# Patient Record
Sex: Male | Born: 1940 | Race: White | Hispanic: No | Smoking: Never smoker
Health system: Southern US, Community
[De-identification: ages and names within clinical notes are randomized; demographics above are authoritative.]

## PROBLEM LIST (undated history)

## (undated) ENCOUNTER — Ambulatory Visit: Admission: EM | Payer: Medicare HMO | Source: Home / Self Care

## (undated) DIAGNOSIS — I714 Abdominal aortic aneurysm, without rupture, unspecified: Secondary | ICD-10-CM

## (undated) DIAGNOSIS — L409 Psoriasis, unspecified: Secondary | ICD-10-CM

## (undated) DIAGNOSIS — IMO0001 Reserved for inherently not codable concepts without codable children: Secondary | ICD-10-CM

## (undated) DIAGNOSIS — F32A Depression, unspecified: Secondary | ICD-10-CM

## (undated) DIAGNOSIS — I499 Cardiac arrhythmia, unspecified: Secondary | ICD-10-CM

## (undated) DIAGNOSIS — I10 Essential (primary) hypertension: Secondary | ICD-10-CM

## (undated) DIAGNOSIS — Z87442 Personal history of urinary calculi: Secondary | ICD-10-CM

## (undated) DIAGNOSIS — E785 Hyperlipidemia, unspecified: Secondary | ICD-10-CM

## (undated) DIAGNOSIS — E538 Deficiency of other specified B group vitamins: Secondary | ICD-10-CM

## (undated) DIAGNOSIS — I77810 Thoracic aortic ectasia: Secondary | ICD-10-CM

## (undated) DIAGNOSIS — R0789 Other chest pain: Secondary | ICD-10-CM

## (undated) DIAGNOSIS — I4819 Other persistent atrial fibrillation: Secondary | ICD-10-CM

## (undated) DIAGNOSIS — I639 Cerebral infarction, unspecified: Secondary | ICD-10-CM

## (undated) DIAGNOSIS — I5023 Acute on chronic systolic (congestive) heart failure: Secondary | ICD-10-CM

## (undated) DIAGNOSIS — N210 Calculus in bladder: Secondary | ICD-10-CM

## (undated) DIAGNOSIS — K219 Gastro-esophageal reflux disease without esophagitis: Secondary | ICD-10-CM

## (undated) DIAGNOSIS — I428 Other cardiomyopathies: Secondary | ICD-10-CM

## (undated) DIAGNOSIS — R972 Elevated prostate specific antigen [PSA]: Secondary | ICD-10-CM

## (undated) DIAGNOSIS — G4730 Sleep apnea, unspecified: Secondary | ICD-10-CM

## (undated) DIAGNOSIS — N411 Chronic prostatitis: Secondary | ICD-10-CM

## (undated) DIAGNOSIS — N138 Other obstructive and reflux uropathy: Secondary | ICD-10-CM

## (undated) DIAGNOSIS — Z7901 Long term (current) use of anticoagulants: Secondary | ICD-10-CM

## (undated) DIAGNOSIS — N401 Enlarged prostate with lower urinary tract symptoms: Secondary | ICD-10-CM

## (undated) DIAGNOSIS — I700 Atherosclerosis of aorta: Secondary | ICD-10-CM

## (undated) DIAGNOSIS — I341 Nonrheumatic mitral (valve) prolapse: Secondary | ICD-10-CM

## (undated) DIAGNOSIS — F329 Major depressive disorder, single episode, unspecified: Secondary | ICD-10-CM

## (undated) DIAGNOSIS — I7143 Infrarenal abdominal aortic aneurysm, without rupture: Secondary | ICD-10-CM

## (undated) DIAGNOSIS — I2510 Atherosclerotic heart disease of native coronary artery without angina pectoris: Secondary | ICD-10-CM

## (undated) HISTORY — PX: HERNIA REPAIR: SHX51

## (undated) HISTORY — DX: Other persistent atrial fibrillation: I48.19

## (undated) HISTORY — DX: Gastro-esophageal reflux disease without esophagitis: K21.9

## (undated) HISTORY — PX: UMBILICAL HERNIA REPAIR: SHX196

## (undated) HISTORY — DX: Essential (primary) hypertension: I10

## (undated) HISTORY — DX: Psoriasis, unspecified: L40.9

## (undated) HISTORY — DX: Hyperlipidemia, unspecified: E78.5

## (undated) HISTORY — DX: Cerebral infarction, unspecified: I63.9

## (undated) HISTORY — DX: Deficiency of other specified B group vitamins: E53.8

## (undated) HISTORY — DX: Reserved for inherently not codable concepts without codable children: IMO0001

## (undated) HISTORY — DX: Elevated prostate specific antigen (PSA): R97.20

## (undated) HISTORY — PX: COLONOSCOPY WITH PROPOFOL: SHX5780

## (undated) HISTORY — PX: CATARACT EXTRACTION, BILATERAL: SHX1313

## (undated) HISTORY — DX: Other chest pain: R07.89

## (undated) HISTORY — DX: Nonrheumatic mitral (valve) prolapse: I34.1

## (undated) HISTORY — PX: TONSILLECTOMY: SUR1361

## (undated) HISTORY — PX: VARICOSE VEIN SURGERY: SHX832

---
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
## 1973-08-26 DIAGNOSIS — N400 Enlarged prostate without lower urinary tract symptoms: Secondary | ICD-10-CM | POA: Insufficient documentation

## 1973-08-26 DIAGNOSIS — I499 Cardiac arrhythmia, unspecified: Secondary | ICD-10-CM | POA: Insufficient documentation

## 2003-08-27 DIAGNOSIS — G4730 Sleep apnea, unspecified: Secondary | ICD-10-CM | POA: Insufficient documentation

## 2004-08-24 ENCOUNTER — Ambulatory Visit: Payer: Self-pay | Admitting: Gastroenterology

## 2006-07-13 ENCOUNTER — Emergency Department: Payer: Self-pay | Admitting: Emergency Medicine

## 2006-07-13 ENCOUNTER — Other Ambulatory Visit: Payer: Self-pay

## 2007-03-29 ENCOUNTER — Ambulatory Visit: Payer: Self-pay | Admitting: Ophthalmology

## 2007-03-29 ENCOUNTER — Other Ambulatory Visit: Payer: Self-pay

## 2008-07-03 ENCOUNTER — Inpatient Hospital Stay: Payer: Self-pay | Admitting: Rheumatology

## 2008-07-03 DIAGNOSIS — I6389 Other cerebral infarction: Secondary | ICD-10-CM

## 2008-07-03 HISTORY — DX: Other cerebral infarction: I63.89

## 2009-12-06 ENCOUNTER — Ambulatory Visit: Payer: Self-pay | Admitting: Urology

## 2010-01-18 ENCOUNTER — Ambulatory Visit: Payer: Self-pay | Admitting: Urology

## 2012-03-06 DIAGNOSIS — R3129 Other microscopic hematuria: Secondary | ICD-10-CM | POA: Insufficient documentation

## 2012-03-06 DIAGNOSIS — N200 Calculus of kidney: Secondary | ICD-10-CM | POA: Insufficient documentation

## 2012-03-06 DIAGNOSIS — R972 Elevated prostate specific antigen [PSA]: Secondary | ICD-10-CM | POA: Insufficient documentation

## 2012-03-06 DIAGNOSIS — N411 Chronic prostatitis: Secondary | ICD-10-CM | POA: Insufficient documentation

## 2012-03-06 DIAGNOSIS — N210 Calculus in bladder: Secondary | ICD-10-CM | POA: Insufficient documentation

## 2012-03-06 DIAGNOSIS — N401 Enlarged prostate with lower urinary tract symptoms: Secondary | ICD-10-CM | POA: Insufficient documentation

## 2012-03-06 DIAGNOSIS — R339 Retention of urine, unspecified: Secondary | ICD-10-CM | POA: Insufficient documentation

## 2012-06-12 ENCOUNTER — Ambulatory Visit: Payer: Self-pay | Admitting: Gastroenterology

## 2013-05-05 ENCOUNTER — Ambulatory Visit: Payer: Self-pay | Admitting: Cardiovascular Disease

## 2013-05-06 DIAGNOSIS — I4821 Permanent atrial fibrillation: Secondary | ICD-10-CM | POA: Insufficient documentation

## 2013-05-06 DIAGNOSIS — I639 Cerebral infarction, unspecified: Secondary | ICD-10-CM | POA: Insufficient documentation

## 2013-05-06 DIAGNOSIS — Z8673 Personal history of transient ischemic attack (TIA), and cerebral infarction without residual deficits: Secondary | ICD-10-CM | POA: Insufficient documentation

## 2013-05-08 ENCOUNTER — Encounter: Payer: Self-pay | Admitting: Cardiovascular Disease

## 2013-05-08 ENCOUNTER — Encounter (INDEPENDENT_AMBULATORY_CARE_PROVIDER_SITE_OTHER): Payer: Self-pay

## 2013-05-08 ENCOUNTER — Ambulatory Visit (INDEPENDENT_AMBULATORY_CARE_PROVIDER_SITE_OTHER): Payer: Medicare PPO | Admitting: Cardiovascular Disease

## 2013-05-08 VITALS — BP 118/70 | HR 94 | Ht 74.0 in | Wt 195.5 lb

## 2013-05-08 DIAGNOSIS — I6350 Cerebral infarction due to unspecified occlusion or stenosis of unspecified cerebral artery: Secondary | ICD-10-CM

## 2013-05-08 DIAGNOSIS — I10 Essential (primary) hypertension: Secondary | ICD-10-CM

## 2013-05-08 DIAGNOSIS — R0602 Shortness of breath: Secondary | ICD-10-CM

## 2013-05-08 DIAGNOSIS — I639 Cerebral infarction, unspecified: Secondary | ICD-10-CM

## 2013-05-08 DIAGNOSIS — E785 Hyperlipidemia, unspecified: Secondary | ICD-10-CM | POA: Insufficient documentation

## 2013-05-08 DIAGNOSIS — I4891 Unspecified atrial fibrillation: Secondary | ICD-10-CM

## 2013-05-08 MED ORDER — DILTIAZEM HCL ER COATED BEADS 120 MG PO CP24: 120.0000 mg | ORAL_CAPSULE | Freq: Every day | ORAL | Status: DC

## 2013-05-08 NOTE — Assessment & Plan Note (Addendum)
I'm concerned about poor heart rate control, particularly with exertion. Prior treadmill documenting peak heart rate in the low 200 range. He has shortness of breath with heavy exertion. He does not like atenolol. We have recommended he stop atenolol, start Cardizem 120 mg daily. I hope you will able to tolerate this without hypotension. His heart rate continues to be elevated, could add low-dose digoxin. Suspect he is permanent atrial fibrillation. We have recommended he closely monitor his heart rate and if he is in normal sinus rhythm, would recommend an EKG in our office. He's also interested in changing from xarelto to eliquis. He we'll check the price of the latter.

## 2013-05-08 NOTE — Assessment & Plan Note (Signed)
Most recent lipid panel not available. Encouraged him to stay on his simvastatin

## 2013-05-08 NOTE — Progress Notes (Signed)
Patient ID: Gary Dawson, male    DOB: 12/19/1940, 73 y.o.   MRN: [id number]  HPI Comments: Mr. Kustra is a pleasant 73 year old gentleman with long history of atrial fibrillation dating back to at least 2008, prior small stroke in June 2010, hyperlipidemia, who presents to establish care in the [place] office.   He reports that overall he feels well but is wondering if further medication changes could be made to make him feel better. His biggest complaint is shortness of breath when he exerts himself. He is able to walk quickly or run for 50 feet before he gets short of breath, tired. He feels that his legs are stuck in Jell-O when he exerts himself. Other than that, he reports that he is very active, does more than most people, has a high activity level and is not need much rest to recover. He denies any leg edema, no PND or orthopnea symptoms. He is relatively asymptomatic from his atrial fibrillation and is unable to tell when he is in normal rhythm or atrial fibrillation.  Notes indicate he was in atrial fibrillation in 2008 on EKG, normal sinus rhythm in June 2010, EKG February 2014 showing atrial fibrillation He has been on Xarelto  For least one year.   Treadmill stress echo March 2014 showed no wall motion abnormality concerning for ischemia, good exercise tolerance, peak heart rate of 214 beats per minute ? With peak systolic pressure 191/96. Resting heart rate 98 beats per minute.   Echocardiogram June 2010 showing ejection fraction greater than 55%, mildly dilated left atrium, mild LVH, mild to moderate TR, mild to moderate MR Carotid ultrasound June 2010 showing no significant plaque MRI of the brain June 2010 showing tiny nonhemorrhagic left posterior parietal lobe infarct  EKG today shows atrial fibrillation with rate 90-100 beats per minute, nonspecific ST abnormality, voltage concerning for LVH   Outpatient Encounter Prescriptions as of 05/08/2013  Medication Sig  .  AVODART 0.5 MG capsule Take 0.5 mg by mouth daily.   . Multiple Vitamin (MULTIVITAMIN) tablet Take 1 tablet by mouth daily.  Marland Kitchen omeprazole (PRILOSEC) 40 MG capsule Take 40 mg by mouth daily.  . simvastatin (ZOCOR) 40 MG tablet Take 40 mg by mouth daily at 6 PM.   . tamsulosin (FLOMAX) 0.4 MG CAPS capsule Take 0.4 mg by mouth.  [name] 20 MG TABS tablet Take 20 mg by mouth daily with supper.   Marland Kitchen  atenolol (TENORMIN) 25 MG tablet Take 25 mg by mouth daily.     Review of Systems  Constitutional: Negative.   HENT: Negative.   Eyes: Negative.   Respiratory: Negative.        Shortness of breath with exertion  Cardiovascular: Negative.   Gastrointestinal: Negative.   Endocrine: Negative.   Musculoskeletal: Negative.   Skin: Negative.   Allergic/Immunologic: Negative.   Neurological: Negative.   Hematological: Negative.   Psychiatric/Behavioral: Negative.   All other systems reviewed and are negative.   BP 118/70  Pulse 94  Ht 6\' 2"  (1.88 m)  Wt 195 lb 8 oz (88.678 kg)  BMI 25.09 kg/m2  Physical Exam  Nursing note and vitals reviewed. Constitutional: He is oriented to person, place, and time. He appears well-developed and well-nourished.  HENT:  Head: Normocephalic.  Nose: Nose normal.  Mouth/Throat: Oropharynx is clear and moist.  Eyes: Conjunctivae are normal. Pupils are equal, round, and reactive to light.  Neck: Normal range of motion. Neck supple. No JVD present.  Cardiovascular: S1 normal,  S2 normal, normal heart sounds and intact distal pulses.  An irregularly irregular rhythm present. Tachycardia present.  Exam reveals no gallop and no friction rub.   No murmur heard. Pulmonary/Chest: Effort normal and breath sounds normal. No respiratory distress. He has no wheezes. He has no rales. He exhibits no tenderness.  Abdominal: Soft. Bowel sounds are normal. He exhibits no distension. There is no tenderness.  Musculoskeletal: Normal range of motion. He exhibits no edema and  no tenderness.  Lymphadenopathy:    He has no cervical adenopathy.  Neurological: He is alert and oriented to person, place, and time. Coordination normal.  Skin: Skin is warm and dry. No rash noted. No erythema.  Psychiatric: He has a normal mood and affect. His behavior is normal. Judgment and thought content normal.      Assessment and Plan

## 2013-05-08 NOTE — Assessment & Plan Note (Addendum)
Recommended he closely monitor his blood pressure on Cardizem 120 mg daily. He will hold the atenolol

## 2013-05-08 NOTE — Patient Instructions (Addendum)
You are doing well.  Please check the price of the eliquis 5 mg twice a day Call the office if the price is the same or better  Please hold the atenolol Please start diltiazem 120 mg once a day Monitor your heart rate, call the office it runs high  Please call us if you have new issues that need to be addressed before your next appt.  Your physician wants you to follow-up in: 2 months.

## 2013-05-08 NOTE — Assessment & Plan Note (Signed)
We spent a long time discussing his symptoms of shortness of breath with exertion. Concerned his symptoms could be secondary to poorly controlled rate. We will work on controlling his heart rate first. Unable to exclude atrial fibrillation, the arrhythmia itself,  as a cause of his symptoms. Less likely ischemia. He is a nonsmoker, on cholesterol medication. Of symptoms persist or get worse, ischemia workup could be started.

## 2013-05-08 NOTE — Assessment & Plan Note (Signed)
Prior stroke. I understand he was not on anticoagulation at this time. He is currently on anticoagulation

## 2013-05-09 ENCOUNTER — Encounter: Payer: Self-pay | Admitting: Cardiovascular Disease

## 2013-05-15 ENCOUNTER — Telehealth: Payer: Self-pay

## 2013-05-15 NOTE — Telephone Encounter (Signed)
Left message for pt to call back  °

## 2013-05-15 NOTE — Telephone Encounter (Signed)
Pt has a question regarding his medications please call.

## 2013-05-16 NOTE — Telephone Encounter (Signed)
Message addressed via MyChart.

## 2013-05-22 DIAGNOSIS — E7800 Pure hypercholesterolemia, unspecified: Secondary | ICD-10-CM | POA: Insufficient documentation

## 2013-05-27 ENCOUNTER — Encounter: Payer: Self-pay | Admitting: Cardiovascular Disease

## 2013-07-09 ENCOUNTER — Ambulatory Visit (INDEPENDENT_AMBULATORY_CARE_PROVIDER_SITE_OTHER): Payer: Medicare PPO | Admitting: Cardiovascular Disease

## 2013-07-09 ENCOUNTER — Encounter: Payer: Self-pay | Admitting: Cardiovascular Disease

## 2013-07-09 VITALS — BP 122/80 | HR 111 | Ht 74.0 in | Wt 194.2 lb

## 2013-07-09 DIAGNOSIS — I10 Essential (primary) hypertension: Secondary | ICD-10-CM

## 2013-07-09 DIAGNOSIS — E785 Hyperlipidemia, unspecified: Secondary | ICD-10-CM

## 2013-07-09 DIAGNOSIS — I4891 Unspecified atrial fibrillation: Secondary | ICD-10-CM

## 2013-07-09 NOTE — Assessment & Plan Note (Signed)
Blood pressure is well controlled on today's visit. No changes made to the medications. 

## 2013-07-09 NOTE — Progress Notes (Signed)
Patient ID: Gary Dawson, male    DOB: Jul 06, 1940, 73 y.o.   MRN: [id number]  HPI Comments: [name] is a pleasant 73 year old gentleman with long history of atrial fibrillation dating back to at least 2008, prior small stroke in June 2010, hyperlipidemia, who presents  for routine followup.  In general he reports that he is doing very well. On his last clinic visit, we held the atenolol secondary to side effects and started diltiazem 120 mg daily. On this regimen he has felt well. Heart rate at home typically 70-80 beats per minute at rest. Blood pressure typically running 120-130 systolic. He has good energy, prior symptoms of fatigue with atenolol have resolved. He is starting to workout again. 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 Tolerating anticoagulation  Notes indicate he was in atrial fibrillation in 2008 on EKG, normal sinus rhythm in June 2010, EKG February 2014 showing atrial fibrillation He has been on Xarelto  For least one year.   Treadmill stress echo March 2014 showed no wall motion abnormality concerning for ischemia, good exercise tolerance, peak heart rate of 214 beats per minute ? With peak systolic pressure 191/96. Resting heart rate 98 beats per minute.   Echocardiogram June 2010 showing ejection fraction greater than 55%, mildly dilated left atrium, mild LVH, mild to moderate TR, mild to moderate MR Carotid ultrasound June 2010 showing no significant plaque MRI of the brain June 2010 showing tiny nonhemorrhagic left posterior parietal lobe infarct  EKG today shows atrial fibrillation with rate 111 eats per minute, no significant ST or T wave changes Heart rate did improve on clinical exam likely 80-90   Outpatient Encounter Prescriptions as of 07/09/2013  Medication Sig  . AVODART 0.5 MG capsule Take 0.5 mg by mouth daily.   Marland Kitchen. diltiazem (CARDIZEM CD) 120 MG 24 hr capsule Take 1 capsule (120 mg total) by mouth daily.  . Multiple Vitamin (MULTIVITAMIN) tablet Take  1 tablet by mouth daily.  . OLOPATADINE HCL OP Apply 665 mcg to eye as needed.  Marland Kitchen. omeprazole (PRILOSEC) 40 MG capsule Take 40 mg by mouth daily.  . simvastatin (ZOCOR) 40 MG tablet Take 40 mg by mouth daily at 6 PM.   . tamsulosin (FLOMAX) 0.4 MG CAPS capsule Take 0.4 mg by mouth.  [name]. XARELTO 20 MG TABS tablet Take 20 mg by mouth daily with supper.     Review of Systems  Constitutional: Negative.   HENT: Negative.   Eyes: Negative.   Respiratory: Negative.        Shortness of breath with exertion  Cardiovascular: Negative.   Gastrointestinal: Negative.   Endocrine: Negative.   Musculoskeletal: Negative.   Skin: Negative.   Allergic/Immunologic: Negative.   Neurological: Negative.   Hematological: Negative.   Psychiatric/Behavioral: Negative.   All other systems reviewed and are negative.   BP 122/80  Pulse 111  Ht 6\' 2"  (1.88 m)  Wt 194 lb 4 oz (88.111 kg)  BMI 24.93 kg/m2  Physical Exam  Nursing note and vitals reviewed. Constitutional: He is oriented to person, place, and time. He appears well-developed and well-nourished.  HENT:  Head: Normocephalic.  Nose: Nose normal.  Mouth/Throat: Oropharynx is clear and moist.  Eyes: Conjunctivae are normal. Pupils are equal, round, and reactive to light.  Neck: Normal range of motion. Neck supple. No JVD present.  Cardiovascular: S1 normal, S2 normal, normal heart sounds and intact distal pulses.  An irregularly irregular rhythm present. Tachycardia present.  Exam reveals no gallop  and no friction rub.   No murmur heard. Pulmonary/Chest: Effort normal and breath sounds normal. No respiratory distress. He has no wheezes. He has no rales. He exhibits no tenderness.  Abdominal: Soft. Bowel sounds are normal. He exhibits no distension. There is no tenderness.  Musculoskeletal: Normal range of motion. He exhibits no edema and no tenderness.  Lymphadenopathy:    He has no cervical adenopathy.  Neurological: He is alert and oriented  to person, place, and time. Coordination normal.  Skin: Skin is warm and dry. No rash noted. No erythema.  Psychiatric: He has a normal mood and affect. His behavior is normal. Judgment and thought content normal.      Assessment and Plan

## 2013-07-09 NOTE — Assessment & Plan Note (Signed)
Continues to be in chronic atrial fibrillation, rate reasonably well controlled. No medication changes made as he is happy with his activity level, heart rate at rest and with exertion. Diltiazem 30 mg pills were offered to him with exertion, he has declined

## 2013-07-09 NOTE — Assessment & Plan Note (Signed)
Encouraged him to stay on his simvastatin. 

## 2013-07-09 NOTE — Patient Instructions (Signed)
You are doing well. No medication changes were made.  Please call us if you have new issues that need to be addressed before your next appt.  Your physician wants you to follow-up in: 12 months.  You will receive a reminder letter in the mail two months in advance. If you don't receive a letter, please call our office to schedule the follow-up appointment. 

## 2013-11-04 DIAGNOSIS — D075 Carcinoma in situ of prostate: Secondary | ICD-10-CM | POA: Insufficient documentation

## 2013-11-04 DIAGNOSIS — G479 Sleep disorder, unspecified: Secondary | ICD-10-CM | POA: Insufficient documentation

## 2013-11-04 DIAGNOSIS — R350 Frequency of micturition: Secondary | ICD-10-CM | POA: Insufficient documentation

## 2013-11-21 ENCOUNTER — Ambulatory Visit: Payer: Self-pay | Admitting: Otolaryngology

## 2013-12-03 ENCOUNTER — Ambulatory Visit: Payer: Self-pay | Admitting: Otolaryngology

## 2014-03-12 ENCOUNTER — Other Ambulatory Visit: Payer: Self-pay | Admitting: *Deleted

## 2014-03-12 MED ORDER — RIVAROXABAN 20 MG PO TABS: 20.0000 mg | ORAL_TABLET | Freq: Every day | ORAL | Status: DC

## 2014-03-12 NOTE — Telephone Encounter (Signed)
[facility] w/ 90day supply

## 2014-03-20 ENCOUNTER — Other Ambulatory Visit: Payer: Self-pay

## 2014-03-20 MED ORDER — DILTIAZEM HCL ER COATED BEADS 120 MG PO CP24: 120.0000 mg | ORAL_CAPSULE | Freq: Every day | ORAL | Status: DC

## 2014-04-18 ENCOUNTER — Ambulatory Visit
Admit: 2014-04-18 | Disposition: A | Discharge status: Home / Self Care | Payer: Self-pay | Attending: Internal Medicine | Admitting: Internal Medicine

## 2014-06-14 ENCOUNTER — Other Ambulatory Visit: Payer: Self-pay | Admitting: Cardiovascular Disease

## 2014-06-18 MED ORDER — RIVAROXABAN 20 MG PO TABS: 20.0000 mg | ORAL_TABLET | Freq: Every day | ORAL | Status: DC

## 2014-06-18 NOTE — Addendum Note (Signed)
Addended by: Festus Aloe on: 06/18/2014 01:24 PM   Modules accepted: Orders

## 2014-06-18 NOTE — Telephone Encounter (Signed)
Refill sent for xarelto 20 mg for 90 day supply; patient has made a follow up appointment for June 2016.

## 2014-07-08 ENCOUNTER — Ambulatory Visit (INDEPENDENT_AMBULATORY_CARE_PROVIDER_SITE_OTHER): Payer: Medicare PPO | Admitting: Cardiovascular Disease

## 2014-07-08 ENCOUNTER — Encounter: Payer: Self-pay | Admitting: Cardiovascular Disease

## 2014-07-08 VITALS — BP 110/64 | HR 117 | Ht 74.0 in | Wt 198.5 lb

## 2014-07-08 DIAGNOSIS — I10 Essential (primary) hypertension: Secondary | ICD-10-CM

## 2014-07-08 DIAGNOSIS — M542 Cervicalgia: Secondary | ICD-10-CM | POA: Diagnosis not present

## 2014-07-08 DIAGNOSIS — I482 Chronic atrial fibrillation, unspecified: Secondary | ICD-10-CM

## 2014-07-08 NOTE — Assessment & Plan Note (Signed)
Blood pressure is well controlled on today's visit. No changes made to the medications. 

## 2014-07-08 NOTE — Assessment & Plan Note (Signed)
Elevated heart rate. Suggested he monitor his heart rate at home and call our office if this continues to run high He reports heart rate much improved at home compared to numbers today

## 2014-07-08 NOTE — Patient Instructions (Signed)
You are doing well. No medication changes were made.  Please call us if you have new issues that need to be addressed before your next appt.  Your physician wants you to follow-up in: 12 months.  You will receive a reminder letter in the mail two months in advance. If you don't receive a letter, please call our office to schedule the follow-up appointment. 

## 2014-07-08 NOTE — Progress Notes (Signed)
Patient ID: Gary Dawson, male    DOB: 1940/11/18, 74 y.o.   MRN: [id number]  HPI Comments: Gary Dawson is a pleasant 74 year old gentleman with long history of atrial fibrillation dating back to at least 2008, prior small stroke in June 2010, hyperlipidemia, who presents  for routine followup of his atrial fibrillation  In general he reports that he is doing very well.  Tolerating anticoagulation, taking diltiazem. No longer taking a statin. Prior cholesterol in 2015 was 100 Reports heart rate is well controlled at home.  Otherwise no other complaints  EKG on today's visit shows atrial fibrillation with ventricular rate 117 bpm, nonspecific ST abnormality  Other past medical history  in atrial fibrillation in 2008 on EKG, normal sinus rhythm in June 2010, EKG February 2014 showing atrial fibrillation  Treadmill stress echo March 2014 showed no wall motion abnormality concerning for ischemia, good exercise tolerance, peak heart rate of 214 beats per minute ? With peak systolic pressure 191/96. Resting heart rate 98 beats per minute.   Echocardiogram June 2010 showing ejection fraction greater than 55%, mildly dilated left atrium, mild LVH, mild to moderate TR, mild to moderate MR Carotid ultrasound June 2010 showing no significant plaque MRI of the brain June 2010 showing tiny nonhemorrhagic left posterior parietal lobe infarct  No Known Allergies  Current Outpatient Prescriptions on File Prior to Visit  Medication Sig Dispense Refill  . AVODART 0.5 MG capsule Take 0.5 mg by mouth daily.     Marland Kitchen diltiazem (CARDIZEM CD) 120 MG 24 hr capsule Take 1 capsule (120 mg total) by mouth daily. 90 capsule 3  . Multiple Vitamin (MULTIVITAMIN) tablet Take 1 tablet by mouth daily.    . OLOPATADINE HCL OP Apply 665 mcg to eye as needed.    . rivaroxaban (XARELTO) 20 MG TABS tablet Take 1 tablet (20 mg total) by mouth daily with supper. 90 tablet 0  . tamsulosin (FLOMAX) 0.4 MG CAPS capsule  Take 0.4 mg by mouth.     No current facility-administered medications on file prior to visit.    Past Medical History  Diagnosis Date  . Intermittent atrial fibrillation   . Psoriasis   . MVP (mitral valve prolapse)   . Reflux   . CVA (cerebral vascular accident)   . Hyperlipidemia   . Hypertension   . Vitamin B 12 deficiency   . Elevated PSA     Past Surgical History  Procedure Laterality Date  . Varicose vein surgery      Social History  reports that he has never smoked. He does not have any smokeless tobacco history on file. He reports that he drinks alcohol. He reports that he does not use illicit drugs.  Family History family history includes Heart attack in his father; Hyperlipidemia in his father; Hypertension in his father.     Review of Systems  Constitutional: Negative.   Respiratory: Negative.        Shortness of breath with exertion  Cardiovascular: Negative.   Gastrointestinal: Negative.   Musculoskeletal: Negative.   Skin: Negative.   Neurological: Negative.   Hematological: Negative.   Psychiatric/Behavioral: Negative.   All other systems reviewed and are negative.   BP 110/64 mmHg  Pulse 117  Ht 6\' 2"  (1.88 m)  Wt 198 lb 8 oz (90.039 kg)  BMI 25.48 kg/m2  Physical Exam  Constitutional: He is oriented to person, place, and time. He appears well-developed and well-nourished.  HENT:  Head: Normocephalic.  Nose: Nose normal.  Mouth/Throat: Oropharynx is clear and moist.  Eyes: Conjunctivae are normal. Pupils are equal, round, and reactive to light.  Neck: Normal range of motion. Neck supple. No JVD present.  Cardiovascular: S1 normal, S2 normal, normal heart sounds and intact distal pulses.  An irregularly irregular rhythm present. Tachycardia present.  Exam reveals no gallop and no friction rub.   No murmur heard. Pulmonary/Chest: Effort normal and breath sounds normal. No respiratory distress. He has no wheezes. He has no rales. He exhibits  no tenderness.  Abdominal: Soft. Bowel sounds are normal. He exhibits no distension. There is no tenderness.  Musculoskeletal: Normal range of motion. He exhibits no edema or tenderness.  Lymphadenopathy:    He has no cervical adenopathy.  Neurological: He is alert and oriented to person, place, and time. Coordination normal.  Skin: Skin is warm and dry. No rash noted. No erythema.  Psychiatric: He has a normal mood and affect. His behavior is normal. Judgment and thought content normal.      Assessment and Plan   Nursing note and vitals reviewed.

## 2014-09-01 ENCOUNTER — Other Ambulatory Visit: Payer: Self-pay

## 2014-09-01 MED ORDER — RIVAROXABAN 20 MG PO TABS: 20.0000 mg | ORAL_TABLET | Freq: Every day | ORAL | Status: DC

## 2014-09-01 NOTE — Telephone Encounter (Signed)
90 day supply

## 2014-10-30 ENCOUNTER — Encounter: Payer: Self-pay | Admitting: Nurse Practitioner

## 2014-10-30 ENCOUNTER — Telehealth: Payer: Self-pay | Admitting: Cardiovascular Disease

## 2014-10-30 ENCOUNTER — Ambulatory Visit (INDEPENDENT_AMBULATORY_CARE_PROVIDER_SITE_OTHER): Payer: Medicare PPO | Admitting: Nurse Practitioner

## 2014-10-30 VITALS — BP 100/58 | HR 119 | Ht 74.0 in | Wt 195.5 lb

## 2014-10-30 DIAGNOSIS — I481 Persistent atrial fibrillation: Secondary | ICD-10-CM | POA: Diagnosis not present

## 2014-10-30 DIAGNOSIS — I482 Chronic atrial fibrillation: Secondary | ICD-10-CM | POA: Diagnosis not present

## 2014-10-30 DIAGNOSIS — I4819 Other persistent atrial fibrillation: Secondary | ICD-10-CM

## 2014-10-30 DIAGNOSIS — R079 Chest pain, unspecified: Secondary | ICD-10-CM | POA: Diagnosis not present

## 2014-10-30 MED ORDER — DILTIAZEM HCL ER COATED BEADS 180 MG PO CP24
180.0000 mg | ORAL_CAPSULE | Freq: Every day | ORAL | Status: DC
Start: 2014-10-30 — End: 2014-12-17

## 2014-10-30 MED ORDER — DILTIAZEM HCL ER COATED BEADS 180 MG PO CP24: 180.0000 mg | ORAL_CAPSULE | Freq: Every day | ORAL | Status: DC

## 2014-10-30 NOTE — Telephone Encounter (Signed)
° °  1. Are you having CP right now? Yes, no sharpe pain  Feels Tightness just above left breast   2. Are you experiencing any other symptomsNo   3. How long have you been experiencing CP?  Start over the last few weeks - ? Cause got off medication routine cant get back on track   4. Is your CP continuous or coming and going?  Comes and goes   5. Have you taken Nitroglycerin?   No but relieved or subsides some with asa  ?

## 2014-10-30 NOTE — Telephone Encounter (Signed)
Gary Dawson has an opening.  Patient is coming in today at 2 pm.

## 2014-10-30 NOTE — Patient Instructions (Addendum)
Medication Instructions:  Your physician has recommended you make the following change in your medication:  INCREASE diltiazem  once per day   Labwork: none  Testing/Procedures: Your physician has requested that you have a lexiscan myoview. For further information please visit [email or web address] Please follow instruction sheet, as given.  [facility] MYOVIEW  Your caregiver has ordered a Stress Test with nuclear imaging. The purpose of this test is to evaluate the blood supply to your heart muscle. This procedure is referred to as a "Non-Invasive Stress Test." This is because other than having an IV started in your vein, nothing is inserted or "invades" your body. Cardiac stress tests are done to find areas of poor blood flow to the heart by determining the extent of coronary artery disease (CAD). Some patients exercise on a treadmill, which naturally increases the blood flow to your heart, while others who are  unable to walk on a treadmill due to physical limitations have a pharmacologic/chemical stress agent called Lexiscan . This medicine will mimic walking on a treadmill by temporarily increasing your coronary blood flow.   Please note: these test may take anywhere between 2-4 hours to complete  PLEASE REPORT TO [facility] ENTRANCE  THE VOLUNTEERS AT THE FIRST DESK WILL DIRECT YOU WHERE TO GO  Date of Procedure: Friday, October 21, 8:00am Arrival Time for Procedure: 7:45am  Instructions regarding medication: You may take your morning medications with a sip of water.   PLEASE NOTIFY THE OFFICE AT LEAST 24 HOURS IN ADVANCE IF YOU ARE UNABLE TO KEEP YOUR APPOINTMENT.  [phone] AND  PLEASE NOTIFY NUCLEAR MEDICINE AT [facility] AT LEAST 24 HOURS IN ADVANCE IF YOU ARE UNABLE TO KEEP YOUR APPOINTMENT. [phone]  How to prepare for your Myoview test:   Do not eat or drink after midnight  No caffeine for 24 hours prior to test  No smoking 24 hours prior to test.  Your  medication may be taken with water.  If your doctor stopped a medication because of this test, do not take that medication.  Ladies, please do not wear dresses.  Skirts or pants are appropriate. Please wear a short sleeve shirt.  No perfume, cologne or lotion.  Wear comfortable walking shoes. No heels!            Follow-Up: Your physician recommends that you schedule a follow-up appointment in: one month with Eula Listen or Dr. Mariah Milling   Any Other Special Instructions Will Be Listed Below (If Applicable).  Cardiac Nuclear Scanning A cardiac nuclear scan is used to check your heart for problems, such as the following:  A portion of the heart is not getting enough blood.  Part of the heart muscle has died, which happens with a heart attack.  The heart wall is not working normally.  In this test, a radioactive dye (tracer) is injected into your bloodstream. After the tracer has traveled to your heart, a scanning device is used to measure how much of the tracer is absorbed by or distributed to various areas of your heart. LET [facility] CARE PROVIDER KNOW ABOUT:  Any allergies you have.  All medicines you are taking, including vitamins, herbs, eye drops, creams, and over-the-counter medicines.  Previous problems you or members of your family have had with the use of anesthetics.  Any blood disorders you have.  Previous surgeries you have had.  Medical conditions you have.  RISKS AND COMPLICATIONS Generally, this is a safe procedure. However, as with any procedure, problems can  occur. Possible problems include:   Serious chest pain.  Rapid heartbeat.  Sensation of warmth in your chest. This usually passes quickly. BEFORE THE PROCEDURE Ask your health care provider about changing or stopping your regular medicines. PROCEDURE This procedure is usually done at a hospital and takes 2-4 hours.  An IV tube is inserted into one of your veins.  Your health care provider  will inject a small amount of radioactive tracer through the tube.  You will then wait for 20-40 minutes while the tracer travels through your bloodstream.  You will lie down on an exam table so images of your heart can be taken. Images will be taken for about 15-20 minutes.  You will exercise on a treadmill or stationary bike. While you exercise, your heart activity will be monitored with an electrocardiogram (ECG), and your blood pressure will be checked.  If you are unable to exercise, you may be given a medicine to make your heart beat faster.  When blood flow to your heart has peaked, tracer will again be injected through the IV tube.  After 20-40 minutes, you will get back on the exam table and have more images taken of your heart.  When the procedure is over, your IV tube will be removed. AFTER THE PROCEDURE  You will likely be able to leave shortly after the test. Unless your health care provider tells you otherwise, you may return to your normal schedule, including diet, activities, and medicines.  Make sure you find out how and when you will get your test results.   This information is not intended to replace advice given to you by your health care provider. Make sure you discuss any questions you have with your health care provider.   Document Released: 01/28/2004 Document Revised: 01/07/2013 Document Reviewed: 12/11/2012 Elsevier Interactive Patient Education [employer]

## 2014-10-30 NOTE — Progress Notes (Signed)
Patient Name: Gary Dawson Date of Encounter: 10/30/2014  Primary Care Provider:  Clydie Braun, MD Primary Cardiologist:  Concha Se, MD   Chief Complaint  74 y/o male with a h/o persistent afib who presents for f/u related to c/p.  Past Medical History   Past Medical History  Diagnosis Date  . Persistent atrial fibrillation (HCC)     a. CHA2DS2VASc = 4-->chronic Xarelto.  . Psoriasis   . MVP (mitral valve prolapse)   . Reflux   . CVA (cerebral vascular accident) (HCC)   . Hyperlipidemia   . Essential hypertension   . Vitamin B 12 deficiency   . Elevated PSA   . Atypical chest pain     a. 03/2012 St echo: nl EF, no wma's.   Past Surgical History  Procedure Laterality Date  . Varicose vein surgery      Allergies  No Known Allergies  HPI  74 y/o male with a h/o persistent AF on dilt/xarelto.  He does not know what his HR trends @ home but denies palpitations or significant limitations related to his AF.  Over the past month and a half, he has had 3 episodes of mild (3-4/10) left sided chest discomfort associated with mild dyspnea, all occurring @ rest, and lasting a few hours before lessening in severity to 1-2/10.  Discomfort can last @ 1-2/10 for several days.  Despite prolonged symptoms, he is able to remain very active, working outside for hours on end without any worsening of c/p or dyspnea.  He has never had onset of Ss during activity.  His most recent episode occurred about 4 days ago and resolved by the next day.  He has been pain free since, despite remaining active.  He denies pnd, orthopnea, n, v, dizziness, syncope, edema, weight gain, or early satiety.   Home Medications  Prior to Admission medications   Medication Sig Start Date End Date Taking? Authorizing Provider  AVODART 0.5 MG capsule Take 0.5 mg by mouth Gary.  03/21/13  Yes Historical Provider, MD  hydroxypropyl methylcellulose / hypromellose (ISOPTO TEARS / GONIOVISC) 2.5 % ophthalmic  solution 1 drop as needed for dry eyes.   Yes Historical Provider, MD  OLOPATADINE HCL OP Apply 665 mcg to eye as needed.   Yes Historical Provider, MD  rivaroxaban (XARELTO) 20 MG TABS tablet Take 1 tablet (20 mg total) by mouth Gary with supper. 09/01/14  Yes Antonieta Iba, MD  tamsulosin (FLOMAX) 0.4 MG CAPS capsule Take 0.4 mg by mouth.   Yes Historical Provider, MD  diltiazem (CARDIZEM CD) 180 MG 24 hr capsule Take 1 capsule (180 mg total) by mouth Gary. 10/30/14   Ok Anis, NP    Review of Systems  Left sided chest discomfort and mild dyspnea as outlined above.  All other systems reviewed and are otherwise negative except as noted above.  Physical Exam  VS:  BP 112/58 mmHg  Pulse 119  Ht  (1.88 m)  Wt 195 lb 8 oz (88.678 kg)  BMI 25.09 kg/m2 , BMI Body mass index is 25.09 kg/(m^2). GEN: Well nourished, well developed, in no acute distress. HEENT: normal. Neck: Supple, no JVD, carotid bruits, or masses. Cardiac: IR, IR, tachy, no murmurs, rubs, or gallops. No clubbing, cyanosis, edema.  Radials/DP/PT 2+ and equal bilaterally.  Respiratory:  Respirations regular and unlabored, clear to auscultation bilaterally. GI: Soft, nontender, nondistended, BS + x 4. MS: no deformity or atrophy. Skin: warm and dry, no rash. Neuro:  Strength and sensation are intact. Psych: Normal affect.  Accessory Clinical Findings  ECG - AF, 119, nonspecific t changes - no acute changes.  Assessment & Plan  1.  Left sided chest discomfort:  Pt presents after experiencing 3 isolated episodes of left sided c/p, all occurring @ rest and lasting several hrs prior to lessening in severity, with mild persistent Ss for several days.  Discomfort has not limited his activity any and in fact, he has never had onset of Ss with exertion despite being very active.  Last episode occurred ~ 4 days ago.  ECG is unchanged from prior recordings.  I will arrange for an exercise cardiolite to r/o  ischemia.  2.  Persistent Atrial Fibrillation:  Rate is poorly controlled.  He was in the 120's when seen in June and was advised at that time to follow his HR's @ home and report back to [place].  He has not been doing this.  HR is 119 today.  His BP is on the low side but I think it's worth trying to push his dilt CD to 180 mg Gary.  He does have a cuff @ home and will check his BP.  If rates continue to run > 100, we should plan to add digoxin next.  Cont xarelto.  He sometimes misses doses.  We discussed the importance of not missing doses.  3.  Dispo:  F/u cardiolite as above.  F/u here in 1 month to reassess rate.    Nicolasa Ducking, NP 10/30/2014, 2:40 PM

## 2014-11-03 ENCOUNTER — Telehealth: Payer: Self-pay | Admitting: Cardiovascular Disease

## 2014-11-03 ENCOUNTER — Other Ambulatory Visit: Payer: Self-pay | Admitting: *Deleted

## 2014-11-03 MED ORDER — RIVAROXABAN 20 MG PO TABS: 20.0000 mg | ORAL_TABLET | Freq: Every day | ORAL | Status: DC

## 2014-11-03 NOTE — Telephone Encounter (Signed)
Xarelto 20 mg #30 R#3 sent to local pharmacy.

## 2014-11-03 NOTE — Telephone Encounter (Signed)
° °  STAT if patient is at the pharmacy , call can be transferred to refill team.   1. Which medications need to be refilled? Xarelto 20 mg PO Daily with Supper  2. Which pharmacy/location is medication to be sent to?   [facility]   3. Do they need a 30 day or 90 day supply? 30   THIS IS RX TO GET PATIENT THROUGH UNTIL PATIENT ORDERS THROUGH [facility] .

## 2014-11-06 ENCOUNTER — Ambulatory Visit
Admission: RE | Admit: 2014-11-06 | Discharge: 2014-11-06 | Disposition: A | Discharge status: Home / Self Care | Payer: Medicare PPO | Source: Ambulatory Visit | Attending: Nurse Practitioner | Admitting: Nurse Practitioner

## 2014-11-06 DIAGNOSIS — R079 Chest pain, unspecified: Secondary | ICD-10-CM | POA: Diagnosis not present

## 2014-11-06 LAB — NM MYOCAR MULTI W/SPECT W/WALL MOTION / EF
CHL CUP NUCLEAR SDS: 0
CHL CUP RESTING HR STRESS: 112 {beats}/min
LV sys vol: 78 mL
LVDIAVOL: 144 mL
Peak HR: 122 {beats}/min
Percent HR: 82 %
SRS: 9
SSS: 9
TID: 0.98

## 2014-11-06 MED ORDER — REGADENOSON 0.4 MG/5ML IV SOLN
0.4000 mg | Freq: Once | INTRAVENOUS | Status: AC
  Administered 2014-11-06: 0.4 mg via INTRAVENOUS

## 2014-11-06 MED ORDER — TECHNETIUM TC 99M SESTAMIBI GENERIC - CARDIOLITE
31.6600 | Freq: Once | INTRAVENOUS | Status: AC | PRN
  Administered 2014-11-06: 31.66 via INTRAVENOUS

## 2014-11-06 MED ORDER — TECHNETIUM TC 99M SESTAMIBI - CARDIOLITE
14.2900 | Freq: Once | INTRAVENOUS | Status: AC | PRN
  Administered 2014-11-06: 14.29 via INTRAVENOUS

## 2014-11-13 ENCOUNTER — Ambulatory Visit (INDEPENDENT_AMBULATORY_CARE_PROVIDER_SITE_OTHER): Payer: Medicare PPO | Admitting: Cardiovascular Disease

## 2014-11-13 ENCOUNTER — Encounter: Payer: Self-pay | Admitting: Cardiovascular Disease

## 2014-11-13 VITALS — BP 122/80 | HR 85 | Ht 74.0 in | Wt 197.0 lb

## 2014-11-13 DIAGNOSIS — E785 Hyperlipidemia, unspecified: Secondary | ICD-10-CM | POA: Diagnosis not present

## 2014-11-13 DIAGNOSIS — I10 Essential (primary) hypertension: Secondary | ICD-10-CM

## 2014-11-13 DIAGNOSIS — I481 Persistent atrial fibrillation: Secondary | ICD-10-CM | POA: Diagnosis not present

## 2014-11-13 DIAGNOSIS — R0602 Shortness of breath: Secondary | ICD-10-CM

## 2014-11-13 DIAGNOSIS — Z7189 Other specified counseling: Secondary | ICD-10-CM | POA: Insufficient documentation

## 2014-11-13 DIAGNOSIS — I4819 Other persistent atrial fibrillation: Secondary | ICD-10-CM

## 2014-11-13 MED ORDER — FUROSEMIDE 20 MG PO TABS: 20.0000 mg | ORAL_TABLET | Freq: Every day | ORAL | Status: DC | PRN

## 2014-11-13 MED ORDER — POTASSIUM CHLORIDE ER 10 MEQ PO TBCR: 10.0000 meq | EXTENDED_RELEASE_TABLET | Freq: Every day | ORAL | Status: DC

## 2014-11-13 MED ORDER — PROPRANOLOL HCL 20 MG PO TABS: 20.0000 mg | ORAL_TABLET | Freq: Three times a day (TID) | ORAL | Status: DC | PRN

## 2014-11-13 NOTE — Assessment & Plan Note (Signed)
Other forms of anticoagulation discussed with him. He is happy to stay on Xarelto for now. Other options would be to change to eliquis given the strong data

## 2014-11-13 NOTE — Assessment & Plan Note (Signed)
Blood pressure well controlled on diltiazem No further changes at this time

## 2014-11-13 NOTE — Assessment & Plan Note (Signed)
Shortness of breath likely secondary to atrial fibrillation with elevated rate, possible fluid retention. Less likely ischemia. Recommended he take Lasix as needed with potassium for notable symptoms

## 2014-11-13 NOTE — Assessment & Plan Note (Signed)
Heart rate continues to be mildly elevated on clinical exam He does not want to increase his diltiazem  at this time as he was recently changed to the 180 mg dose up from 120 mg.  Recommended he continue to monitor his heart rate at home . If this continues to run high, could either increase the diltiazem, or add low-dose metoprolol succinate, alternatively could add digoxin if blood pressure runs low .

## 2014-11-13 NOTE — Assessment & Plan Note (Signed)
Cholesterol well controlled, no statin needed

## 2014-11-13 NOTE — Patient Instructions (Addendum)
You are doing well.  Take lasix and potassium (or banana) as needed for shortness of breath  Propranolol as needed for episodes of tachycardia  Please call if you have chest pain symptoms concerning for blockage   Please call us if you have new issues that need to be addressed before your next appt.  Your physician wants you to follow-up in: 6 months.  You will receive a reminder letter in the mail two months in advance. If you don't receive a letter, please call our office to schedule the follow-up appointment.  Chest Pain Observation It is often hard to give a specific diagnosis for the cause of chest pain. Among other possibilities your symptoms might be caused by inadequate oxygen delivery to your heart (angina). Angina that is not treated or evaluated can lead to a heart attack (myocardial infarction) or death. Blood tests, electrocardiograms, and X-rays may have been done to help determine a possible cause of your chest pain. After evaluation and observation, your health care provider has determined that it is unlikely your pain was caused by an unstable condition that requires hospitalization. However, a full evaluation of your pain may need to be completed, with additional diagnostic testing as directed. It is very important to keep your follow-up appointments. Not keeping your follow-up appointments could result in permanent heart damage, disability, or death. If there is any problem keeping your follow-up appointments, you must call your health care provider. HOME CARE INSTRUCTIONS  Due to the slight chance that your pain could be angina, it is important to follow your health care provider's treatment plan and also maintain a healthy lifestyle:  Maintain or work toward achieving a healthy weight.  Stay physically active and exercise regularly.  Decrease your salt intake.  Eat a balanced, healthy diet. Talk to a dietitian to learn about heart-healthy foods.  Increase your fiber  intake by including whole grains, vegetables, fruits, and nuts in your diet.  Avoid situations that cause stress, anger, or depression.  Take medicines as advised by your health care provider. Report any side effects to your health care provider. Do not stop medicines or adjust the dosages on your own.  Quit smoking. Do not use nicotine patches or gum until you check with your health care provider.  Keep your blood pressure, blood sugar, and cholesterol levels within normal limits.  Limit alcohol intake to no more than 1 drink per day for women who are not pregnant and 2 drinks per day for men.  Do not abuse drugs. SEEK IMMEDIATE MEDICAL CARE IF: You have severe chest pain or pressure which may include symptoms such as:  You feel pain or pressure in your arms, neck, jaw, or back.  You have severe back or abdominal pain, feel sick to your stomach (nauseous), or throw up (vomit).  You are sweating profusely.  You are having a fast or irregular heartbeat.  You feel short of breath while at rest.  You notice increasing shortness of breath during rest, sleep, or with activity.  You have chest pain that does not get better after rest or after taking your usual medicine.  You wake from sleep with chest pain.  You are unable to sleep because you cannot breathe.  You develop a frequent cough or you are coughing up blood.  You feel dizzy, faint, or experience extreme fatigue.  You develop severe weakness, dizziness, fainting, or chills. Any of these symptoms may represent a serious problem that is an emergency. Do not wait  to see if the symptoms will go away. Call your local emergency services (911 in the [place] Do not drive yourself to the hospital. MAKE SURE YOU:  Understand these instructions.  Will watch your condition.  Will get help right away if you are not doing well or get worse.   This information is not intended to replace advice given to you by your health care  provider. Make sure you discuss any questions you have with your health care provider.   Document Released: 02/04/2010 Document Revised: 01/07/2013 Document Reviewed: 07/04/2012 Elsevier Interactive Patient Education 2016 [employer] Nonspecific Tachycardia Tachycardia is a faster than normal heartbeat (more than 100 beats per minute). In adults, the heart normally beats between 60 and 100 times a minute. A fast heartbeat may be a normal response to exercise or stress. It does not necessarily mean that something is wrong. However, sometimes when your heart beats too fast it may not be able to pump enough blood to the rest of your body. This can result in chest pain, shortness of breath, dizziness, and even fainting. Nonspecific tachycardia means that the specific cause or pattern of your tachycardia is unknown. CAUSES  Tachycardia may be harmless or it may be due to a more serious underlying cause. Possible causes of tachycardia include:  Exercise or exertion.  Fever.  Pain or injury.  Infection.  Loss of body fluids (dehydration).  Overactive thyroid.  Lack of red blood cells (anemia).  Anxiety and stress.  Alcohol.  Caffeine.  Tobacco products.  Diet pills.  Illegal drugs.  Heart disease. SYMPTOMS  Rapid or irregular heartbeat (palpitations).  Suddenly feeling your heart beating (cardiac awareness).  Dizziness.  Tiredness (fatigue).  Shortness of breath.  Chest pain.  Nausea.  Fainting. DIAGNOSIS  Your caregiver will perform a physical exam and take your medical history. In some cases, a heart specialist (cardiologist) may be consulted. Your caregiver may also order:  Blood tests.  Electrocardiography. This test records the electrical activity of your heart.  A heart monitoring test. TREATMENT  Treatment will depend on the likely cause of your tachycardia. The goal is to treat the underlying cause of your tachycardia. Treatment methods may  include:  Replacement of fluids or blood through an intravenous (IV) tube for moderate to severe dehydration or anemia.  New medicines or changes in your current medicines.  Diet and lifestyle changes.  Treatment for certain infections.  Stress relief or relaxation methods. HOME CARE INSTRUCTIONS   Rest.  Drink enough fluids to keep your urine clear or pale yellow.  Do not smoke.  Avoid:  Caffeine.  Tobacco.  Alcohol.  Chocolate.  Stimulants such as over-the-counter diet pills or pills that help you stay awake.  Situations that cause anxiety or stress.  Illegal drugs such as marijuana, phencyclidine (PCP), and cocaine.  Only take medicine as directed by your caregiver.  Keep all follow-up appointments as directed by your caregiver. SEEK IMMEDIATE MEDICAL CARE IF:   You have pain in your chest, upper arms, jaw, or neck.  You become weak, dizzy, or feel faint.  You have palpitations that will not go away.  You vomit, have diarrhea, or pass blood in your stool.  Your skin is cool, pale, and wet.  You have a fever that will not go away with rest, fluids, and medicine. MAKE SURE YOU:   Understand these instructions.  Will watch your condition.  Will get help right away if you are not doing well or get worse.  This information is not intended to replace advice given to you by your health care provider. Make sure you discuss any questions you have with your health care provider.   Document Released: 02/10/2004 Document Revised: 03/27/2011 Document Reviewed: 07/17/2014 Elsevier Interactive Patient Education [employer]

## 2014-11-13 NOTE — Progress Notes (Signed)
Patient ID: Gary Dawson, male    DOB: [birth year], 74 y.o.   MRN: [id number]  HPI Comments: Gary Dawson is a pleasant 74 year old gentleman with long history of atrial fibrillation dating back to at least 2008, prior small stroke in June 2010, hyperlipidemia, who presents  for routine followup of his atrial fibrillation  Recently seen by our office for chest pain symptoms that were presenting at rest Diltiazem increased up to 180 mg daily for heart rate control He had a stress test, perfusion Myoview. This showed small region of perfusion defect, predominantly fixed in the inferoapical region. Low ejection fraction 35-40%, suspect secondary to conduction abnormality  On today's visit, he reports that he feels much better. Denies any chest pain symptoms He is active, takes care of several yards and their gardening. Still some shortness of breath with deep inspiration Total cholesterol 120 on no statin  Other past medical history  in atrial fibrillation in 2008 on EKG, normal sinus rhythm in June 2010, EKG February 2014 showing atrial fibrillation  Treadmill stress echo March 2014 showed no wall motion abnormality concerning for ischemia, good exercise tolerance, peak heart rate of 214 beats per minute ? With peak systolic pressure 191/96. Resting heart rate 98 beats per minute.   Echocardiogram June 2010 showing ejection fraction greater than 55%, mildly dilated left atrium, mild LVH, mild to moderate TR, mild to moderate MR Carotid ultrasound June 2010 showing no significant plaque MRI of the brain June 2010 showing tiny nonhemorrhagic left posterior parietal lobe infarct  No Known Allergies  Current Outpatient Prescriptions on File Prior to Visit  Medication Sig Dispense Refill  . AVODART 0.5 MG capsule Take 0.5 mg by mouth daily.     Marland Kitchen diltiazem (CARDIZEM CD) 180 MG 24 hr capsule Take 1 capsule (180 mg total) by mouth daily. 30 capsule 0  . hydroxypropyl methylcellulose /  hypromellose (ISOPTO TEARS / GONIOVISC) 2.5 % ophthalmic solution 1 drop as needed for dry eyes.    . OLOPATADINE HCL OP Apply 665 mcg to eye as needed.    . rivaroxaban (XARELTO) 20 MG TABS tablet Take 1 tablet (20 mg total) by mouth daily with supper. 30 tablet 3  . tamsulosin (FLOMAX) 0.4 MG CAPS capsule Take 0.4 mg by mouth.     No current facility-administered medications on file prior to visit.    Past Medical History  Diagnosis Date  . Persistent atrial fibrillation (HCC)     a. CHA2DS2VASc = 4-->chronic Xarelto.  . Psoriasis   . MVP (mitral valve prolapse)   . Reflux   . CVA (cerebral vascular accident) (HCC)   . Hyperlipidemia   . Essential hypertension   . Vitamin B 12 deficiency   . Elevated PSA   . Atypical chest pain     a. 03/2012 St echo: nl EF, no wma's.    Past Surgical History  Procedure Laterality Date  . Varicose vein surgery      Social History  reports that he has never smoked. He does not have any smokeless tobacco history on file. He reports that he drinks alcohol. He reports that he does not use illicit drugs.  Family History family history includes Heart attack in his father; Hyperlipidemia in his father; Hypertension in his father.   Review of Systems  Constitutional: Negative.   Respiratory: Positive for shortness of breath.        Shortness of breath with exertion  Cardiovascular: Negative.   Gastrointestinal: Negative.   Musculoskeletal:  Negative.   Skin: Negative.   Neurological: Negative.   Hematological: Negative.   Psychiatric/Behavioral: Negative.   All other systems reviewed and are negative.   BP 122/80 mmHg  Pulse 85  Ht 6\' 2"  (1.88 m)  Wt 197 lb (89.359 kg)  BMI 25.28 kg/m2  SpO2 99%  Physical Exam  Constitutional: He is oriented to person, place, and time. He appears well-developed and well-nourished.  HENT:  Head: Normocephalic.  Nose: Nose normal.  Mouth/Throat: Oropharynx is clear and moist.  Eyes: Conjunctivae  are normal. Pupils are equal, round, and reactive to light.  Neck: Normal range of motion. Neck supple. No JVD present.  Cardiovascular: S1 normal, S2 normal, normal heart sounds and intact distal pulses.  An irregularly irregular rhythm present. Tachycardia present.  Exam reveals no gallop and no friction rub.   No murmur heard. Pulmonary/Chest: Effort normal and breath sounds normal. No respiratory distress. He has no wheezes. He has no rales. He exhibits no tenderness.  Abdominal: Soft. Bowel sounds are normal. He exhibits no distension. There is no tenderness.  Musculoskeletal: Normal range of motion. He exhibits no edema or tenderness.  Lymphadenopathy:    He has no cervical adenopathy.  Neurological: He is alert and oriented to person, place, and time. Coordination normal.  Skin: Skin is warm and dry. No rash noted. No erythema.  Psychiatric: He has a normal mood and affect. His behavior is normal. Judgment and thought content normal.      Assessment and Plan   Nursing note and vitals reviewed.

## 2014-12-04 ENCOUNTER — Ambulatory Visit: Payer: Medicare PPO | Admitting: Physician Assistant

## 2014-12-17 ENCOUNTER — Other Ambulatory Visit: Payer: Self-pay

## 2014-12-17 MED ORDER — DILTIAZEM HCL ER COATED BEADS 180 MG PO CP24: 180.0000 mg | ORAL_CAPSULE | Freq: Every day | ORAL | Status: DC

## 2014-12-17 NOTE — Telephone Encounter (Signed)
1 year supply in 90 day increments

## 2014-12-31 ENCOUNTER — Ambulatory Visit (INDEPENDENT_AMBULATORY_CARE_PROVIDER_SITE_OTHER): Payer: Medicare PPO

## 2014-12-31 ENCOUNTER — Ambulatory Visit
Admission: EM | Admit: 2014-12-31 | Discharge: 2014-12-31 | Disposition: A | Discharge status: Home / Self Care | Payer: Medicare PPO | Attending: Family Medicine | Admitting: Family Medicine

## 2014-12-31 DIAGNOSIS — M436 Torticollis: Secondary | ICD-10-CM

## 2014-12-31 DIAGNOSIS — M5030 Other cervical disc degeneration, unspecified cervical region: Secondary | ICD-10-CM

## 2014-12-31 MED ORDER — DIAZEPAM 2 MG PO TABS: 2.0000 mg | ORAL_TABLET | Freq: Three times a day (TID) | ORAL | Status: DC

## 2014-12-31 NOTE — Discharge Instructions (Signed)
Acute Torticollis °Torticollis is a condition in which the muscles of the neck tighten (contract) abnormally, causing the neck to twist and the head to move into an unnatural position. Torticollis that develops suddenly is called acute torticollis. If torticollis becomes chronic and is left untreated, the face and neck can become deformed. °CAUSES °This condition may be caused by: °· Sleeping in an awkward position (common). °· Extending or twisting the neck muscles beyond their normal position. °· Infection. °In some cases, the cause may not be known. °SYMPTOMS °Symptoms of this condition include: °· An unnatural position of the head. °· Neck pain. °· A limited ability to move the neck. °· Twisting of the neck to one side. °DIAGNOSIS °This condition is diagnosed with a physical exam. You may also have imaging tests, such as an X-ray, CT scan, or MRI. °TREATMENT °Treatment for this condition involves trying to relax the neck muscles. It may include: °· Medicines or shots. °· Physical therapy. °· Surgery. This may be done in severe cases. °HOME CARE INSTRUCTIONS °· Take medicines only as directed by your health care provider. °· Do stretching exercises and massage your neck as directed by your health care provider. °· Keep all follow-up visits as directed by your health care provider. This is important. °SEEK MEDICAL CARE IF: °· You develop a fever. °SEEK IMMEDIATE MEDICAL CARE IF: °· You develop difficulty breathing. °· You develop noisy breathing (stridor). °· You start drooling. °· You have trouble swallowing or have pain with swallowing. °· You develop numbness or weakness in your hands or feet. °· You have changes in your speech, understanding, or vision. °· Your pain gets worse. °  °This information is not intended to replace advice given to you by your health care provider. Make sure you discuss any questions you have with your health care provider. °  °Document Released: 12/31/1999 Document Revised:  05/19/2014 Document Reviewed: 12/29/2013 °Elsevier Interactive Patient Education ©2016 Elsevier Inc. ° °Degenerative Disk Disease °Degenerative disk disease is a condition caused by the changes that occur in spinal disks as you grow older. Spinal disks are soft and compressible disks located between the bones of your spine (vertebrae). These disks act like shock absorbers. Degenerative disk disease can affect the whole spine. However, the neck and lower back are most commonly affected. Many changes can occur in the spinal disks with aging, such as: °· The spinal disks may dry and shrink. °· Small tears may occur in the tough, outer covering of the disk (annulus). °· The disk space may become smaller due to loss of water. °· Abnormal growths in the bone (spurs) may occur. This can put pressure on the nerve roots exiting the spinal canal, causing pain. °· The spinal canal may become narrowed. °RISK FACTORS  °· Being overweight. °· Having a family history of degenerative disk disease. °· Smoking. °· There is increased risk if you are doing heavy lifting or have a sudden injury. °SIGNS AND SYMPTOMS  °Symptoms vary from person to person and may include: °· Pain that varies in intensity. Some people have no pain, while others have severe pain. The location of the pain depends on the part of your backbone that is affected. °¨ You will have neck or arm pain if a disk in the neck area is affected. °¨ You will have pain in your back, buttocks, or legs if a disk in the lower back is affected. °· Pain that becomes worse while bending, reaching up, or with twisting movements. °·   Pain that may start gradually and then get worse as time passes. It may also start after a major or minor injury.  Numbness or tingling in the arms or legs. DIAGNOSIS  Your health care provider will ask you about your symptoms and about activities or habits that may cause the pain. He or she may also ask about any injuries, diseases, or treatments  you have had. Your health care provider will examine you to check for the range of movement that is possible in the affected area, to check for strength in your extremities, and to check for sensation in the areas of the arms and legs supplied by different nerve roots. You may also have:   An X-ray of the spine.  Other imaging tests, such as MRI. TREATMENT  Your health care provider will advise you on the best plan for treatment. Treatment may include:  Medicines.  Rehabilitation exercises. HOME CARE INSTRUCTIONS   Follow proper lifting and walking techniques as advised by your health care provider.  Maintain good posture.  Exercise regularly as advised by your health care provider.  Perform relaxation exercises.  Change your sitting, standing, and sleeping habits as advised by your health care provider.  Change positions frequently.  Lose weight or maintain a healthy weight as advised by your health care provider.  Do not use any tobacco products, including cigarettes, chewing tobacco, or electronic cigarettes. If you need help quitting, ask your health care provider.  Wear supportive footwear.  Take medicines only as directed by your health care provider. SEEK MEDICAL CARE IF:   Your pain does not go away within 1-4 weeks.  You have significant appetite or weight loss. SEEK IMMEDIATE MEDICAL CARE IF:   Your pain is severe.  You notice weakness in your arms, hands, or legs.  You begin to lose control of your bladder or bowel movements.  You have fevers or night sweats. MAKE SURE YOU:   Understand these instructions.  Will watch your condition.  Will get help right away if you are not doing well or get worse.   This information is not intended to replace advice given to you by your health care provider. Make sure you discuss any questions you have with your health care provider.   Document Released: 10/30/2006 Document Revised: 01/23/2014 Document Reviewed:  05/06/2013 Elsevier Interactive Patient Education [employer]

## 2014-12-31 NOTE — ED Provider Notes (Signed)
CSN: [id number]     Arrival date & time 12/31/14  8110 History   First MD Initiated Contact with Patient 12/31/14 [phone]     Chief Complaint  Patient presents with  . Neck Pain   (Consider location/radiation/quality/duration/timing/severity/associated sxs/prior Treatment) HPI   This 74 year old male who presents with take in the occipital area three-week history of severe neck pain. His had does previous history of neck injuries in the past as the from of positional changes as he is not able to sleep at night from insomnia and obstructive sleep apnea and spends many nights awake any bugs or watching TV in certain positions. Today he presents with a cervical collar is been using for her. Short periods of time and has not worked chronically. He denies any upper extremity radicular symptoms. Has no incontinence. 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 He has chronic atrial fibrillation and is on xeralto chronically which would limit any use of nonsteroidal anti-inflammatory drugs.   Past Medical History  Diagnosis Date  . Persistent atrial fibrillation (HCC)     a. CHA2DS2VASc = 4-->chronic Xarelto.  . Psoriasis   . MVP (mitral valve prolapse)   . Reflux   . CVA (cerebral vascular accident) (HCC)   . Hyperlipidemia   . Essential hypertension   . Vitamin B 12 deficiency   . Elevated PSA   . Atypical chest pain     a. 03/2012 St echo: nl EF, no wma's.   Past Surgical History  Procedure Laterality Date  . Varicose vein surgery     Family History  Problem Relation Age of Onset  . Heart attack Father   . Hypertension Father   . Hyperlipidemia Father    Social History  Substance Use Topics  . Smoking status: Never Smoker   . Smokeless tobacco: None  . Alcohol Use: Yes     Comment: social     Review of Systems  Constitutional: Positive for activity change. Negative for fever, chills, appetite change and fatigue.   Musculoskeletal: Positive for myalgias and neck pain.  All other systems reviewed and are negative.   Allergies  Review of patient's allergies indicates no known allergies.  Home Medications   Prior to Admission medications   Medication Sig Start Date End Date Taking? Authorizing Provider  AVODART 0.5 MG capsule Take 0.5 mg by mouth daily.  03/21/13   Historical Provider, MD  diazepam (VALIUM) 2 MG tablet Take 1 tablet (2 mg total) by mouth 3 (three) times daily. 12/31/14   Lutricia Feil, PA-C  diltiazem (CARDIZEM CD) 180 MG 24 hr capsule Take 1 capsule (180 mg total) by mouth daily. 12/17/14   Antonieta Iba, MD  furosemide (LASIX) 20 MG tablet Take 1 tablet (20 mg total) by mouth daily as needed. 11/13/14   Antonieta Iba, MD  hydroxypropyl methylcellulose / hypromellose (ISOPTO TEARS / GONIOVISC) 2.5 % ophthalmic solution 1 drop as needed for dry eyes.    Historical Provider, MD  OLOPATADINE HCL OP Apply 665 mcg to eye as needed.    Historical Provider, MD  potassium chloride (K-DUR) 10 MEQ tablet Take 1 tablet (10 mEq total) by mouth daily. 11/13/14   Antonieta Iba, MD  propranolol (INDERAL) 20 MG tablet Take 1 tablet (20 mg total) by mouth 3 (three) times daily as needed. 11/13/14   Antonieta Iba, MD  rivaroxaban (XARELTO) 20 MG TABS tablet Take 1 tablet (20 mg  total) by mouth daily with supper. 11/03/14   Antonieta Iba, MD  tamsulosin (FLOMAX) 0.4 MG CAPS capsule Take 0.4 mg by mouth.    Historical Provider, MD   Meds Ordered and Administered this Visit  Medications - No data to display  BP 153/95 mmHg  Pulse 69  Temp(Src) 97.7 F (36.5 C) (Oral)  Resp 17  Ht  (1.88 m)  Wt 196 lb (88.905 kg)  BMI 25.15 kg/m2  SpO2 99% No data found.   Physical Exam  Constitutional: He appears well-developed and well-nourished. No distress.  HENT:  Head: Normocephalic and atraumatic.  Eyes: Pupils are equal, round, and reactive to light.  Neck:  Examination of  cervical spine shows a definite lists to the right. Marked limitation of motion to all planes. Her extremity sensation is intact to light touch throughout. Strength is intact to clinical testing. DTRs are 2 plus over 4 and symmetrical bilaterally.  Skin: He is not diaphoretic.  Nursing note and vitals reviewed.   ED Course  Procedures (including critical care time)  Labs Review Labs Reviewed - No data to display  Imaging Review Dg Cervical Spine Complete  12/31/2014  CLINICAL DATA:  Neck pain, spasms/stiffness x1 month EXAM: CERVICAL SPINE - COMPLETE 4+ VIEW COMPARISON:  None. FINDINGS: Cervical spine is visualized to the bottom of T1 on the lateral view. Normal cervical lordosis. No evidence of acute fracture or dislocation. Mild superior endplate changes at C7, likely chronic. Vertebral body heights are otherwise maintained. No prevertebral soft tissue swelling. Mild multilevel degenerative changes. Moderate neural foraminal narrowing on the right at C3-4 through C5-6 and on the left at C3-4 and C4-5. Visualized lung apices are clear. IMPRESSION: No evidence of acute fracture or dislocation. Mild multilevel degenerative changes. Moderate bilateral neural foraminal narrowing, as above. Electronically Signed   By: Charline Bills M.D.   On: 12/31/2014 10:59     Visual Acuity Review  Right Eye Distance:   Left Eye Distance:   Bilateral Distance:    Right Eye Near:   Left Eye Near:    Bilateral Near:         MDM   1. Torticollis, acute   2. DDD (degenerative disc disease), cervical    Discharge Medication List as of 12/31/2014 11:19 AM    START taking these medications   Details  diazepam (VALIUM) 2 MG tablet Take 1 tablet (2 mg total) by mouth 3 (three) times daily., Starting 12/31/2014, Until Discontinued, Print      Plan: 1. Test/x-ray results and diagnosis reviewed with patient 2. rx as per orders; risks, benefits, potential side effects reviewed with patient 3.  Recommend supportive treatment with heat or ice as necessary. She is on the xeralto am unable at this time to provide him with any anti-inflammatory medications. Therefore I will treat him only with Valium for muscle spasm and symptom avoidance. Recommend to consider using Tylenol arthritis strength for any pain. Urged him to refrain from using the cervical collar  Frequently.this could him from having muscle atrophy. I highly recommend follow-up with his primary care physician for possible enrollment in a physical therapy program or referral to a's cervical spine specialist evaluation and care.  4. F/u prn if symptoms worsen or don't improve     Lutricia Feil, PA-C 12/31/14 1124

## 2014-12-31 NOTE — ED Notes (Signed)
Pt c/o neck pain with HA, states he has had issues with his neck for the past year, worse in the past 3 weeks.. Pt arrives with soft collar in place.Marland Kitchen

## 2015-04-26 ENCOUNTER — Telehealth: Payer: Self-pay | Admitting: Cardiovascular Disease

## 2015-04-26 NOTE — Telephone Encounter (Signed)
Spoke w/ pt.  He reports that his BP has been elevated for a couple of months, first noticed at another MD office. The highest systolic reading was in the 170s. Pt sched to see Dr. Mariah Milling tomorrow @ 2:00 to discuss.

## 2015-04-26 NOTE — Telephone Encounter (Signed)
Pt calling stating he would like to talk to someone about his quick rise in BP and HR  Pt c/o BP issue: STAT if pt c/o blurred vision, one-sided weakness or slurred speech  1. What are your last 5 BP readings?  Today : 126 is normal for him but just a few minutes it just went up.  150's/100 and HR is 120  He took it a few times.  2. Are you having any other symptoms (ex. Dizziness, headache, blurred vision, passed out)? Nothing going on. States he is feeling fine.   3. What is your BP issue? Just shot up is a bit worried.

## 2015-04-27 ENCOUNTER — Ambulatory Visit (INDEPENDENT_AMBULATORY_CARE_PROVIDER_SITE_OTHER): Payer: Medicare PPO | Admitting: Cardiovascular Disease

## 2015-04-27 ENCOUNTER — Encounter: Payer: Self-pay | Admitting: Cardiovascular Disease

## 2015-04-27 VITALS — BP 112/72 | HR 106 | Ht 73.0 in | Wt 203.2 lb

## 2015-04-27 DIAGNOSIS — R0602 Shortness of breath: Secondary | ICD-10-CM | POA: Diagnosis not present

## 2015-04-27 DIAGNOSIS — Z7189 Other specified counseling: Secondary | ICD-10-CM | POA: Diagnosis not present

## 2015-04-27 DIAGNOSIS — I481 Persistent atrial fibrillation: Secondary | ICD-10-CM

## 2015-04-27 DIAGNOSIS — I4819 Other persistent atrial fibrillation: Secondary | ICD-10-CM

## 2015-04-27 DIAGNOSIS — I10 Essential (primary) hypertension: Secondary | ICD-10-CM

## 2015-04-27 MED ORDER — ATENOLOL 25 MG PO TABS: 25.0000 mg | ORAL_TABLET | Freq: Two times a day (BID) | ORAL | Status: DC

## 2015-04-27 NOTE — Assessment & Plan Note (Signed)
Heart rate mildly elevated on today's visit Possibly exacerbated by anxiety. Recommended that he monitor his heart rate at home at rest, consider adding atenolol daily or propranolol as needed for better heart rate control

## 2015-04-27 NOTE — Patient Instructions (Addendum)
You are doing well. No medication changes were made.  Blood pressure is great today! Take atenolol as needed for tachycardia/fast heart rate  Please call us if you have new issues that need to be addressed before your next appt.  Your physician wants you to follow-up in: 12 months.  You will receive a reminder letter in the mail two months in advance. If you don't receive a letter, please call our office to schedule the follow-up appointment.

## 2015-04-27 NOTE — Assessment & Plan Note (Addendum)
Long discussion concerning his shortness of breath symptoms. He is deconditioned, needs to start exercise program. This was discussed with him. Unable to exclude elevated heart rate on exertion as a contributor of his symptoms. Suggested he start atenolol daily, even use propranolol as needed before working out   Total encounter time more than 25 minutes  Greater than 50% was spent in counseling and coordination of care with the patient

## 2015-04-27 NOTE — Progress Notes (Signed)
Patient ID: Gary Dawson, male    DOB: 1940-08-22, 75 y.o.   MRN: [id number]  HPI Comments: [name] is a pleasant 75 year old gentleman with long history of atrial fibrillation dating back to at least 2008, prior small stroke in June 2010, hyperlipidemia, who presents  for routine followup of his atrial fibrillation  In follow-up, he reports having new issues pertaining to acute pain in his neck He stopped working as a Agricultural engineer, needed PT, chiropractic In the setting of this trauma, he had labile pulse and BP Pulse range in 90s to 100s at baseline He is concerned about systolic pressures up in the 160 range that would present while he had neck pain  Active but not like before, limiting himself to minimize any trauma to his neck, Also reports having flatfeet, difficulty finding exercise that does not hurt his feet No significant chest pain symptoms  Reports that he is not taking any rate blockers, only Cardizem  EKG on today's visit shows atrial fibrillation with ventricular rate 106 bpm, no significant ST or T-wave changes  Other past medical history He had a stress test, perfusion Myoview. This showed small region of perfusion defect, predominantly fixed in the inferoapical region. Low ejection fraction 35-40%, suspect secondary to conduction abnormality  Total cholesterol 120 on no statin   in atrial fibrillation in 2008 on EKG, normal sinus rhythm in June 2010, EKG February 2014 showing atrial fibrillation  Treadmill stress echo March 2014 showed no wall motion abnormality concerning for ischemia, good exercise tolerance, peak heart rate of 214 beats per minute ? With peak systolic pressure 191/96. Resting heart rate 98 beats per minute.   Echocardiogram June 2010 showing ejection fraction greater than 55%, mildly dilated left atrium, mild LVH, mild to moderate TR, mild to moderate MR Carotid ultrasound June 2010 showing no significant plaque MRI of the brain June  2010 showing tiny nonhemorrhagic left posterior parietal lobe infarct  No Known Allergies  Current Outpatient Prescriptions on File Prior to Visit  Medication Sig Dispense Refill  . AVODART 0.5 MG capsule Take 0.5 mg by mouth daily.     . diazepam (VALIUM) 2 MG tablet Take 1 tablet (2 mg total) by mouth 3 (three) times daily. 6 tablet 0  . diltiazem (CARDIZEM CD) 180 MG 24 hr capsule Take 1 capsule (180 mg total) by mouth daily. 90 capsule 3  . furosemide (LASIX) 20 MG tablet Take 1 tablet (20 mg total) by mouth daily as needed. 30 tablet 6  . hydroxypropyl methylcellulose / hypromellose (ISOPTO TEARS / GONIOVISC) 2.5 % ophthalmic solution 1 drop as needed for dry eyes.    . OLOPATADINE HCL OP Apply 665 mcg to eye as needed.    . potassium chloride (K-DUR) 10 MEQ tablet Take 1 tablet (10 mEq total) by mouth daily. 30 tablet 6  . propranolol (INDERAL) 20 MG tablet Take 1 tablet (20 mg total) by mouth 3 (three) times daily as needed. 90 tablet 6  . rivaroxaban (XARELTO) 20 MG TABS tablet Take 1 tablet (20 mg total) by mouth daily with supper. 30 tablet 3  . tamsulosin (FLOMAX) 0.4 MG CAPS capsule Take 0.4 mg by mouth.     No current facility-administered medications on file prior to visit.    Past Medical History  Diagnosis Date  . Persistent atrial fibrillation (HCC)     a. CHA2DS2VASc = 4-->chronic Xarelto.  . Psoriasis   . MVP (mitral valve prolapse)   . Reflux   .  CVA (cerebral vascular accident) (HCC)   . Hyperlipidemia   . Essential hypertension   . Vitamin B 12 deficiency   . Elevated PSA   . Atypical chest pain     a. 03/2012 St echo: nl EF, no wma's.    Past Surgical History  Procedure Laterality Date  . Varicose vein surgery      Social History  reports that he has never smoked. He does not have any smokeless tobacco history on file. He reports that he drinks alcohol. He reports that he does not use illicit drugs.  Family History family history includes Heart  attack in his father; Hyperlipidemia in his father; Hypertension in his father.   Review of Systems  Constitutional: Negative.   Respiratory: Positive for shortness of breath.        Shortness of breath with exertion  Cardiovascular: Positive for palpitations.  Gastrointestinal: Negative.   Musculoskeletal: Positive for neck pain and neck stiffness.       Foot pain  Skin: Negative.   Neurological: Negative.   Hematological: Negative.   Psychiatric/Behavioral: Negative.   All other systems reviewed and are negative.   BP 112/72 mmHg  Pulse 106  Ht  (1.854 m)  Wt 203 lb 4 oz (92.194 kg)  BMI 26.82 kg/m2  Physical Exam  Constitutional: He is oriented to person, place, and time. He appears well-developed and well-nourished.  HENT:  Head: Normocephalic.  Nose: Nose normal.  Mouth/Throat: Oropharynx is clear and moist.  Eyes: Conjunctivae are normal. Pupils are equal, round, and reactive to light.  Neck: Normal range of motion. Neck supple. No JVD present.  Cardiovascular: S1 normal, S2 normal, normal heart sounds and intact distal pulses.  An irregularly irregular rhythm present. Tachycardia present.  Exam reveals no gallop and no friction rub.   No murmur heard. Pulmonary/Chest: Effort normal and breath sounds normal. No respiratory distress. He has no wheezes. He has no rales. He exhibits no tenderness.  Abdominal: Soft. Bowel sounds are normal. He exhibits no distension. There is no tenderness.  Musculoskeletal: Normal range of motion. He exhibits no edema or tenderness.  Lymphadenopathy:    He has no cervical adenopathy.  Neurological: He is alert and oriented to person, place, and time. Coordination normal.  Skin: Skin is warm and dry. No rash noted. No erythema.  Psychiatric: He has a normal mood and affect. His behavior is normal. Judgment and thought content normal.      Assessment and Plan   Nursing note and vitals reviewed.

## 2015-04-27 NOTE — Assessment & Plan Note (Signed)
Blood pressure is well controlled on today's visit. No changes made to the medications. Labile pressures likely in the setting of recent neck pain

## 2015-04-27 NOTE — Assessment & Plan Note (Signed)
Tolerating anticoagulation well without significant symptoms

## 2015-05-14 ENCOUNTER — Other Ambulatory Visit: Payer: Self-pay | Admitting: *Deleted

## 2015-05-14 ENCOUNTER — Telehealth: Payer: Self-pay | Admitting: Cardiovascular Disease

## 2015-05-14 MED ORDER — ATENOLOL 25 MG PO TABS: 25.0000 mg | ORAL_TABLET | Freq: Two times a day (BID) | ORAL | Status: DC

## 2015-05-14 NOTE — Telephone Encounter (Signed)
Atenolol 25 mg #180 R#3 sent to [facility] for Refill.

## 2015-05-14 NOTE — Telephone Encounter (Signed)
*  STAT* If patient is at the pharmacy, call can be transferred to refill team.   1. Which medications need to be refilled? (please list name of each medication and dose if known) atenolol (TENORMIN) 25 MG tablet   2. Which pharmacy/location (including street and city if local pharmacy) is medication to be sent to :   [facility] on line  3. Do they need a 30 day or 90 day supply? 90 days

## 2015-05-14 NOTE — Telephone Encounter (Signed)
Requested Prescriptions   Signed Prescriptions Disp Refills  . atenolol (TENORMIN) 25 MG tablet 180 tablet 3    Sig: Take 1 tablet (25 mg total) by mouth 2 (two) times daily.    Authorizing Provider: Antonieta Iba    Ordering User: Kendrick Fries

## 2015-05-21 ENCOUNTER — Telehealth: Payer: Self-pay | Admitting: Cardiovascular Disease

## 2015-05-21 NOTE — Telephone Encounter (Signed)
Left detailed message on pt's vm that Dr. Mariah Milling and Gary Dawson prefer steroid injections, if possible, over Voltaren, as it increases his risk of bleeding.  Advised him that if he does use voltaren gel, to be sure to monitor for signs of bleeding (unusual bleeding or bruising, dark or red stool, bleeding gums, etc.). Asked him to call back w/ any questions or concerns.

## 2015-05-21 NOTE — Telephone Encounter (Signed)
Pt states he went to a podiatrist, and the Dr. would like to know if he can use Voltaren gel. Please call.

## 2015-05-21 NOTE — Telephone Encounter (Signed)
Gary Dawson rep, is doing a med interaction search and will call back w/ his findings.

## 2015-10-01 ENCOUNTER — Telehealth: Payer: Self-pay | Admitting: Cardiovascular Disease

## 2015-10-01 ENCOUNTER — Other Ambulatory Visit: Payer: Self-pay | Admitting: *Deleted

## 2015-10-01 MED ORDER — DILTIAZEM HCL ER COATED BEADS 180 MG PO CP24: 180.0000 mg | ORAL_CAPSULE | Freq: Every day | ORAL | 3 refills | Status: DC

## 2015-10-01 MED ORDER — RIVAROXABAN 20 MG PO TABS: 20.0000 mg | ORAL_TABLET | Freq: Every day | ORAL | 3 refills | Status: DC

## 2015-10-01 NOTE — Telephone Encounter (Signed)
°*  STAT* If patient is at the pharmacy, call can be transferred to refill team.   1. Which medications need to be refilled? (please list name of each medication and dose if known)  1. Xaerlto 20 mg 2. Diltiazem 180 mg   2. Which pharmacy/location (including street and city if local pharmacy) is medication to be sent to? [facility] mail order   3. Do they need a 30 day or 90 day supply?   90 day

## 2015-10-01 NOTE — Telephone Encounter (Signed)
Xarelto 20 mg #90 R#3 Diltiazem 180 mg #90 R#3  Sent to [facility].

## 2015-10-01 NOTE — Telephone Encounter (Signed)
Requested Prescriptions   Signed Prescriptions Disp Refills  . diltiazem (CARDIZEM CD) 180 MG 24 hr capsule 90 capsule 3    Sig: Take 1 capsule (180 mg total) by mouth daily.    Authorizing Provider: Antonieta Iba    Ordering User: Kendrick Fries rivaroxaban (XARELTO) 20 MG TABS tablet 90 tablet 3    Sig: Take 1 tablet (20 mg total) by mouth daily with supper.    Authorizing Provider: Antonieta Iba    Ordering User: Kendrick Fries

## 2015-12-31 ENCOUNTER — Ambulatory Visit
Admission: EM | Admit: 2015-12-31 | Discharge: 2015-12-31 | Disposition: A | Discharge status: Home / Self Care | Payer: Medicare PPO | Attending: Family Medicine | Admitting: Family Medicine

## 2015-12-31 ENCOUNTER — Encounter: Payer: Self-pay | Admitting: Emergency Medicine

## 2015-12-31 ENCOUNTER — Ambulatory Visit (INDEPENDENT_AMBULATORY_CARE_PROVIDER_SITE_OTHER): Payer: Medicare PPO

## 2015-12-31 DIAGNOSIS — S60011A Contusion of right thumb without damage to nail, initial encounter: Secondary | ICD-10-CM | POA: Diagnosis not present

## 2015-12-31 NOTE — Discharge Instructions (Signed)
Recommend follow up with hand orthopedist

## 2015-12-31 NOTE — ED Triage Notes (Signed)
Patient states that he jammed his right thumb about a week ago.  Patient c/o ongoing pain in his right thumb.

## 2015-12-31 NOTE — ED Provider Notes (Signed)
[facility]    CSN: [id number] Arrival date & time: 12/31/15  1138     History   Chief Complaint Chief Complaint  Patient presents with  . Finger Injury    right thumb    HPI Gary Dawson is a 75 y.o. male.   75 yo male with a c/o right thumb bony enlargement and tender to touch over area he jammed in a wall about one and a half weeks ago. Denies any crushing injury, numbness/tingling, redness, fevers, chills.     The history is provided by the patient.    Past Medical History:  Diagnosis Date  . Atypical chest pain    a. 03/2012 St echo: nl EF, no wma's.  . CVA (cerebral vascular accident) (HCC)   . Elevated PSA   . Essential hypertension   . Hyperlipidemia   . MVP (mitral valve prolapse)   . Persistent atrial fibrillation (HCC)    a. CHA2DS2VASc = 4-->chronic Xarelto.  . Psoriasis   . Reflux   . Vitamin B 12 deficiency     Patient Active Problem List   Diagnosis Date Noted  . Encounter for anticoagulation discussion and counseling 11/13/2014  . Essential hypertension 05/08/2013  . Hyperlipidemia 05/08/2013  . Shortness of breath 05/08/2013  . Atrial fibrillation (HCC) 05/06/2013  . CVA (cerebral vascular accident) (HCC) 05/06/2013    Past Surgical History:  Procedure Laterality Date  . VARICOSE VEIN SURGERY         Home Medications    Prior to Admission medications   Medication Sig Start Date End Date Taking? Authorizing Provider  atenolol (TENORMIN) 25 MG tablet Take 1 tablet (25 mg total) by mouth 2 (two) times daily. 05/14/15   Antonieta Ibaimothy J Gollan, MD  AVODART 0.5 MG capsule Take 0.5 mg by mouth daily.  03/21/13   Historical Provider, MD  diazepam (VALIUM) 2 MG tablet Take 1 tablet (2 mg total) by mouth 3 (three) times daily. 12/31/14   Lutricia FeilWilliam P Roemer, PA-C  diltiazem (CARDIZEM CD) 180 MG 24 hr capsule Take 1 capsule (180 mg total) by mouth daily. 10/01/15   Antonieta Ibaimothy J Gollan, MD  furosemide (LASIX) 20 MG tablet Take 1 tablet  (20 mg total) by mouth daily as needed. 11/13/14   Antonieta Ibaimothy J Gollan, MD  hydroxypropyl methylcellulose / hypromellose (ISOPTO TEARS / GONIOVISC) 2.5 % ophthalmic solution 1 drop as needed for dry eyes.    Historical Provider, MD  OLOPATADINE HCL OP Apply 665 mcg to eye as needed.    Historical Provider, MD  potassium chloride (K-DUR) 10 MEQ tablet Take 1 tablet (10 mEq total) by mouth daily. 11/13/14   Antonieta Ibaimothy J Gollan, MD  propranolol (INDERAL) 20 MG tablet Take 1 tablet (20 mg total) by mouth 3 (three) times daily as needed. 11/13/14   Antonieta Ibaimothy J Gollan, MD  rivaroxaban (XARELTO) 20 MG TABS tablet Take 1 tablet (20 mg total) by mouth daily with supper. 10/01/15   Antonieta Ibaimothy J Gollan, MD  tamsulosin (FLOMAX) 0.4 MG CAPS capsule Take 0.4 mg by mouth.    Historical Provider, MD    Family History Family History  Problem Relation Age of Onset  . Heart attack Father   . Hypertension Father   . Hyperlipidemia Father     Social History Social History  Substance Use Topics  . Smoking status: Never Smoker  . Smokeless tobacco: Never Used  . Alcohol use Yes     Comment: social      Allergies  Patient has no known allergies.   Review of Systems Review of Systems   Physical Exam Triage Vital Signs ED Triage Vitals  Enc Vitals Group     BP 12/31/15 1245 (!) 149/84     Pulse Rate 12/31/15 1245 84     Resp 12/31/15 1245 16     Temp 12/31/15 1245 98.3 F (36.8 C)     Temp Source 12/31/15 1245 Oral     SpO2 12/31/15 1245 98 %     Weight 12/31/15 1246 200 lb (90.7 kg)     Height 12/31/15 1246 6\' 1"  (1.854 m)     Head Circumference --      Peak Flow --      Pain Score 12/31/15 1247 5     Pain Loc --      Pain Edu? --      Excl. in GC? --    No data found.   Updated Vital Signs BP (!) 144/90 (BP Location: Left Arm)   Pulse 84   Temp 98.3 F (36.8 C) (Oral)   Resp 16   Ht 6\' 1"  (1.854 m)   Wt 200 lb (90.7 kg)   SpO2 98%   BMI 26.39 kg/m   Visual Acuity Right Eye  Distance:   Left Eye Distance:   Bilateral Distance:    Right Eye Near:   Left Eye Near:    Bilateral Near:     Physical Exam  Constitutional: He appears well-developed and well-nourished. No distress.  Musculoskeletal:       Right hand: He exhibits bony tenderness (over the 1st MTP joint area). He exhibits normal range of motion, normal two-point discrimination, normal capillary refill, no deformity, no laceration and no swelling. Normal sensation noted. Normal strength noted.       Hands: Skin: He is not diaphoretic.  Nursing note and vitals reviewed.    UC Treatments / Results  Labs (all labs ordered are listed, but only abnormal results are displayed) Labs Reviewed - No data to display  EKG  EKG Interpretation None       Radiology Dg Finger Thumb Right  Result Date: 12/31/2015 CLINICAL DATA:  Injury 2 weeks ago with hyperextension EXAM: RIGHT THUMB 2+V COMPARISON:  None. FINDINGS: Negative for fracture or dislocation. Degenerative change of the base of thumb mild-to-moderate in degree. No erosion. IMPRESSION: Degenerative change base of thumb.  Negative for fracture. Electronically Signed   By: Marlan Palau M.D.   On: 12/31/2015 13:27    Procedures Procedures (including critical care time)  Medications Ordered in UC Medications - No data to display   Initial Impression / Assessment and Plan / UC Course  I have reviewed the triage vital signs and the nursing notes.  Pertinent labs & imaging results that were available during my care of the patient were reviewed by me and considered in my medical decision making (see chart for details).  Clinical Course     Final Clinical Impressions(s) / UC Diagnoses   Final diagnoses:  Contusion of right thumb without damage to nail, initial encounter    New Prescriptions Discharge Medication List as of 12/31/2015  2:06 PM     1. x-ray results (negative for fracture) and diagnosis reviewed with patient 2. Recommend  supportive treatment with otc analgesics prn 3. Follow-up with orthopedist for further evaluation and management if no improvement over the next week or prn if symptoms worsen   Payton Mccallum, MD 12/31/15 1431

## 2016-04-25 NOTE — Progress Notes (Signed)
Cardiology Office Note  Date:  04/26/2016   ID:  Gary Dawson, DOB February 28, 1940, MRN [id number]  PCP:  Gary Sell, MD   Chief Complaint  Patient presents with  . other    1 yr f/u no complaints today. Meds reviewed verbally with pt.    HPI:  Gary Dawson is a pleasant 76 year old gentleman with  long history of atrial fibrillation dating back to at least 2008,  prior small stroke in June 2010,  hyperlipidemia,  OSA, on CPAP who presents  for routine followup of his atrial fibrillation  On previous office visit, had acute pain in his neck He stopped working as a Agricultural engineer, needed PT, chiropractic  Now neck is better Has low arch on feet Got fitted for a "boot" Seen by chiropractor  No significant chest pain symptoms Tolerating atenolol  Total chol 128, LDL 73,   EKG on today's visit shows atrial fibrillation with ventricular rate 99 bpm, no significant ST or T-wave changes  Other past medical history He had a stress test, perfusion Myoview. This showed small region of perfusion defect, predominantly fixed in the inferoapical region. Low ejection fraction 35-40%, suspect secondary to conduction abnormality  Total cholesterol 120 on no statin   in atrial fibrillation in 2008 on EKG, normal sinus rhythm in June 2010, EKG February 2014 showing atrial fibrillation  Treadmill stress echo March 2014 showed no wall motion abnormality concerning for ischemia, good exercise tolerance, peak heart rate of 214 beats per minute ? With peak systolic pressure 191/96. Resting heart rate 98 beats per minute.   Echocardiogram June 2010 showing ejection fraction greater than 55%, mildly dilated left atrium, mild LVH, mild to moderate TR, mild to moderate MR Carotid ultrasound June 2010 showing no significant plaque MRI of the brain June 2010 showing tiny nonhemorrhagic left posterior parietal lobe infarct  PMH:   has a past medical history of Atypical chest pain; CVA  (cerebral vascular accident) (HCC); Elevated PSA; Essential hypertension; Hyperlipidemia; MVP (mitral valve prolapse); Persistent atrial fibrillation (HCC); Psoriasis; Reflux; and Vitamin B 12 deficiency.  PSH:    Past Surgical History:  Procedure Laterality Date  . VARICOSE VEIN SURGERY      Current Outpatient Prescriptions  Medication Sig Dispense Refill  . atenolol (TENORMIN) 25 MG tablet Take 1 tablet (25 mg total) by mouth 2 (two) times daily. 180 tablet 3  . AVODART 0.5 MG capsule Take 0.5 mg by mouth daily.     . hydroxypropyl methylcellulose / hypromellose (ISOPTO TEARS / GONIOVISC) 2.5 % ophthalmic solution 1 drop as needed for dry eyes.    . rivaroxaban (XARELTO) 20 MG TABS tablet Take 1 tablet (20 mg total) by mouth daily with supper. 90 tablet 3  . tamsulosin (FLOMAX) 0.4 MG CAPS capsule Take 0.4 mg by mouth.     No current facility-administered medications for this visit.      Allergies:   Patient has no known allergies.   Social History:  The patient  reports that he has never smoked. He has never used smokeless tobacco. He reports that he drinks alcohol. He reports that he does not use drugs.   Family History:   family history includes Heart attack in his father; Hyperlipidemia in his father; Hypertension in his father.    Review of Systems: Review of Systems  Constitutional: Negative.   Respiratory: Negative.   Cardiovascular: Negative.   Gastrointestinal: Negative.   Musculoskeletal: Positive for neck pain.       Plantar Fasciitis  pain  Neurological: Negative.   Psychiatric/Behavioral: Negative.   All other systems reviewed and are negative.    PHYSICAL EXAM: VS:  BP 110/70 (BP Location: Left Arm, Patient Position: Sitting, Cuff Size: Normal)   Pulse 99   Ht 6' 1.5" (1.867 m)   Wt 204 lb 4 oz (92.6 kg)   BMI 26.58 kg/m  , BMI Body mass index is 26.58 kg/m. GEN: Well nourished, well developed, in no acute distress  HEENT: normal  Neck: no JVD,  carotid bruits, or masses Cardiac: RRR; no murmurs, rubs, or gallops,no edema  Respiratory:  clear to auscultation bilaterally, normal work of breathing GI: soft, nontender, nondistended, + BS MS: no deformity or atrophy  Skin: warm and dry, no rash Neuro:  Strength and sensation are intact Psych: euthymic mood, full affect    Recent Labs: No results found for requested labs within last 8760 hours.    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 No changes made to the medications.  Mixed hyperlipidemia  Shortness of breath  Encounter for anticoagulation discussion and counseling On xarelto, no bleeding  Plantar facsitis Sees chiropractor  OSA, on CPAP Sleep hit or miss, watches TV   Total encounter time more than 25 minutes  Greater than 50% was spent in counseling and coordination of care with the patient   Disposition:   F/U  6 months  No orders of the defined types were placed in this encounter.    Signed, Gary Dawson, M.D., Ph.D. 04/26/2016  [facility] [place], [place] [phone]

## 2016-04-26 ENCOUNTER — Encounter: Payer: Self-pay | Admitting: Cardiovascular Disease

## 2016-04-26 ENCOUNTER — Ambulatory Visit (INDEPENDENT_AMBULATORY_CARE_PROVIDER_SITE_OTHER): Payer: Medicare PPO | Admitting: Cardiovascular Disease

## 2016-04-26 VITALS — BP 110/70 | HR 99 | Ht 73.5 in | Wt 204.2 lb

## 2016-04-26 DIAGNOSIS — I10 Essential (primary) hypertension: Secondary | ICD-10-CM | POA: Diagnosis not present

## 2016-04-26 DIAGNOSIS — E782 Mixed hyperlipidemia: Secondary | ICD-10-CM

## 2016-04-26 DIAGNOSIS — I481 Persistent atrial fibrillation: Secondary | ICD-10-CM | POA: Diagnosis not present

## 2016-04-26 DIAGNOSIS — Z7189 Other specified counseling: Secondary | ICD-10-CM | POA: Diagnosis not present

## 2016-04-26 DIAGNOSIS — I6310 Cerebral infarction due to embolism of unspecified precerebral artery: Secondary | ICD-10-CM

## 2016-04-26 DIAGNOSIS — R0602 Shortness of breath: Secondary | ICD-10-CM

## 2016-04-26 DIAGNOSIS — I4819 Other persistent atrial fibrillation: Secondary | ICD-10-CM

## 2016-04-26 NOTE — Patient Instructions (Signed)

## 2016-10-17 ENCOUNTER — Telehealth: Payer: Self-pay | Admitting: Cardiovascular Disease

## 2016-10-17 NOTE — Telephone Encounter (Signed)
Left voicemail message for patient to call back.

## 2016-10-17 NOTE — Telephone Encounter (Signed)
PT returning call

## 2016-10-17 NOTE — Telephone Encounter (Signed)
Symptoms started 4 days ago More and more lightheadedness, which increases overtime then goes away Saw Dr Sampson Goon today and he recommended coming to see Dr. Mariah Milling Declined appt. tomorrow 10/3 due to upcoming travel arrangements Would like to discuss whether it is necessary to be seen Please call to advise  Cell : [phone]

## 2016-10-17 NOTE — Telephone Encounter (Signed)
Left voicemail message to call back  

## 2016-10-18 NOTE — Telephone Encounter (Signed)
Patient states that he has not felt well and with bending over and standing back up he becomes lightheaded and dizzy. He reports that recently he went to [facility] and they called paramedics. Patient went to front desk and asked someone to check his blood pressure and he reported just feeling funny and was dizzy. EMS arrived and they advised him to see someone. Reviewed with him that he should use extreme caution when changing positions. Scheduled him to come in and see Dr. Mariah Milling next Friday 10/27/16 at 3:20 PM. He was agreeable with this appointment and had no further questions at this time.

## 2016-10-20 ENCOUNTER — Other Ambulatory Visit: Payer: Self-pay

## 2016-10-26 NOTE — Progress Notes (Signed)
Cardiology Office Note  Date:  10/27/2016   ID:  Gary, Dawson [birth year], MRN [id number]  PCP:  Mick Sell, MD   Chief Complaint  Patient presents with  . other    C/o headaches, elevated BP and lightheadedness. Meds reviewed verbally with pt.    HPI:  [name] is a pleasant 76 year old gentleman with  long history of atrial fibrillation dating back to at least 2008,  prior small stroke in June 2010,  hyperlipidemia,  OSA, on CPAP who presents  for routine followup of his permanent atrial fibrillation  In follow-up today he reports feeling well Denies any significant shortness of breath on exertion He is having some balance issues, typically when he is standing Feels it is from his balance  Pulse at home in the 60s (uses his watch, may not be accurate) Reports heart rate will increase with exertion No regular exercise but active, 6000 to 7500 steps daily  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 This showed small region of perfusion defect, predominantly fixed in the inferoapical region. Low ejection fraction 35-40%, suspect secondary to conduction abnormality  Total cholesterol 120 on no statin   in atrial fibrillation in 2008 on EKG, normal sinus rhythm in June 2010, EKG February 2014 showing atrial fibrillation  Treadmill stress echo March 2014 showed no wall motion abnormality concerning for ischemia, good exercise tolerance, peak heart rate of 214 beats per minute ? With peak systolic pressure 191/96. Resting heart  rate 98 beats per minute.   Echocardiogram June 2010 showing ejection fraction greater than 55%, mildly dilated left atrium, mild LVH, mild to moderate TR, mild to moderate MR Carotid ultrasound June 2010 showing no significant plaque MRI of the brain June 2010 showing tiny nonhemorrhagic left posterior parietal lobe infarct  PMH:   has a past medical history of Atypical chest pain; CVA (cerebral vascular accident) (HCC); Elevated PSA; Essential hypertension; Hyperlipidemia; MVP (mitral valve prolapse); Persistent atrial fibrillation (HCC); Psoriasis; Reflux; and Vitamin B 12 deficiency.  PSH:    Past Surgical History:  Procedure Laterality Date  . VARICOSE VEIN SURGERY      Current Outpatient Prescriptions  Medication Sig Dispense Refill  . atenolol (TENORMIN) 25 MG tablet Take 1 tablet (25 mg total) by mouth 2 (two) times daily. 180 tablet 3  . AVODART 0.5 MG capsule Take 0.5 mg by mouth daily.     . hydroxypropyl methylcellulose / hypromellose (ISOPTO TEARS / GONIOVISC) 2.5 % ophthalmic solution 1 drop as needed for dry eyes.    . rivaroxaban (XARELTO) 20 MG TABS tablet Take 1 tablet (20 mg total) by mouth daily with supper. 90 tablet 3  . tamsulosin (FLOMAX) 0.4 MG CAPS capsule Take 0.4 mg by mouth.     No current facility-administered medications for this visit.      Allergies:   Patient has no known allergies.   Social History:  The patient  reports that he has never smoked. He has never used smokeless tobacco. He reports that he drinks alcohol. He reports that he does not  use drugs.   Family History:   family history includes Heart attack in his father; Hyperlipidemia in his father; Hypertension in his father.    Review of Systems: Review of Systems  Constitutional: Negative.   Respiratory: Negative.   Cardiovascular: Negative.   Gastrointestinal: Negative.   Musculoskeletal: Negative.   Neurological: Positive for dizziness.       Balance problems   Psychiatric/Behavioral: Negative.   All other systems reviewed and are negative.    PHYSICAL EXAM: VS:  BP 112/64 (BP Location: Left Arm, Patient Position: Sitting, Cuff Size: Normal)   Pulse (!) 115   Ht 6' 1.5" (1.867 m)   Wt 201 lb 4 oz (91.3 kg)   BMI 26.19 kg/m  , BMI Body mass index is 26.19 kg/m. GEN: Well nourished, well developed, in no acute distress  HEENT: normal  Neck: no JVD, carotid bruits, or masses Cardiac: irregularly irregular, rapid, no murmurs, rubs, or gallops,no edema  Respiratory:  clear to auscultation bilaterally, normal work of breathing GI: soft, nontender, nondistended, + BS MS: no deformity or atrophy  Skin: warm and dry, no rash Neuro:  Strength and sensation are intact Psych: euthymic mood, full affect    Recent Labs: No results found for requested labs within last 8760 hours.    Lipid Panel No results found for: CHOL, HDL, LDLCALC, TRIG    Wt Readings from Last 3 Encounters:  10/27/16 201 lb 4 oz (91.3 kg)  04/26/16 204 lb 4 oz (92.6 kg)  12/31/15 200 lb (90.7 kg)       ASSESSMENT AND PLAN:  Persistent atrial fibrillation (HCC) levated heart rate on today's visit, he does not appreciate tachycardia Discussed numerous ways to monitor heart rate at home He has been using fit bit which seems inaccurate based on evaluation today typically running 60 beats per minute when heart rate is 100 or higher discussedpulse oximeters, smart phone programs Suggested if rate continues to run fast that he call our office, we would likely increase the atenolol, or add digoxin if blood pressure does not tolerate  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) On anticoagulation, tolerating without any significantbleeding  Essential hypertension blood pressure borderline low If needed, may need to add digoxin for rate control rather thanincrease atenolol  Mixed hyperlipidemia Managed by primary care Currently not on  a statin  Shortness of breath Denies any significant symptoms of shortness of breath Unclear if recent symptoms of dizziness are secondary to tachycardia or gait deficiency  Encounter for anticoagulation discussion and counseling On xarelto, no bleeding  OSA, on CPAP Stressed the importance of staying on his CPAP   Total encounter time more than 25 minutes  Greater than 50% was spent in counseling and coordination of care with the patient   Disposition:   F/U  6 months   Orders Placed This Encounter  Procedures  . EKG 12-Lead     Signed, Dossie Arbour, M.D., Ph.D. 10/27/2016  [facility] [place], [place] [phone]

## 2016-10-27 ENCOUNTER — Encounter: Payer: Self-pay | Admitting: Cardiovascular Disease

## 2016-10-27 ENCOUNTER — Ambulatory Visit (INDEPENDENT_AMBULATORY_CARE_PROVIDER_SITE_OTHER): Payer: Medicare PPO | Admitting: Cardiovascular Disease

## 2016-10-27 VITALS — BP 112/64 | HR 115 | Ht 73.5 in | Wt 201.2 lb

## 2016-10-27 DIAGNOSIS — E782 Mixed hyperlipidemia: Secondary | ICD-10-CM

## 2016-10-27 DIAGNOSIS — I6310 Cerebral infarction due to embolism of unspecified precerebral artery: Secondary | ICD-10-CM

## 2016-10-27 DIAGNOSIS — I482 Chronic atrial fibrillation: Secondary | ICD-10-CM

## 2016-10-27 DIAGNOSIS — R0602 Shortness of breath: Secondary | ICD-10-CM | POA: Diagnosis not present

## 2016-10-27 DIAGNOSIS — I10 Essential (primary) hypertension: Secondary | ICD-10-CM

## 2016-10-27 DIAGNOSIS — Z7189 Other specified counseling: Secondary | ICD-10-CM | POA: Diagnosis not present

## 2016-10-27 DIAGNOSIS — I4821 Permanent atrial fibrillation: Secondary | ICD-10-CM

## 2016-10-27 NOTE — Patient Instructions (Signed)
Medication Instructions:   No medication changes made  Monitor heart rate and blood pressure  Labwork:  No new labs needed  Testing/Procedures:  No further testing at this time   Follow-Up: It was a pleasure seeing you in the office today. Please call us if you have new issues that need to be addressed before your next appt.  [phone]  Your physician wants you to follow-up in: 6 months.  You will receive a reminder letter in the mail two months in advance. If you don't receive a letter, please call our office to schedule the follow-up appointment.  If you need a refill on your cardiac medications before your next appointment, please call your pharmacy.

## 2016-11-07 ENCOUNTER — Other Ambulatory Visit: Payer: Self-pay | Admitting: Cardiovascular Disease

## 2016-12-15 DIAGNOSIS — M436 Torticollis: Secondary | ICD-10-CM | POA: Insufficient documentation

## 2016-12-26 ENCOUNTER — Other Ambulatory Visit: Payer: Self-pay

## 2016-12-26 ENCOUNTER — Encounter: Payer: Self-pay | Admitting: Gynecology

## 2016-12-26 ENCOUNTER — Ambulatory Visit
Admission: EM | Admit: 2016-12-26 | Discharge: 2016-12-26 | Disposition: A | Discharge status: Home / Self Care | Payer: Medicare PPO | Attending: Family Medicine | Admitting: Family Medicine

## 2016-12-26 DIAGNOSIS — M5489 Other dorsalgia: Secondary | ICD-10-CM

## 2016-12-26 DIAGNOSIS — M898X1 Other specified disorders of bone, shoulder: Secondary | ICD-10-CM | POA: Diagnosis not present

## 2016-12-26 DIAGNOSIS — M25511 Pain in right shoulder: Secondary | ICD-10-CM

## 2016-12-26 MED ORDER — BACLOFEN 10 MG PO TABS: 5.0000 mg | ORAL_TABLET | Freq: Three times a day (TID) | ORAL | 0 refills | Status: DC | PRN

## 2016-12-26 NOTE — Discharge Instructions (Signed)
Heat, Tylenol (1000 mg three times daily as needed). Baclofen 5-10 mg at night (you may use this during the day if you like).  Take care  Dr. Adriana Simas

## 2016-12-26 NOTE — ED Provider Notes (Signed)
[facility]    CSN: [id number] Arrival date & time: 12/26/16  1209  History   Chief Complaint Chief Complaint  Patient presents with  . Back Pain   HPI  76 year old male presents with back pain.  Started last night.  Pain is located around the right scapula.  He states that earlier in the day he had been shoveling snow.  He states that the pain was severe last night.  It slightly improved today.  Some improvement with activity.  However, the pain continues to persist.  He has taken no medications as he was concerned about interference with his Xarelto.  No known exacerbating factors.  Likely inciting event was shoveling snow.  No other associated symptoms.  No other complaints or concerns at this time.  Past Medical History:  Diagnosis Date  . Atypical chest pain    a. 03/2012 St echo: nl EF, no wma's.  . CVA (cerebral vascular accident) (HCC)   . Elevated PSA   . Essential hypertension   . Hyperlipidemia   . MVP (mitral valve prolapse)   . Persistent atrial fibrillation (HCC)    a. CHA2DS2VASc = 4-->chronic Xarelto.  . Psoriasis   . Reflux   . Vitamin B 12 deficiency     Patient Active Problem List   Diagnosis Date Noted  . Encounter for anticoagulation discussion and counseling 11/13/2014  . Essential hypertension 05/08/2013  . Hyperlipidemia 05/08/2013  . Shortness of breath 05/08/2013  . Permanent atrial fibrillation (HCC) 05/06/2013  . CVA (cerebral vascular accident) (HCC) 05/06/2013    Past Surgical History:  Procedure Laterality Date  . VARICOSE VEIN SURGERY         Home Medications    Prior to Admission medications   Medication Sig Start Date End Date Taking? Authorizing Provider  atenolol (TENORMIN) 25 MG tablet Take 1 tablet (25 mg total) by mouth 2 (two) times daily. 05/14/15  Yes Gollan, Tollie Pizzaimothy J, MD  AVODART 0.5 MG capsule Take 0.5 mg by mouth daily.  03/21/13  Yes [provider]  hydroxypropyl methylcellulose / hypromellose  (ISOPTO TEARS / GONIOVISC) 2.5 % ophthalmic solution 1 drop as needed for dry eyes.   Yes [provider]  tamsulosin (FLOMAX) 0.4 MG CAPS capsule Take 0.4 mg by mouth.   Yes [provider]  XARELTO 20 MG TABS tablet TAKE 1 TABLET EVERY DAY WITH SUPPER 11/07/16  Yes Gollan, Tollie Pizzaimothy J, MD  baclofen (LIORESAL) 10 MG tablet Take 0.5-1 tablets (5-10 mg total) by mouth 3 (three) times daily as needed for muscle spasms. 12/26/16   Tommie Samsook, Zauria Dombek G, DO    Family History Family History  Problem Relation Age of Onset  . Heart attack Father   . Hypertension Father   . Hyperlipidemia Father     Social History Social History   Tobacco Use  . Smoking status: Never Smoker  . Smokeless tobacco: Never Used  Substance Use Topics  . Alcohol use: Yes    Comment: social   . Drug use: No     Allergies   Patient has no known allergies.   Review of Systems Review of Systems  Constitutional: Negative.   Respiratory: Negative.   Musculoskeletal: Positive for back pain.       Pain around the scapula.   Physical Exam Triage Vital Signs ED Triage Vitals  Enc Vitals Group     BP 12/26/16 1315 (!) 147/100     Pulse Rate 12/26/16 1315 100     Resp  12/26/16 1315 16     Temp 12/26/16 1315 98.3 F (36.8 C)     Temp Source 12/26/16 1315 Oral     SpO2 12/26/16 1315 100 %     Weight 12/26/16 1316 195 lb (88.5 kg)     Height 12/26/16 1316 6\' 1"  (1.854 m)     Head Circumference --      Peak Flow --      Pain Score 12/26/16 1317 9     Pain Loc --      Pain Edu? --      Excl. in GC? --    Updated Vital Signs BP (!) 147/100 (BP Location: Left Arm)   Pulse 100   Temp 98.3 F (36.8 C) (Oral)   Resp 16   Ht 6\' 1"  (1.854 m)   Wt 195 lb (88.5 kg)   SpO2 100%   BMI 25.73 kg/m     Physical Exam  Constitutional: He is oriented to person, place, and time. He appears well-developed and well-nourished. No distress.  HENT:  Head: Normocephalic and atraumatic.  Cardiovascular:    Irregularly irregular.  No murmur appreciated.  Pulmonary/Chest: Effort normal and breath sounds normal. No respiratory distress. He has no wheezes. He has no rales.  Musculoskeletal:  Patient with tenderness to palpation around the medial right scapula.  No thoracic spine tenderness.  Neurological: He is alert and oriented to person, place, and time.  No apparent deficits.  Psychiatric: He has a normal mood and affect. His behavior is normal.  Vitals reviewed.  UC Treatments / Results  Labs (all labs ordered are listed, but only abnormal results are displayed) Labs Reviewed - No data to display  EKG  EKG Interpretation None       Radiology No results found.  Procedures Procedures (including critical care time)  Medications Ordered in UC Medications - No data to display   Initial Impression / Assessment and Plan / UC Course  I have reviewed the triage vital signs and the nursing notes.  Pertinent labs & imaging results that were available during my care of the patient were reviewed by me and considered in my medical decision making (see chart for details).     76 year old male presents with periscapular pain.  Secondary to spasm.  Advised heat, Tylenol.  Treating with baclofen.  Final Clinical Impressions(s) / UC Diagnoses   Final diagnoses:  Periscapular pain    ED Discharge Orders        Ordered    baclofen (LIORESAL) 10 MG tablet  3 times daily PRN     12/26/16 1442     Controlled Substance Prescriptions [place] Controlled Substance Registry consulted? Not Applicable   Tommie Sams, DO 12/26/16 1507

## 2016-12-26 NOTE — ED Triage Notes (Signed)
Per patient was shoveling snow when he woke up the next day with right upper back pain.

## 2016-12-30 ENCOUNTER — Other Ambulatory Visit: Payer: Self-pay | Admitting: Cardiovascular Disease

## 2017-01-01 NOTE — Telephone Encounter (Signed)
Please advise if ok to refill medication not on med list.

## 2017-01-02 NOTE — Telephone Encounter (Signed)
Cardizem was taken off the patient's medication list on intake in 04/2016 with Dr. Mariah Milling.  He recently saw Dr. Sampson Goon, PCP on 10/17/16. Per Dr. Sampson Goon: A fib - has been doing ok since changing bb to diltiazem 180 with Dr Mariah Milling - HR good, more energy  I am uncertain if he has been on diltiazem this whole time and it was inadvertently taken off his med list. Please call the patient to confirm if he is taking this and if so, for how long?  Thanks!

## 2017-01-03 ENCOUNTER — Telehealth: Payer: Self-pay | Admitting: Cardiovascular Disease

## 2017-01-03 ENCOUNTER — Other Ambulatory Visit: Payer: Self-pay | Admitting: *Deleted

## 2017-01-03 ENCOUNTER — Encounter: Payer: Self-pay | Admitting: *Deleted

## 2017-01-03 MED ORDER — DILTIAZEM HCL ER COATED BEADS 180 MG PO CP24: 180.0000 mg | ORAL_CAPSULE | Freq: Every day | ORAL | 3 refills | Status: DC

## 2017-01-03 NOTE — Telephone Encounter (Signed)
Pt taking Diltiazem 180 mg tablet qd. Pt aware that I will send in Rx for Pt to [facility] pharmacy.

## 2017-01-03 NOTE — Telephone Encounter (Signed)
Patient returning call he received about medications

## 2017-01-03 NOTE — Telephone Encounter (Signed)
I have spoke to pt and confirmed that he is taking Diltiazem.

## 2017-01-03 NOTE — Telephone Encounter (Signed)
LMOVM

## 2017-01-08 ENCOUNTER — Emergency Department: Payer: No Typology Code available for payment source

## 2017-01-08 ENCOUNTER — Emergency Department
Admission: EM | Admit: 2017-01-08 | Discharge: 2017-01-08 | Disposition: A | Discharge status: Home / Self Care | Payer: No Typology Code available for payment source | Attending: Emergency Medicine | Admitting: Emergency Medicine

## 2017-01-08 ENCOUNTER — Other Ambulatory Visit: Payer: Self-pay

## 2017-01-08 DIAGNOSIS — Z8673 Personal history of transient ischemic attack (TIA), and cerebral infarction without residual deficits: Secondary | ICD-10-CM | POA: Diagnosis not present

## 2017-01-08 DIAGNOSIS — S0990XA Unspecified injury of head, initial encounter: Secondary | ICD-10-CM | POA: Diagnosis not present

## 2017-01-08 DIAGNOSIS — Z79899 Other long term (current) drug therapy: Secondary | ICD-10-CM | POA: Insufficient documentation

## 2017-01-08 DIAGNOSIS — Z7901 Long term (current) use of anticoagulants: Secondary | ICD-10-CM | POA: Diagnosis not present

## 2017-01-08 DIAGNOSIS — Y999 Unspecified external cause status: Secondary | ICD-10-CM | POA: Diagnosis not present

## 2017-01-08 DIAGNOSIS — I10 Essential (primary) hypertension: Secondary | ICD-10-CM | POA: Diagnosis not present

## 2017-01-08 DIAGNOSIS — Y92410 Unspecified street and highway as the place of occurrence of the external cause: Secondary | ICD-10-CM | POA: Insufficient documentation

## 2017-01-08 DIAGNOSIS — R42 Dizziness and giddiness: Secondary | ICD-10-CM | POA: Diagnosis not present

## 2017-01-08 DIAGNOSIS — Y939 Activity, unspecified: Secondary | ICD-10-CM | POA: Insufficient documentation

## 2017-01-08 NOTE — Discharge Instructions (Signed)

## 2017-01-08 NOTE — ED Notes (Signed)
E sig not working, inst given to patient.
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
## 2017-04-26 ENCOUNTER — Encounter: Payer: Self-pay | Admitting: Cardiovascular Disease

## 2017-04-26 ENCOUNTER — Ambulatory Visit (INDEPENDENT_AMBULATORY_CARE_PROVIDER_SITE_OTHER): Payer: Medicare PPO | Admitting: Cardiovascular Disease

## 2017-04-26 VITALS — BP 112/60 | HR 109 | Ht 73.0 in | Wt 201.0 lb

## 2017-04-26 DIAGNOSIS — I482 Chronic atrial fibrillation: Secondary | ICD-10-CM | POA: Diagnosis not present

## 2017-04-26 DIAGNOSIS — I6310 Cerebral infarction due to embolism of unspecified precerebral artery: Secondary | ICD-10-CM | POA: Diagnosis not present

## 2017-04-26 DIAGNOSIS — I10 Essential (primary) hypertension: Secondary | ICD-10-CM | POA: Diagnosis not present

## 2017-04-26 DIAGNOSIS — R0602 Shortness of breath: Secondary | ICD-10-CM | POA: Diagnosis not present

## 2017-04-26 DIAGNOSIS — E782 Mixed hyperlipidemia: Secondary | ICD-10-CM

## 2017-04-26 DIAGNOSIS — I4821 Permanent atrial fibrillation: Secondary | ICD-10-CM

## 2017-04-26 NOTE — Patient Instructions (Signed)
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
## 2017-09-24 ENCOUNTER — Ambulatory Visit (INDEPENDENT_AMBULATORY_CARE_PROVIDER_SITE_OTHER): Payer: Medicare PPO | Admitting: Cardiovascular Disease

## 2017-09-24 ENCOUNTER — Encounter: Payer: Self-pay | Admitting: Cardiovascular Disease

## 2017-09-24 VITALS — BP 112/70 | HR 97 | Ht 73.5 in | Wt 183.5 lb

## 2017-09-24 DIAGNOSIS — I4821 Permanent atrial fibrillation: Secondary | ICD-10-CM

## 2017-09-24 DIAGNOSIS — R0602 Shortness of breath: Secondary | ICD-10-CM

## 2017-09-24 DIAGNOSIS — E782 Mixed hyperlipidemia: Secondary | ICD-10-CM | POA: Diagnosis not present

## 2017-09-24 DIAGNOSIS — I10 Essential (primary) hypertension: Secondary | ICD-10-CM | POA: Diagnosis not present

## 2017-09-24 DIAGNOSIS — I482 Chronic atrial fibrillation: Secondary | ICD-10-CM

## 2017-09-24 DIAGNOSIS — I6310 Cerebral infarction due to embolism of unspecified precerebral artery: Secondary | ICD-10-CM

## 2017-09-24 NOTE — Patient Instructions (Addendum)
Medication Instructions:   Try the diltiazem in the evening More water  If still dizzy, we could cut the dose of the dilatizem  Labwork:  No new labs needed  Testing/Procedures:  No further testing at this time   Follow-Up: It was a pleasure seeing you in the office today. Please call us if you have new issues that need to be addressed before your next appt.  [phone]  Your physician wants you to follow-up in: 12 months.  You will receive a reminder letter in the mail two months in advance. If you don't receive a letter, please call our office to schedule the follow-up appointment.  If you need a refill on your cardiac medications before your next appointment, please call your pharmacy.  For educational health videos Log in to : [email or web address] Or : [email or web address], password : [email or web address]

## 2017-11-09 ENCOUNTER — Other Ambulatory Visit: Payer: Self-pay | Admitting: Cardiovascular Disease

## 2017-11-09 NOTE — Telephone Encounter (Signed)
Please review for refill. Thanks!  

## 2017-12-20 ENCOUNTER — Ambulatory Visit
Admission: EM | Admit: 2017-12-20 | Discharge: 2017-12-20 | Disposition: A | Discharge status: Home / Self Care | Payer: Medicare PPO | Attending: Family Medicine | Admitting: Family Medicine

## 2017-12-20 ENCOUNTER — Other Ambulatory Visit: Payer: Self-pay

## 2017-12-20 DIAGNOSIS — H6983 Other specified disorders of Eustachian tube, bilateral: Secondary | ICD-10-CM

## 2017-12-20 DIAGNOSIS — J069 Acute upper respiratory infection, unspecified: Secondary | ICD-10-CM | POA: Diagnosis not present

## 2017-12-20 MED ORDER — DOXYCYCLINE HYCLATE 100 MG PO CAPS: 100.0000 mg | ORAL_CAPSULE | Freq: Two times a day (BID) | ORAL | 0 refills | Status: DC

## 2017-12-20 MED ORDER — FLUTICASONE PROPIONATE 50 MCG/ACT NA SUSP: 2.0000 | Freq: Every day | NASAL | 0 refills | Status: DC

## 2017-12-20 NOTE — ED Provider Notes (Addendum)
[facility]    CSN: [id number] Arrival date & time: 12/20/17  0955     History   Chief Complaint Chief Complaint  Patient presents with  . URI    HPI Gary Dawson is a 77 y.o. male.   HPI  77 year old male presents with cold symptoms with headache, stuffiness, head pressure and sore throat with body aches that has lasted over 3 weeks.  He states that he has not been able to shake it.  He states that it will wax and wane.  Usually takes Coricidin and goes to bed and awakens much more refreshed but this has not been helping.  He sings in a choir and another participant  had some very similar symptoms which she thinks is the source of his illness.  He has had no fever.  He does not have a cough.  He does not appear ill nor toxic.  Is afebrile pulse of 90 O2 sats 100% on room air.        Past Medical History:  Diagnosis Date  . Atypical chest pain    a. 03/2012 St echo: nl EF, no wma's.  . CVA (cerebral vascular accident) (HCC)   . Elevated PSA   . Essential hypertension   . Hyperlipidemia   . MVP (mitral valve prolapse)   . Persistent atrial fibrillation    a. CHA2DS2VASc = 4-->chronic Xarelto.  . Psoriasis   . Reflux   . Vitamin B 12 deficiency     Patient Active Problem List   Diagnosis Date Noted  . Encounter for anticoagulation discussion and counseling 11/13/2014  . Essential hypertension 05/08/2013  . Hyperlipidemia 05/08/2013  . Shortness of breath 05/08/2013  . Permanent atrial fibrillation 05/06/2013  . CVA (cerebral vascular accident) (HCC) 05/06/2013    Past Surgical History:  Procedure Laterality Date  . VARICOSE VEIN SURGERY         Home Medications    Prior to Admission medications   Medication Sig Start Date End Date Taking? Authorizing Provider  AVODART 0.5 MG capsule Take 0.5 mg by mouth daily.  03/21/13  Yes [provider]  Chlorphen-PE-Acetaminophen (CORICIDIN D COLD/FLU/SINUS PO) Take by mouth.   Yes  [provider]  diltiazem (CARDIZEM CD) 180 MG 24 hr capsule Take 1 capsule (180 mg total) by mouth daily. 01/03/17  Yes Gollan, Tollie Pizza, MD  hydroxypropyl methylcellulose / hypromellose (ISOPTO TEARS / GONIOVISC) 2.5 % ophthalmic solution 1 drop as needed for dry eyes.   Yes [provider]  tamsulosin (FLOMAX) 0.4 MG CAPS capsule Take 0.4 mg by mouth.   Yes [provider]  vitamin B-12 (CYANOCOBALAMIN) 1000 MCG tablet Take 1,000 mcg by mouth daily.   Yes [provider]  XARELTO 20 MG TABS tablet TAKE 1 TABLET EVERY DAY WITH SUPPER 11/09/17  Yes Gollan, Tollie Pizza, MD  doxycycline (VIBRAMYCIN) 100 MG capsule Take 1 capsule (100 mg total) by mouth 2 (two) times daily. 12/20/17   Lutricia Feil, PA-C  fluticasone (FLONASE) 50 MCG/ACT nasal spray Place 2 sprays into both nostrils daily. 12/20/17   Lutricia Feil, PA-C    Family History Family History  Problem Relation Age of Onset  . Heart attack Father   . Hypertension Father   . Hyperlipidemia Father     Social History Social History   Tobacco Use  . Smoking status: Never Smoker  . Smokeless tobacco: Never Used  Substance Use Topics  . Alcohol use: Yes  Comment: social   . Drug use: No     Allergies   Patient has no known allergies.   Review of Systems Review of Systems  Constitutional: Positive for activity change and fatigue. Negative for appetite change, chills and diaphoresis.  HENT: Positive for congestion, postnasal drip, sinus pressure, sinus pain and sore throat.   Respiratory: Negative for cough.   All other systems reviewed and are negative.    Physical Exam Triage Vital Signs ED Triage Vitals  Enc Vitals Group     BP 12/20/17 1018 130/89     Pulse Rate 12/20/17 1018 90     Resp 12/20/17 1018 18     Temp 12/20/17 1018 98.1 F (36.7 C)     Temp Source 12/20/17 1018 Oral     SpO2 12/20/17 1018 100 %     Weight 12/20/17 1016 184 lb (83.5 kg)     Height  12/20/17 1016 6' 1.5" (1.867 m)     Head Circumference --      Peak Flow --      Pain Score 12/20/17 1016 6     Pain Loc --      Pain Edu? --      Excl. in GC? --    No data found.  Updated Vital Signs BP 130/89 (BP Location: Left Arm)   Pulse 90   Temp 98.1 F (36.7 C) (Oral)   Resp 18   Ht 6' 1.5" (1.867 m)   Wt 184 lb (83.5 kg)   SpO2 100%   BMI 23.95 kg/m   Visual Acuity Right Eye Distance:   Left Eye Distance:   Bilateral Distance:    Right Eye Near:   Left Eye Near:    Bilateral Near:     Physical Exam  Constitutional: He is oriented to person, place, and time. He appears well-developed and well-nourished. No distress.  HENT:  Head: Normocephalic.  Right Ear: External ear normal.  Left Ear: External ear normal.  Nose: Nose normal.  Mouth/Throat: Oropharynx is clear and moist. No oropharyngeal exudate.  He has a moderate burden of the wax in the canals.  TMs that can be seen appear very dull and dark  Eyes: Pupils are equal, round, and reactive to light. Right eye exhibits no discharge. Left eye exhibits no discharge.  Neck: Normal range of motion.  Pulmonary/Chest: Effort normal and breath sounds normal.  Musculoskeletal: Normal range of motion.  Lymphadenopathy:    He has no cervical adenopathy.  Neurological: He is alert and oriented to person, place, and time.  Skin: Skin is warm and dry. He is not diaphoretic.  Psychiatric: He has a normal mood and affect. His behavior is normal. Judgment and thought content normal.  Nursing note and vitals reviewed.    UC Treatments / Results  Labs (all labs ordered are listed, but only abnormal results are displayed) Labs Reviewed - No data to display  EKG None  Radiology No results found.  Procedures Procedures (including critical care time)  Medications Ordered in UC Medications - No data to display  Initial Impression / Assessment and Plan / UC Course  I have reviewed the triage vital signs and the  nursing notes.  Pertinent labs & imaging results that were available during my care of the patient were reviewed by me and considered in my medical decision making (see chart for details).     Since he has had the illness for at least 3 weeks I feel that antibiotic is  indicated.  We will provide him with a broad-spectrum antibiotic.  I have also recommended the use of Flonase on a daily basis for the next 2 to 3 weeks because of the eustachian tube dysfunction.  He understands that this have to run a course.  He can use Tylenol or Motrin as necessary for body aches.  If he is not improving he should follow-up with his primary care physician Final Clinical Impressions(s) / UC Diagnoses   Final diagnoses:  Upper respiratory tract infection, unspecified type  Eustachian tube dysfunction, bilateral     Discharge Instructions     Use the Flonase 2 sprays daily for 2 to 3 weeks    ED Prescriptions    Medication Sig Dispense Auth. Provider   doxycycline (VIBRAMYCIN) 100 MG capsule Take 1 capsule (100 mg total) by mouth 2 (two) times daily. 14 capsule Ovid Curd P, PA-C   fluticasone (FLONASE) 50 MCG/ACT nasal spray Place 2 sprays into both nostrils daily. 16 g Lutricia Feil, PA-C     Controlled Substance Prescriptions [place] Controlled Substance Registry consulted? Not Applicable   Lutricia Feil, PA-C 12/20/17 1120    Lutricia Feil, PA-C 12/20/17 1121

## 2017-12-20 NOTE — ED Triage Notes (Signed)
Patient complains of cold symptoms, headache, stuffiness, head pressure, sore throat, slight body aches x 3 weeks. Patient states that he just doesn't feel like he is able to shake this.

## 2017-12-20 NOTE — Discharge Instructions (Signed)
Use the Flonase 2 sprays daily for 2 to 3 weeks

## 2018-01-08 ENCOUNTER — Other Ambulatory Visit: Payer: Self-pay | Admitting: Cardiovascular Disease

## 2018-01-16 ENCOUNTER — Other Ambulatory Visit: Payer: Self-pay

## 2018-01-16 ENCOUNTER — Encounter: Payer: Self-pay | Admitting: Emergency Medicine

## 2018-01-16 ENCOUNTER — Ambulatory Visit
Admission: EM | Admit: 2018-01-16 | Discharge: 2018-01-16 | Disposition: A | Discharge status: Home / Self Care | Payer: Medicare PPO | Attending: Physician Assistant | Admitting: Physician Assistant

## 2018-01-16 DIAGNOSIS — J029 Acute pharyngitis, unspecified: Secondary | ICD-10-CM | POA: Diagnosis not present

## 2018-01-16 DIAGNOSIS — R0981 Nasal congestion: Secondary | ICD-10-CM | POA: Insufficient documentation

## 2018-01-16 DIAGNOSIS — J309 Allergic rhinitis, unspecified: Secondary | ICD-10-CM | POA: Insufficient documentation

## 2018-01-16 MED ORDER — FLUTICASONE PROPIONATE 50 MCG/ACT NA SUSP: 1.0000 | Freq: Two times a day (BID) | NASAL | 0 refills | Status: DC

## 2018-01-16 MED ORDER — CETIRIZINE HCL 10 MG PO TABS: 10.0000 mg | ORAL_TABLET | Freq: Every day | ORAL | 0 refills | Status: DC

## 2018-01-16 NOTE — ED Provider Notes (Signed)
[facility]    CSN: [id number] Arrival date & time: 01/16/18  1414     History   Chief Complaint Chief Complaint  Patient presents with  . Nasal Congestion    HPI Gary Dawson is a 78 y.o. male. Patient presents for 1 month history of nasal congestion that comes and goes. He admits to feeling somewhat fatigued and achy as well. Admits to mild sore throat. He denies fever, headache, ear pain, facial pain, sinus pressure/pain, cough, shortness of breath, chest pain. He does have a history of allergic rhinitis. He says that he took half of a doxycycline prescription a couple of weeks ago, but has not taken any OTC meds. He denies worsening of symptoms and states that they go away and then come back.   HPI  Past Medical History:  Diagnosis Date  . Atypical chest pain    a. 03/2012 St echo: nl EF, no wma's.  . CVA (cerebral vascular accident) (HCC)   . Elevated PSA   . Essential hypertension   . Hyperlipidemia   . MVP (mitral valve prolapse)   . Persistent atrial fibrillation    a. CHA2DS2VASc = 4-->chronic Xarelto.  . Psoriasis   . Reflux   . Vitamin B 12 deficiency     Patient Active Problem List   Diagnosis Date Noted  . Encounter for anticoagulation discussion and counseling 11/13/2014  . Essential hypertension 05/08/2013  . Hyperlipidemia 05/08/2013  . Shortness of breath 05/08/2013  . Permanent atrial fibrillation 05/06/2013  . CVA (cerebral vascular accident) (HCC) 05/06/2013    Past Surgical History:  Procedure Laterality Date  . VARICOSE VEIN SURGERY         Home Medications    Prior to Admission medications   Medication Sig Start Date End Date Taking? Authorizing Provider  AVODART 0.5 MG capsule Take 0.5 mg by mouth daily.  03/21/13  Yes [provider]  Chlorphen-PE-Acetaminophen (CORICIDIN D COLD/FLU/SINUS PO) Take by mouth.   Yes [provider]  diltiazem (CARDIZEM CD) 180 MG 24 hr capsule TAKE 1 CAPSULE EVERY  DAY 01/08/18  Yes Gollan, Tollie Pizzaimothy J, MD  tamsulosin (FLOMAX) 0.4 MG CAPS capsule Take 0.4 mg by mouth.   Yes [provider]  vitamin B-12 (CYANOCOBALAMIN) 1000 MCG tablet Take 1,000 mcg by mouth daily.   Yes [provider]  XARELTO 20 MG TABS tablet TAKE 1 TABLET EVERY DAY WITH SUPPER 11/09/17  Yes Gollan, Tollie Pizzaimothy J, MD  cetirizine (ZYRTEC) 10 MG tablet Take 1 tablet (10 mg total) by mouth daily for 15 days. 01/16/18 01/31/18  Eusebio FriendlyEaves, Taje Tondreau B, PA-C  doxycycline (VIBRAMYCIN) 100 MG capsule Take 1 capsule (100 mg total) by mouth 2 (two) times daily. 12/20/17   Lutricia Feiloemer, William P, PA-C  fluticasone (FLONASE) 50 MCG/ACT nasal spray Place 1 spray into both nostrils 2 (two) times daily for 10 days. 01/16/18 01/26/18  Eusebio FriendlyEaves, Geraldina Parrott B, PA-C  hydroxypropyl methylcellulose / hypromellose (ISOPTO TEARS / GONIOVISC) 2.5 % ophthalmic solution 1 drop as needed for dry eyes.    [provider]    Family History Family History  Problem Relation Age of Onset  . Heart attack Father   . Hypertension Father   . Hyperlipidemia Father     Social History Social History   Tobacco Use  . Smoking status: Never Smoker  . Smokeless tobacco: Never Used  Substance Use Topics  . Alcohol use: Yes    Comment: social   . Drug use: No  Allergies   Patient has no known allergies.   Review of Systems Review of Systems  Constitutional: Positive for fatigue. Negative for chills and fever.  HENT: Positive for congestion, postnasal drip, rhinorrhea and sore throat. Negative for ear pain, sinus pressure, sinus pain and trouble swallowing.   Eyes: Negative for discharge, redness and itching.  Respiratory: Negative for cough and shortness of breath.   Cardiovascular: Negative for chest pain.  Gastrointestinal: Negative for abdominal pain, nausea and vomiting.  Musculoskeletal: Positive for myalgias. Negative for arthralgias and neck pain.  Skin: Negative for color change and rash.    Allergic/Immunologic: Positive for environmental allergies.  Neurological: Negative for dizziness, weakness, light-headedness and headaches.  Hematological: Negative for adenopathy.     Physical Exam Triage Vital Signs ED Triage Vitals  Enc Vitals Group     BP 01/16/18 1512 129/83     Pulse Rate 01/16/18 1512 96     Resp 01/16/18 1512 18     Temp 01/16/18 1512 98.3 F (36.8 C)     Temp Source 01/16/18 1512 Oral     SpO2 01/16/18 1512 100 %     Weight 01/16/18 1510 185 lb (83.9 kg)     Height 01/16/18 1510 6\' 1"  (1.854 m)     Head Circumference --      Peak Flow --      Pain Score 01/16/18 1510 0     Pain Loc --      Pain Edu? --      Excl. in GC? --    No data found.  Updated Vital Signs BP 129/83 (BP Location: Left Arm)   Pulse 96   Temp 98.3 F (36.8 C) (Oral)   Resp 18   Ht 6\' 1"  (1.854 m)   Wt 185 lb (83.9 kg)   SpO2 100%   BMI 24.41 kg/m       Physical Exam Vitals signs and nursing note reviewed.  Constitutional:      General: He is not in acute distress.    Appearance: Normal appearance. He is normal weight. He is not ill-appearing or toxic-appearing.  HENT:     Head: Normocephalic and atraumatic.     Right Ear: Tympanic membrane, ear canal and external ear normal.     Left Ear: Tympanic membrane, ear canal and external ear normal.     Nose: Congestion and rhinorrhea (moderate clear drainage) present.     Mouth/Throat:     Mouth: Mucous membranes are moist.     Pharynx: Oropharynx is clear. No posterior oropharyngeal erythema ( positive clear PND).  Eyes:     General: No scleral icterus.       Right eye: No discharge.        Left eye: No discharge.  Neck:     Musculoskeletal: Normal range of motion and neck supple.  Cardiovascular:     Rate and Rhythm: Normal rate.     Heart sounds: No murmur.  Pulmonary:     Effort: Pulmonary effort is normal. No respiratory distress.     Breath sounds: No wheezing, rhonchi or rales.  Lymphadenopathy:      Cervical: No cervical adenopathy.  Skin:    General: Skin is warm and dry.     Findings: No rash.  Neurological:     Mental Status: He is alert and oriented to person, place, and time.     Motor: No weakness.     Gait: Gait normal.  Psychiatric:  Mood and Affect: Mood normal.        Behavior: Behavior normal.        Thought Content: Thought content normal.      UC Treatments / Results  Labs (all labs ordered are listed, but only abnormal results are displayed) Labs Reviewed - No data to display  EKG None  Radiology No results found.  Procedures Procedures (including critical care time)  Medications Ordered in UC Medications - No data to display  Initial Impression / Assessment and Plan / UC Course  I have reviewed the triage vital signs and the nursing notes.  Pertinent labs & imaging results that were available during my care of the patient were reviewed by me and considered in my medical decision making (see chart for details).    Final Clinical Impressions(s) / UC Diagnoses   Final diagnoses:  Allergic rhinitis, unspecified seasonality, unspecified trigger  Nasal congestion     Discharge Instructions     NASAL CONGESTION: Your nasal congestion is likely related to allergic rhinitis. Begin daily cetirizine and Flonase twice daily. Begin nasal saline rinses. Take Coricidin at bed time if needed. Increase rest and fluid intake. Please follow up with your PCP if this does not improve symptoms in the next week. If you begin to have fever, worsening fatigue, headaches, facial pain, dizziness--please return as these can be signs of bacterial sinus infection which may need an antibiotic.     ED Prescriptions    Medication Sig Dispense Auth. Provider   cetirizine (ZYRTEC) 10 MG tablet Take 1 tablet (10 mg total) by mouth daily for 15 days. 15 tablet Eusebio Friendly B, PA-C   fluticasone (FLONASE) 50 MCG/ACT nasal spray Place 1 spray into both nostrils 2 (two)  times daily for 10 days. 1 g Shirlee Latch, PA-C     Controlled Substance Prescriptions [place] Controlled Substance Registry consulted? Not Applicable   Gareth Morgan 01/17/18 2059

## 2018-01-16 NOTE — ED Triage Notes (Signed)
Patient c/o nasal congestion and drainage that started 2 months ago. Patient states he was treated with Doxycycline starting 12/5 but he didn't take the medication as prescribed. He took the medication once daily. He stated he has been able to get rid of his symptoms completely and is requesting another prescription for the Doxycyline.

## 2018-01-16 NOTE — Discharge Instructions (Addendum)
NASAL CONGESTION: Your nasal congestion is likely related to allergic rhinitis. Begin daily cetirizine and Flonase twice daily. Begin nasal saline rinses. Take Coricidin at bed time if needed. Increase rest and fluid intake. Please follow up with your PCP if this does not improve symptoms in the next week. If you begin to have fever, worsening fatigue, headaches, facial pain, dizziness--please return as these can be signs of bacterial sinus infection which may need an antibiotic.

## 2018-01-31 ENCOUNTER — Other Ambulatory Visit: Payer: Self-pay

## 2018-01-31 ENCOUNTER — Ambulatory Visit
Admission: EM | Admit: 2018-01-31 | Discharge: 2018-01-31 | Disposition: A | Discharge status: Home / Self Care | Payer: Medicare PPO | Attending: Family Medicine | Admitting: Family Medicine

## 2018-01-31 ENCOUNTER — Encounter: Payer: Self-pay | Admitting: Emergency Medicine

## 2018-01-31 DIAGNOSIS — S161XXA Strain of muscle, fascia and tendon at neck level, initial encounter: Secondary | ICD-10-CM | POA: Diagnosis not present

## 2018-01-31 DIAGNOSIS — Y93B9 Activity, other involving muscle strengthening exercises: Secondary | ICD-10-CM

## 2018-01-31 MED ORDER — METHYLPREDNISOLONE 4 MG PO TBPK: ORAL_TABLET | ORAL | 0 refills | Status: DC

## 2018-01-31 MED ORDER — TIZANIDINE HCL 4 MG PO CAPS: 4.0000 mg | ORAL_CAPSULE | Freq: Three times a day (TID) | ORAL | 0 refills | Status: DC

## 2018-01-31 NOTE — ED Provider Notes (Signed)
[facility]    CSN: [id number] Arrival date & time: 01/31/18  1459     History   Chief Complaint Chief Complaint  Patient presents with  . Neck Pain    HPI Rhyan Ammann [place] is a 78 y.o. male.   HPI  78 year old gentleman presents with a 2 night history of right-sided neck pain without significant radicular symptoms.  He states that he has had this before.  He thinks he may have slept wrong.  With further questioning the patient states he has recently started exercising and has been using the gym machines more than what he should have.  States that since that time is been having the symptoms mostly right-sided.  He has had decreased range of motion of his neck.         Past Medical History:  Diagnosis Date  . Atypical chest pain    a. 03/2012 St echo: nl EF, no wma's.  . CVA (cerebral vascular accident) (HCC)   . Elevated PSA   . Essential hypertension   . Hyperlipidemia   . MVP (mitral valve prolapse)   . Persistent atrial fibrillation    a. CHA2DS2VASc = 4-->chronic Xarelto.  . Psoriasis   . Reflux   . Vitamin B 12 deficiency     Patient Active Problem List   Diagnosis Date Noted  . Encounter for anticoagulation discussion and counseling 11/13/2014  . Essential hypertension 05/08/2013  . Hyperlipidemia 05/08/2013  . Shortness of breath 05/08/2013  . Permanent atrial fibrillation 05/06/2013  . CVA (cerebral vascular accident) (HCC) 05/06/2013    Past Surgical History:  Procedure Laterality Date  . VARICOSE VEIN SURGERY         Home Medications    Prior to Admission medications   Medication Sig Start Date End Date Taking? Authorizing Provider  AVODART 0.5 MG capsule Take 0.5 mg by mouth daily.  03/21/13  Yes [provider]  cetirizine (ZYRTEC) 10 MG tablet Take 1 tablet (10 mg total) by mouth daily for 15 days. 01/16/18 01/31/18 Yes Shirlee Latch, PA-C  diltiazem (CARDIZEM CD) 180 MG 24 hr capsule TAKE 1 CAPSULE EVERY DAY  01/08/18  Yes Gollan, Tollie Pizza, MD  fluticasone (FLONASE) 50 MCG/ACT nasal spray Place 1 spray into both nostrils 2 (two) times daily for 10 days. 01/16/18 01/31/18 Yes Shirlee Latch, PA-C  tamsulosin (FLOMAX) 0.4 MG CAPS capsule Take 0.4 mg by mouth.   Yes [provider]  vitamin B-12 (CYANOCOBALAMIN) 1000 MCG tablet Take 1,000 mcg by mouth daily.   Yes [provider]  XARELTO 20 MG TABS tablet TAKE 1 TABLET EVERY DAY WITH SUPPER 11/09/17  Yes Gollan, Tollie Pizza, MD  methylPREDNISolone (MEDROL DOSEPAK) 4 MG TBPK tablet Take per package instructions 01/31/18   Lutricia Feil, PA-C  tiZANidine (ZANAFLEX) 4 MG capsule Take 1 capsule (4 mg total) by mouth 3 (three) times daily. 01/31/18   Lutricia Feil, PA-C    Family History Family History  Problem Relation Age of Onset  . Heart attack Father   . Hypertension Father   . Hyperlipidemia Father     Social History Social History   Tobacco Use  . Smoking status: Never Smoker  . Smokeless tobacco: Never Used  Substance Use Topics  . Alcohol use: Yes    Comment: social   . Drug use: No     Allergies   Patient has no known allergies.   Review of Systems Review of Systems  Constitutional: Positive  for activity change. Negative for appetite change, chills and fatigue.  Musculoskeletal: Positive for neck pain and neck stiffness.  All other systems reviewed and are negative.    Physical Exam Triage Vital Signs ED Triage Vitals  Enc Vitals Group     BP 01/31/18 1509 127/78     Pulse Rate 01/31/18 1509 70     Resp 01/31/18 1509 18     Temp 01/31/18 1509 98.1 F (36.7 C)     Temp Source 01/31/18 1509 Oral     SpO2 01/31/18 1509 100 %     Weight 01/31/18 1507 185 lb (83.9 kg)     Height 01/31/18 1507 6\' 1"  (1.854 m)     Head Circumference --      Peak Flow --      Pain Score 01/31/18 1507 7     Pain Loc --      Pain Edu? --      Excl. in GC? --    No data found.  Updated Vital Signs BP 127/78 (BP  Location: Right Arm)   Pulse 70   Temp 98.1 F (36.7 C) (Oral)   Resp 18   Ht 6\' 1"  (1.854 m)   Wt 185 lb (83.9 kg)   SpO2 100%   BMI 24.41 kg/m   Visual Acuity Right Eye Distance:   Left Eye Distance:   Bilateral Distance:    Right Eye Near:   Left Eye Near:    Bilateral Near:     Physical Exam Vitals signs and nursing note reviewed.  Constitutional:      General: He is not in acute distress.    Appearance: Normal appearance. He is normal weight. He is not ill-appearing, toxic-appearing or diaphoretic.  HENT:     Head: Normocephalic.     Nose: Nose normal. No congestion or rhinorrhea.     Mouth/Throat:     Mouth: Mucous membranes are moist.     Pharynx: No oropharyngeal exudate or posterior oropharyngeal erythema.  Eyes:     General:        Right eye: No discharge.        Left eye: No discharge.     Conjunctiva/sclera: Conjunctivae normal.  Neck:     Musculoskeletal: Muscular tenderness present.     Comments: Of the cervical spine shows marked decreased range of motion to extension and rightward rotation.  Is to palpation along the paraspinous muscles on the right leading into the trapezius muscles and intrascapular.  Extremity strength and sensation are intact to light touch.  DTRs are 2+/4 bilaterally symmetrical. Musculoskeletal: Normal range of motion.  Skin:    General: Skin is warm and dry.  Neurological:     General: No focal deficit present.     Mental Status: He is alert and oriented to person, place, and time.  Psychiatric:        Mood and Affect: Mood normal.        Behavior: Behavior normal.        Thought Content: Thought content normal.        Judgment: Judgment normal.      UC Treatments / Results  Labs (all labs ordered are listed, but only abnormal results are displayed) Labs Reviewed - No data to display  EKG None  Radiology No results found.  Procedures Procedures (including critical care time)  Medications Ordered in  UC Medications - No data to display  Initial Impression / Assessment and Plan / UC Course  I  have reviewed the triage vital signs and the nursing notes.  Pertinent labs & imaging results that were available during my care of the patient were reviewed by me and considered in my medical decision making (see chart for details).   Patient has an acute cervical strain possibly from overexertion while performing exercises on a weight machine.  Start him on Medrol Dosepak due to him being on Xarelto.  Will prescribe a Zanaflex for muscle spasm but have cautioned him with its use.  Ice for comfort.  Follow-up with his primary if he is not improving   Final Clinical Impressions(s) / UC Diagnoses   Final diagnoses:  Acute strain of neck muscle, initial encounter     Discharge Instructions     Apply ice 20 minutes out of every 2 hours 4-5 times daily for comfort.    ED Prescriptions    Medication Sig Dispense Auth. Provider   tiZANidine (ZANAFLEX) 4 MG capsule Take 1 capsule (4 mg total) by mouth 3 (three) times daily. 21 capsule Ovid Curd P, PA-C   methylPREDNISolone (MEDROL DOSEPAK) 4 MG TBPK tablet Take per package instructions 21 tablet Lutricia Feil, PA-C     Controlled Substance Prescriptions [place] Controlled Substance Registry consulted? Not Applicable   Lutricia Feil, PA-C 01/31/18 1611

## 2018-01-31 NOTE — Discharge Instructions (Addendum)
Apply ice 20 minutes out of every 2 hours 4-5 times daily for comfort.  °

## 2018-01-31 NOTE — ED Triage Notes (Signed)
Patient stated he slept wrong 2 nights ago and now has right side neck pain. Patient states he has had this before.

## 2018-04-06 ENCOUNTER — Other Ambulatory Visit: Payer: Self-pay | Admitting: Cardiovascular Disease

## 2018-04-08 NOTE — Telephone Encounter (Signed)
Refill Request.  

## 2018-06-26 ENCOUNTER — Other Ambulatory Visit: Payer: Self-pay | Admitting: Cardiovascular Disease

## 2018-06-26 NOTE — Telephone Encounter (Signed)
Refill Request.  

## 2018-07-26 ENCOUNTER — Other Ambulatory Visit: Payer: Self-pay

## 2018-07-26 DIAGNOSIS — R972 Elevated prostate specific antigen [PSA]: Secondary | ICD-10-CM

## 2018-07-30 ENCOUNTER — Ambulatory Visit (INDEPENDENT_AMBULATORY_CARE_PROVIDER_SITE_OTHER): Payer: Medicare PPO | Admitting: Urology

## 2018-07-30 ENCOUNTER — Encounter: Payer: Self-pay | Admitting: Urology

## 2018-07-30 ENCOUNTER — Other Ambulatory Visit: Payer: Self-pay

## 2018-07-30 ENCOUNTER — Other Ambulatory Visit
Admission: RE | Admit: 2018-07-30 | Discharge: 2018-07-30 | Disposition: A | Discharge status: Home / Self Care | Payer: Medicare PPO | Attending: Urology | Admitting: Urology

## 2018-07-30 VITALS — BP 140/85 | HR 86 | Ht 73.0 in | Wt 186.0 lb

## 2018-07-30 DIAGNOSIS — N401 Enlarged prostate with lower urinary tract symptoms: Secondary | ICD-10-CM | POA: Diagnosis not present

## 2018-07-30 DIAGNOSIS — R350 Frequency of micturition: Secondary | ICD-10-CM

## 2018-07-30 DIAGNOSIS — R972 Elevated prostate specific antigen [PSA]: Secondary | ICD-10-CM | POA: Diagnosis not present

## 2018-07-30 LAB — URINALYSIS, COMPLETE (UACMP) WITH MICROSCOPIC
Bacteria, UA: NONE SEEN
Glucose, UA: NEGATIVE mg/dL
Leukocytes,Ua: NEGATIVE
Nitrite: NEGATIVE
Protein, ur: NEGATIVE mg/dL
Specific Gravity, Urine: >1.03 — ABNORMAL HIGH (ref 1.005–1.030)
pH: 5 (ref 5.0–8.0)

## 2018-07-30 LAB — BLADDER SCAN AMB NON-IMAGING

## 2018-07-30 NOTE — Patient Instructions (Signed)
Prostate Cancer Screening  The prostate is a walnut-sized gland that is located below the bladder and in front of the rectum in males. The function of the prostate (prostate gland) is to add fluid to semen during ejaculation. Prostate cancer is the second most common type of cancer in men. A screening test for cancer is a test that is done before cancer symptoms start. Screening can help to identify cancer at an early stage, when the cancer can be treated more easily. The recommended prostate cancer screening test is a blood test called the prostate-specific antigen (PSA) test. PSA is a protein that is made in the prostate. As you age, your prostate naturally produces more PSA. Abnormally high PSA levels may be caused by:  Prostate cancer.  An enlarged prostate that is not caused by cancer (benign prostatic hyperplasia, BPH). This condition is very common in older men.  A prostate gland infection (prostatitis).  Medicines to assist with hair growth, such as finasteride. Depending on the PSA results, you may need more tests, such as:  A physical exam to check the size of your prostate gland.  Blood and imaging tests.  A procedure to remove tissue samples from your prostate gland for testing (biopsy). Who should have screening? Screening recommendations vary based on age.  If you are younger than age 40, screening is not recommended.  If you are age 40-54 and you have no risk factors, screening is not recommended.  If you are younger than age 55, ask your health care provider if you need screening if you have one of these risk factors: ? Being of African-American descent. ? Having a family history of prostate cancer.  If you are age 55-69, talk with your health care provider about your need for screening and how often screening should be done.  If you are older than age 70, screening is not recommended. This is because the risks that screening can cause are greater than the benefits  that it may provide (risks outweigh the benefits). If you are at high risk for prostate cancer, your health care provider may recommend that you have screenings more often or start screening at a younger age. You may be at high risk if you:  Are older than age 55.  Are African-American.  Have a father, brother, or uncle who has been diagnosed with prostate cancer. The risk may be higher if your family member's cancer occurred at an early age. What are the benefits of screening? There is a small chance that screening may lower your risk of dying from prostate cancer. The chance is small because prostate cancer is typically a slow-growing cancer, and most men with prostate cancer die from a different cause. What are the risks of screening? The main risk of prostate cancer screening is diagnosing and treating prostate cancer that would never have caused any symptoms or problems (overdiagnosis and overtreatment). PSA screening cannot tell you if your PSA is high due to cancer or a different cause. A prostate biopsy is the only procedure to diagnose prostate cancer. Even the results of a biopsy may not tell you if your cancer needs to be treated. Slow-growing prostate cancer may not need any treatment other than monitoring, so diagnosing and treating it may cause unnecessary stress or other side effects. A prostate biopsy may also cause:  Infection or fever.  A false negative. This is a result that shows that you do not have prostate cancer when you actually do have prostate cancer. Questions   to ask your health care provider  When should I start prostate cancer screening?  What is my risk for prostate cancer?  How often do I need screening?  What type of screening tests do I need?  How do I get my test results?  What do my results mean?  Do I need treatment? Contact a health care provider if:  You have difficulty urinating.  You have pain when you urinate or ejaculate.  You have  blood in your urine or semen.  You have pain in your back or in the area of your prostate.  You have trouble getting or maintaining an erection (erectile dysfunction, ED). Summary  Prostate cancer is a common type of cancer in men. The prostate (prostate gland) is located below the bladder and in front of the rectum. This gland adds fluid to semen during ejaculation.  Prostate cancer screening may identify cancer at an early stage, when the cancer can be treated more easily.  The prostate-specific antigen (PSA) test is the recommended screening test for prostate cancer.  Discuss the risks and benefits of prostate cancer screening with your health care provider. If you are age 66 or older, screening is likely to lead to more risks than benefits (risks outweigh the benefits). This information is not intended to replace advice given to you by your health care provider. Make sure you discuss any questions you have with your health care provider. Document Released: 10/13/2016 Document Revised: 12/15/2016 Document Reviewed: 10/13/2016 Elsevier Patient Education  2020 [employer]   Benign Prostatic Hyperplasia  Benign prostatic hyperplasia (BPH) is an enlarged prostate gland that is caused by the normal aging process and not by cancer. The prostate is a walnut-sized gland that is involved in the production of semen. It is located in front of the rectum and below the bladder. The bladder stores urine and the urethra is the tube that carries the urine out of the body. The prostate may get bigger as a man gets older. An enlarged prostate can press on the urethra. This can make it harder to pass urine. The build-up of urine in the bladder can cause infection. Back pressure and infection may progress to bladder damage and kidney (renal) failure. What are the causes? This condition is part of a normal aging process. However, not all men develop problems from this condition. If the prostate enlarges  away from the urethra, urine flow will not be blocked. If it enlarges toward the urethra and compresses it, there will be problems passing urine. What increases the risk? This condition is more likely to develop in men over the age of 50 years. What are the signs or symptoms? Symptoms of this condition include:  Getting up often during the night to urinate.  Needing to urinate frequently during the day.  Difficulty starting urine flow.  Decrease in size and strength of your urine stream.  Leaking (dribbling) after urinating.  Inability to pass urine. This needs immediate treatment.  Inability to completely empty your bladder.  Pain when you pass urine. This is more common if there is also an infection.  Urinary tract infection (UTI). How is this diagnosed? This condition is diagnosed based on your medical history, a physical exam, and your symptoms. Tests will also be done, such as:  A post-void bladder scan. This measures any amount of urine that may remain in your bladder after you finish urinating.  A digital rectal exam. In a rectal exam, your health care provider checks your  prostate by putting a lubricated, gloved finger into your rectum to feel the back of your prostate gland. This exam detects the size of your gland and any abnormal lumps or growths.  An exam of your urine (urinalysis).  A prostate specific antigen (PSA) screening. This is a blood test used to screen for prostate cancer.  An ultrasound. This test uses sound waves to electronically produce a picture of your prostate gland. Your health care provider may refer you to a specialist in kidney and prostate diseases (urologist). How is this treated? Once symptoms begin, your health care provider will monitor your condition (active surveillance or watchful waiting). Treatment for this condition will depend on the severity of your condition. Treatment may include:  Observation and yearly exams. This may be the  only treatment needed if your condition and symptoms are mild.  Medicines to relieve your symptoms, including: ? Medicines to shrink the prostate. ? Medicines to relax the muscle of the prostate.  Surgery in severe cases. Surgery may include: ? Prostatectomy. In this procedure, the prostate tissue is removed completely through an open incision or with a laparoscope or robotics. ? Transurethral resection of the prostate (TURP). In this procedure, a tool is inserted through the opening at the tip of the penis (urethra). It is used to cut away tissue of the inner core of the prostate. The pieces are removed through the same opening of the penis. This removes the blockage. ? Transurethral incision (TUIP). In this procedure, small cuts are made in the prostate. This lessens the prostate's pressure on the urethra. ? Transurethral microwave thermotherapy (TUMT). This procedure uses microwaves to create heat. The heat destroys and removes a small amount of prostate tissue. ? Transurethral needle ablation (TUNA). This procedure uses radio frequencies to destroy and remove a small amount of prostate tissue. ? Interstitial laser coagulation ([place]). This procedure uses a laser to destroy and remove a small amount of prostate tissue. ? Transurethral electrovaporization (TUVP). This procedure uses electrodes to destroy and remove a small amount of prostate tissue. ? Prostatic urethral lift. This procedure inserts an implant to push the lobes of the prostate away from the urethra. Follow these instructions at home:  Take over-the-counter and prescription medicines only as told by your health care provider.  Monitor your symptoms for any changes. Contact your health care provider with any changes.  Avoid drinking large amounts of liquid before going to bed or out in public.  Avoid or reduce how much caffeine or alcohol you drink.  Give yourself time when you urinate.  Keep all follow-up visits as told by  your health care provider. This is important. Contact a health care provider if:  You have unexplained back pain.  Your symptoms do not get better with treatment.  You develop side effects from the medicine you are taking.  Your urine becomes very dark or has a bad smell.  Your lower abdomen becomes distended and you have trouble passing your urine. Get help right away if:  You have a fever or chills.  You suddenly cannot urinate.  You feel lightheaded, or very dizzy, or you faint.  There are large amounts of blood or clots in the urine.  Your urinary problems become hard to manage.  You develop moderate to severe low back or flank pain. The flank is the side of your body between the ribs and the hip. These symptoms may represent a serious problem that is an emergency. Do not wait to see if  the symptoms will go away. Get medical help right away. Call your local emergency services (911 in the [place] Do not drive yourself to the hospital. Summary  Benign prostatic hyperplasia (BPH) is an enlarged prostate that is caused by the normal aging process and not by cancer.  An enlarged prostate can press on the urethra. This can make it hard to pass urine.  This condition is part of a normal aging process and is more likely to develop in men over the age of 50 years.  Get help right away if you suddenly cannot urinate. This information is not intended to replace advice given to you by your health care provider. Make sure you discuss any questions you have with your health care provider. Document Released: 01/02/2005 Document Revised: 11/27/2017 Document Reviewed: 02/07/2016 Elsevier Patient Education  2020 [employer]   Overactive Bladder, Adult  Overactive bladder refers to a condition in which a person has a sudden need to pass urine. The person may leak urine if he or she cannot get to the bathroom fast enough (urinary incontinence). A person with this condition may also wake  up several times in the night to go to the bathroom. Overactive bladder is associated with poor nerve signals between your bladder and your brain. Your bladder may get the signal to empty before it is full. You may also have very sensitive muscles that make your bladder squeeze too soon. These symptoms might interfere with daily work or social activities. What are the causes? This condition may be associated with or caused by:  Urinary tract infection.  Infection of nearby tissues, such as the prostate.  Prostate enlargement.  Surgery on the uterus or urethra.  Bladder stones, inflammation, or tumors.  Drinking too much caffeine or alcohol.  Certain medicines, especially medicines that get rid of extra fluid in the body (diuretics).  Muscle or nerve weakness, especially from: ? A spinal cord injury. ? Stroke. ? Multiple sclerosis. ? Parkinson's disease.  Diabetes.  Constipation. What increases the risk? You may be at greater risk for overactive bladder if you:  Are an older adult.  Smoke.  Are going through menopause.  Have prostate problems.  Have a neurological disease, such as stroke, dementia, Parkinson's disease, or multiple sclerosis (MS).  Eat or drink things that irritate the bladder. These include alcohol, spicy food, and caffeine.  Are overweight or obese. What are the signs or symptoms? Symptoms of this condition include:  Sudden, strong urge to urinate.  Leaking urine.  Urinating 8 or more times a day.  Waking up to urinate 2 or more times a night. How is this diagnosed? Your health care provider may suspect overactive bladder based on your symptoms. He or she will diagnose this condition by:  A physical exam and medical history.  Blood or urine tests. You might need bladder or urine tests to help determine what is causing your overactive bladder. You might also need to see a health care provider who specializes in urinary tract problems  (urologist). How is this treated? Treatment for overactive bladder depends on the cause of your condition and whether it is mild or severe. You can also make lifestyle changes at home. Options include:  Bladder training. This may include: ? Learning to control the urge to urinate by following a schedule that directs you to urinate at regular intervals (timed voiding). ? Doing Kegel exercises to strengthen your pelvic floor muscles, which support your bladder. Toning these muscles can help you control  urination, even if your bladder muscles are overactive.  Special devices. This may include: ? Biofeedback, which uses sensors to help you become aware of your body's signals. ? Electrical stimulation, which uses electrodes placed inside the body (implanted) or outside the body. These electrodes send gentle pulses of electricity to strengthen the nerves or muscles that control the bladder. ? Women may use a plastic device that fits into the vagina and supports the bladder (pessary).  Medicines. ? Antibiotics to treat bladder infection. ? Antispasmodics to stop the bladder from releasing urine at the wrong time. ? Tricyclic antidepressants to relax bladder muscles. ? Injections of botulinum toxin type A directly into the bladder tissue to relax bladder muscles.  Lifestyle changes. This may include: ? Weight loss. Talk to your health care provider about weight loss methods that would work best for you. ? Diet changes. This may include reducing how much alcohol and caffeine you consume, or drinking fluids at different times of the day. ? Not smoking. Do not use any products that contain nicotine or tobacco, such as cigarettes and e-cigarettes. If you need help quitting, ask your health care provider.  Surgery. ? A device may be implanted to help manage the nerve signals that control urination. ? An electrode may be implanted to stimulate electrical signals in the bladder. ? A procedure may be done  to change the shape of the bladder. This is done only in very severe cases. Follow these instructions at home: Lifestyle  Make any diet or lifestyle changes that are recommended by your health care provider. These may include: ? Drinking less fluid or drinking fluids at different times of the day. ? Cutting down on caffeine or alcohol. ? Doing Kegel exercises. ? Losing weight if needed. ? Eating a healthy and balanced diet to prevent constipation. This may include:  Eating foods that are high in fiber, such as fresh fruits and vegetables, whole grains, and beans.  Limiting foods that are high in fat and processed sugars, such as fried and sweet foods. General instructions  Take over-the-counter and prescription medicines only as told by your health care provider.  If you were prescribed an antibiotic medicine, take it as told by your health care provider. Do not stop taking the antibiotic even if you start to feel better.  Use any implants or pessary as told by your health care provider.  If needed, wear pads to absorb urine leakage.  Keep a journal or log to track how much and when you drink and when you feel the need to urinate. This will help your health care provider monitor your condition.  Keep all follow-up visits as told by your health care provider. This is important. Contact a health care provider if:  You have a fever.  Your symptoms do not get better with treatment.  Your pain and discomfort get worse.  You have more frequent urges to urinate. Get help right away if:  You are not able to control your bladder. Summary  Overactive bladder refers to a condition in which a person has a sudden need to pass urine.  Several conditions may lead to an overactive bladder.  Treatment for overactive bladder depends on the cause and severity of your condition.  Follow your health care provider's instructions about lifestyle changes, doing Kegel exercises, keeping a  journal, and taking medicines. This information is not intended to replace advice given to you by your health care provider. Make sure you discuss any questions you have  with your health care provider. Document Released: 10/29/2008 Document Revised: 04/25/2018 Document Reviewed: 01/18/2017 Elsevier Patient Education  2020 [employer]  Holmium Laser Enucleation of the Prostate (HoLEP)  HoLEP is a treatment for men with benign prostatic hyperplasia (BPH). The laser surgery removed blockages of urine flow, and is done without any incisions on the body.     What is HoLEP?  HoLEP is a type of laser surgery used to treat obstruction (blockage) of urine flow as a result of benign prostatic hyperplasia (BPH). In men with BPH, the prostate gland is not cancerous, but has become enlarged. An enlarged prostate can result in a number of urinary tract symptoms such as weak urinary stream, difficulty in starting urination, inability to urinate, frequent urination, or getting up at night to urinate.  HoLEP was developed in the 1990's as a more effective and less expensive surgical option for BPH, compared to other surgical options such as laser vaporization(PVP/greenlight laser), transurethral resection of the prostate(TURP), and open simple prostatectomy.   What happens during a HoLEP?  HoLEP requires general anesthesia ("asleep" throughout the procedure).   An antibiotic is given to reduce the risk of infection  A surgical instrument called a resectoscope is inserted through the urethra (the tube that carries urine from the bladder). The resectoscope has a camera that allows the surgeon to view the internal structure of the prostate gland, and to see where the incisions are being made during surgery.  The laser is inserted into the resectoscope and is used to enucleate (free up) the enlarged prostate tissue from the capsule (outer shell) and then to seal up any blood vessels. The tissue that has been  removed is pushed back into the bladder.  A morcellator is placed through the resectoscope, and is used to suction out the prostate tissue that has been pushed into the bladder.  When the prostate tissue has been removed, the resectoscope is removed, and a foley catheter is placed to allow healing and drain the urine from the bladder.     What happens after a HoLEP?  More than 90% of patients go home the same day a few hours after surgery. Less than 10% will be admitted to the hospital overnight for observation to monitor the urine, or if they have other medical problems.  Fluid is flushed through the catheter for about 1 hour after surgery to clear any blood from the urine. It is normal to have some blood in the urine after surgery. The need for blood transfusion is extremely rare.  Eating and drinking are permitted after the procedure once the patient has fully awakened from anesthesia.  The catheter is usually removed 2-3 days after surgery- the patient will come to clinic to have the catheter removed and make sure they can urinate on their own.  It is very important to drink lots of fluids after surgery for one week to keep the bladder flushed.  At first, there may be some burning with urination, but this typically improved within a few hours to days. Most patients do not have a significant amount of pain, and narcotic pain medications are rarely needed.  Symptoms of urinary frequency, urgency, and even leakage are NORMAL for the first few weeks after surgery as the bladder adjusts after having to work hard against blockage from the prostate for many years. This will improve, but can sometimes take several months.  The use of pelvic floor exercises (Kegel exercises) can help improve problems with urinary incontinence.  After catheter removal, patients will be seen at 6 weeks and 6 months for symptom check  No heavy lifting for at least 2-3 weeks after surgery, however patients can  walk and do light activities the first day after surgery. Return to work time depends on occupation.    What are the advantages of HoLEP?  HoLEP has been studied in many different parts of the world and has been shown to be a safe and effective procedure. Although there are many types of BPH surgeries available, HoLEP offers a unique advantage in being able to remove a large amount of tissue without any incisions on the body, even in very large prostates, while decreasing the risk of bleeding and providing tissue for pathology (to look for cancer). This decreases the need for blood transfusions during surgery, minimizes hospital stay, and reduces the risk of needing repeat treatment.  What are the side effects of HoLEP?  Temporary burning and bleeding during urination. Some blood may be seen in the urine for weeks after surgery and is part of the healing process.  Urinary incontinence (inability to control urine flow) is expected in all patients immediately after surgery and they should wear pads for the first few days/weeks. This typically improves over the course of several weeks. Performing Kegel exercises can help decrease leakage from stress maneuvers such as coughing, sneezing, or lifting. The rate of long term leakage is very low. Patients may also have leakage with urgency and this may be treated with medication. The risk of urge incontinence can be dependent on several factors including age, prostate size, symptoms, and other medical problems.  Retrograde ejaculation or "backwards ejaculation." In 75% of cases, the patient will not see any fluid during ejaculation after surgery.  Erectile function is generally not significantly affected.   What are the risks of HoLEP?  Injury to the urethra or development of scar tissue at a later date  Injury to the capsule of the prostate (typically treated with longer catheterization).  Injury to the bladder or ureteral orifices (where the urine  from the kidney drains out)  Infection of the bladder, testes, or kidneys  Return of urinary obstruction at a later date requiring another operation (<2%)  Need for blood transfusion or re-operation due to bleeding  Failure to relieve all symptoms and/or need for prolonged catheterization after surgery  5-15% of patients are found to have previously undiagnosed prostate cancer in their specimen. Prostate cancer can be treated after HoLEP.  Standard risks of anesthesia including blood clots, heart attacks, etc  When should I call my doctor?  Fever over 101.3 degrees  Inability to urinate, or large blood clots in the urine

## 2018-07-30 NOTE — Progress Notes (Signed)
07/30/18 1:31 PM   [name] [birth year] [id number]  Referring provider: Gracelyn Nurse, MD [address] [place],  [place] [address]  CC: Elevated PSA, urinary symptoms  HPI: I saw Mr. Salkowski in urology clinic today in consultation from Dr. Laural Benes for elevated PSA and urinary symptoms of frequency, urgency, and nocturia.  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  He was previously followed by Dr. Achilles Dunk and another urologist at [facility].  He has been on long-term BPH treatment with Avodart and Flomax.  He has a long history of elevated PSAs, and reportedly has a negative biopsy around 10 years ago with Dr. Achilles Dunk.  These records are unavailable to me.  Most recent PSA was 7.3 in July 2019, corrected for finasteride to be 14.6.  This has slightly increased over the last few years.  He underwent prostate MRI at [facility] in December 2019 that showed a enlarged prostate with a volume of 94 g, multiple bladder stones, but no suspicious lesions for prostate cancer.  He has a family history of prostate cancer in both grandfathers.  His primary bothersome urinary symptoms are urinary frequency, urgency, occasional mild urge incontinence, and nocturia.  He does use a CPAP machine regularly for sleep apnea.  He says his stream is "okay", and he does feel empty after voiding.  He drinks a small amount of coffee in the morning, as well as green tea and tonic water in the afternoon.  He denies any history of gross hematuria or urinary tract infections.  There are no aggravating or alleviating factors.  Severity is moderate.  PVR in clinic today was 41 mL.   PMH: Past Medical History:  Diagnosis Date  . Atypical chest pain    a. 03/2012 St echo: nl EF, no wma's.  . CVA (cerebral vascular accident) (HCC)   . Elevated PSA   . Essential hypertension    . Hyperlipidemia   . MVP (mitral valve prolapse)   . Persistent atrial fibrillation    a. CHA2DS2VASc = 4-->chronic Xarelto.  . Psoriasis   . Reflux   . Vitamin B 12 deficiency     Surgical History: Past Surgical History:  Procedure Laterality Date  . VARICOSE VEIN SURGERY      Allergies:  Allergies  Allergen Reactions  . Other Anaphylaxis    Trees and grasses     Family History: Family History  Problem Relation Age of Onset  . Heart attack Father   . Hypertension Father   . Hyperlipidemia Father     Social History:  reports that he has never smoked. He has never used smokeless tobacco. He reports current alcohol use. He reports that he does not use drugs.  ROS: Please see flowsheet from today's date for complete review of systems.  Physical Exam: BP 140/85 (BP Location: Left Arm, Patient Position: Sitting)   Pulse 86   Ht 6\' 1"  (1.854 m)   Wt 186 lb (84.4 kg)   BMI 24.54 kg/m    Constitutional:  Alert and oriented, No acute distress. Cardiovascular: No clubbing, cyanosis, or edema. Respiratory: Normal respiratory effort, no increased work of breathing. GI: Abdomen is soft, nontender, nondistended, no abdominal masses GU: No CVA tenderness Lymph: No cervical or inguinal lymphadenopathy. Skin: No rashes, bruises or suspicious lesions. Neurologic: Grossly intact, no focal deficits, moving all 4 extremities. Psychiatric: Normal mood and affect.  Laboratory Data: PSA history reviewed in care everywhere   Pertinent Imaging: Unable to personally review prostate MRI from 12/2017 in [facility] system, but per report prostate volume 94g, multiple bladder stones, and no suspicious PIRAD lesions.  Assessment & Plan:   In summary, the patient is a relatively healthy 78 year old male who is on Xarelto for history of atrial fibrillation and mild distant stroke who presents with bothersome mixed urinary symptoms, and elevated PSA.  We had a very long conversation about the  AUA guidelines regarding PSA screening, and the reassuring finding of his negative prostate MRI in December 2019.  We discussed that there is an approximately 10 to 15% false negative rate for prostate MRIs, and this cannot completely rule out prostate cancer.  We discussed the risks and benefits of pursuing prostate biopsy at length, including a 1% risk of serious bleeding or infection.  He is amenable to discontinuing PSA screening at this time based on his age and recent negative prostate MRI.  He is interested in pursuing more aggressive treatment strategies for his bothersome urinary symptoms.  I recommended urodynamics prior to under going any surgical outlet procedures in the setting of his primarily overactive symptomatology with frequency and urgency.  Based on the bladder stones on his MRI, I suspect urodynamics will show a component of bladder outlet obstruction.  -Obtain urodynamics, follow-up to discuss results -Pursue HoLEP if bladder outlet obstruction on urodynamics.  If OAB, consider trial of Myrbetriq or oxybutynin -Would need to hold Xarelto if pursues HoLEP  A total of 60 minutes were spent face-to-face with the patient, greater than 50% was spent in patient education, counseling, and coordination of care regarding PSA screening, BPH and urinary symptoms, and overactive bladder.   Billey Co, [facility] [address], [place], [place] [address] [phone]

## 2018-08-08 ENCOUNTER — Other Ambulatory Visit: Payer: Self-pay | Admitting: Urology

## 2018-08-08 ENCOUNTER — Telehealth: Payer: Self-pay | Admitting: Radiology

## 2018-08-08 NOTE — Telephone Encounter (Signed)
Appointment made

## 2018-08-08 NOTE — Telephone Encounter (Signed)
LMOM on home and cell numbers to return call to schedule appointment.

## 2018-08-08 NOTE — Telephone Encounter (Signed)
-----   Message from Billey Co, MD sent at 08/08/2018 10:30 AM EDT ----- Regarding: follow up Please schedule follow up with me in the next few weeks to discuss urodynamic results and HOLEP.  Thanks Nickolas Madrid, MD 08/08/2018

## 2018-08-19 ENCOUNTER — Telehealth: Payer: Self-pay | Admitting: Cardiovascular Disease

## 2018-08-19 NOTE — Telephone Encounter (Signed)
Please review message from patient on the way he is taking the Diltiazem. If this is okay for him to take this way, please send in new Rx with instructions.

## 2018-08-19 NOTE — Telephone Encounter (Signed)
°*  STAT* If patient is at the pharmacy, call can be transferred to refill team.   1. Which medications need to be refilled? (please list name of each medication and dose if known)   Diltiazem 120 mg po q d - different dose than previous but he has been taking this way and it is working well  2. Which pharmacy/location (including street and city if local pharmacy) is medication to be sent to?   [facility]   3. Do they need a 30 day or 90 day supply? [facility]

## 2018-08-19 NOTE — Telephone Encounter (Signed)
Call to pt to verify the current way he is taking diltiazem.  No answer, LMTCB.

## 2018-08-20 MED ORDER — DILTIAZEM HCL ER COATED BEADS 120 MG PO CP24: 120.0000 mg | ORAL_CAPSULE | Freq: Every day | ORAL | 2 refills | Status: DC

## 2018-08-20 NOTE — Telephone Encounter (Signed)
From office visit with Dr Rockey Situ in 09/2017: Medication Instructions:   Try the diltiazem in the evening More water  If still dizzy, we could cut the dose of the dilatizem --------------------------------------------------------------- Called patient. Patient did decrease from 180 mg to 120 mg (old prescription). He's been taking the 120 mg for months now and has been fine. He would like to stay on it.  Because Dr Rockey Situ said we could cut down. Rx sent into pharmacy. Routing to Dr Rockey Situ to make him aware.

## 2018-08-20 NOTE — Telephone Encounter (Signed)
No answer. Left message to call back.   

## 2018-08-20 NOTE — Telephone Encounter (Signed)
Patient is returning your call. He requests a call back after 1:30

## 2018-09-03 ENCOUNTER — Encounter: Payer: Self-pay | Admitting: Urology

## 2018-09-03 ENCOUNTER — Ambulatory Visit (INDEPENDENT_AMBULATORY_CARE_PROVIDER_SITE_OTHER): Payer: Medicare PPO | Admitting: Urology

## 2018-09-03 ENCOUNTER — Other Ambulatory Visit: Payer: Self-pay

## 2018-09-03 VITALS — BP 113/68 | HR 98 | Ht 73.0 in | Wt 183.0 lb

## 2018-09-03 DIAGNOSIS — N210 Calculus in bladder: Secondary | ICD-10-CM | POA: Diagnosis not present

## 2018-09-03 DIAGNOSIS — N401 Enlarged prostate with lower urinary tract symptoms: Secondary | ICD-10-CM | POA: Diagnosis not present

## 2018-09-03 DIAGNOSIS — N138 Other obstructive and reflux uropathy: Secondary | ICD-10-CM

## 2018-09-03 NOTE — Patient Instructions (Signed)

## 2018-09-03 NOTE — Progress Notes (Signed)
   09/03/2018 2:26 PM   Collier Salina Elberta Fortis Jodelle Gross 12/07/1940 [id number]  Reason for visit: Follow up BPH, review UDS  HPI: I saw Mr. Newby back in urology clinic to review his urodynamics results.  To briefly summarize, he is a 78 year old male previously followed by Dr. Jacqlyn Larsen with a long history of urinary symptoms of frequency, urgency, nocturia, and weak stream.  He has an extensive history of elevated PSA with negative work-up including negative prostate biopsy 10 years ago, negative prostate MRI at [facility] in December 2019 that showed no suspicious lesions but prostate volume of 94 g and multiple bladder stones.  With his mixed symptoms, he elected to undergo urodynamics prior to pursuing any outlet procedure.  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  Max flow was 5 mL/s, average flow was 1.7 mL/s, and detrusor pressure at peak flow was 50 cm of water.  PVR was 22 male.  We discussed the risks and benefits of ongoing medical management vs outlet procedures like HoLEP or TURP.   We discussed the risks and benefits of HoLEP at length.  The procedure requires general anesthesia and takes 2 to 3 hours, and a holmium laser is used to enucleate the prostate and push this tissue into the bladder.  A morcellator is then used to remove this tissue, which is sent for pathology.  Majority of patients are able to discharge the same day with a catheter in place for 2 to 3 days, and will follow-up in clinic for a voiding trial.  Approximately 10% of patients will be admitted overnight to monitor the urine, or if they have multiple comorbidities.  We specifically discussed the risks of bleeding, infection, retrograde ejaculation, temporary urgency and urge incontinence, very low risk of long-term incontinence, and possible need for  additional procedures.  Finally, we discussed the risk of finding prostate cancer on HOLEP, and treatment options in the future including active surveillance or radiation.  Schedule HoLEP and cystolitholopaxy, will need to get clearance from Dr. Rockey Situ to hold Xarelto (indication history of distant mild TIA)  A total of 15 minutes were spent face-to-face with the patient, greater than 50% was spent in patient education, counseling, and coordination of care regarding BPH, treatment options, and HoLEP.   Billey Co, [facility] [address] [place], [place] [address] [phone]

## 2018-09-10 ENCOUNTER — Telehealth: Payer: Self-pay | Admitting: Cardiovascular Disease

## 2018-09-10 NOTE — Telephone Encounter (Signed)
Patient with diagnosis of Afib on Xarelto 20 mg daily for anticoagulation.    Procedure: HOLMIUM LASER ENUCLEATION OF PROSTATE Date of procedure: 09/27/2018 or 10/04/2018  CHADS2-VASc score of  5 (HTN, stroke (2010), AGE x2)  CrCl 93.2 (IBW) Platelet count 160  Request is to hold Xarelto 3-7 days prior; would prefer to limit time off anticoagulation considering hx of stroke. Will defer to MD for appropriate length of anticoag hold.

## 2018-09-10 NOTE — Telephone Encounter (Signed)
   [facility] Pre-operative Risk Assessment    Request for surgical clearance:  What type of surgery is being performed? HOLMIUM LASER ENUCLEATION OF PROSTATE 1. When is this surgery scheduled?  TBA 9/11 OR 9/18  2. What type of clearance is required (medical clearance vs. Pharmacy clearance to hold med vs. Both)? BOTH  3. Are there any medications that need to be held prior to surgery and how long? XARELTO 3-7 DAYS PRIOR  4. Practice name and name of physician performing surgery? DR Nickolas Madrid  5. What is your office phone number [phone]   7.   What is your office fax number [phone]  8.   Anesthesia type (None, local, MAC, general) ? NOT LISTED   Gary Dawson 09/10/2018, 4:09 PM  _________________________________________________________________   (provider comments below)

## 2018-09-10 NOTE — Telephone Encounter (Signed)
Please comment on holding xarelto. 

## 2018-09-11 NOTE — Telephone Encounter (Signed)
Given prior CVA, would try to minimize time off xarelto Would consider 2 days, would hope no more than 3 days In addition, would try to restart xarelto as soon as possible following procedure with placing him at high risk of bleed Otherwise acceptable risk

## 2018-09-12 NOTE — Telephone Encounter (Signed)
Left message for the patient to call back and speak to preop APP. Since date of surgery is either on 9/11 and 9/18, and his next visit with Dr. Rockey Situ is on 9/9, will see if we can clear the patient prior to the office visit. Also will need to verify with urologist office if Dr. Diamantina Providence is willing to do the surgery after holding Xarelto for 2 days, or is he only willing to do the surgery after holding a minimum of 3 days.

## 2018-09-16 ENCOUNTER — Other Ambulatory Visit: Payer: Self-pay | Admitting: Family Medicine

## 2018-09-16 ENCOUNTER — Other Ambulatory Visit: Payer: Medicare PPO

## 2018-09-16 DIAGNOSIS — Z01818 Encounter for other preprocedural examination: Secondary | ICD-10-CM

## 2018-09-16 DIAGNOSIS — N401 Enlarged prostate with lower urinary tract symptoms: Secondary | ICD-10-CM

## 2018-09-16 DIAGNOSIS — N138 Other obstructive and reflux uropathy: Secondary | ICD-10-CM

## 2018-09-17 ENCOUNTER — Other Ambulatory Visit: Payer: Self-pay

## 2018-09-17 ENCOUNTER — Other Ambulatory Visit: Payer: Medicare PPO

## 2018-09-17 DIAGNOSIS — N138 Other obstructive and reflux uropathy: Secondary | ICD-10-CM

## 2018-09-17 DIAGNOSIS — Z01818 Encounter for other preprocedural examination: Secondary | ICD-10-CM

## 2018-09-17 LAB — MICROSCOPIC EXAMINATION: Bacteria, UA: NONE SEEN

## 2018-09-17 LAB — URINALYSIS, COMPLETE
Bilirubin, UA: NEGATIVE
Glucose, UA: NEGATIVE
Ketones, UA: NEGATIVE
Leukocytes,UA: NEGATIVE
Nitrite, UA: NEGATIVE
Protein,UA: NEGATIVE
Specific Gravity, UA: 1.025 (ref 1.005–1.030)
Urobilinogen, Ur: 0.2 mg/dL (ref 0.2–1.0)
pH, UA: 5 (ref 5.0–7.5)

## 2018-09-17 NOTE — Telephone Encounter (Signed)
   Primary Cardiologist: No primary care provider on file.  Chart reviewed as part of pre-operative protocol coverage. Patient has a history of permanent atrial fibrillation on Cardizem and Xarelto. CHADS2-VASc score of  5 (HTN, stroke (2010), AGE x2). Patient has holmium laser enucleation of prostate scheduled for 09/27/2018 with Dr. Diamantina Providence. Received request to hold Xarelto for 3-7 days prior to surgery. Dr. Rockey Situ recommended holding Xarelto for no more than 3 days but preferably 2 days given history of stroke. Dr. Diamantina Providence stated he was more comfortable holding for 3 days so that is current plan. Dr. Rockey Situ felt like patient was otherwise at acceptable risk.   Given past medical history and time since last visit, based on ACC/AHA guidelines,  Gary Dawson would be at acceptable risk for the planned procedure without further cardiovascular testing.   Of note, I called patient to notify him and he did report continued dizziness since his visit in 09/2017 that he thinks has worsened some. He also mentions that he recently self-adjusted his Cardizem dose. Patient states symptoms associated with his enlarged prostate are very bothersome and he does not want to cancel/post-pone this procedure. I do not suspect that procedure will need to be post-poned for any additional cardiac work-up. However, patient has appointment with Dr. Rockey Situ scheduled for 09/25/2018 and final decision will be made at that time. However, will go ahead and send pre-op assessment to Dr. Doristine Counter office and our office can contact them if there are any changes after appointment with Dr. Rockey Situ. Will message Dr. Rockey Situ so he is aware. OK to hold anticoagulation as discussed above.  I will route this recommendation to the requesting party via Epic fax function and remove from pre-op pool.  Please call with questions.  Darreld Mclean, PA-C 09/17/2018, 10:50 AM

## 2018-09-17 NOTE — Telephone Encounter (Signed)
Dr. Diamantina Providence, We received a request to clear this patient laser enucleation of prostate with request to hold Xarelto for 3-7 days. Per Dr. Rockey Situ, Given prior CVA, would try to minimize time off xarelto. Would consider 2 days, would hope no more than 3 days In addition, would try to restart xarelto as soon as possible following procedure with placing him at high risk of bleed Otherwise acceptable risk  Would 2 days holding Xarelto be acceptable for you?  Thank you,  Daune Perch, AGNP-C [facility] 09/17/2018  9:44 AM

## 2018-09-17 NOTE — Telephone Encounter (Signed)
I would be much more comfortable with 3 days than 2, thanks  Nickolas Madrid, MD 09/17/2018

## 2018-09-18 ENCOUNTER — Other Ambulatory Visit: Payer: Self-pay | Admitting: Radiology

## 2018-09-18 DIAGNOSIS — N210 Calculus in bladder: Secondary | ICD-10-CM

## 2018-09-18 DIAGNOSIS — N138 Other obstructive and reflux uropathy: Secondary | ICD-10-CM

## 2018-09-18 DIAGNOSIS — N401 Enlarged prostate with lower urinary tract symptoms: Secondary | ICD-10-CM

## 2018-09-19 ENCOUNTER — Other Ambulatory Visit: Payer: Self-pay

## 2018-09-19 ENCOUNTER — Other Ambulatory Visit: Payer: Self-pay | Admitting: Radiology

## 2018-09-19 ENCOUNTER — Encounter
Admission: RE | Admit: 2018-09-19 | Discharge: 2018-09-19 | Disposition: A | Discharge status: Home / Self Care | Payer: Medicare PPO | Source: Ambulatory Visit | Attending: Urology | Admitting: Urology

## 2018-09-19 DIAGNOSIS — Z01812 Encounter for preprocedural laboratory examination: Secondary | ICD-10-CM | POA: Diagnosis present

## 2018-09-19 HISTORY — DX: Sleep apnea, unspecified: G47.30

## 2018-09-19 LAB — BASIC METABOLIC PANEL
Anion gap: 8 (ref 5–15)
BUN: 20 mg/dL (ref 8–23)
CO2: 28 mmol/L (ref 22–32)
Calcium: 8.9 mg/dL (ref 8.9–10.3)
Chloride: 106 mmol/L (ref 98–111)
Creatinine, Ser: 0.89 mg/dL (ref 0.61–1.24)
GFR calc Af Amer: >60 mL/min (ref >60)
GFR calc non Af Amer: >60 mL/min (ref >60)
Glucose, Bld: 105 mg/dL — ABNORMAL HIGH (ref 70–99)
Potassium: 3.8 mmol/L (ref 3.5–5.1)
Sodium: 142 mmol/L (ref 135–145)

## 2018-09-19 LAB — CBC
HCT: 39.1 % (ref 39.0–52.0)
Hemoglobin: 13.1 g/dL (ref 13.0–17.0)
MCH: 33.2 pg (ref 26.0–34.0)
MCHC: 33.5 g/dL (ref 30.0–36.0)
MCV: 99 fL (ref 80.0–100.0)
Platelets: 144 10*3/uL — ABNORMAL LOW (ref 150–400)
RBC: 3.95 MIL/uL — ABNORMAL LOW (ref 4.22–5.81)
RDW: 13.6 % (ref 11.5–15.5)
WBC: 5.1 10*3/uL (ref 4.0–10.5)
nRBC: 0 % (ref 0.0–0.2)

## 2018-09-19 LAB — CULTURE, URINE COMPREHENSIVE

## 2018-09-19 NOTE — Patient Instructions (Addendum)
Your procedure is scheduled on: Friday 09/27/18.  Report to DAY SURGERY DEPARTMENT LOCATED ON 2ND FLOOR MEDICAL MALL ENTRANCE. To find out your arrival time please call [phone] between 1PM - 3PM on Thursday 09/26/18.   Remember: Instructions that are not followed completely may result in serious medical risk, up to and including death, or upon the discretion of your surgeon and anesthesiologist your surgery may need to be rescheduled.      _X__ 1. Do not eat food after midnight the night before your procedure.                 No gum chewing or hard candies. You may drink clear liquids up to 2 hours                 before you are scheduled to arrive for your surgery- DO NOT drink clear                 liquids within 2 hours of the start of your surgery.                 Clear Liquids include:  water, apple juice without pulp, clear carbohydrate                 drink such as Clearfast or Gatorade, Black Coffee or Tea (Do not add                 Milk or creamer to coffee or tea).    __X__2.  On the morning of surgery brush your teeth with toothpaste and water, you may rinse your mouth with mouthwash if you wish.  Do not swallow any toothpaste or mouthwash.      _X__ 3.  No Alcohol for 24 hours before or after surgery.     __X__4.  Notify your doctor if there is any change in your medical condition      (cold, fever, infections).      Do not wear jewelry, make-up, hairpins, clips or nail polish. Do not wear lotions, powders, or perfumes.  Do not shave 48 hours prior to surgery. Men may shave face and neck. Do not bring valuables to the hospital.      [facility] is not responsible for any belongings or valuables.   Contacts, dentures/partials or body piercings may not be worn into surgery. Bring a case for your contacts, glasses or hearing aids, a denture cup will be supplied.     Patients discharged the day of surgery will not be allowed to drive home.     __X__ Take  these medicines the morning of surgery with A SIP OF WATER:     1. AVODART 0.5 MG capsule  2. diltiazem (DILTIAZEM CD) 120 MG 24 hr capsule  3. tamsulosin (FLOMAX) 0.4 MG CAPS capsule    __X__ Stop Blood Thinners: Xarelto. Dr. Rockey Situ cleared you to stop taking Xarelto 3 days prior to your procedure. Your last dose will be on Monday night 9/7.   __X__ Stop Anti-inflammatories 7 days before surgery such as Advil, Ibuprofen, Motrin, BC or Goodies Powder, Naprosyn, Naproxen, Aleve, Aspirin, Meloxicam. May take Tylenol if needed for pain or discomfort.    __X__ Please don't begin taking any new herbal supplements before your surgery.

## 2018-09-20 NOTE — Pre-Procedure Instructions (Signed)
CBC results sent to Dr. Diamantina Providence and Anesthesia for review.
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
## 2018-09-24 ENCOUNTER — Other Ambulatory Visit
Admission: RE | Admit: 2018-09-24 | Discharge: 2018-09-24 | Disposition: A | Discharge status: Home / Self Care | Payer: Medicare PPO | Source: Ambulatory Visit | Attending: Urology | Admitting: Urology

## 2018-09-24 ENCOUNTER — Other Ambulatory Visit: Payer: Self-pay

## 2018-09-24 DIAGNOSIS — Z20828 Contact with and (suspected) exposure to other viral communicable diseases: Secondary | ICD-10-CM | POA: Diagnosis present

## 2018-09-24 DIAGNOSIS — Z01812 Encounter for preprocedural laboratory examination: Secondary | ICD-10-CM | POA: Diagnosis not present

## 2018-09-24 LAB — SARS CORONAVIRUS 2 (TAT 6-24 HRS): SARS Coronavirus 2: NEGATIVE

## 2018-09-25 ENCOUNTER — Encounter: Payer: Self-pay | Admitting: Cardiovascular Disease

## 2018-09-25 ENCOUNTER — Ambulatory Visit (INDEPENDENT_AMBULATORY_CARE_PROVIDER_SITE_OTHER): Payer: Medicare PPO | Admitting: Cardiovascular Disease

## 2018-09-25 VITALS — BP 144/74 | HR 83 | Ht 73.5 in | Wt 188.0 lb

## 2018-09-25 DIAGNOSIS — I6310 Cerebral infarction due to embolism of unspecified precerebral artery: Secondary | ICD-10-CM

## 2018-09-25 DIAGNOSIS — R0602 Shortness of breath: Secondary | ICD-10-CM

## 2018-09-25 DIAGNOSIS — I4821 Permanent atrial fibrillation: Secondary | ICD-10-CM

## 2018-09-25 DIAGNOSIS — E782 Mixed hyperlipidemia: Secondary | ICD-10-CM | POA: Diagnosis not present

## 2018-09-25 DIAGNOSIS — I10 Essential (primary) hypertension: Secondary | ICD-10-CM | POA: Diagnosis not present

## 2018-09-25 NOTE — Patient Instructions (Signed)

## 2018-09-27 ENCOUNTER — Ambulatory Visit: Admission: RE | Admit: 2018-09-27 | Payer: Medicare PPO | Source: Ambulatory Visit | Admitting: Urology

## 2018-09-27 ENCOUNTER — Encounter: Admission: RE | Payer: Self-pay | Source: Ambulatory Visit

## 2018-09-27 ENCOUNTER — Emergency Department
Admission: EM | Admit: 2018-09-27 | Discharge: 2018-09-27 | Disposition: A | Discharge status: Home / Self Care | Payer: Medicare PPO | Attending: Emergency Medicine | Admitting: Emergency Medicine

## 2018-09-27 ENCOUNTER — Encounter: Payer: Self-pay | Admitting: Emergency Medicine

## 2018-09-27 ENCOUNTER — Other Ambulatory Visit: Payer: Self-pay

## 2018-09-27 DIAGNOSIS — I10 Essential (primary) hypertension: Secondary | ICD-10-CM | POA: Insufficient documentation

## 2018-09-27 DIAGNOSIS — R070 Pain in throat: Secondary | ICD-10-CM | POA: Diagnosis present

## 2018-09-27 DIAGNOSIS — Z79899 Other long term (current) drug therapy: Secondary | ICD-10-CM | POA: Insufficient documentation

## 2018-09-27 DIAGNOSIS — Z20828 Contact with and (suspected) exposure to other viral communicable diseases: Secondary | ICD-10-CM | POA: Insufficient documentation

## 2018-09-27 DIAGNOSIS — J029 Acute pharyngitis, unspecified: Secondary | ICD-10-CM

## 2018-09-27 LAB — GROUP A STREP BY PCR: Group A Strep by PCR: NOT DETECTED

## 2018-09-27 LAB — SARS CORONAVIRUS 2 BY RT PCR (HOSPITAL ORDER, PERFORMED IN ~~LOC~~ HOSPITAL LAB): SARS Coronavirus 2: NEGATIVE

## 2018-09-27 SURGERY — Surgical Case
Anesthesia: *Unknown

## 2018-09-27 SURGERY — ENUCLEATION, PROSTATE, USING LASER, WITH MORCELLATION
Anesthesia: Choice

## 2018-09-27 NOTE — ED Notes (Signed)
See triage note  States he developed sore throat last pm  Having some slight increase in pain with swallowing  No fever ,cough or body aches

## 2018-09-27 NOTE — ED Triage Notes (Signed)
Patient ambulatory to triage with steady gait, without difficulty or distress noted; mask in place; pt report sore throat this am, denies any accomp symptoms

## 2018-09-27 NOTE — ED Provider Notes (Signed)
[facility] Emergency Department Provider Note   ____________________________________________   First MD Initiated Contact with Patient 09/27/18 [id number]     (approximate)  I have reviewed the triage vital signs and the nursing notes.   HISTORY  Chief Complaint Sore Throat    HPI Gary Dawson is a 78 y.o. male patient presents with sore throat with a.m. awakening.  Patient denies any other symptoms.  Patient cancel his prostate surgery today secondary to concerns for COVID.  Patient was tested negative for COVID 3 days ago.  Patient state able to tolerate food and fluid.  No palliative measure for complaint.  Patient rates his pain as a 1/10.         Past Medical History:  Diagnosis Date  . Atypical chest pain    a. 03/2012 St echo: nl EF, no wma's.  . CVA (cerebral vascular accident) (HCC)   . Elevated PSA   . Essential hypertension   . Hyperlipidemia   . MVP (mitral valve prolapse)   . Persistent atrial fibrillation    a. CHA2DS2VASc = 4-->chronic Xarelto.  . Psoriasis   . Reflux   . Sleep apnea   . Vitamin B 12 deficiency     Patient Active Problem List   Diagnosis Date Noted  . Encounter for anticoagulation discussion and counseling 11/13/2014  . Essential hypertension 05/08/2013  . Hyperlipidemia 05/08/2013  . Shortness of breath 05/08/2013  . Permanent atrial fibrillation 05/06/2013  . CVA (cerebral vascular accident) (HCC) 05/06/2013    Past Surgical History:  Procedure Laterality Date  . COLONOSCOPY WITH PROPOFOL    . HERNIA REPAIR    . TONSILLECTOMY    . VARICOSE VEIN SURGERY      Prior to Admission medications   Medication Sig Start Date End Date Taking? Authorizing Provider  acetaminophen (TYLENOL) 325 MG tablet Take 650 mg by mouth every 6 (six) hours as needed for moderate pain.    [provider]  AVODART 0.5 MG capsule Take 0.5 mg by mouth daily.  03/21/13   [provider]  diltiazem  (DILTIAZEM CD) 120 MG 24 hr capsule Take 1 capsule (120 mg total) by mouth daily. 08/20/18   Gary Dawson, Gary J, MD  hydroxypropyl methylcellulose / hypromellose (ISOPTO TEARS / GONIOVISC) 2.5 % ophthalmic solution Place 1 drop into both eyes 2 (two) times daily as needed for dry eyes.    [provider]  tamsulosin (FLOMAX) 0.4 MG CAPS capsule Take 0.4 mg by mouth.    [provider]  tiZANidine (ZANAFLEX) 4 MG capsule Take 1 capsule (4 mg total) by mouth 3 (three) times daily. 01/31/18   Lutricia Feiloemer, Gary P, PA-C  vitamin B-12 (CYANOCOBALAMIN) 1000 MCG tablet Take 1,000 mcg by mouth daily.    [provider]  XARELTO 20 MG TABS tablet TAKE 1 TABLET EVERY DAY WITH SUPPER Patient taking differently: Take 20 mg by mouth daily with supper.  06/26/18   Gary Dawson, Gary J, MD    Allergies Other  Family History  Problem Relation Age of Onset  . Heart attack Father   . Hypertension Father   . Hyperlipidemia Father     Social History Social History   Tobacco Use  . Smoking status: Never Smoker  . Smokeless tobacco: Never Used  Substance Use Topics  . Alcohol use: Yes    Comment: social   . Drug use: No    Review of Systems Constitutional: No fever/chills Eyes: No visual changes. ENT: No sore  throat. Cardiovascular: Denies chest pain. Respiratory: Denies shortness of breath. Gastrointestinal: No abdominal pain.  No nausea, no vomiting.  No diarrhea.  No constipation. Genitourinary: Negative for dysuria.  BPH. Musculoskeletal: Negative for back pain. Skin: Negative for rash. Neurological: Negative for headaches, focal weakness or numbness. Endocrine:  Hyperlipidemia and hypertension.  ____________________________________________   PHYSICAL EXAM:  VITAL SIGNS: ED Triage Vitals  Enc Vitals Group     BP 09/27/18 0614 140/82     Pulse Rate 09/27/18 0614 86     Resp 09/27/18 0614 16     Temp 09/27/18 0614 98.1 F (36.7 C)     Temp Source 09/27/18 0614  Oral     SpO2 09/27/18 0614 97 %     Weight 09/27/18 0612 185 lb (83.9 kg)     Height 09/27/18 0612 6\' 1"  (1.854 m)     Head Circumference --      Peak Flow --      Pain Score 09/27/18 0612 1     Pain Loc --      Pain Edu? --      Excl. in [place]? --    Constitutional: Alert and oriented. Well appearing and in no acute distress. Mouth/Throat: Mucous membranes are moist.  Oropharynx erythematous. Neck: No stridor.  No cervical spine tenderness to palpation. Hematological/Lymphatic/Immunilogical: Right cervical lymphadenopathy. Cardiovascular: Normal rate, regular rhythm. Grossly normal heart sounds.  Good peripheral circulation. Respiratory: Normal respiratory effort.  No retractions. Lungs CTAB. Skin:  Skin is warm, dry and intact. No rash noted. Psychiatric: Mood and affect are normal. Speech and behavior are normal.  ____________________________________________   LABS (all labs ordered are listed, but only abnormal results are displayed)  Labs Reviewed  GROUP A STREP BY PCR  SARS CORONAVIRUS 2 (HOSPITAL ORDER, [place] [facility])   ____________________________________________  EKG   ____________________________________________  [place]  ED MD interpretation:    Official radiology report(s): No results found.  ____________________________________________   PROCEDURES  Procedure(s) performed (including Critical Care):  Procedures   ____________________________________________   INITIAL IMPRESSION / ASSESSMENT AND PLAN / ED COURSE  As part of my medical decision making, I reviewed the following data within the [place] [name] was evaluated in Emergency Department on 09/27/2018 for the symptoms described in the history of present illness. He was evaluated in the context of the global COVID-19 pandemic, which necessitated consideration that the patient might be at risk for infection with the  SARS-CoV-2 virus that causes COVID-19. Institutional protocols and algorithms that pertain to the evaluation of patients at risk for COVID-19 are in a state of rapid change based on information released by regulatory bodies including the CDC and federal and state organizations. These policies and algorithms were followed during the patient's care in the ED.    Patient reports today with complaint of sore throat with a.m. awakening.  Patient cancel his scheduled BPH biopsy today with concern that he might have COVID-19.  Advised patient he may review the results of his current 21 test later today through his phone app. ____________________________________________   FINAL CLINICAL IMPRESSION(S) / ED DIAGNOSES  Final diagnoses:  Sore throat     ED Discharge Orders    None       Note:  This document was prepared using Dragon voice recognition software and may include unintentional dictation errors.    Sable Feil, PA-C 09/27/18 [id number]    Jimmye Norman,  Cecille Amsterdam, MD 09/30/18 [phone]

## 2018-09-27 NOTE — Discharge Instructions (Signed)
You may check your COVID-19 test results through your "My Chart"

## 2018-09-30 ENCOUNTER — Ambulatory Visit: Payer: Medicare PPO

## 2018-10-02 ENCOUNTER — Telehealth: Payer: Self-pay | Admitting: Urology

## 2018-10-02 NOTE — Telephone Encounter (Signed)
Pt LMOM and wants to reschedule his surgery w/[facility]  [phone]

## 2018-10-07 ENCOUNTER — Other Ambulatory Visit: Payer: Self-pay | Admitting: Radiology

## 2018-10-07 DIAGNOSIS — N210 Calculus in bladder: Secondary | ICD-10-CM

## 2018-10-07 DIAGNOSIS — Z01818 Encounter for other preprocedural examination: Secondary | ICD-10-CM

## 2018-10-07 DIAGNOSIS — N138 Other obstructive and reflux uropathy: Secondary | ICD-10-CM

## 2018-10-22 ENCOUNTER — Other Ambulatory Visit: Payer: Self-pay

## 2018-10-22 ENCOUNTER — Other Ambulatory Visit: Payer: Medicare PPO

## 2018-10-22 DIAGNOSIS — N210 Calculus in bladder: Secondary | ICD-10-CM

## 2018-10-22 DIAGNOSIS — Z01818 Encounter for other preprocedural examination: Secondary | ICD-10-CM

## 2018-10-22 DIAGNOSIS — N138 Other obstructive and reflux uropathy: Secondary | ICD-10-CM

## 2018-10-23 LAB — URINALYSIS, COMPLETE
Bilirubin, UA: NEGATIVE
Glucose, UA: NEGATIVE
Ketones, UA: NEGATIVE
Leukocytes,UA: NEGATIVE
Nitrite, UA: NEGATIVE
Protein,UA: NEGATIVE
Specific Gravity, UA: 1.025 (ref 1.005–1.030)
Urobilinogen, Ur: 0.2 mg/dL (ref 0.2–1.0)
pH, UA: 5.5 (ref 5.0–7.5)

## 2018-10-23 LAB — MICROSCOPIC EXAMINATION
Bacteria, UA: NONE SEEN
Epithelial Cells (non renal): NONE SEEN /hpf (ref 0–10)

## 2018-10-25 ENCOUNTER — Other Ambulatory Visit: Payer: Self-pay

## 2018-10-25 ENCOUNTER — Encounter
Admission: RE | Admit: 2018-10-25 | Discharge: 2018-10-25 | Disposition: A | Discharge status: Home / Self Care | Payer: Medicare PPO | Source: Ambulatory Visit | Attending: Urology | Admitting: Urology

## 2018-10-25 HISTORY — DX: Cardiac arrhythmia, unspecified: I49.9

## 2018-10-25 HISTORY — DX: Depression, unspecified: F32.A

## 2018-10-25 NOTE — Pre-Procedure Instructions (Signed)
Requested orders from office.

## 2018-10-25 NOTE — Patient Instructions (Signed)
Your procedure is scheduled on: 11/03/2018 Fri Report to Same Day Surgery 2nd floor medical mall [facility] Entrance-take elevator on left to 2nd floor.  Check in with surgery information desk.) To find out your arrival time please call [phone] between 1PM - 3PM on 11/02/2018 Thurs  Remember: Instructions that are not followed completely may result in serious medical risk, up to and including death, or upon the discretion of your surgeon and anesthesiologist your surgery may need to be rescheduled.    _x___ 1. Do not eat food after midnight the night before your procedure. You may drink clear liquids up to 2 hours before you are scheduled to arrive at the hospital for your procedure.  Do not drink clear liquids within 2 hours of your scheduled arrival to the hospital.  Clear liquids include  --Water or Apple juice without pulp  --Clear carbohydrate beverage such as ClearFast or Gatorade  --Black Coffee or Clear Tea (No milk, no creamers, do not add anything to                  the coffee or Tea Type 1 and type 2 diabetics should only drink water.   ____Ensure clear carbohydrate drink on the way to the hospital for bariatric patients  ____Ensure clear carbohydrate drink 3 hours before surgery.   No gum chewing or hard candies.     __x__ 2. No Alcohol for 24 hours before or after surgery.   __x__3. No Smoking or e-cigarettes for 24 prior to surgery.  Do not use any chewable tobacco products for at least 6 hour prior to surgery   ____  4. Bring all medications with you on the day of surgery if instructed.    __x__ 5. Notify your doctor if there is any change in your medical condition     (cold, fever, infections).    x___6. On the morning of surgery brush your teeth with toothpaste and water.  You may rinse your mouth with mouth wash if you wish.  Do not swallow any toothpaste or mouthwash.   Do not wear jewelry, make-up, hairpins, clips or nail polish.  Do not wear lotions,  powders, or perfumes. You may wear deodorant.  Do not shave 48 hours prior to surgery. Men may shave face and neck.  Do not bring valuables to the hospital.    [facility] is not responsible for any belongings or valuables.               Contacts, dentures or bridgework may not be worn into surgery.  Leave your suitcase in the car. After surgery it may be brought to your room.  For patients admitted to the hospital, discharge time is determined by your                       treatment team.  _  Patients discharged the day of surgery will not be allowed to drive home.  You will need someone to drive you home and stay with you the night of your procedure.    Please read over the following fact sheets that you were given:   [facility] Preparing for Surgery and or MRSA Information   _x___ Take anti-hypertensive listed below, cardiac, seizure, asthma,     anti-reflux and psychiatric medicines. These include:  1. diltiazem (DILTIAZEM CD) 120 MG 24 hr capsule  2.tamsulosin (FLOMAX) 0.4 MG CAPS capsule  3.  4.  5.  6.  ____Fleets enema  or Magnesium Citrate as directed.   _x___ Use CHG Soap or sage wipes as directed on instruction sheet   ____ Use inhalers on the day of surgery and bring to hospital day of surgery  ____ Stop Metformin and Janumet 2 days prior to surgery.    ____ Take 1/2 of usual insulin dose the night before surgery and none on the morning     surgery.   _x___ Follow recommendations from Cardiologist, Pulmonologist or PCP regarding          stopping Aspirin, Coumadin, Plavix ,Eliquis, Effient, or Pradaxa, and Pletal.  X____Stop Anti-inflammatories such as Advil, Aleve, Ibuprofen, Motrin, Naproxen, Naprosyn, Goodies powders or aspirin products. OK to take Tylenol and                          Celebrex.   _x___ Stop supplements until after surgery.  But may continue Vitamin D, Vitamin B,       and multivitamin.   ____ Bring C-Pap to the hospital.

## 2018-10-26 LAB — CULTURE, URINE COMPREHENSIVE

## 2018-10-28 ENCOUNTER — Other Ambulatory Visit: Payer: Self-pay | Admitting: Radiology

## 2018-10-28 DIAGNOSIS — N138 Other obstructive and reflux uropathy: Secondary | ICD-10-CM

## 2018-10-28 DIAGNOSIS — N210 Calculus in bladder: Secondary | ICD-10-CM

## 2018-10-29 ENCOUNTER — Other Ambulatory Visit: Payer: Self-pay

## 2018-10-29 ENCOUNTER — Other Ambulatory Visit
Admission: RE | Admit: 2018-10-29 | Discharge: 2018-10-29 | Disposition: A | Discharge status: Home / Self Care | Payer: Medicare PPO | Source: Ambulatory Visit | Attending: Urology | Admitting: Urology

## 2018-10-29 DIAGNOSIS — Z01812 Encounter for preprocedural laboratory examination: Secondary | ICD-10-CM | POA: Insufficient documentation

## 2018-10-29 DIAGNOSIS — Z20828 Contact with and (suspected) exposure to other viral communicable diseases: Secondary | ICD-10-CM | POA: Diagnosis not present

## 2018-10-29 LAB — SARS CORONAVIRUS 2 (TAT 6-24 HRS): SARS Coronavirus 2: NEGATIVE

## 2018-11-01 ENCOUNTER — Encounter: Payer: Self-pay | Admitting: *Deleted

## 2018-11-01 ENCOUNTER — Ambulatory Visit: Payer: Medicare PPO | Admitting: Anesthesiology

## 2018-11-01 ENCOUNTER — Other Ambulatory Visit: Payer: Self-pay

## 2018-11-01 ENCOUNTER — Ambulatory Visit
Admission: RE | Admit: 2018-11-01 | Discharge: 2018-11-01 | Disposition: A | Discharge status: Home / Self Care | Payer: Medicare PPO | Attending: Urology | Admitting: Urology

## 2018-11-01 ENCOUNTER — Encounter
Admission: RE | Disposition: A | Discharge status: Home / Self Care | Payer: Self-pay | Source: Home / Self Care | Attending: Urology

## 2018-11-01 DIAGNOSIS — N401 Enlarged prostate with lower urinary tract symptoms: Secondary | ICD-10-CM | POA: Diagnosis not present

## 2018-11-01 DIAGNOSIS — Z8673 Personal history of transient ischemic attack (TIA), and cerebral infarction without residual deficits: Secondary | ICD-10-CM | POA: Diagnosis not present

## 2018-11-01 DIAGNOSIS — Z7901 Long term (current) use of anticoagulants: Secondary | ICD-10-CM | POA: Insufficient documentation

## 2018-11-01 DIAGNOSIS — Z91048 Other nonmedicinal substance allergy status: Secondary | ICD-10-CM | POA: Insufficient documentation

## 2018-11-01 DIAGNOSIS — I341 Nonrheumatic mitral (valve) prolapse: Secondary | ICD-10-CM | POA: Diagnosis not present

## 2018-11-01 DIAGNOSIS — I10 Essential (primary) hypertension: Secondary | ICD-10-CM | POA: Diagnosis not present

## 2018-11-01 DIAGNOSIS — N138 Other obstructive and reflux uropathy: Secondary | ICD-10-CM | POA: Diagnosis not present

## 2018-11-01 DIAGNOSIS — N320 Bladder-neck obstruction: Secondary | ICD-10-CM

## 2018-11-01 DIAGNOSIS — I4819 Other persistent atrial fibrillation: Secondary | ICD-10-CM | POA: Diagnosis not present

## 2018-11-01 DIAGNOSIS — G4730 Sleep apnea, unspecified: Secondary | ICD-10-CM | POA: Insufficient documentation

## 2018-11-01 DIAGNOSIS — N210 Calculus in bladder: Secondary | ICD-10-CM | POA: Insufficient documentation

## 2018-11-01 HISTORY — PX: HOLEP-LASER ENUCLEATION OF THE PROSTATE WITH MORCELLATION: SHX6641

## 2018-11-01 HISTORY — PX: CYSTOSCOPY WITH LITHOLAPAXY: SHX1425

## 2018-11-01 SURGERY — ENUCLEATION, PROSTATE, USING LASER, WITH MORCELLATION
Anesthesia: General | Site: Prostate

## 2018-11-01 MED ORDER — LACTATED RINGERS IV SOLN
INTRAVENOUS | Status: DC | PRN
  Administered 2018-11-01: 08:00:00 via INTRAVENOUS

## 2018-11-01 MED ORDER — FENTANYL CITRATE (PF) 100 MCG/2ML IJ SOLN: 25.0000 ug | INTRAMUSCULAR | Status: DC | PRN

## 2018-11-01 MED ORDER — CEFAZOLIN SODIUM-DEXTROSE 2-4 GM/100ML-% IV SOLN: 2.0000 g | INTRAVENOUS | Status: DC

## 2018-11-01 MED ORDER — HYDROCODONE-ACETAMINOPHEN 5-325 MG PO TABS: 1.0000 | ORAL_TABLET | ORAL | 0 refills | Status: AC | PRN

## 2018-11-01 MED ORDER — ONDANSETRON HCL 4 MG/2ML IJ SOLN
INTRAMUSCULAR | Status: DC | PRN
  Administered 2018-11-01: 4 mg via INTRAVENOUS

## 2018-11-01 MED ORDER — PHENYLEPHRINE HCL (PRESSORS) 10 MG/ML IV SOLN
INTRAVENOUS | Status: DC | PRN
  Administered 2018-11-01: 100 ug via INTRAVENOUS
  Administered 2018-11-01: 200 ug via INTRAVENOUS
  Administered 2018-11-01: 100 ug via INTRAVENOUS
  Administered 2018-11-01 (×2): 200 ug via INTRAVENOUS

## 2018-11-01 MED ORDER — DEXAMETHASONE SODIUM PHOSPHATE 10 MG/ML IJ SOLN
INTRAMUSCULAR | Status: DC | PRN
  Administered 2018-11-01: 10 mg via INTRAVENOUS

## 2018-11-01 MED ORDER — FENTANYL CITRATE (PF) 100 MCG/2ML IJ SOLN
INTRAMUSCULAR | Status: DC | PRN
  Administered 2018-11-01: 50 ug via INTRAVENOUS
  Administered 2018-11-01 (×2): 25 ug via INTRAVENOUS

## 2018-11-01 MED ORDER — PROPOFOL 10 MG/ML IV BOLUS
INTRAVENOUS | Status: DC | PRN
  Administered 2018-11-01: 150 mg via INTRAVENOUS
  Administered 2018-11-01: 30 mg via INTRAVENOUS

## 2018-11-01 MED ORDER — SODIUM CHLORIDE 0.9 % IV SOLN
INTRAVENOUS | Status: DC | PRN
  Administered 2018-11-01: 09:00:00 50 ug/min via INTRAVENOUS

## 2018-11-01 MED ORDER — FAMOTIDINE 20 MG PO TABS
20.0000 mg | ORAL_TABLET | Freq: Once | ORAL | Status: AC
  Administered 2018-11-01: 07:00:00 20 mg via ORAL

## 2018-11-01 MED ORDER — LIDOCAINE HCL (CARDIAC) PF 100 MG/5ML IV SOSY
PREFILLED_SYRINGE | INTRAVENOUS | Status: DC | PRN
  Administered 2018-11-01: 100 mg via INTRAVENOUS

## 2018-11-01 MED ORDER — SUGAMMADEX SODIUM 200 MG/2ML IV SOLN
INTRAVENOUS | Status: DC | PRN
  Administered 2018-11-01: 200 mg via INTRAVENOUS
  Administered 2018-11-01: 100 mg via INTRAVENOUS

## 2018-11-01 MED ORDER — ROCURONIUM BROMIDE 100 MG/10ML IV SOLN
INTRAVENOUS | Status: DC | PRN
  Administered 2018-11-01: 20 mg via INTRAVENOUS
  Administered 2018-11-01: 50 mg via INTRAVENOUS

## 2018-11-01 MED ORDER — LACTATED RINGERS IV SOLN
INTRAVENOUS | Status: DC
  Administered 2018-11-01: 07:00:00 via INTRAVENOUS

## 2018-11-01 MED ORDER — BELLADONNA ALKALOIDS-OPIUM 16.2-60 MG RE SUPP
RECTAL | Status: DC | PRN
  Administered 2018-11-01: 1 via RECTAL

## 2018-11-01 MED ORDER — FAMOTIDINE 20 MG PO TABS
ORAL_TABLET | ORAL | Status: AC
  Administered 2018-11-01: 20 mg via ORAL
  Filled 2018-11-01: qty 1

## 2018-11-01 MED ORDER — BELLADONNA ALKALOIDS-OPIUM 16.2-60 MG RE SUPP
RECTAL | Status: AC
  Filled 2018-11-01: qty 1

## 2018-11-01 MED ORDER — CEFAZOLIN SODIUM-DEXTROSE 2-4 GM/100ML-% IV SOLN
INTRAVENOUS | Status: AC
  Filled 2018-11-01: qty 100

## 2018-11-01 MED ORDER — ONDANSETRON HCL 4 MG/2ML IJ SOLN: 4.0000 mg | Freq: Once | INTRAMUSCULAR | Status: DC | PRN

## 2018-11-01 SURGICAL SUPPLY — 38 items
ADAPTER IRRIG TUBE 2 SPIKE SOL (ADAPTER) ×6 IMPLANT
BAG DRAIN CYSTO-URO LG1000N (MISCELLANEOUS) ×3 IMPLANT
BAG URO DRAIN 4000ML (MISCELLANEOUS) ×3 IMPLANT
CATH FOLEY 3WAY 30CC 24FR (CATHETERS)
CATH URETL 5X70 OPEN END (CATHETERS) ×3 IMPLANT
CATH URTH STD 24FR FL 3W 2 (CATHETERS) IMPLANT
CONTAINER COLLECT MORCELLATR (MISCELLANEOUS) ×2 IMPLANT
DRAPE 3/4 80X56 (DRAPES) ×3 IMPLANT
DRAPE UTILITY 15X26 TOWEL STRL (DRAPES) IMPLANT
ELECT BIVAP BIPO 22/24 DONUT (ELECTROSURGICAL)
ELECTRD BIVAP BIPO 22/24 DONUT (ELECTROSURGICAL) IMPLANT
FILTER OVERFLOW MORCELLATOR (FILTER) ×2 IMPLANT
GLOVE BIOGEL PI IND STRL 7.5 (GLOVE) ×2 IMPLANT
GLOVE BIOGEL PI INDICATOR 7.5 (GLOVE) ×1
GOWN STRL REUS W/ TWL LRG LVL3 (GOWN DISPOSABLE) ×2 IMPLANT
GOWN STRL REUS W/ TWL XL LVL3 (GOWN DISPOSABLE) ×2 IMPLANT
GOWN STRL REUS W/TWL LRG LVL3 (GOWN DISPOSABLE) ×1
GOWN STRL REUS W/TWL XL LVL3 (GOWN DISPOSABLE) ×1
GUIDEWIRE STR DUAL SENSOR (WIRE) IMPLANT
GUIDEWIRE SUPER STIFF .035X180 (WIRE) ×3 IMPLANT
HOLDER FOLEY CATH W/STRAP (MISCELLANEOUS) IMPLANT
KIT PROBE TRILOGY 3.9X350 (MISCELLANEOUS) IMPLANT
KIT TURNOVER CYSTO (KITS) ×3 IMPLANT
LASER FIBER 550M SMARTSCOPE (Laser) ×3 IMPLANT
MORCELLATOR COLLECT CONTAINER (MISCELLANEOUS) ×3
MORCELLATOR OVERFLOW FILTER (FILTER) ×3
MORCELLATOR ROTATION 4.75 335 (MISCELLANEOUS) ×3 IMPLANT
PACK CYSTO AR (MISCELLANEOUS) ×3 IMPLANT
SET CYSTO W/LG BORE CLAMP LF (SET/KITS/TRAYS/PACK) ×3 IMPLANT
SET IRRIG Y TYPE TUR BLADDER L (SET/KITS/TRAYS/PACK) ×3 IMPLANT
SLEEVE PROTECTION STRL DISP (MISCELLANEOUS) ×6 IMPLANT
SOL .9 NS 3000ML IRR  AL (IV SOLUTION) ×10
SOL .9 NS 3000ML IRR UROMATIC (IV SOLUTION) ×20 IMPLANT
SURGILUBE 2OZ TUBE FLIPTOP (MISCELLANEOUS) ×3 IMPLANT
SYRINGE IRR TOOMEY STRL 70CC (SYRINGE) ×3 IMPLANT
TUBE PUMP MORCELLATOR PIRANHA (TUBING) ×3 IMPLANT
WATER STERILE IRR 1000ML POUR (IV SOLUTION) ×3 IMPLANT
WATER STERILE IRR 3000ML UROMA (IV SOLUTION) ×3 IMPLANT

## 2018-11-01 NOTE — H&P (Signed)
11/01/18 7:21 AM   Collier Salina Elberta Fortis Jodelle Gross March 02, 1940 [id number]  CC: BPH/LUTS  HPI: To briefly summarize, he is a 78 year old male previously followed by Dr. Jacqlyn Larsen with a long history of urinary symptoms of frequency, urgency, nocturia, and weak stream.  He has an extensive history of elevated PSA with negative work-up including negative prostate biopsy 10 years ago, negative prostate MRI at [facility] in December 2019 that showed no suspicious lesions but prostate volume of 94 g and multiple bladder stones.  With his mixed symptoms, he elected to undergo urodynamics prior to pursuing any outlet procedure.  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  Max flow was 5 mL/s, average flow was 1.7 mL/s, and detrusor pressure at peak flow was 50 cm of water.  PVR was 134mL.  He elects for HoLEP and cystolitholopaxy.   PMH: Past Medical History:  Diagnosis Date  . Atypical chest pain    a. 03/2012 St echo: nl EF, no wma's.  . CVA (cerebral vascular accident) ([place])   . Depression   . Dysrhythmia   . Elevated PSA   . Essential hypertension   . Hyperlipidemia   . MVP (mitral valve prolapse)   . Persistent atrial fibrillation (HCC)    a. CHA2DS2VASc = 4-->chronic Xarelto.  . Psoriasis   . Reflux   . Sleep apnea   . Vitamin B 12 deficiency     Surgical History: Past Surgical History:  Procedure Laterality Date  . COLONOSCOPY WITH PROPOFOL    . HERNIA REPAIR    . TONSILLECTOMY    . VARICOSE VEIN SURGERY      Allergies:  Allergies  Allergen Reactions  . Other Other (See Comments)    Trees and grasses     Family History: Family History  Problem Relation Age of Onset  . Heart attack Father   . Hypertension Father   . Hyperlipidemia Father     Social History:  reports that he has never smoked. He has  never used smokeless tobacco. He reports current alcohol use of about 1.0 standard drinks of alcohol per week. He reports that he does not use drugs.  ROS: Please see flowsheet from today's date for complete review of systems.  Physical Exam: BP (!) 144/92   Pulse (!) 102   Temp (!) 97.2 F (36.2 C) (Oral)   Resp 16   Ht 6' 1.5" (1.867 m)   Wt 83.9 kg   SpO2 100%   BMI 24.08 kg/m    Constitutional:  Alert and oriented, No acute distress. Cardiovascular: Irregularly irregular Respiratory: CTA bilaterally GI: Abdomen is soft, nontender, nondistended, no abdominal masses Lymph: No cervical or inguinal lymphadenopathy. Skin: No rashes, bruises or suspicious lesions. Neurologic: Grossly intact, no focal deficits, moving all 4 extremities. Psychiatric: Normal mood and affect.  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  We discussed the risks and benefits of HOLEP at length.  The procedure requires general anesthesia and takes 2 to 3 hours, and a holmium laser is used to enucleate the prostate and push this tissue into the bladder.  A morcellator is then used to remove this tissue, which is sent for pathology.  Majority of  patients are able to discharge the same day with a catheter in place for 2 to 3 days, and will follow-up in clinic for a voiding trial.  Approximately 10% of patients will be admitted overnight to monitor the urine, or if they have multiple comorbidities.  We specifically discussed the risks of bleeding, infection, retrograde ejaculation, temporary urgency and urge incontinence, very low risk of long-term incontinence, and possible need for additional procedures.  HoLEP and cystolitholopaxy  [name], MD  [facility] [address], [address] [place], [place] [address] [phone]

## 2018-11-01 NOTE — Anesthesia Postprocedure Evaluation (Signed)
Anesthesia Post Note  Patient: Gary Dawson  Procedure(s) Performed: HOLEP-LASER ENUCLEATION OF THE PROSTATE WITH MORCELLATION (N/A Prostate) CYSTOSCOPY WITH LITHOLAPAXY (N/A Bladder)  Patient location during evaluation: PACU Anesthesia Type: General Level of consciousness: awake and alert Pain management: pain level controlled Vital Signs Assessment: post-procedure vital signs reviewed and stable Respiratory status: spontaneous breathing and respiratory function stable Cardiovascular status: stable Anesthetic complications: no     Last Vitals:  Vitals:   11/01/18 1042 11/01/18 1057  BP: (!) 153/97 111/81  Pulse: (!) 110 78  Resp: 14 (!) 22  Temp: (!) 35.9 C   SpO2: 100% 99%    Last Pain:  Vitals:   11/01/18 1057  TempSrc:   PainSc: Asleep                 KEPHART,WILLIAM K

## 2018-11-01 NOTE — Anesthesia Procedure Notes (Addendum)
Procedure Name: Intubation Date/Time: 11/01/2018 8:17 AM Performed by: Justus Memory, CRNA Pre-anesthesia Checklist: Patient identified, Patient being monitored, Timeout performed, Emergency Drugs available and Suction available Patient Re-evaluated:Patient Re-evaluated prior to induction Oxygen Delivery Method: Circle system utilized Preoxygenation: Pre-oxygenation with 100% oxygen Induction Type: IV induction Ventilation: Mask ventilation with difficulty, Two handed mask ventilation required and Oral airway inserted - appropriate to patient size Laryngoscope Size: Mac, 4 and McGraph Grade View: Grade III Tube type: Oral Tube size: 7.0 mm Number of attempts: 1 Airway Equipment and Method: Stylet,  Patient positioned with wedge pillow,  Rigid stylet,  Bougie stylet and Video-laryngoscopy Placement Confirmation: ETT inserted through vocal cords under direct vision,  positive ETCO2 and breath sounds checked- equal and bilateral Secured at: 21 cm Tube secured with: Tape Dental Injury: Teeth and Oropharynx as per pre-operative assessment  Difficulty Due To: Difficulty was unanticipated and Difficult Airway- due to anterior larynx Future Recommendations: Recommend- induction with short-acting agent, and alternative techniques readily available Comments: Generous TD > 4cm, however DL revealed deep and anterior larynx, attempted to pass ETT over bougie, no ETCO2, second DL with the McGrath and obtained a grade 1 view and passed ETT with positive ETCO2, BBS, O2 sat > 95% throughout

## 2018-11-01 NOTE — Transfer of Care (Signed)
Immediate Anesthesia Transfer of Care Note  Patient: Gary Dawson  Procedure(s) Performed: HOLEP-LASER ENUCLEATION OF THE PROSTATE WITH MORCELLATION (N/A Prostate) CYSTOSCOPY WITH LITHOLAPAXY (N/A Bladder)  Patient Location: PACU  Anesthesia Type:General  Level of Consciousness: sedated  Airway & Oxygen Therapy: Patient Spontanous Breathing and Patient connected to face mask oxygen  Post-op Assessment: Report given to RN and Post -op Vital signs reviewed and stable  Post vital signs: Reviewed and stable  Last Vitals:  Vitals Value Taken Time  BP 153/97 11/01/18 1042  Temp    Pulse 46 11/01/18 1046  Resp 15 11/01/18 1046  SpO2 100 % 11/01/18 1046  Vitals shown include unvalidated device data.  Last Pain:  Vitals:   11/01/18 1042  TempSrc:   PainSc: (P) Asleep         Complications: No apparent anesthesia complications

## 2018-11-01 NOTE — Discharge Instructions (Signed)

## 2018-11-01 NOTE — Op Note (Addendum)
Date of procedure: 11/01/18  Preoperative diagnosis:  1. BPH with obstruction 2. Bladder stones > 2 cm  Postoperative diagnosis:  1. Same  Procedure: 1. HoLEP 2. Cystolitholopaxy >2cm  Surgeon: Nickolas Madrid, MD  Anesthesia: General  Complications: None  Intraoperative findings:  1.  Very large prostate with obstructing lateral lobes, high bladder neck 2.  Numerous 2 to 3 cm bladder stones, all laser dusted and fragments evacuated 3.  Uncomplicated HOLEP 4.  Bilateral ureteral orifices and Veru intact at conclusion of case 5.  Foley placed over wire with high bladder neck  EBL: 10 mL  Specimens: Prostate tissue for permanent  Enucleation time: 59 minutes  Morcellation time: 9 minutes  Intra-op weight: 94 g  Drains: 24 French three-way Foley, 70 cc in balloon  Indication: Gary Dawson is a 78 y.o. patient with BPH and enlarged prostate to 94 g, as well as multiple bladder stones.  He had urodynamic confirmed bladder outlet obstruction with high voiding pressures.  After reviewing the management options for treatment, they elected to proceed with the above surgical procedure(s). We have discussed the potential benefits and risks of the procedure, side effects of the proposed treatment, the likelihood of the patient achieving the goals of the procedure, and any potential problems that might occur during the procedure or recuperation.  We specifically discussed the risks of bleeding, infection, hematuria and clot retention, need for additional procedures, possible overnight hospital stay, temporary urgency and incontinence, rare long-term incontinence, and retrograde ejaculation.  Informed consent has been obtained.   Description of procedure:  The patient was taken to the operating room and general anesthesia was induced.  The patient was placed in the dorsal lithotomy position, prepped and draped in the usual sterile fashion, and preoperative antibiotics were  administered.  SCDs were placed for DVT prophylaxis.  A preoperative time-out was performed.   The 31 French continuous flow resectoscope was inserted into the urethra using the visual obturator  The prostate was very large with a high bladder neck and obstructing lateral lobes. The bladder was thoroughly inspected and notable for numerous 2 to 3 cm bladder stones, but no mucosal lesions.  The ureteral orifices were located in orthotopic position.    The 500 m laser fiber on settings of 1.5 J and 15 Hz was used to fragment the multiple bladder stones to small pieces which were then evacuated from the bladder.  All stone fragments were cleared from the bladder.  The laser was then set to 2 J and 50 Hz and was used to make an incision at the 6 o'clock position to the level of the capsule from the bladder neck to the verumontanum.  The lateral lobes were then incised circumferentially until they were disconnected from the surrounding tissue.  The capsule was examined and laser was used for meticulous hemostasis.  The 62 French resectoscope was then switched out for the 24 French nephroscope and the lobes were morcellated and the tissue sent to pathology.  A 24 French three-way catheter did not pass easily into the bladder secondary to the high bladder neck, and a Super Stiff wire was passed into the bladder and a catheter passed over the wire with return of light pink fluid, and CBI was initiated.  70 cc were placed in the balloon.  Urine was light pink to clear.  A belladonna suppository was placed.  The patient tolerated the procedure well without any immediate complications and was extubated and transferred to the recovery room  in stable condition.  Urine was clear on fast CBI.  Disposition: Stable to PACU  Plan: Wean CBI in PACU, anticipate discharge home today with void trial in clinic in 2-3 days Okay to resume Xarelto on Monday if urine is clear to light pink  Legrand Rams, MD 11/01/2018

## 2018-11-01 NOTE — Progress Notes (Signed)
Ch visited pt in pre-op while he awaited his procedure. Pt had a positive affect upon ch arrival. Pt shared that he lives alone and is retired but stays active by working at the [employer] part-time. Ch provided words of comfort and social support which the pt appreciated while waiting for the care team to prep him for his procedure.  No further needs at this time.    11/01/18 0800  Clinical Encounter Type  Visited With Patient  Visit Type Pre-op;Social support  Spiritual Encounters  Spiritual Needs Emotional;Grief support  Stress Factors  Patient Stress Factors Health changes  Family Stress Factors None identified

## 2018-11-01 NOTE — Anesthesia Preprocedure Evaluation (Signed)
Anesthesia Evaluation  Patient identified by MRN, date of birth, ID band Patient awake    Reviewed: Allergy & Precautions, NPO status , Patient's Chart, lab work & pertinent test results  History of Anesthesia Complications Negative for: history of anesthetic complications  Airway Mallampati: II       Dental   Pulmonary sleep apnea (CPAP most of the time) and Continuous Positive Airway Pressure Ventilation , neg COPD, Not current smoker,           Cardiovascular hypertension, Pt. on medications (-) Past MI and (-) CHF + dysrhythmias Atrial Fibrillation (-) pacemaker(-) Valvular Problems/Murmurs     Neuro/Psych neg Seizures Depression TIA (confusion and disorientation)   GI/Hepatic Neg liver ROS, neg GERD  ,  Endo/Other  neg diabetes  Renal/GU negative Renal ROS     Musculoskeletal   Abdominal   Peds  Hematology   Anesthesia Other Findings   Reproductive/Obstetrics                            Anesthesia Physical Anesthesia Plan  ASA: III  Anesthesia Plan: General   Post-op Pain Management:    Induction: Intravenous  PONV Risk Score and Plan: 2 and Dexamethasone and Ondansetron  Airway Management Planned: Oral ETT  Additional Equipment:   Intra-op Plan:   Post-operative Plan:   Informed Consent: I have reviewed the patients History and Physical, chart, labs and discussed the procedure including the risks, benefits and alternatives for the proposed anesthesia with the patient or authorized representative who has indicated his/her understanding and acceptance.       Plan Discussed with:   Anesthesia Plan Comments:         Anesthesia Quick Evaluation

## 2018-11-01 NOTE — Anesthesia Post-op Follow-up Note (Signed)
Anesthesia QCDR form completed.        

## 2018-11-05 ENCOUNTER — Other Ambulatory Visit: Payer: Self-pay

## 2018-11-05 ENCOUNTER — Ambulatory Visit: Payer: Medicare PPO

## 2018-11-05 ENCOUNTER — Ambulatory Visit (INDEPENDENT_AMBULATORY_CARE_PROVIDER_SITE_OTHER): Payer: Medicare PPO

## 2018-11-05 DIAGNOSIS — N35011 Post-traumatic bulbous urethral stricture: Secondary | ICD-10-CM

## 2018-11-05 DIAGNOSIS — R350 Frequency of micturition: Secondary | ICD-10-CM

## 2018-11-05 LAB — SURGICAL PATHOLOGY

## 2018-11-05 LAB — BLADDER SCAN AMB NON-IMAGING: Scan Result: 22

## 2018-11-05 NOTE — Progress Notes (Signed)
Fill and Pull Catheter Removal  Patient is present today for a catheter removal.  Patient was cleaned and prepped in a sterile fashion  423ml of sterile water/ saline was instilled into the bladder when the patient felt the urge to urinate. 24ml of water was then drained from the balloon.  A 24FR foley cath was removed from the bladder no complications were noted .  Patient as then given some time to void on their own.  Patient can void  161ml on their own after some time.  Patient tolerated well.  Performed by: Gaspar Cola Cma   Follow up/ Additional notes: as scheduled    Patient came back this [place] his PVR was 22

## 2018-11-07 ENCOUNTER — Ambulatory Visit: Payer: Medicare PPO | Admitting: Urology

## 2018-11-07 ENCOUNTER — Telehealth: Payer: Self-pay

## 2018-11-07 NOTE — Telephone Encounter (Signed)
-----   Message from Billey Co, MD sent at 11/07/2018  8:19 AM EDT ----- No cancer seen on prostate chips from HoLEP, great news, keep follow up as scheduled  Nickolas Madrid, MD 11/07/2018

## 2018-11-07 NOTE — Telephone Encounter (Signed)
Patient notified

## 2018-11-25 ENCOUNTER — Telehealth: Payer: Self-pay | Admitting: Urology

## 2018-11-25 NOTE — Telephone Encounter (Signed)
Pt is having bloody urine and pain in his groin he would like a call back to see if this is normal after surgery. Please advise.

## 2018-11-25 NOTE — Telephone Encounter (Signed)
It is normal to have some blood in his urine.  He has a appt on 12/09/2018

## 2018-12-09 ENCOUNTER — Ambulatory Visit (INDEPENDENT_AMBULATORY_CARE_PROVIDER_SITE_OTHER): Payer: Medicare PPO | Admitting: Urology

## 2018-12-09 ENCOUNTER — Other Ambulatory Visit: Payer: Self-pay

## 2018-12-09 ENCOUNTER — Encounter: Payer: Self-pay | Admitting: Urology

## 2018-12-09 VITALS — BP 143/73 | HR 116 | Ht 73.0 in | Wt 185.0 lb

## 2018-12-09 DIAGNOSIS — N138 Other obstructive and reflux uropathy: Secondary | ICD-10-CM

## 2018-12-09 DIAGNOSIS — N3281 Overactive bladder: Secondary | ICD-10-CM | POA: Diagnosis not present

## 2018-12-09 DIAGNOSIS — N401 Enlarged prostate with lower urinary tract symptoms: Secondary | ICD-10-CM | POA: Diagnosis not present

## 2018-12-09 LAB — BLADDER SCAN AMB NON-IMAGING

## 2018-12-09 NOTE — Progress Notes (Signed)
   12/09/2018 6:48 PM   Gary Dawson Gary Dawson [birth year] [id number]  Reason for visit: Follow up HOLEP  HPI: I saw Gary Dawson for 5-week follow-up after undergoing an uncomplicated HOLEP on 02/77/4128.  He is a 78 year old male with atrial fibrillation on Xarelto who presented with multiple 2 to 3 cm bladder stones, 94 g prostate, and obstructive urinary symptoms confirmed on urodynamics.  On HoLEP, 94 g of tissue were removed, and pathology was benign.  Post-op, he is having some expected frequency and urgency with urination, as well as ongoing nocturia 4-5 times per night which was present pre-op.  He is voiding with a very strong stream.  He has had some intermittent hematuria in the couple of weeks after surgery on his Xarelto, however this is continued to improve, and urine has been clear the last few days.  He denies any dysuria.  He has sleep apnea, but is not always compliant with his CPAP.  IPSS score today is 15, with quality of life terrible.  PVR 0 mL.  I had a very long conversation with the patient reassuring him that we expect his symptoms to continue to improve, and we reviewed at length the normal postop course after HOLEP in patients who have had long-term bladder outlet obstruction and high pressure voiding on urodynamics.  We discussed behavioral strategies including timed voiding, avoiding bladder irritants like coffee, soda, tea, and diet drinks, minimizing fluids in the evening, double voiding prior to bed.  -RTC 2 months for symptom check -Behavioral strategies discussed at length -Samples of Myrbetriq 50 mg daily given if he would like to try these for his OAB symptoms temporarily while his bladder adjusts post-HOLEP  A total of 15 minutes were spent face-to-face with the patient, greater than 50% was spent in patient education, counseling, and coordination of care regarding HOLEP, reassurance on standard postop course, and bladder outlet obstruction/OAB.  Billey Co, [facility] [address] [place], [place] [address] [phone]

## 2018-12-09 NOTE — Patient Instructions (Addendum)
Mirabegron extended-release tablets What is this medicine? MIRABEGRON (MIR a BEG ron) is used to treat overactive bladder. This medicine reduces the amount of bathroom visits. It may also help to control wetting accidents. It may be used alone, but sometimes may be given with other treatments. This medicine may be used for other purposes; ask your health care provider or pharmacist if you have questions. COMMON BRAND NAME(S): Myrbetriq What should I tell my health care provider before I take this medicine? They need to know if you have any of these conditions:  high blood pressure  kidney disease  liver disease  problems urinating  prostate disease  an unusual or allergic reaction to mirabegron, other medicines, foods, dyes, or preservatives  pregnant or trying to get pregnant  breast-feeding How should I use this medicine? Take this medicine by mouth with a glass of water. Follow the directions on the prescription label. Do not cut, crush or chew this medicine. You can take it with or without food. If it upsets your stomach, take it with food. Take your medicine at regular intervals. Do not take it more often than directed. Do not stop taking except on your doctor's advice. Talk to your pediatrician regarding the use of this medicine in children. Special care may be needed. Overdosage: If you think you have taken too much of this medicine contact a poison control center or emergency room at once. NOTE: This medicine is only for you. Do not share this medicine with others. What if I miss a dose? If you miss a dose, take it as soon as you can. If it is almost time for your next dose, take only that dose. Do not take double or extra doses. What may interact with this medicine?  codeine  desipramine  digoxin  flecainide  MAOIs like Carbex, Eldepryl, Marplan, Nardil, and Parnate  methadone  metoprolol  pimozide  propafenone  thioridazine  warfarin This list may not  describe all possible interactions. Give your health care provider a list of all the medicines, herbs, non-prescription drugs, or dietary supplements you use. Also tell them if you smoke, drink alcohol, or use illegal drugs. Some items may interact with your medicine. What should I watch for while using this medicine? Visit your doctor or health care professional for regular checks on your progress. Check your blood pressure as directed. Ask your doctor or health care professional what your blood pressure should be and when you should contact him or her. You may need to limit your intake of tea, coffee, caffeinated sodas, or alcohol. These drinks may make your symptoms worse. What side effects may I notice from receiving this medicine? Side effects that you should report to your doctor or health care professional as soon as possible:  allergic reactions like skin rash, itching or hives, swelling of the face, lips, or tongue  high blood pressure  fast, irregular heartbeat  redness, blistering, peeling or loosening of the skin, including inside the mouth  signs of infection like fever or chills; pain or difficulty passing urine  trouble passing urine or change in the amount of urine Side effects that usually do not require medical attention (report to your doctor or health care professional if they continue or are bothersome):  constipation  dry mouth  headache  runny nose  stomach upset This list may not describe all possible side effects. Call your doctor for medical advice about side effects. You may report side effects to FDA at 1-800-FDA-1088. Where should   I keep my medicine? Keep out of the reach of children. Store at room temperature between 15 and 30 degrees C (59 and 86 degrees F). Throw away any unused medicine after the expiration date. NOTE: This sheet is a summary. It may not cover all possible information. If you have questions about this medicine, talk to your doctor,  pharmacist, or health care provider.  2020 Elsevier/Gold Standard (2016-05-25 11:33:21)  Holmium Laser Enucleation of the Prostate (HoLEP)  HoLEP is a treatment for men with benign prostatic hyperplasia (BPH). The laser surgery removed blockages of urine flow, and is done without any incisions on the body.     What is HoLEP?  HoLEP is a type of laser surgery used to treat obstruction (blockage) of urine flow as a result of benign prostatic hyperplasia (BPH). In men with BPH, the prostate gland is not cancerous, but has become enlarged. An enlarged prostate can result in a number of urinary tract symptoms such as weak urinary stream, difficulty in starting urination, inability to urinate, frequent urination, or getting up at night to urinate.  HoLEP was developed in the 1990's as a more effective and less expensive surgical option for BPH, compared to other surgical options such as laser vaporization(PVP/greenlight laser), transurethral resection of the prostate(TURP), and open simple prostatectomy.   What happens during a HoLEP?  HoLEP requires general anesthesia ("asleep" throughout the procedure).   An antibiotic is given to reduce the risk of infection  A surgical instrument called a resectoscope is inserted through the urethra (the tube that carries urine from the bladder). The resectoscope has a camera that allows the surgeon to view the internal structure of the prostate gland, and to see where the incisions are being made during surgery.  The laser is inserted into the resectoscope and is used to enucleate (free up) the enlarged prostate tissue from the capsule (outer shell) and then to seal up any blood vessels. The tissue that has been removed is pushed back into the bladder.  A morcellator is placed through the resectoscope, and is used to suction out the prostate tissue that has been pushed into the bladder.  When the prostate tissue has been removed, the resectoscope is  removed, and a foley catheter is placed to allow healing and drain the urine from the bladder.     What happens after a HoLEP?  More than 90% of patients go home the same day a few hours after surgery. Less than 10% will be admitted to the hospital overnight for observation to monitor the urine, or if they have other medical problems.  Fluid is flushed through the catheter for about 1 hour after surgery to clear any blood from the urine. It is normal to have some blood in the urine after surgery. The need for blood transfusion is extremely rare.  Eating and drinking are permitted after the procedure once the patient has fully awakened from anesthesia.  The catheter is usually removed 2-3 days after surgery- the patient will come to clinic to have the catheter removed and make sure they can urinate on their own.  It is very important to drink lots of fluids after surgery for one week to keep the bladder flushed.  At first, there may be some burning with urination, but this typically improved within a few hours to days. Most patients do not have a significant amount of pain, and narcotic pain medications are rarely needed.  Symptoms of urinary frequency, urgency, and even leakage are NORMAL  for the first few weeks after surgery as the bladder adjusts after having to work hard against blockage from the prostate for many years. This will improve, but can sometimes take several months.  The use of pelvic floor exercises (Kegel exercises) can help improve problems with urinary incontinence.   After catheter removal, patients will be seen at 6 weeks and 6 months for symptom check  No heavy lifting for at least 2-3 weeks after surgery, however patients can walk and do light activities the first day after surgery. Return to work time depends on occupation.    What are the advantages of HoLEP?  HoLEP has been studied in many different parts of the world and has been shown to be a safe and  effective procedure. Although there are many types of BPH surgeries available, HoLEP offers a unique advantage in being able to remove a large amount of tissue without any incisions on the body, even in very large prostates, while decreasing the risk of bleeding and providing tissue for pathology (to look for cancer). This decreases the need for blood transfusions during surgery, minimizes hospital stay, and reduces the risk of needing repeat treatment.  What are the side effects of HoLEP?  Temporary burning and bleeding during urination. Some blood may be seen in the urine for weeks after surgery and is part of the healing process.  Urinary incontinence (inability to control urine flow) is expected in all patients immediately after surgery and they should wear pads for the first few days/weeks. This typically improves over the course of several weeks. Performing Kegel exercises can help decrease leakage from stress maneuvers such as coughing, sneezing, or lifting. The rate of long term leakage is very low. Patients may also have leakage with urgency and this may be treated with medication. The risk of urge incontinence can be dependent on several factors including age, prostate size, symptoms, and other medical problems.  Retrograde ejaculation or "backwards ejaculation." In 75% of cases, the patient will not see any fluid during ejaculation after surgery.  Erectile function is generally not significantly affected.   What are the risks of HoLEP?  Injury to the urethra or development of scar tissue at a later date  Injury to the capsule of the prostate (typically treated with longer catheterization).  Injury to the bladder or ureteral orifices (where the urine from the kidney drains out)  Infection of the bladder, testes, or kidneys  Return of urinary obstruction at a later date requiring another operation (<2%)  Need for blood transfusion or re-operation due to bleeding  Failure to relieve  all symptoms and/or need for prolonged catheterization after surgery  5-15% of patients are found to have previously undiagnosed prostate cancer in their specimen. Prostate cancer can be treated after HoLEP.  Standard risks of anesthesia including blood clots, heart attacks, etc  When should I call my doctor?  Fever over 101.3 degrees  Inability to urinate, or large blood clots in the urine

## 2018-12-23 ENCOUNTER — Other Ambulatory Visit: Payer: Self-pay

## 2018-12-23 MED ORDER — MIRABEGRON ER 50 MG PO TB24: 50.0000 mg | ORAL_TABLET | Freq: Every day | ORAL | 3 refills | Status: DC

## 2019-02-02 ENCOUNTER — Other Ambulatory Visit: Payer: Self-pay | Admitting: Cardiovascular Disease

## 2019-02-04 ENCOUNTER — Other Ambulatory Visit: Payer: Self-pay | Admitting: Cardiovascular Disease

## 2019-02-04 NOTE — Telephone Encounter (Signed)
Prescription refill request for Xarelto received.   Last office visit: 09/25/2018, [place] Weight: 83.9 kg Age: 78yo Scr: 0.89, 09/19/2018 CrCl: 82 ml/min   Prescription refill sent.

## 2019-02-04 NOTE — Telephone Encounter (Signed)
Please review for refill, Thanks !  

## 2019-02-10 ENCOUNTER — Other Ambulatory Visit: Payer: Self-pay

## 2019-02-10 DIAGNOSIS — Z20822 Contact with and (suspected) exposure to covid-19: Secondary | ICD-10-CM

## 2019-02-11 LAB — NOVEL CORONAVIRUS, NAA: SARS-CoV-2, NAA: NOT DETECTED

## 2019-02-26 ENCOUNTER — Encounter: Payer: Self-pay | Admitting: Urology

## 2019-02-26 ENCOUNTER — Ambulatory Visit: Payer: Medicare PPO | Admitting: Urology

## 2019-02-26 ENCOUNTER — Other Ambulatory Visit: Payer: Self-pay

## 2019-02-26 VITALS — BP 156/90 | HR 114 | Ht 73.0 in | Wt 185.0 lb

## 2019-02-26 DIAGNOSIS — N401 Enlarged prostate with lower urinary tract symptoms: Secondary | ICD-10-CM

## 2019-02-26 DIAGNOSIS — N138 Other obstructive and reflux uropathy: Secondary | ICD-10-CM | POA: Diagnosis not present

## 2019-02-26 LAB — BLADDER SCAN AMB NON-IMAGING

## 2019-02-26 NOTE — Patient Instructions (Signed)
1. Avoid teas/sodas/coffee to improve urinary urgency and leakage 2. Use CPAP every night to help improve urination overnight 3. Start kegel exercises daily, do 3-4 sets of 10 repetitions holding the squeeze for ~10 seconds each time   Kegel Exercises  Kegel exercises can help strengthen your pelvic floor muscles. The pelvic floor is a group of muscles that support your rectum, small intestine, and bladder. In females, pelvic floor muscles also help support the womb (uterus). These muscles help you control the flow of urine and stool. Kegel exercises are painless and simple, and they do not require any equipment. Your provider may suggest Kegel exercises to:  Improve bladder and bowel control.  Improve sexual response.  Improve weak pelvic floor muscles after surgery to remove the uterus (hysterectomy) or pregnancy (females).  Improve weak pelvic floor muscles after prostate gland removal or surgery (males). Kegel exercises involve squeezing your pelvic floor muscles, which are the same muscles you squeeze when you try to stop the flow of urine or keep from passing gas. The exercises can be done while sitting, standing, or lying down, but it is best to vary your position. Exercises How to do Kegel exercises: 1. Squeeze your pelvic floor muscles tight. You should feel a tight lift in your rectal area. If you are a male, you should also feel a tightness in your vaginal area. Keep your stomach, buttocks, and legs relaxed. 2. Hold the muscles tight for up to 10 seconds. 3. Breathe normally. 4. Relax your muscles. 5. Repeat as told by your health care provider. Repeat this exercise daily as told by your health care provider. Continue to do this exercise for at least 4-6 weeks, or for as long as told by your health care provider. You may be referred to a physical therapist who can help you learn more about how to do Kegel exercises. Depending on your condition, your health care provider may  recommend:  Varying how long you squeeze your muscles.  Doing several sets of exercises every day.  Doing exercises for several weeks.  Making Kegel exercises a part of your regular exercise routine. This information is not intended to replace advice given to you by your health care provider. Make sure you discuss any questions you have with your health care provider. Document Revised: 08/22/2017 Document Reviewed: 08/22/2017 Elsevier Patient Education  2020 [employer].

## 2019-02-26 NOTE — Progress Notes (Signed)
   02/26/2019 10:34 AM   [name] 11-04-1940 [id number]  Reason for visit: Follow up HoLEP  HPI: I saw Mr. Steeves in urology clinic today for follow-up after HoLEP.  He is a 79 year old male with atrial fibrillation on Xarelto and sleep apnea on CPAP who originally presented in the fall 2020 with multiple 2 to 3 cm bladder stones, 94 g prostate, and obstructive urinary symptoms confirmed on urodynamics.  He underwent an uncomplicated HoLEP on 11/01/2018 with removal of 78 g tissue and pathology was benign.  He reports he is voiding with a strong stream, but still has some occasional urinary urgency and incontinence that is requiring a diaper.  He reports some days he does not leak at all, but others he will leak a few tablespoons of urine.  He has cut back on dairy which has significantly improved some of his GI distress and gas.  He has sleep apnea and is non-compliant with his CPAP, and continues to have nocturia 3-4 times per night.  He drinks mostly green and flavored tea during the day.  PVR today normal at 125 mL.  He has not done any Kegel exercises.  He was previously tried on Myrbetriq for some of his overactive symptoms, but he did not note any significant improvement and stopped this medication.  I had a long conversation with the patient again today about postop expectations after HOLEP, especially in the setting of chronic bladder outlet obstruction.  He has improved over the last few months and his urgency and incontinence have decreased significantly since her last visit.  I again reviewed the importance of CPAP compliance for improving his nocturia, avoiding bladder irritants like tea, and performing Kegel exercises to strengthen the pelvic floor.  RTC 6 months PVR and symptom check If still having bothersome urgency or incontinence at that time will refer to pelvic floor physical therapy  I spent 20 minutes on the day of the encounter including pre-visit review of  the medical record, face-to-face time with the patient, and post visit ordering of labs/imaging/tests.  [name], MD  [facility] [address], [address] [place], [place] [address] [phone]

## 2019-05-26 DIAGNOSIS — G8929 Other chronic pain: Secondary | ICD-10-CM | POA: Insufficient documentation

## 2019-09-03 ENCOUNTER — Encounter: Payer: Self-pay | Admitting: Urology

## 2019-09-03 ENCOUNTER — Other Ambulatory Visit: Payer: Self-pay

## 2019-09-03 ENCOUNTER — Ambulatory Visit: Payer: Medicare PPO | Admitting: Urology

## 2019-09-03 VITALS — BP 145/89 | HR 79 | Ht 73.0 in | Wt 175.5 lb

## 2019-09-03 DIAGNOSIS — N138 Other obstructive and reflux uropathy: Secondary | ICD-10-CM

## 2019-09-03 DIAGNOSIS — N401 Enlarged prostate with lower urinary tract symptoms: Secondary | ICD-10-CM | POA: Diagnosis not present

## 2019-09-03 LAB — BLADDER SCAN AMB NON-IMAGING

## 2019-09-03 NOTE — Progress Notes (Signed)
   09/03/2019 4:54 PM   Gary Dawson Gary Dawson 02-09-1940 [id number]  Reason for visit: Follow up HOLEP  HPI: I saw Gary Dawson back in urology clinic for HOLEP follow-up. Briefly, he is a 79 year old male with history of atrial fibrillation on Xarelto as well as sleep apnea noncompliant with CPAP who underwent an uncomplicated HOLEP on 11/01/2018 with removal of 78 g of benign prostate tissue, and cystolitholapaxy for multiple 2 cm bladder stones. He had urodynamics preoperatively confirming obstructive symptoms.  He reports he has been doing very well over the last few weeks. He denies any significant incontinence or urgency. He is voiding with a strong stream. He is no longer needing to wear depends. He has had some problems with GI distress and intermittent constipation over the last year, and is finally found a diet that works well for him. He has cut out dairy. He was previously drinking a fair amount of green and flavored tea, and I previously counseled him to cut back on this. Now that he is changed over more to water, he reports his urinary symptoms have improved significantly. PVR today is normal at 0 mL.  RTC 1 year symptom check  I spent 25 total minutes on the day of the encounter including pre-visit review of the medical record, face-to-face time with the patient, and post visit ordering of labs/imaging/tests.  [name], MD  [facility] [address], [address] [place], [place] [address] [phone]

## 2019-09-03 NOTE — Patient Instructions (Addendum)
Avoid green tea, artificially sweetened drinks, and sodas, as these can irritate the bladder. Water is best.  Manage constipation, as worsening constipation can cause worsening urinary symptoms  Using your sleep apnea machine overnight will significantly improve the overnight urination

## 2019-09-12 ENCOUNTER — Other Ambulatory Visit: Payer: Self-pay | Admitting: Cardiovascular Disease

## 2019-09-12 NOTE — Telephone Encounter (Signed)
Prescription refill request for Xarelto received.   Last office visit: Gollan, 09/25/2018 Weight: 79.6 kg Age: 79 y.o. Scr: 0.89, 09/19/2018 CrCl: 57ml/min   Prescription refill sent.

## 2019-09-12 NOTE — Telephone Encounter (Signed)
Refill request

## 2019-09-29 ENCOUNTER — Ambulatory Visit (INDEPENDENT_AMBULATORY_CARE_PROVIDER_SITE_OTHER): Payer: Medicare PPO

## 2019-09-29 ENCOUNTER — Other Ambulatory Visit: Payer: Self-pay

## 2019-09-29 ENCOUNTER — Ambulatory Visit
Admission: EM | Admit: 2019-09-29 | Discharge: 2019-09-29 | Disposition: A | Discharge status: Home / Self Care | Payer: Medicare PPO | Attending: Physician Assistant | Admitting: Physician Assistant

## 2019-09-29 ENCOUNTER — Encounter: Payer: Self-pay | Admitting: Emergency Medicine

## 2019-09-29 DIAGNOSIS — M25511 Pain in right shoulder: Secondary | ICD-10-CM

## 2019-09-29 DIAGNOSIS — W19XXXA Unspecified fall, initial encounter: Secondary | ICD-10-CM

## 2019-09-29 DIAGNOSIS — S2341XA Sprain of ribs, initial encounter: Secondary | ICD-10-CM

## 2019-09-29 DIAGNOSIS — R0781 Pleurodynia: Secondary | ICD-10-CM

## 2019-09-29 DIAGNOSIS — M79601 Pain in right arm: Secondary | ICD-10-CM

## 2019-09-29 NOTE — ED Provider Notes (Signed)
[facility]    CSN: [id number] Arrival date & time: 09/29/19  0946      History   Chief Complaint Chief Complaint  Patient presents with  . Fall    HPI Gary Dawson is a 79 y.o. male. Patient presents for multiple injuries following a fall 3 days ago. He admits to a chronic bilateral weak ankle condition that causes him to fall sometimes. He says he he was outside and fell while trying to turn while mowing his yard. Admits to 8/10 rib pain on the right and 6/10 right shoulder and humerus pain. He says it hurts to raise the arm. Patient denies any symptoms of chest pain, SOB, visual disturbance, headaches, syncope. Denies head injury or LOC. Past medical history significant for CVA, sleep apnea, hypertension, a-fib and chronic anticoagulant use. He says he has not really taken anything for pain. No other injuries or concerns.     HPI  Past Medical History:  Diagnosis Date  . Atypical chest pain    a. 03/2012 St echo: nl EF, no wma's.  . CVA (cerebral vascular accident) (HCC)   . Depression   . Dysrhythmia   . Elevated PSA   . Essential hypertension   . Hyperlipidemia   . MVP (mitral valve prolapse)   . Persistent atrial fibrillation (HCC)    a. CHA2DS2VASc = 4-->chronic Xarelto.  . Psoriasis   . Reflux   . Sleep apnea   . Vitamin B 12 deficiency     Patient Active Problem List   Diagnosis Date Noted  . Chronic pain of right ankle 05/26/2019  . Torticollis 12/15/2016  . Encounter for anticoagulation discussion and counseling 11/13/2014  . Increased frequency of urination 11/04/2013  . 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 Sleep disturbance 11/04/2013  . Pure hypercholesterolemia 05/22/2013  . Benign essential hypertension 05/08/2013  . Hyperlipidemia 05/08/2013  . Shortness of breath 05/08/2013  . Permanent atrial fibrillation (HCC) 05/06/2013  . CVA (cerebral vascular accident) (HCC) 05/06/2013  . Benign localized  prostatic hyperplasia with lower urinary tract symptoms (LUTS) 03/06/2012  . Calculus in bladder 03/06/2012  . 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 Elevated prostate specific antigen (PSA) 03/06/2012  . Incomplete emptying of bladder 03/06/2012  . Kidney stone 03/06/2012  . Microscopic hematuria 03/06/2012  . Sleep apnea in adult 08/27/2003  . Enlarged prostate 08/26/1973  . Irregular heart beat 08/26/1973    Past Surgical History:  Procedure Laterality Date  . COLONOSCOPY WITH PROPOFOL    . CYSTOSCOPY WITH LITHOLAPAXY N/A 11/01/2018   Procedure: CYSTOSCOPY WITH LITHOLAPAXY;  Surgeon: [name], MD;  Location: [facility];  Service: Urology;  Laterality: N/A;  . HERNIA REPAIR    . HOLEP-LASER ENUCLEATION OF THE PROSTATE WITH MORCELLATION N/A 11/01/2018   Procedure: HOLEP-LASER ENUCLEATION OF THE PROSTATE WITH MORCELLATION;  Surgeon: [name], MD;  Location: [facility];  Service: Urology;  Laterality: N/A;  . TONSILLECTOMY    . VARICOSE VEIN SURGERY         Home Medications    Prior to Admission medications   Medication Sig Start Date End Date Taking? Authorizing Provider  acetaminophen (TYLENOL) 325 MG tablet Take 650 mg by mouth every 6 (six) hours as needed for moderate pain.   Yes [provider]  diltiazem (CARDIZEM CD) 120 MG 24 hr capsule TAKE 1 CAPSULE (120 MG TOTAL) BY MOUTH DAILY. 02/03/19  Yes Gollan, Tollie Pizza, MD  hydroxypropyl methylcellulose / hypromellose (ISOPTO TEARS /  GONIOVISC) 2.5 % ophthalmic solution Place 1 drop into both eyes 2 (two) times daily as needed for dry eyes.   Yes [provider]  XARELTO 20 MG TABS tablet TAKE 1 TABLET EVERY DAY WITH SUPPER 09/12/19  Yes Gollan, Tollie Pizza, MD    Family History Family History  Problem Relation Age of Onset  . Heart attack Father   . Hypertension Father   . Hyperlipidemia Father     Social History Social History   Tobacco Use  . Smoking status: Never Smoker  . Smokeless  tobacco: Never Used  Vaping Use  . Vaping Use: Never used  Substance Use Topics  . Alcohol use: Yes    Alcohol/week: 1.0 standard drink    Types: 1 Glasses of wine per week    Comment: social   . Drug use: No     Allergies   Other and Lactase   Review of Systems Review of Systems  Constitutional: Negative for fatigue and fever.  Eyes: Negative for photophobia and visual disturbance.  Respiratory: Negative for shortness of breath.   Cardiovascular: Negative for chest pain and palpitations.  Gastrointestinal: Negative for nausea and vomiting.  Musculoskeletal: Positive for arthralgias, gait problem (chronic) and myalgias. Negative for back pain, joint swelling, neck pain and neck stiffness.  Skin: Negative for color change, rash and wound.  Neurological: Negative for dizziness, syncope, weakness, light-headedness, numbness and headaches.     Physical Exam Triage Vital Signs ED Triage Vitals  Enc Vitals Group     BP 09/29/19 1056 127/83     Pulse Rate 09/29/19 1056 79     Resp 09/29/19 1056 18     Temp 09/29/19 1056 98.6 F (37 C)     Temp Source 09/29/19 1056 Oral     SpO2 09/29/19 1056 100 %     Weight 09/29/19 1054 175 lb 7.8 oz (79.6 kg)     Height 09/29/19 1054 6\' 1"  (1.854 m)     Head Circumference --      Peak Flow --      Pain Score 09/29/19 1052 8     Pain Loc --      Pain Edu? --      Excl. in GC? --    No data found.  Updated Vital Signs BP 127/83 (BP Location: Left Arm)   Pulse 79   Temp 98.6 F (37 C) (Oral)   Resp 18   Ht 6\' 1"  (1.854 m)   Wt 175 lb 7.8 oz (79.6 kg)   SpO2 100%   BMI 23.15 kg/m       Physical Exam Vitals and nursing note reviewed.  Constitutional:      General: He is not in acute distress.    Appearance: Normal appearance. He is well-developed and normal weight. He is not toxic-appearing.  HENT:     Head: Normocephalic and atraumatic.  Eyes:     General: No scleral icterus.    Conjunctiva/sclera: Conjunctivae  normal.     Pupils: Pupils are equal, round, and reactive to light.  Cardiovascular:     Rate and Rhythm: Normal rate and regular rhythm.     Heart sounds: No murmur heard.   Pulmonary:     Effort: Pulmonary effort is normal. No respiratory distress.     Breath sounds: Normal breath sounds.  Abdominal:     Palpations: Abdomen is soft.     Tenderness: There is no abdominal tenderness.  Musculoskeletal:     Cervical back:  Neck supple.     Comments: CHEST: There is tenderness along the right lateral ribs diffusely.  R SHOULDER: There is diffuse TTP of humerus. Limited abduction and flexion beyond 90 degrees. 5/5 strength bilat UEs  Skin:    General: Skin is warm and dry.  Neurological:     General: No focal deficit present.     Mental Status: He is alert. Mental status is at baseline.     Cranial Nerves: No cranial nerve deficit.     Motor: No weakness.     Gait: Gait abnormal.  Psychiatric:        Mood and Affect: Mood normal.        Behavior: Behavior normal.        Thought Content: Thought content normal.      UC Treatments / Results  Labs (all labs ordered are listed, but only abnormal results are displayed) Labs Reviewed - No data to display  EKG   Radiology DG Ribs Unilateral W/Chest Right  Result Date: 09/29/2019 CLINICAL DATA:  Pain following fall EXAM: RIGHT RIBS AND CHEST - 3+ VIEW COMPARISON:  Chest radiograph July 03, 2008 FINDINGS: Frontal chest as well as oblique and cone-down rib images were obtained. There is an apparent nipple shadow on the right. Lungs are clear. Heart size is upper normal with pulmonary vascularity normal. No adenopathy. No pneumothorax or pleural effusion.  No evident rib fracture. IMPRESSION: No evident rib fracture.  Lungs clear.  Heart upper normal in size. Electronically Signed   By: Bretta Bang III M.D.   On: 09/29/2019 12:24   DG Shoulder Right  Result Date: 09/29/2019 CLINICAL DATA:  Pain following fall EXAM: RIGHT  SHOULDER - 2+ VIEW COMPARISON:  None. FINDINGS: Frontal, oblique, and Y scapular images were obtained. No fracture or dislocation. Joint spaces appear normal. No erosive change. Visualized right lung clear. IMPRESSION: No fracture or dislocation.  No evident arthropathy. Electronically Signed   By: Bretta Bang III M.D.   On: 09/29/2019 12:21    Procedures Procedures (including critical care time)  Medications Ordered in UC Medications - No data to display  Initial Impression / Assessment and Plan / UC Course  I have reviewed the triage vital signs and the nursing notes.  Pertinent labs & imaging results that were available during my care of the patient were reviewed by me and considered in my medical decision making (see chart for details).  Imaging is negative for fractures today.  I asked patient to rest and ice the affected areas.  Advised him he can take Tylenol for pain relief.  He declined anything stronger for pain at this time.  I advised him to follow-up with orthopedics if he is not feeling better in the next couple of weeks.  Advised him that he should get checked out to make sure he does not have a rotator cuff tear arm ligament tear of the shoulder if he still cannot raise the arm.  Advised him to follow-up with our office as needed as well.     Final Clinical Impressions(s) / UC Diagnoses   Final diagnoses:  Sprain of costal cartilage, initial encounter  Fall, initial encounter  Right arm pain     Discharge Instructions     -The images are negative for fractures today. Rest, ice the affected areas and you may take Tylenol for pain relief. If pain is not improving follow up with orthopedics:  You have a condition requiring you to follow up with Orthopedics  so please call one of the following office for appointment:   [facility] [address] [place], [place] [address] Phone: [phone]  [facility] [address] [place], [place] [address] Phone:  [phone]     ED Prescriptions    None     PDMP not reviewed this encounter.   Shirlee Latch, PA-C 09/29/19 1951

## 2019-09-29 NOTE — ED Triage Notes (Signed)
Pt states he was mowing about 3 days ago and he fell twice. He states he has pain in his right arm, back, rib cage. He has been taking ibuprofen. He states he has a weak right ankle that may have caused his fall. He states fell on his right side and on the grass/nature area.

## 2019-09-29 NOTE — Discharge Instructions (Addendum)
-  The images are negative for fractures today. Rest, ice the affected areas and you may take Tylenol for pain relief. If pain is not improving follow up with orthopedics:  You have a condition requiring you to follow up with Orthopedics so please call one of the following office for appointment:   [facility] [address] [place], [place] [address] Phone: [phone]  [facility] [address] [place], [place] [address] Phone: [phone]
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
## 2019-11-18 ENCOUNTER — Other Ambulatory Visit: Payer: Self-pay

## 2019-11-18 ENCOUNTER — Ambulatory Visit: Payer: Medicare PPO | Admitting: Cardiovascular Disease

## 2019-11-18 ENCOUNTER — Encounter: Payer: Self-pay | Admitting: Cardiovascular Disease

## 2019-11-18 VITALS — BP 130/80 | HR 133 | Ht 73.0 in | Wt 176.0 lb

## 2019-11-18 DIAGNOSIS — I4821 Permanent atrial fibrillation: Secondary | ICD-10-CM | POA: Diagnosis not present

## 2019-11-18 DIAGNOSIS — R0602 Shortness of breath: Secondary | ICD-10-CM

## 2019-11-18 DIAGNOSIS — E782 Mixed hyperlipidemia: Secondary | ICD-10-CM

## 2019-11-18 DIAGNOSIS — I10 Essential (primary) hypertension: Secondary | ICD-10-CM | POA: Diagnosis not present

## 2019-11-18 DIAGNOSIS — I6310 Cerebral infarction due to embolism of unspecified precerebral artery: Secondary | ICD-10-CM

## 2019-11-18 MED ORDER — DILTIAZEM HCL ER COATED BEADS 120 MG PO CP24: 120.0000 mg | ORAL_CAPSULE | Freq: Every day | ORAL | 3 refills | Status: DC

## 2019-11-18 NOTE — Patient Instructions (Signed)
Medication Instructions:  No changes  If you need a refill on your cardiac medications before your next appointment, please call your pharmacy.    Lab work: No new labs needed   If you have labs (blood work) drawn today and your tests are completely normal, you will receive your results only by: . MyChart Message (if you have MyChart) OR . A paper copy in the mail If you have any lab test that is abnormal or we need to change your treatment, we will call you to review the results.   Testing/Procedures: No new testing needed   Follow-Up: At [facility], you and your health needs are our priority.  As part of our continuing mission to provide you with exceptional heart care, we have created designated Provider Care Teams.  These Care Teams include your primary Cardiologist (physician) and Advanced Practice Providers (APPs -  Physician Assistants and Nurse Practitioners) who all work together to provide you with the care you need, when you need it.  . You will need a follow up appointment in 12 months  . Providers on your designated Care Team:   . Christopher Berge, NP . Ryan Dunn, PA-C . Jacquelyn Visser, PA-C  Any Other Special Instructions Will Be Listed Below (If Applicable).  COVID-19 Vaccine Information can be found at: [email or web address] For questions related to vaccine distribution or appointments, please email [email or web address] or call [phone].     

## 2020-01-28 ENCOUNTER — Other Ambulatory Visit: Payer: Self-pay

## 2020-01-28 ENCOUNTER — Ambulatory Visit
Admission: EM | Admit: 2020-01-28 | Discharge: 2020-01-28 | Disposition: A | Discharge status: Home / Self Care | Payer: Medicare PPO | Attending: Family Medicine | Admitting: Family Medicine

## 2020-01-28 ENCOUNTER — Encounter: Payer: Self-pay | Admitting: Emergency Medicine

## 2020-01-28 DIAGNOSIS — Z20822 Contact with and (suspected) exposure to covid-19: Secondary | ICD-10-CM | POA: Diagnosis not present

## 2020-01-28 DIAGNOSIS — J029 Acute pharyngitis, unspecified: Secondary | ICD-10-CM | POA: Diagnosis present

## 2020-01-28 NOTE — ED Provider Notes (Signed)
[facility]    CSN: [id number] Arrival date & time: 01/28/20  1131      History   Chief Complaint Chief Complaint  Patient presents with  . Sore Throat   HPI  80 year old male presents with sore throat.  Patient reports sore throat for the past few days.  His coworker was concerned and advocated that he come to get tested.  He has no other respiratory symptoms.  He states that this often happens in the winter months.  No fever.  No relieving factors.  Desires COVID testing today.  No other complaints.  Past Medical History:  Diagnosis Date  . Atypical chest pain    a. 03/2012 St echo: nl EF, no wma's.  . CVA (cerebral vascular accident) (HCC)   . Depression   . Dysrhythmia   . Elevated PSA   . Essential hypertension   . Hyperlipidemia   . MVP (mitral valve prolapse)   . Persistent atrial fibrillation (HCC)    a. CHA2DS2VASc = 4-->chronic Xarelto.  . Psoriasis   . Reflux   . Sleep apnea   . Vitamin B 12 deficiency     Patient Active Problem List   Diagnosis Date Noted  . Chronic pain of right ankle 05/26/2019  . Torticollis 12/15/2016  . Encounter for anticoagulation discussion and counseling 11/13/2014  . Increased frequency of urination 11/04/2013  . 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 Sleep disturbance 11/04/2013  . Pure hypercholesterolemia 05/22/2013  . Benign essential hypertension 05/08/2013  . Hyperlipidemia 05/08/2013  . Shortness of breath 05/08/2013  . Permanent atrial fibrillation (HCC) 05/06/2013  . CVA (cerebral vascular accident) (HCC) 05/06/2013  . Benign localized prostatic hyperplasia with lower urinary tract symptoms (LUTS) 03/06/2012  . Calculus in bladder 03/06/2012  . 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 Elevated prostate specific antigen (PSA) 03/06/2012  . Incomplete emptying of bladder 03/06/2012  . Kidney stone 03/06/2012  . Microscopic hematuria 03/06/2012  . Sleep apnea in adult 08/27/2003  .  Enlarged prostate 08/26/1973  . Irregular heart beat 08/26/1973    Past Surgical History:  Procedure Laterality Date  . COLONOSCOPY WITH PROPOFOL    . CYSTOSCOPY WITH LITHOLAPAXY N/A 11/01/2018   Procedure: CYSTOSCOPY WITH LITHOLAPAXY;  Surgeon: [name], MD;  Location: [facility] ORS;  Service: Urology;  Laterality: N/A;  . HERNIA REPAIR    . HOLEP-LASER ENUCLEATION OF THE PROSTATE WITH MORCELLATION N/A 11/01/2018   Procedure: HOLEP-LASER ENUCLEATION OF THE PROSTATE WITH MORCELLATION;  Surgeon: [name], MD;  Location: [facility] ORS;  Service: Urology;  Laterality: N/A;  . TONSILLECTOMY    . VARICOSE VEIN SURGERY         Home Medications    Prior to Admission medications   Medication Sig Start Date End Date Taking? Authorizing Provider  acetaminophen (TYLENOL) 325 MG tablet Take 650 mg by mouth every 6 (six) hours as needed for moderate pain.   Yes [provider]  diltiazem (CARDIZEM CD) 120 MG 24 hr capsule Take 1 capsule (120 mg total) by mouth daily. 11/18/19  Yes Gollan, Tollie Pizza, MD  hydroxypropyl methylcellulose / hypromellose (ISOPTO TEARS / GONIOVISC) 2.5 % ophthalmic solution Place 1 drop into both eyes 2 (two) times daily as needed for dry eyes.   Yes [provider]  XARELTO 20 MG TABS tablet TAKE 1 TABLET EVERY DAY WITH SUPPER 09/12/19  Yes Gollan, Tollie Pizza, MD    Family History Family History  Problem Relation Age of Onset  .  Heart attack Father   . Hypertension Father   . Hyperlipidemia Father     Social History Social History   Tobacco Use  . Smoking status: Never Smoker  . Smokeless tobacco: Never Used  Vaping Use  . Vaping Use: Never used  Substance Use Topics  . Alcohol use: Yes    Alcohol/week: 1.0 standard drink    Types: 1 Glasses of wine per week    Comment: social   . Drug use: No     Allergies   Other and Lactase   Review of Systems Review of Systems  Constitutional: Negative.   HENT: Positive for sore  throat.    Physical Exam Triage Vital Signs ED Triage Vitals  Enc Vitals Group     BP 01/28/20 1322 (!) 141/83     Pulse Rate 01/28/20 1322 (!) 103     Resp 01/28/20 1322 18     Temp 01/28/20 1322 98.2 F (36.8 C)     Temp Source 01/28/20 1322 Oral     SpO2 01/28/20 1322 100 %     Weight 01/28/20 1321 175 lb 14.8 oz (79.8 kg)     Height 01/28/20 1321 6\' 1"  (1.854 m)     Head Circumference --      Peak Flow --      Pain Score 01/28/20 1320 2     Pain Loc --      Pain Edu? --      Excl. in GC? --    Updated Vital Signs BP (!) 141/83 (BP Location: Right Arm)   Pulse (!) 103   Temp 98.2 F (36.8 C) (Oral)   Resp 18   Ht 6\' 1"  (1.854 m)   Wt 79.8 kg   SpO2 100%   BMI 23.21 kg/m   Visual Acuity Right Eye Distance:   Left Eye Distance:   Bilateral Distance:    Right Eye Near:   Left Eye Near:    Bilateral Near:     Physical Exam Vitals and nursing note reviewed.  Constitutional:      General: He is not in acute distress.    Appearance: Normal appearance. He is not ill-appearing.  HENT:     Head: Normocephalic and atraumatic.     Mouth/Throat:     Pharynx: Oropharynx is clear. No oropharyngeal exudate or posterior oropharyngeal erythema.  Eyes:     General:        Right eye: No discharge.        Left eye: No discharge.     Conjunctiva/sclera: Conjunctivae normal.  Cardiovascular:     Rate and Rhythm: Tachycardia present. Rhythm irregularly irregular.  Pulmonary:     Effort: Pulmonary effort is normal.     Breath sounds: Normal breath sounds. No wheezing, rhonchi or rales.  Neurological:     Mental Status: He is alert.  Psychiatric:        Mood and Affect: Mood normal.        Behavior: Behavior normal.    UC Treatments / Results  Labs (all labs ordered are listed, but only abnormal results are displayed) Labs Reviewed  SARS CORONAVIRUS 2 (TAT 6-24 HRS)    EKG   Radiology No results found.  Procedures Procedures (including critical care  time)  Medications Ordered in UC Medications - No data to display  Initial Impression / Assessment and Plan / UC Course  I have reviewed the triage vital signs and the nursing notes.  Pertinent labs & imaging results  that were available during my care of the patient were reviewed by me and considered in my medical decision making (see chart for details).    80 year old male presents with pharyngitis.  Throat is clear.  Patient in A. fib currently.  This is chronic for him.  He is in no acute distress and appears well.  Awaiting COVID test results.  Supportive care.  Final Clinical Impressions(s) / UC Diagnoses   Final diagnoses:  Pharyngitis, unspecified etiology     Discharge Instructions     Check your mychart account for your results.  Take care  Dr. Adriana Simas    ED Prescriptions    None     PDMP not reviewed this encounter.   Tommie Sams, DO 01/28/20 1416

## 2020-01-28 NOTE — ED Notes (Signed)
Called patient listed phone number x 2 and from the waiting room no answer.

## 2020-01-28 NOTE — Discharge Instructions (Signed)
Check your mychart account for your results.  Take care  Dr. Adriana Simas

## 2020-01-28 NOTE — ED Triage Notes (Signed)
Patient c/o sore throat x 2 days. Denies any other symptoms. Would like to be tested for COVID.

## 2020-01-29 LAB — SARS CORONAVIRUS 2 (TAT 6-24 HRS): SARS Coronavirus 2: NEGATIVE

## 2020-02-07 ENCOUNTER — Other Ambulatory Visit: Payer: Self-pay | Admitting: Cardiovascular Disease

## 2020-02-09 NOTE — Telephone Encounter (Signed)
Please review for refill. Thanks!  

## 2020-02-09 NOTE — Telephone Encounter (Signed)
31m, 79.8kg, scr 0.9 (10/31/19), ccr 76.4, lovw/gollan 11/18/19,

## 2020-02-10 ENCOUNTER — Other Ambulatory Visit: Payer: Self-pay | Admitting: *Deleted

## 2020-02-10 ENCOUNTER — Telehealth: Payer: Self-pay | Admitting: Cardiovascular Disease

## 2020-02-10 MED ORDER — RIVAROXABAN 20 MG PO TABS: ORAL_TABLET | ORAL | 1 refills | Status: DC

## 2020-02-10 NOTE — Telephone Encounter (Signed)
Requested Prescriptions   Signed Prescriptions Disp Refills  . rivaroxaban (XARELTO) 20 MG TABS tablet 90 tablet 1    Sig: TAKE 1 TABLET EVERY DAY WITH SUPPER    Authorizing Provider: Antonieta Iba    Ordering User: Kendrick Fries   I spoke with pharmacy and pharmacy is aware that pt has Xarelto 20 mg tablets at this time and will run out in Feb 2022. [employer] requested a new Rx to place on hold due to no future refills left at this time.  They will auto refill in February when the pt is due.

## 2020-02-10 NOTE — Telephone Encounter (Signed)
Patient calling in stating that he received an order of Xarelto for 90 days from [facility] order but realized that a new script was just sent yesterday as well. Patient does not need more medication right now and would like for [place] to communicate with [facility] mail order pharmacy

## 2020-02-10 NOTE — Telephone Encounter (Signed)
Requested Prescriptions   Signed Prescriptions Disp Refills   rivaroxaban (XARELTO) 20 MG TABS tablet 90 tablet 1    Sig: TAKE 1 TABLET EVERY DAY WITH SUPPER    Authorizing Provider: Antonieta Iba    Ordering User: Kendrick Fries   I spoke to pharmacy and they are aware pt doesn't need any Xarelto 20 mg tablet at this time. Pt has enough tablets until Feb. They requested a new Rx due to pt not having any future refills on file. They will auto refill in February 2022. They will place his Rx on hold until then. They reach out a month ahead to request a new rx when there are no longer any refills left so that pt doesn't run out.

## 2020-03-19 ENCOUNTER — Emergency Department
Admission: EM | Admit: 2020-03-19 | Discharge: 2020-03-19 | Disposition: A | Discharge status: Home / Self Care | Payer: Medicare PPO | Attending: Emergency Medicine | Admitting: Emergency Medicine

## 2020-03-19 ENCOUNTER — Encounter: Payer: Self-pay | Admitting: Emergency Medicine

## 2020-03-19 ENCOUNTER — Other Ambulatory Visit: Payer: Self-pay

## 2020-03-19 ENCOUNTER — Emergency Department: Payer: Medicare PPO

## 2020-03-19 DIAGNOSIS — Z7901 Long term (current) use of anticoagulants: Secondary | ICD-10-CM | POA: Diagnosis not present

## 2020-03-19 DIAGNOSIS — S0003XA Contusion of scalp, initial encounter: Secondary | ICD-10-CM | POA: Diagnosis not present

## 2020-03-19 DIAGNOSIS — Z79899 Other long term (current) drug therapy: Secondary | ICD-10-CM | POA: Diagnosis not present

## 2020-03-19 DIAGNOSIS — I10 Essential (primary) hypertension: Secondary | ICD-10-CM | POA: Insufficient documentation

## 2020-03-19 DIAGNOSIS — W19XXXA Unspecified fall, initial encounter: Secondary | ICD-10-CM | POA: Diagnosis not present

## 2020-03-19 DIAGNOSIS — I4891 Unspecified atrial fibrillation: Secondary | ICD-10-CM | POA: Insufficient documentation

## 2020-03-19 DIAGNOSIS — Y92009 Unspecified place in unspecified non-institutional (private) residence as the place of occurrence of the external cause: Secondary | ICD-10-CM | POA: Insufficient documentation

## 2020-03-19 DIAGNOSIS — S0990XA Unspecified injury of head, initial encounter: Secondary | ICD-10-CM | POA: Diagnosis present

## 2020-03-19 LAB — COMPREHENSIVE METABOLIC PANEL
ALT: 35 U/L (ref 0–44)
AST: 40 U/L (ref 15–41)
Albumin: 4 g/dL (ref 3.5–5.0)
Alkaline Phosphatase: 85 U/L (ref 38–126)
Anion gap: 7 (ref 5–15)
BUN: 31 mg/dL — ABNORMAL HIGH (ref 8–23)
CO2: 27 mmol/L (ref 22–32)
Calcium: 9 mg/dL (ref 8.9–10.3)
Chloride: 107 mmol/L (ref 98–111)
Creatinine, Ser: 0.89 mg/dL (ref 0.61–1.24)
GFR, Estimated: >60 mL/min (ref >60)
Glucose, Bld: 101 mg/dL — ABNORMAL HIGH (ref 70–99)
Potassium: 4.1 mmol/L (ref 3.5–5.1)
Sodium: 141 mmol/L (ref 135–145)
Total Bilirubin: 0.9 mg/dL (ref 0.3–1.2)
Total Protein: 7.2 g/dL (ref 6.5–8.1)

## 2020-03-19 LAB — CBC
HCT: 39.9 % (ref 39.0–52.0)
Hemoglobin: 13.1 g/dL (ref 13.0–17.0)
MCH: 33.4 pg (ref 26.0–34.0)
MCHC: 32.8 g/dL (ref 30.0–36.0)
MCV: 101.8 fL — ABNORMAL HIGH (ref 80.0–100.0)
Platelets: 137 10*3/uL — ABNORMAL LOW (ref 150–400)
RBC: 3.92 MIL/uL — ABNORMAL LOW (ref 4.22–5.81)
RDW: 14.3 % (ref 11.5–15.5)
WBC: 6.3 10*3/uL (ref 4.0–10.5)
nRBC: 0 % (ref 0.0–0.2)

## 2020-03-19 NOTE — ED Triage Notes (Signed)
Pt states has been feeling off all day, states fell earlier today and fell 2 more times attempting to get up. Pt states hx of A-fib and takes a blood thinner, abrasion to posterior head dressed by first RN, bleeding controlled. Pt A&O x4, NAD noted in triage.

## 2020-03-19 NOTE — ED Provider Notes (Signed)
[facility] Emergency Department Provider Note  ____________________________________________  Time seen: Approximately 10:31 PM  I have reviewed the triage vital signs and the nursing notes.   HISTORY  Chief Complaint Fall and Dizziness    HPI Gary Dawson is a 80 y.o. male with a history of CVA, hypertension, atrial fibrillation on Xarelto who comes the ED due to head injury after falling.  Reports that he fell 3 times today which he attributes to instability of his right ankle which has been a chronic problem for him.  Denies vision changes, no neck pain, no paresthesias or motor weakness.  No significant headache.  Before falling, no chest pain shortness of breath or thunderclap headache.    Has been eating and drinking normally, which includes a diet that has resulted in about 40 pound weight loss over the last few months.      Past Medical History:  Diagnosis Date  . Atypical chest pain    a. 03/2012 St echo: nl EF, no wma's.  . CVA (cerebral vascular accident) (HCC)   . Depression   . Dysrhythmia   . Elevated PSA   . Essential hypertension   . Hyperlipidemia   . MVP (mitral valve prolapse)   . Persistent atrial fibrillation (HCC)    a. CHA2DS2VASc = 4-->chronic Xarelto.  . Psoriasis   . Reflux   . Sleep apnea   . Vitamin B 12 deficiency      Patient Active Problem List   Diagnosis Date Noted  . Chronic pain of right ankle 05/26/2019  . Torticollis 12/15/2016  . Encounter for anticoagulation discussion and counseling 11/13/2014  . Increased frequency of urination 11/04/2013  . 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 Sleep disturbance 11/04/2013  . Pure hypercholesterolemia 05/22/2013  . Benign essential hypertension 05/08/2013  . Hyperlipidemia 05/08/2013  . Shortness of breath 05/08/2013  . Permanent atrial fibrillation (HCC) 05/06/2013  . CVA (cerebral vascular accident) (HCC) 05/06/2013  . Benign  localized prostatic hyperplasia with lower urinary tract symptoms (LUTS) 03/06/2012  . Calculus in bladder 03/06/2012  . 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 Elevated prostate specific antigen (PSA) 03/06/2012  . Incomplete emptying of bladder 03/06/2012  . Kidney stone 03/06/2012  . Microscopic hematuria 03/06/2012  . Sleep apnea in adult 08/27/2003  . Enlarged prostate 08/26/1973  . Irregular heart beat 08/26/1973     Past Surgical History:  Procedure Laterality Date  . COLONOSCOPY WITH PROPOFOL    . CYSTOSCOPY WITH LITHOLAPAXY N/A 11/01/2018   Procedure: CYSTOSCOPY WITH LITHOLAPAXY;  Surgeon: [name], MD;  Location: [facility] ORS;  Service: Urology;  Laterality: N/A;  . HERNIA REPAIR    . HOLEP-LASER ENUCLEATION OF THE PROSTATE WITH MORCELLATION N/A 11/01/2018   Procedure: HOLEP-LASER ENUCLEATION OF THE PROSTATE WITH MORCELLATION;  Surgeon: [name], MD;  Location: [facility] ORS;  Service: Urology;  Laterality: N/A;  . TONSILLECTOMY    . VARICOSE VEIN SURGERY       Prior to Admission medications   Medication Sig Start Date End Date Taking? Authorizing Provider  acetaminophen (TYLENOL) 325 MG tablet Take 650 mg by mouth every 6 (six) hours as needed for moderate pain.    [provider]  diltiazem (CARDIZEM CD) 120 MG 24 hr capsule Take 1 capsule (120 mg total) by mouth daily. 11/18/19   Antonieta Iba, MD  hydroxypropyl methylcellulose / hypromellose (ISOPTO TEARS / GONIOVISC) 2.5 % ophthalmic solution Place 1 drop into both eyes 2 (two) times  daily as needed for dry eyes.    [provider]  rivaroxaban (XARELTO) 20 MG TABS tablet TAKE 1 TABLET EVERY DAY WITH SUPPER 02/10/20   Antonieta Iba, MD     Allergies Other and Lactase   Family History  Problem Relation Age of Onset  . Heart attack Father   . Hypertension Father   . Hyperlipidemia Father     Social History Social History   Tobacco Use  . Smoking status: Never Smoker  . Smokeless  tobacco: Never Used  Vaping Use  . Vaping Use: Never used  Substance Use Topics  . Alcohol use: Yes    Alcohol/week: 1.0 standard drink    Types: 1 Glasses of wine per week    Comment: social   . Drug use: No    Review of Systems  Constitutional:   No fever or chills.  ENT:   No sore throat. No rhinorrhea. Cardiovascular:   No chest pain or syncope. Respiratory:   No dyspnea or cough. Gastrointestinal:   Negative for abdominal pain, vomiting and diarrhea.  Musculoskeletal:   Negative for focal pain or swelling All other systems reviewed and are negative except as documented above in ROS and HPI.  ____________________________________________   PHYSICAL EXAM:  VITAL SIGNS: ED Triage Vitals  Enc Vitals Group     BP 03/19/20 1809 (!) 155/111     Pulse Rate 03/19/20 1809 (!) 117     Resp 03/19/20 1809 20     Temp 03/19/20 1809 97.9 F (36.6 C)     Temp Source 03/19/20 1809 Oral     SpO2 03/19/20 1809 98 %     Weight 03/19/20 1810 180 lb (81.6 kg)     Height 03/19/20 1810 6\' 1"  (1.854 m)     Head Circumference --      Peak Flow --      Pain Score 03/19/20 1809 6     Pain Loc --      Pain Edu? --      Excl. in GC? --     Vital signs reviewed, nursing assessments reviewed.   Constitutional:   Alert and oriented. Non-toxic appearance. Eyes:   Conjunctivae are normal. EOMI. PERRL. ENT      Head:   Normocephalic with small scalp hematoma at the posterior parietal scalp.  No laceration.  Hemostatic.Marland Kitchen      Nose:   Normal      Mouth/Throat:   Normal.      Neck:   No meningismus. Full ROM.  No midline tenderness Hematological/Lymphatic/Immunilogical:   No cervical lymphadenopathy. Cardiovascular:   RRR. Symmetric bilateral radial and DP pulses.  No murmurs. Cap refill less than 2 seconds. Respiratory:   Normal respiratory effort without tachypnea/retractions. Breath sounds are clear and equal bilaterally. No wheezes/rales/rhonchi. Gastrointestinal:   Soft and nontender.  Non distended. There is no CVA tenderness.  No rebound, rigidity, or guarding.  Musculoskeletal:   Normal range of motion in all extremities. No joint effusions.  No lower extremity tenderness.  No edema. Neurologic:   Normal speech and language.  Normal gait.  Normal cerebellar function Motor grossly intact. No acute focal neurologic deficits are appreciated.  Skin:    Skin is warm, dry and intact. No rash noted.  No petechiae, purpura, or bullae.  ____________________________________________    LABS (pertinent positives/negatives) (all labs ordered are listed, but only abnormal results are displayed) Labs Reviewed  CBC - Abnormal; Notable for the following components:  Result Value   RBC 3.92 (*)    MCV 101.8 (*)    Platelets 137 (*)    All other components within normal limits  COMPREHENSIVE METABOLIC PANEL - Abnormal; Notable for the following components:   Glucose, Bld 101 (*)    BUN 31 (*)    All other components within normal limits  URINALYSIS, COMPLETE (UACMP) WITH MICROSCOPIC  CBG MONITORING, ED   ____________________________________________   EKG  Interpreted by me Atrial fibrillation rate of 117.  Normal axis and intervals.  Normal QRS ST segments and T waves.  No acute ischemic changes.  ____________________________________________    RADIOLOGY  CT Head Wo Contrast  Result Date: 03/19/2020 CLINICAL DATA:  Head trauma fall EXAM: CT HEAD WITHOUT CONTRAST TECHNIQUE: Contiguous axial images were obtained from the base of the skull through the vertex without intravenous contrast. COMPARISON:  CT brain 01/08/2017 FINDINGS: Brain: No acute territorial infarction, hemorrhage, or intracranial mass. Chronic cerebellar infarcts. Moderate atrophy. Mild white matter hypodensity consistent with chronic small vessel ischemic change. Stable slightly enlarged ventricles likely due to atrophy. Vascular: No hyperdense vessels. Scattered carotid vascular calcification Skull:  Normal. Negative for fracture or focal lesion. Sinuses/Orbits: No acute finding. Other: Small posterior scalp swelling IMPRESSION: 1. No CT evidence for acute intracranial abnormality. 2. Atrophy and chronic small vessel ischemic changes of the white matter. Chronic cerebellar infarcts. Electronically Signed   By: Jasmine Pang M.D.   On: 03/19/2020 18:53   CT Cervical Spine Wo Contrast  Result Date: 03/19/2020 CLINICAL DATA:  Fall EXAM: CT CERVICAL SPINE WITHOUT CONTRAST TECHNIQUE: Multidetector CT imaging of the cervical spine was performed without intravenous contrast. Multiplanar CT image reconstructions were also generated. COMPARISON:  Radiograph 12/31/2014 FINDINGS: Alignment: Trace retrolisthesis C3 on C4 and C4 on C5. Facet alignment is maintained. Skull base and vertebrae: Craniovertebral junction is intact. Chronic superior endplate deformity at C7. Soft tissues and spinal canal: No prevertebral fluid or swelling. No visible canal hematoma. Disc levels: Multiple level degenerative change. Moderate disc space narrowing C3-C4, C4-C5 and C5-C6. Facet degenerative changes at multiple levels. Right greater than left foraminal narrowing C3 through C6. Upper chest: Right apical pleural calcification and scar Other: None IMPRESSION: 1. Trace retrolisthesis C3 on C4 and C4 on C5, likely degenerative. No acute osseous abnormality. Chronic superior endplate deformity at C7. 2. Multilevel degenerative changes. Electronically Signed   By: Jasmine Pang M.D.   On: 03/19/2020 19:00    ____________________________________________   PROCEDURES Procedures  ____________________________________________  DIFFERENTIAL DIAGNOSIS   Intracranial hemorrhage, C-spine fracture, scalp hematoma  CLINICAL IMPRESSION / ASSESSMENT AND PLAN / ED COURSE  Medications ordered in the ED: Medications - No data to display  Pertinent labs & imaging results that were available during my care of the patient were reviewed by  me and considered in my medical decision making (see chart for details).  Gary Dawson was evaluated in Emergency Department on 03/19/2020 for the symptoms described in the history of present illness. He was evaluated in the context of the global COVID-19 pandemic, which necessitated consideration that the patient might be at risk for infection with the SARS-CoV-2 virus that causes COVID-19. Institutional protocols and algorithms that pertain to the evaluation of patients at risk for COVID-19 are in a state of rapid change based on information released by regulatory bodies including the CDC and federal and state organizations. These policies and algorithms were followed during the patient's care in the ED.   Patient presents after mechanical fall  at home in the setting of chronic ambulatory dysfunction related to right ankle difficulty.  He has chronic A. fib, compliant with his medications, no worrisome prodromal symptoms.  A. fib is rate controlled.  He is ambulatory with steady gait, normal coordination and balance, neuro intact.  CT imaging of the head and neck is unremarkable.    Symptoms may be related to a degree of dehydration due to patient following a weight loss regimen that is resulted in 40 pounds of weight loss over the past few months.  Advised him to increase fluid intake, aim for maintenance of current weight for the next several weeks.  Stable for discharge home.      ____________________________________________   FINAL CLINICAL IMPRESSION(S) / ED DIAGNOSES    Final diagnoses:  Contusion of scalp, initial encounter     ED Discharge Orders    None      Portions of this note were generated with dragon dictation software. Dictation errors may occur despite best attempts at proofreading.   Sharman Cheek, MD 03/19/20 2235

## 2020-03-19 NOTE — Discharge Instructions (Signed)
Your CT scans of the head and neck today were okay.  Continue taking your medications as usual and follow-up with your doctor.

## 2020-03-19 NOTE — ED Notes (Signed)
Lab called due to results not populated. Lab system in repair currently. Charge RN and MD made aware.

## 2020-03-19 NOTE — ED Triage Notes (Signed)
First Nurse Note:  Arrives x/p fall x 3.  Patient states he felt dizzy and lost his balance.  Abrasion to back of head, bleeding controlled, DSD applied.  Denies LOC.  Denies neck pain.

## 2020-03-25 ENCOUNTER — Telehealth: Payer: Self-pay | Admitting: Cardiovascular Disease

## 2020-03-25 NOTE — Telephone Encounter (Signed)
Unable to reach pt via phone, LDM (okay via DPR). Advised the disoriented and fogginess feeling could be associated with his recent falls. To reach out to PCP or may call back with concerns.

## 2020-03-25 NOTE — Telephone Encounter (Signed)
Pt c/o medication issue:  1. Name of Medication: xarelto  2. How are you currently taking this medication (dosage and times per day)? 20 mg daily at dinner  3. Are you having a reaction (difficulty breathing--STAT)? n/a  4. What is your medication issue? Feels like he is in a fog and feels disoriented

## 2020-03-25 NOTE — Telephone Encounter (Signed)
[name] calling back, reports "I forgot I did hit my head and wasn't thinking this could be associated". Advised pt that sometimes after a fall and contusion of the head it can cause, dizziness, confusion, fogginess, and feeling of disoriented. Advised did review his ED labs and CT, noted to unremarkable results, although did strongly advised to reach out to PCP for an reevaluation if these symptoms are persisting. May want to recheck labs (RBC) since he is on Xarelto and maybe a repeat CT scan to check for any lingering small vessel bleeds that could be r/t the head contusion and Xarelto. Gary Dawson is currently A&O x4 on the phone, denies any pain or symptoms at this time. Will reach out to PCP if symptoms worsen and persist. Otherwise all questions or concerns were address and no additional concerns at this time. Agreeable to plan, will call back for anything further.

## 2020-04-06 ENCOUNTER — Ambulatory Visit: Payer: Medicare PPO | Admitting: Urology

## 2020-04-12 ENCOUNTER — Other Ambulatory Visit: Payer: Self-pay | Admitting: *Deleted

## 2020-04-12 DIAGNOSIS — N138 Other obstructive and reflux uropathy: Secondary | ICD-10-CM

## 2020-04-13 ENCOUNTER — Other Ambulatory Visit
Admission: RE | Admit: 2020-04-13 | Discharge: 2020-04-13 | Disposition: A | Discharge status: Home / Self Care | Payer: Medicare PPO | Attending: Urology | Admitting: Urology

## 2020-04-13 ENCOUNTER — Ambulatory Visit: Payer: Medicare PPO | Admitting: Urology

## 2020-04-13 ENCOUNTER — Other Ambulatory Visit: Payer: Self-pay

## 2020-04-13 ENCOUNTER — Encounter: Payer: Self-pay | Admitting: Urology

## 2020-04-13 VITALS — Resp 16 | Ht 73.0 in | Wt 195.0 lb

## 2020-04-13 DIAGNOSIS — N401 Enlarged prostate with lower urinary tract symptoms: Secondary | ICD-10-CM | POA: Diagnosis present

## 2020-04-13 DIAGNOSIS — N3281 Overactive bladder: Secondary | ICD-10-CM

## 2020-04-13 DIAGNOSIS — R351 Nocturia: Secondary | ICD-10-CM

## 2020-04-13 DIAGNOSIS — N138 Other obstructive and reflux uropathy: Secondary | ICD-10-CM | POA: Diagnosis present

## 2020-04-13 LAB — URINALYSIS, COMPLETE (UACMP) WITH MICROSCOPIC
Bilirubin Urine: NEGATIVE
Glucose, UA: NEGATIVE mg/dL
Leukocytes,Ua: NEGATIVE
Nitrite: NEGATIVE
Protein, ur: 100 mg/dL — AB
Specific Gravity, Urine: >1.03 — ABNORMAL HIGH (ref 1.005–1.030)
pH: 5.5 (ref 5.0–8.0)

## 2020-04-13 LAB — BLADDER SCAN AMB NON-IMAGING: Scan Result: 18

## 2020-04-13 NOTE — Progress Notes (Signed)
   04/13/2020 11:25 AM   Gary Dawson 04-Nov-1940 [id number]  Reason for visit: Follow up urinary symptoms, BPH/OAB  HPI: I saw Gary Dawson in urology clinic today for worsening urinary symptoms.  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  He had bothersome overactive symptoms as expected for the first few months postoperatively, but at our last visit was really doing very well.  He was not wearing depends at that time for any leakage and was urinating with a good stream and really had no complaints.  He was having some GI problems at that time that he was managing with diet and cutting out dairy.  He reports about a month of worsening urinary symptoms where he is having some urgency and frequency during the day, and it sounds like some urge incontinence requiring the depends.  He is a challenging historian, and I have difficulty following the history and timing over the last month of his urinary issues.  He continues to be noncompliant with his CPAP machine for sleep apnea.  Urinalysis today is completely benign, and PVR is normal at 18 mL.  We do long conversation about his urinary symptoms and the relationship between sleep apnea and nocturia, as well as long-term bladder damage and overactive bladder in patients who have had chronic obstruction for many years.  We discussed avoiding bladder irritants, compliance with CPAP, and a trial of Myrbetriq 50 mg daily for his urinary symptoms.  -Trial of Myrbetriq 50 mg daily, samples given.  Would be very hesitant to try anticholinergics with his GI issues at baseline as well as his frailty, but could consider trospium if no improvement on Myrbetriq or vibegron --Encourage compliance with CPAP machine for sleep apnea RTC 1 month for symptom  check-consider cystoscopy in the future if no improvement in symptoms on OAB medications  [name], MD  [facility] [address], [address] [place], [place] [address] [phone]

## 2020-04-13 NOTE — Patient Instructions (Addendum)
Patients with sleep apnea will often get up multiple times overnight to urinate.  Using your CPAP machine can significantly improve this.  Try the Myrbetriq daily to see if this helps with your overactive symptoms and urgency and frequency.

## 2020-04-16 ENCOUNTER — Telehealth: Payer: Self-pay | Admitting: Cardiovascular Disease

## 2020-04-16 NOTE — Telephone Encounter (Signed)
Spoke with the patient who states that his ankles and feet are swollen. He denies any shortness of breath. Denies any increased sodium in his diet. States that blood pressure has been good. He has no other complaints. Advised patient to elevate his legs to help with swelling. Will send message to Dr. Mariah Milling and his nurse for further advisement.

## 2020-04-16 NOTE — Telephone Encounter (Signed)
Pt c/o swelling: STAT is pt has developed SOB within 24 hours  1) How much weight have you gained and in what time span? None  2) If swelling, where is the swelling located? Legs and feet  3) Are you currently taking a fluid pill? no  4) Are you currently SOB? no  5) Do you have a log of your daily weights (if so, list)? no  6) Have you gained 3 pounds in a day or 5 pounds in a week? no  7) Have you traveled recently?   Patient calling, just concerned about the swelling recently in legs and feet.  Please call to discuss, try home number first, ok to leave a message, then try mobile.

## 2020-04-18 NOTE — Telephone Encounter (Signed)
Etiology unclear, could be side effect of calcium channel blocker/diltiazem in warmer weather, Unable to exclude fluid retention. Options include an office visit if sx get worse,  Could try lasix 20 mg 2-3 x a week with potassium  10 meqwith lasix Could try leg elevation/compression socks

## 2020-04-19 NOTE — Telephone Encounter (Signed)
Attempted to reach pt via phone, VM on cell phone is not set up, was able to leave a VM on home answering machine, advised if leg elevation and compression socks are not elevating the edema then may want to try a new prescription of lasix or can Dr. Mariah Milling recommends an in person visit to evaluate edema and medication regiment.

## 2020-04-21 ENCOUNTER — Telehealth: Payer: Self-pay | Admitting: Cardiovascular Disease

## 2020-04-21 NOTE — Progress Notes (Signed)
Cardiology Office Note    Date:  04/22/2020   ID:  Gary Dawson, Gary Dawson 10/31/1940, MRN [id number]  PCP:  Gracelyn Nurse, MD  Cardiologist:  Julien Nordmann, MD  Electrophysiologist:  None   Chief Complaint: Elevated BP  History of Present Illness:   Gary Dawson is a 80 y.o. male with history of permanent A. fib on Xarelto, CVA in 06/2008, HTN, HLD, OSA on CPAP, and BPH who presents for evaluation of elevated BP.  His history of A. fib dates back to approximately 2008.  He was admitted with a CVA involving the left posterior parietal lobe in 06/2008 with echo at that time showing EF greater than 55%, mildly dilated left atrium, mild LVH, mild to moderate TR, and mild to moderate mitral regurgitation.  Carotid artery ultrasound at that time showed no significant plaque.  Treadmill stress echo in 03/2012 showed no wall motion abnormalities concerning for ischemia with good exercise tolerance.  He was seen in 10/2014 for chest pain with subsequent nuclear stress test showing a medium defect of severe severity present in the mid inferoseptal, mid inferior, and apical inferior location consistent with prior MI and peri-infarct ischemia with an EF of 30 to 44%.  Overall, this was an intermediate risk study.  He was seen in follow-up later that month by primary cardiologist and was feeling better.  No further testing ordered.  He was last seen in the office in 11/2019 and noted to be in A. fib with RVR with ventricular 133 bpm and BP 130/80.  He had not yet taken his diltiazem the morning.  No changes were made.  More recently, in 03/2020 he fell 3 times in 1 day which he attributed to instability of his right ankle.  He was seen in the ED with head CT being nonacute.  Following this fall he continued to note dizziness, fatigue, and nausea.  He contacted our office in early 04/2020 noting some swelling in his feet and legs.  He reported his BP was well controlled at that time.  Primary  cardiologist recommended Lasix 20 mg 2-3 times per week with KCl as well as leg elevation and compression stockings.  Patient contacted our office on 4/6 noting a BP reading of 142/104 on 4/5 and in the setting appointment was scheduled for today.  He comes in today at the request of his PT to discuss elevated BP readings.  He reports his physical therapist told him his BP was elevated and he should have this checked out.  Due to fluctuating BP he had not been checking his BP recently.  Secondly, he has been experiencing a 2 to 3-week history of bilateral lower extremity swelling.  When he went to put his pants on this morning he realized they were tighter than they usually are.  He feels like his abdomen is a little swollen.  No worsening orthopnea, PND, or early satiety.  He did lose a fair amount of weight over the past year following the keto diet.  When compared to his last clinic visit in 11/2019, his weight has trended up 17 pounds with a trend from 176 to 193 pounds.  His weight has ranged was previously noted to be in the 200s in 2018.  He does watch his salt intake and is drinking less than 2 L of fluid per day.  He notes when he was recently trying to mow his lawn he had to stop and rest after approximately 20 minutes which was  new for him.  However, while he was mowing the lawn he felt very good.  No chest pain, palpitations, presyncope, or syncope.  No further falls.  No hematochezia or melena.   Labs independently reviewed: 03/2020 - potassium 4.1, BUN 31, serum creatinine 0.89, albumin 4.0, AST/ALT normal, Hgb 13.1, PLT 137 01/2020 - A1c 5.6 10/2019 - TC 123, TG 42, HDL 50, LDL 64, TSH normal  Past Medical History:  Diagnosis Date  . Atypical chest pain    a. 03/2012 St echo: nl EF, no wma's.  . CVA (cerebral vascular accident) (HCC)   . Depression   . Dysrhythmia   . Elevated PSA   . Essential hypertension   . Hyperlipidemia   . MVP (mitral valve prolapse)   . Persistent atrial  fibrillation (HCC)    a. CHA2DS2VASc = 4-->chronic Xarelto.  . Psoriasis   . Reflux   . Sleep apnea   . Vitamin B 12 deficiency     Past Surgical History:  Procedure Laterality Date  . COLONOSCOPY WITH PROPOFOL    . CYSTOSCOPY WITH LITHOLAPAXY N/A 11/01/2018   Procedure: CYSTOSCOPY WITH LITHOLAPAXY;  Surgeon: Sondra ComeSninsky, Brian C, MD;  Location: [facility] ORS;  Service: Urology;  Laterality: N/A;  . HERNIA REPAIR    . HOLEP-LASER ENUCLEATION OF THE PROSTATE WITH MORCELLATION N/A 11/01/2018   Procedure: HOLEP-LASER ENUCLEATION OF THE PROSTATE WITH MORCELLATION;  Surgeon: Sondra ComeSninsky, Brian C, MD;  Location: [facility] ORS;  Service: Urology;  Laterality: N/A;  . TONSILLECTOMY    . VARICOSE VEIN SURGERY      Current Medications: Current Meds  Medication Sig  . acetaminophen (TYLENOL) 325 MG tablet Take 650 mg by mouth every 6 (six) hours as needed for moderate pain.  Marland Kitchen. CYANOCOBALAMIN ER PO Take by mouth daily.  . furosemide (LASIX) 20 MG tablet Take 1 tablet (20 mg total) by mouth as directed. Take 2 tablets (40 mg) once daily for 3 days THEN take 1 tablet (20 mg) daily.  . hydroxypropyl methylcellulose / hypromellose (ISOPTO TEARS / GONIOVISC) 2.5 % ophthalmic solution Place 1 drop into both eyes 2 (two) times daily as needed for dry eyes.  . metoprolol tartrate (LOPRESSOR) 50 MG tablet Take 1 tablet (50 mg total) by mouth 2 (two) times daily.  . potassium chloride (KLOR-CON) 10 MEQ tablet Take 1 tablet (10 mEq total) by mouth daily.  . rivaroxaban (XARELTO) 20 MG TABS tablet TAKE 1 TABLET EVERY DAY WITH SUPPER  . sertraline (ZOLOFT) 25 MG tablet Take 25 mg by mouth daily.  Marland Kitchen. VITAMIN D, ERGOCALCIFEROL, PO Take 1,000 Int'l Units by mouth daily.  . [DISCONTINUED] diltiazem (CARDIZEM CD) 120 MG 24 hr capsule Take 1 capsule (120 mg total) by mouth daily.    Allergies:   Other and Lactase   Social History   Socioeconomic History  . Marital status: Divorced    Spouse name: Not on file  . Number of  children: Not on file  . Years of education: Not on file  . Highest education level: Not on file  Occupational History  . Not on file  Tobacco Use  . Smoking status: Never Smoker  . Smokeless tobacco: Never Used  Vaping Use  . Vaping Use: Never used  Substance and Sexual Activity  . Alcohol use: Yes    Alcohol/week: 1.0 standard drink    Types: 1 Glasses of wine per week    Comment: social   . Drug use: No  . Sexual activity: Not Currently  Other  Topics Concern  . Not on file  Social History Narrative  . Not on file   Social Determinants of Health   Financial Resource Strain: Not on file  Food Insecurity: Not on file  Transportation Needs: Not on file  Physical Activity: Not on file  Stress: Not on file  Social Connections: Not on file     Family History:  The patient's family history includes Heart attack in his father; Hyperlipidemia in his father; Hypertension in his father.  ROS:   Review of Systems  Constitutional: Positive for malaise/fatigue. Negative for chills, diaphoresis, fever and weight loss.  HENT: Negative for congestion.   Eyes: Negative for discharge and redness.  Respiratory: Negative for cough, sputum production, shortness of breath and wheezing.   Cardiovascular: Positive for leg swelling. Negative for chest pain, palpitations, orthopnea, claudication and PND.  Gastrointestinal: Negative for abdominal pain, blood in stool, heartburn, melena, nausea and vomiting.  Musculoskeletal: Positive for falls. Negative for myalgias.  Skin: Negative for rash.  Neurological: Positive for weakness. Negative for dizziness, tingling, tremors, sensory change, speech change, focal weakness and loss of consciousness.  Endo/Heme/Allergies: Does not bruise/bleed easily.  Psychiatric/Behavioral: Negative for substance abuse. The patient is not nervous/anxious.   All other systems reviewed and are negative.    EKGs/Labs/Other Studies Reviewed:    Studies reviewed  were summarized above. The additional studies were reviewed today:  Stress echo 03/2012 [facility]): Normal treadmill EKG without evidence of ischemia or dysrhythmia, excellent exercise tolerance for age, normal stress echocardiographic images without evidence of ischemia, mild TR, mild MR. __________  Nuclear stress test 10/2014:  There was no ST segment deviation noted during stress.  Defect 1: There is a medium defect of severe severity present in the mid inferoseptal, mid inferior and apical inferior location.  Findings consistent with prior myocardial infarction with peri-infarct ischemia.  This is an intermediate risk study.  The left ventricular ejection fraction is moderately decreased (30-44%).    EKG:  EKG is ordered today.  The EKG ordered today demonstrates A. fib with RVR, 107 bpm, rare PVC, LVH, nonspecific ST-T changes  Recent Labs: 03/19/2020: ALT 35; BUN 31; Creatinine, Ser 0.89; Hemoglobin 13.1; Platelets 137; Potassium 4.1; Sodium 141  Recent Lipid Panel No results found for: CHOL, TRIG, HDL, CHOLHDL, VLDL, LDLCALC, LDLDIRECT  PHYSICAL EXAM:    VS:  BP (!) 154/90 (BP Location: Left Arm, Patient Position: Sitting, Cuff Size: Large)   Pulse (!) 107   Ht 6\' 1"  (1.854 m)   Wt 193 lb (87.5 kg)   SpO2 98%   BMI 25.46 kg/m   BMI: Body mass index is 25.46 kg/m.  Physical Exam Vitals reviewed.  Constitutional:      Appearance: He is well-developed.  HENT:     Head: Normocephalic and atraumatic.  Eyes:     General:        Right eye: No discharge.        Left eye: No discharge.  Neck:     Vascular: JVD present.     Comments: JVD elevated to the angle of the mandible Cardiovascular:     Rate and Rhythm: Normal rate. Rhythm irregularly irregular.     Pulses: No midsystolic click and no opening snap.          Posterior tibial pulses are 2+ on the right side and 2+ on the left side.     Heart sounds: Normal heart sounds, S1 normal and S2 normal. Heart  sounds not distant.  No murmur heard. No friction rub.  Pulmonary:     Effort: Pulmonary effort is normal. No respiratory distress.     Breath sounds: Examination of the right-lower field reveals decreased breath sounds. Examination of the left-lower field reveals decreased breath sounds. Decreased breath sounds present. No wheezing or rales.  Chest:     Chest wall: No tenderness.  Abdominal:     General: There is distension.     Palpations: Abdomen is soft.     Tenderness: There is no abdominal tenderness.  Musculoskeletal:     Cervical back: Normal range of motion.     Right lower leg: Edema present.     Left lower leg: Edema present.     Comments: 2+ bilateral lower extremity pitting edema to the mid thighs  Skin:    General: Skin is warm and dry.     Nails: There is no clubbing.  Neurological:     Mental Status: He is alert and oriented to person, place, and time.  Psychiatric:        Speech: Speech normal.        Behavior: Behavior normal.        Thought Content: Thought content normal.        Judgment: Judgment normal.     Wt Readings from Last 3 Encounters:  04/22/20 193 lb (87.5 kg)  04/13/20 195 lb (88.5 kg)  03/19/20 180 lb (81.6 kg)     ASSESSMENT & PLAN:   1. Volume overload with possible cardiomyopathy: He presents with a 2 to 3-week history of volume overload.  Prior Lexiscan MPI in 2016 demonstrated an EF of 30 to 40%.  It remains unclear if this was falsely reduced.  Some of his lower extremity swelling could be exacerbated by calcium channel blocker usage.  In this setting we have transitioned him from diltiazem to metoprolol as outlined below.  He is volume up and therefore we have started Lasix 40 mg daily for 3 days followed by 20 mg daily thereafter.  With this he will start KCl 10 mEq daily.  Recent renal function normal as outlined above.  When he is seen in follow-up next week we will need to repeat a BMP.  Check echo.  If he is found to have a  cardiomyopathy, following outpatient diuresis he will need a diagnostic R/LHC.  2. HTN: Blood pressure is on the higher side, possibly in the setting of volume overload.  Transition from diltiazem to metoprolol with diuresis as outlined above.  Follow-up next week.  3. Permanent A. Fib: Ventricular rates are mildly elevated, possibly in the setting of volume overload.  Transition from diltiazem to metoprolol given lower extremity swelling and potential cardiomyopathy.  This may offer added ventricular rate control as well.  Escalate rate control strategy as indicated.  Given his CHA2DS2-VASc of at least 5 (HTN, age x2, CVA x2), he remains on Xarelto 20 mg q. dinner with a creatinine clearance of 83.8.  4. HLD: LDL 64 in 10/2019.  Not currently on statin therapy.  5. History of CVA: No new deficits.  On OAC as outlined above.  6. OSA: CPAP.  This was not discussed in detail at today's visit given volume overload status.  Disposition: F/u with Dr. Mariah Milling or an APP in 1 week.   Medication Adjustments/Labs and Tests Ordered: Current medicines are reviewed at length with the patient today.  Concerns regarding medicines are outlined above. Medication changes, Labs and Tests ordered today are summarized above and  listed in the Patient Instructions accessible in Encounters.   Signed, Eula Listen, PA-C 04/22/2020 3:46 PM     [facility] [address] [place], [place] [address] [phone]

## 2020-04-21 NOTE — Telephone Encounter (Signed)
Pt c/o BP issue: STAT if pt c/o blurred vision, one-sided weakness or slurred speech  1. What are your last 5 BP readings?  Yesterday 142/104 No other readings availble  2. Are you having any other symptoms (ex. Dizziness, headache, blurred vision, passed out)? In the last month states he has experienced some nausea and somewhat disoriented, but not today  3. What is your BP issue? elevated  Patient is seeing Eula Listen, PA tomorrow 4/7  Patient states if he is unreachable on his cell phone, try his land line

## 2020-04-22 ENCOUNTER — Other Ambulatory Visit: Payer: Self-pay

## 2020-04-22 ENCOUNTER — Ambulatory Visit: Payer: Medicare PPO | Admitting: Physician Assistant

## 2020-04-22 ENCOUNTER — Encounter: Payer: Self-pay | Admitting: Physician Assistant

## 2020-04-22 VITALS — BP 154/90 | HR 107 | Ht 73.0 in | Wt 193.0 lb

## 2020-04-22 DIAGNOSIS — Z9989 Dependence on other enabling machines and devices: Secondary | ICD-10-CM

## 2020-04-22 DIAGNOSIS — E8779 Other fluid overload: Secondary | ICD-10-CM | POA: Diagnosis not present

## 2020-04-22 DIAGNOSIS — I4821 Permanent atrial fibrillation: Secondary | ICD-10-CM

## 2020-04-22 DIAGNOSIS — I10 Essential (primary) hypertension: Secondary | ICD-10-CM

## 2020-04-22 DIAGNOSIS — I429 Cardiomyopathy, unspecified: Secondary | ICD-10-CM

## 2020-04-22 DIAGNOSIS — E785 Hyperlipidemia, unspecified: Secondary | ICD-10-CM

## 2020-04-22 DIAGNOSIS — Z8673 Personal history of transient ischemic attack (TIA), and cerebral infarction without residual deficits: Secondary | ICD-10-CM

## 2020-04-22 DIAGNOSIS — G4733 Obstructive sleep apnea (adult) (pediatric): Secondary | ICD-10-CM

## 2020-04-22 MED ORDER — METOPROLOL TARTRATE 50 MG PO TABS: 50.0000 mg | ORAL_TABLET | Freq: Two times a day (BID) | ORAL | 3 refills | Status: DC

## 2020-04-22 MED ORDER — POTASSIUM CHLORIDE ER 10 MEQ PO TBCR: 10.0000 meq | EXTENDED_RELEASE_TABLET | Freq: Every day | ORAL | 3 refills | Status: DC

## 2020-04-22 MED ORDER — FUROSEMIDE 20 MG PO TABS: 20.0000 mg | ORAL_TABLET | ORAL | 0 refills | Status: DC

## 2020-04-22 NOTE — Telephone Encounter (Signed)
Patient scheduled to be seen today. 

## 2020-04-22 NOTE — Patient Instructions (Addendum)
Medication Instructions:  Your physician has recommended you make the following change in your medication:   1. STOP Diltiazem 2. START Metoprolol Tartrate 50 mg twice a day 3. START Furosemide 20 mg take 2 tablets (40 mg) once daily for 3 days THEN decrease down to 1 tablet daily 4. START Potassium 10 mEq once daily   *If you need a refill on your cardiac medications before your next appointment, please call your pharmacy*   Lab Work: None  If you have labs (blood work) drawn today and your tests are completely normal, you will receive your results only by: Marland Kitchen MyChart Message (if you have MyChart) OR . A paper copy in the mail If you have any lab test that is abnormal or we need to change your treatment, we will call you to review the results.   Testing/Procedures: Your physician has requested that you have an echocardiogram. Echocardiography is a painless test that uses sound waves to create images of your heart. It provides your doctor with information about the size and shape of your heart and how well your heart's chambers and valves are working. This procedure takes approximately one hour. There are no restrictions for this procedure.     Follow-Up: At [facility], you and your health needs are our priority.  As part of our continuing mission to provide you with exceptional heart care, we have created designated Provider Care Teams.  These Care Teams include your primary Cardiologist (physician) and Advanced Practice Providers (APPs -  Physician Assistants and Nurse Practitioners) who all work together to provide you with the care you need, when you need it.  Your next appointment:   Wednesday 04/28/20 at 1:30 pm   The format for your next appointment:   In Person  Provider:   Eula Listen, PA-C

## 2020-04-26 NOTE — Addendum Note (Signed)
Addended by: Ileene Musa D on: 04/26/2020 04:30 PM   Modules accepted: Orders

## 2020-04-27 NOTE — Progress Notes (Signed)
Cardiology Office Note    Date:  04/28/2020   ID:  Corin, Tilly 02-26-1940, MRN [id number]  PCP:  Gracelyn Nurse, MD  Cardiologist:  Julien Nordmann, MD  Electrophysiologist:  None   Chief Complaint: Follow up  History of Present Illness:   Gary Dawson is a 80 y.o. male with history of permanent A. fib on Xarelto, CVA in 06/2008, HTN, HLD, OSA on CPAP, and BPH who presents for follow up of volume overload.  His history of A. fib dates back to approximately 2008.  He was admitted with a CVA involving the left posterior parietal lobe in 06/2008 with echo at that time showing EF greater than 55%, mildly dilated left atrium, mild LVH, mild to moderate TR, and mild to moderate mitral regurgitation.  Carotid artery ultrasound showed no significant plaque.  Treadmill stress echo in 03/2012 showed no wall motion abnormalities concerning for ischemia with good exercise tolerance.  He was seen in 10/2014 for chest pain with subsequent nuclear stress test showing a medium defect of severe severity present in the mid inferoseptal, mid inferior, and apical inferior location consistent with prior MI and peri-infarct ischemia with an EF of 30 to 44%.  Overall, this was an intermediate risk study.  He was seen in follow-up later that month by primary cardiologist and was feeling better.  No further testing ordered.  He was seen in the office in 11/2019 and noted to be in A. fib with RVR with ventricular 133 bpm and BP 130/80.  He had not yet taken his diltiazem that morning.  No changes were made.  More recently, in 03/2020 he fell 3 times in 1 day which he attributed to instability of his right ankle.  He was seen in the ED with head CT being nonacute.  Following this fall he continued to note dizziness, fatigue, and nausea.  He contacted our office in early 04/2020 noting some swelling in his feet and legs.  He reported his BP was well controlled at that time.  His primary  cardiologist recommended Lasix 20 mg 2-3 times per week with KCl as well as leg elevation and compression stockings.  Patient contacted our office on 4/6 noting a BP reading of 142/104 on 4/5 and in the setting appointment was scheduled.  He was seen in the office on 04/22/2020 at the request of his physical therapist to discuss elevated BP reading.  At that time, he was noted to be volume overloaded with lower extremity edema, abdominal distention, and a weight trend that was up 17 pounds when compared to his visit in 11/2019.  It remained uncertain if he truly had a cardiomyopathy, as MPI in 2016 showed an EF of 30 to 40%, and it was uncertain if this was falsely reduced.  We could not exclude some of his lower extremity swelling being exacerbated by calcium channel blocker usage.  In this setting, he was started on Lasix 40 mg daily for 3 days followed by 20 mg daily thereafter.  His diltiazem was discontinued and he was transitioned to metoprolol.  Echo was ordered and is pending at this time.  He comes in today noting significant improvement in his dyspnea, lower extremity swelling, dizziness, appetite, and overall functional status.  No chest pain, palpitations, presyncope, or syncope.  No further falls.  No hematochezia, melena, hemoptysis, hematemesis, or hematuria.  Currently, he is taking Lasix 20 mg daily, KCl 10 mEq daily, and metoprolol tartrate 50 mg daily rather than  twice daily.  He is watching his salt intake and drinking less than 2 L of fluids per day.  As outlined above, he has improved lower extremity swelling, abdominal distention, and decreased early satiety.  No orthopnea or PND.  He continues to work with physical therapy regarding his right ankle with noted significant improvement in this as well.  He does ambulate with a cane.  In the office today, his weight is down 7 pounds when compared to his weight on 4/7 with a trend of 193 to 186 pounds today.  His BP is improved at 132/72.  His  ventricular rates are also improved with a current heart rate of 83 bpm, in permanent A. fib.  His echo is scheduled for 5/3.  He will be working with a nutritionist starting later this month.   Labs independently reviewed: 03/2020 - potassium 4.1, BUN 31, serum creatinine 0.89, albumin 4.0, AST/ALT normal, Hgb 13.1, PLT 137 01/2020 - A1c 5.6 10/2019 - TC 123, TG 42, HDL 50, LDL 64, TSH normal  Past Medical History:  Diagnosis Date  . Atypical chest pain    a. 03/2012 St echo: nl EF, no wma's.  . CVA (cerebral vascular accident) (HCC)   . Depression   . Dysrhythmia   . Elevated PSA   . Essential hypertension   . Hyperlipidemia   . MVP (mitral valve prolapse)   . Persistent atrial fibrillation (HCC)    a. CHA2DS2VASc = 4-->chronic Xarelto.  . Psoriasis   . Reflux   . Sleep apnea   . Vitamin B 12 deficiency     Past Surgical History:  Procedure Laterality Date  . COLONOSCOPY WITH PROPOFOL    . CYSTOSCOPY WITH LITHOLAPAXY N/A 11/01/2018   Procedure: CYSTOSCOPY WITH LITHOLAPAXY;  Surgeon: Sondra ComeSninsky, Brian C, MD;  Location: [facility] ORS;  Service: Urology;  Laterality: N/A;  . HERNIA REPAIR    . HOLEP-LASER ENUCLEATION OF THE PROSTATE WITH MORCELLATION N/A 11/01/2018   Procedure: HOLEP-LASER ENUCLEATION OF THE PROSTATE WITH MORCELLATION;  Surgeon: Sondra ComeSninsky, Brian C, MD;  Location: [facility] ORS;  Service: Urology;  Laterality: N/A;  . TONSILLECTOMY    . VARICOSE VEIN SURGERY      Current Medications: Current Meds  Medication Sig  . acetaminophen (TYLENOL) 325 MG tablet Take 650 mg by mouth every 6 (six) hours as needed for moderate pain.  Marland Kitchen. CYANOCOBALAMIN ER PO Take by mouth daily.  . furosemide (LASIX) 20 MG tablet Take 20 mg by mouth daily.  . hydroxypropyl methylcellulose / hypromellose (ISOPTO TEARS / GONIOVISC) 2.5 % ophthalmic solution Place 1 drop into both eyes 2 (two) times daily as needed for dry eyes.  . potassium chloride (KLOR-CON) 10 MEQ tablet Take 1 tablet (10 mEq total) by  mouth daily.  . rivaroxaban (XARELTO) 20 MG TABS tablet TAKE 1 TABLET EVERY DAY WITH SUPPER  . sertraline (ZOLOFT) 25 MG tablet Take 25 mg by mouth daily.  Marland Kitchen. VITAMIN D, ERGOCALCIFEROL, PO Take 1,000 Int'l Units by mouth daily.  . [DISCONTINUED] metoprolol tartrate (LOPRESSOR) 50 MG tablet Take 50 mg by mouth daily.    Allergies:   Other and Lactase   Social History   Socioeconomic History  . Marital status: Divorced    Spouse name: Not on file  . Number of children: Not on file  . Years of education: Not on file  . Highest education level: Not on file  Occupational History  . Not on file  Tobacco Use  . Smoking status: Never Smoker  .  Smokeless tobacco: Never Used  Vaping Use  . Vaping Use: Never used  Substance and Sexual Activity  . Alcohol use: Yes    Alcohol/week: 1.0 standard drink    Types: 1 Glasses of wine per week    Comment: social   . Drug use: No  . Sexual activity: Not Currently  Other Topics Concern  . Not on file  Social History Narrative  . Not on file   Social Determinants of Health   Financial Resource Strain: Not on file  Food Insecurity: Not on file  Transportation Needs: Not on file  Physical Activity: Not on file  Stress: Not on file  Social Connections: Not on file     Family History:  The patient's family history includes Heart attack in his father; Hyperlipidemia in his father; Hypertension in his father.  ROS:   Review of Systems  Constitutional: Positive for malaise/fatigue. Negative for chills, diaphoresis, fever and weight loss.  HENT: Negative for congestion.   Eyes: Negative for discharge and redness.  Respiratory: Positive for shortness of breath. Negative for cough, hemoptysis, sputum production and wheezing.        Improved dyspnea  Cardiovascular: Positive for leg swelling. Negative for chest pain, palpitations, orthopnea, claudication and PND.       Improving lower extremity edema  Gastrointestinal: Negative for abdominal  pain, blood in stool, heartburn, melena, nausea and vomiting.  Genitourinary: Negative for hematuria.  Musculoskeletal: Positive for joint pain. Negative for falls and myalgias.       Right ankle pain  Skin: Negative for rash.  Neurological: Positive for dizziness. Negative for tingling, tremors, sensory change, speech change, focal weakness, loss of consciousness and weakness.       Improving dizziness  Endo/Heme/Allergies: Does not bruise/bleed easily.  Psychiatric/Behavioral: Negative for substance abuse. The patient is not nervous/anxious.   All other systems reviewed and are negative.    EKGs/Labs/Other Studies Reviewed:    Studies reviewed were summarized above. The additional studies were reviewed today:  Stress echo 03/2012 [facility]): Normal treadmill EKG without evidence of ischemia or dysrhythmia, excellent exercise tolerance for age, normal stress echocardiographic images without evidence of ischemia, mild TR, mild MR. __________  Nuclear stress test 10/2014:  There was no ST segment deviation noted during stress.  Defect 1: There is a medium defect of severe severity present in the mid inferoseptal, mid inferior and apical inferior location.  Findings consistent with prior myocardial infarction with peri-infarct ischemia.  This is an intermediate risk study.  The left ventricular ejection fraction is moderately decreased (30-44%).    EKG:  EKG is not ordered today.  Not indicated with improved symptoms/ventricular rate and with known permanent A. fib.  Recent Labs: 03/19/2020: ALT 35; BUN 31; Creatinine, Ser 0.89; Hemoglobin 13.1; Platelets 137; Potassium 4.1; Sodium 141  Recent Lipid Panel No results found for: CHOL, TRIG, HDL, CHOLHDL, VLDL, LDLCALC, LDLDIRECT  PHYSICAL EXAM:    VS:  BP 132/72 (BP Location: Left Arm, Patient Position: Sitting, Cuff Size: Large)   Pulse 83   Ht 6\' 1"  (1.854 m)   Wt 186 lb (84.4 kg)   SpO2 98%   BMI 24.54 kg/m    BMI: Body mass index is 24.54 kg/m.  Physical Exam Vitals reviewed.  Constitutional:      Appearance: He is well-developed.  HENT:     Head: Normocephalic and atraumatic.  Eyes:     General:        Right eye: No discharge.  Left eye: No discharge.  Neck:     Vascular: No JVD.  Cardiovascular:     Rate and Rhythm: Normal rate. Rhythm irregularly irregular.     Pulses: No midsystolic click and no opening snap.          Posterior tibial pulses are 2+ on the right side and 2+ on the left side.     Heart sounds: Normal heart sounds, S1 normal and S2 normal. Heart sounds not distant. No murmur heard. No friction rub.  Pulmonary:     Effort: Pulmonary effort is normal. No respiratory distress.     Breath sounds: Normal breath sounds. No decreased breath sounds, wheezing or rales.     Comments: Much improved breath sounds, that are now clear to auscultation bilaterally Chest:     Chest wall: No tenderness.  Abdominal:     General: There is no distension.     Palpations: Abdomen is soft.     Tenderness: There is no abdominal tenderness.  Musculoskeletal:     Cervical back: Normal range of motion.     Right lower leg: Edema present.     Left lower leg: Edema present.     Comments: Significantly improved bilateral lower extremity edema that is now just 1+ and extends to the mid shins (previously to the thighs)  Skin:    General: Skin is warm and dry.     Nails: There is no clubbing.  Neurological:     Mental Status: He is alert and oriented to person, place, and time.  Psychiatric:        Speech: Speech normal.        Behavior: Behavior normal.        Thought Content: Thought content normal.        Judgment: Judgment normal.     Wt Readings from Last 3 Encounters:  04/28/20 186 lb (84.4 kg)  04/22/20 193 lb (87.5 kg)  04/13/20 195 lb (88.5 kg)     ReDs vest 34% (range 20-35%)  ASSESSMENT & PLAN:   1. Volume overload with possible cardiomyopathy: Since he was last  seen on 4/7, his volume status is significantly improved.  As outlined in my note on 4/7, prior Lexiscan MPI in 2016 demonstrated an EF of 30 to 40%.  It remains unclear if this EF was a falsely reduced based on MPI.  He is scheduled for an echo on 5/3.  If he is found to have a cardiomyopathy by echo, we will plan to proceed with a diagnostic R/LHC at that time following outpatient diuresis.  Escalate GDMT as indicated based on echo results.  Lower extremity swelling is significantly improved off calcium channel blocker and with diuresis.  For now, he will continue Lasix 20 mg daily along with KCl 10 mEq daily.  Check BMP.  CHF education discussed in detail.  With improved volume status is early satiety has also improved.  He will be establishing with a nutritionist later this month.  This in turn will help assist with his volume management.  2. HTN: Blood pressure is improved following diuresis and transition from diltiazem to metoprolol.  He has only been taking metoprolol tartrate once daily rather than the recommended twice daily.  He has been advised to start taking this 50 mg twice daily.  He will continue furosemide as outlined above.  Low-sodium diet was again recommended.  3. Permanent Afib: Ventricular rates are improved and now normal following diuresis and transition from diltiazem to metoprolol.  We will continue metoprolol tartrate 50 mg twice daily for rate control.  As outlined above he has been advised to start taking this medication twice daily as he has only been taking it once daily since we last saw him.  Given his CHADS2VASc at least 5 (HTN, age x2, CVA x2) he remains on Xarelto with most recent creatinine clearance 83.8.  We will check a BMP as outlined above.  Recent CBC showing a normal hemoglobin.  No further falls.  Agree with him using a cane to assist with ambulation.  Moving forward, would avoid calcium channel blocker usage.  4. HLD: LDL 64 in 10/2019.  Not currently requiring a  statin.  5. History of CVA: No new deficits.  He remains on [facility] as outlined above.  6. OSA: Continue CPAP.  Disposition: F/u with Dr. Mariah Milling or an APP in 2 weeks.   Medication Adjustments/Labs and Tests Ordered: Current medicines are reviewed at length with the patient today.  Concerns regarding medicines are outlined above. Medication changes, Labs and Tests ordered today are summarized above and listed in the Patient Instructions accessible in Encounters.   Signed, Eula Listen, PA-C 04/28/2020 3:08 PM     [facility] [address] [place], [place] [address] [phone]

## 2020-04-28 ENCOUNTER — Other Ambulatory Visit: Payer: Self-pay

## 2020-04-28 ENCOUNTER — Other Ambulatory Visit: Payer: Self-pay | Admitting: Physician Assistant

## 2020-04-28 ENCOUNTER — Encounter: Payer: Self-pay | Admitting: Physician Assistant

## 2020-04-28 ENCOUNTER — Ambulatory Visit (INDEPENDENT_AMBULATORY_CARE_PROVIDER_SITE_OTHER): Payer: Medicare PPO | Admitting: Physician Assistant

## 2020-04-28 VITALS — BP 132/72 | HR 83 | Ht 73.0 in | Wt 186.0 lb

## 2020-04-28 DIAGNOSIS — I10 Essential (primary) hypertension: Secondary | ICD-10-CM | POA: Diagnosis not present

## 2020-04-28 DIAGNOSIS — I429 Cardiomyopathy, unspecified: Secondary | ICD-10-CM | POA: Diagnosis not present

## 2020-04-28 DIAGNOSIS — Z79899 Other long term (current) drug therapy: Secondary | ICD-10-CM

## 2020-04-28 DIAGNOSIS — Z9989 Dependence on other enabling machines and devices: Secondary | ICD-10-CM

## 2020-04-28 DIAGNOSIS — I4821 Permanent atrial fibrillation: Secondary | ICD-10-CM | POA: Diagnosis not present

## 2020-04-28 DIAGNOSIS — M7989 Other specified soft tissue disorders: Secondary | ICD-10-CM

## 2020-04-28 DIAGNOSIS — G4733 Obstructive sleep apnea (adult) (pediatric): Secondary | ICD-10-CM

## 2020-04-28 DIAGNOSIS — E785 Hyperlipidemia, unspecified: Secondary | ICD-10-CM

## 2020-04-28 DIAGNOSIS — Z8673 Personal history of transient ischemic attack (TIA), and cerebral infarction without residual deficits: Secondary | ICD-10-CM

## 2020-04-28 MED ORDER — METOPROLOL TARTRATE 50 MG PO TABS: 50.0000 mg | ORAL_TABLET | Freq: Two times a day (BID) | ORAL | 1 refills | Status: DC

## 2020-04-28 NOTE — Telephone Encounter (Signed)
metoprolol tartrate (LOPRESSOR) 50 MG tablet 60 tablet 1 04/28/2020 06/27/2020   Sig - Route: Take 1 tablet (50 mg total) by mouth 2 (two) times daily. - Oral   Sent to pharmacy as: metoprolol tartrate (LOPRESSOR) 50 MG tablet   E-Prescribing Status: Receipt confirmed by pharmacy (04/28/2020  2:44 PM EDT)

## 2020-04-28 NOTE — Patient Instructions (Signed)
Medication Instructions:  Your physician has recommended you make the following change in your medication:   CHANGE Metoprolol Tartrate 50mg  take TWICE daily   *If you need a refill on your cardiac medications before your next appointment, please call your pharmacy*   Lab Work: Your physician recommends that you have lab work TODAY: BMET  If you have labs (blood work) drawn today and your tests are completely normal, you will receive your results only by: MyChart Message (if you have MyChart) OR . A paper copy in the mail If you have any lab test that is abnormal or we need to change your treatment, we will call you to review the results.   Testing/Procedures:  Keep your appointment for upcoming echocardiogram.   Follow-Up: At [facility], you and your health needs are our priority.  As part of our continuing mission to provide you with exceptional heart care, we have created designated Provider Care Teams.  These Care Teams include your primary Cardiologist (physician) and Advanced Practice Providers (APPs -  Physician Assistants and Nurse Practitioners) who all work together to provide you with the care you need, when you need it.  We recommend signing up for the patient portal called "MyChart".  Sign up information is provided on this After Visit Summary.  MyChart is used to connect with patients for Virtual Visits (Telemedicine).  Patients are able to view lab/test results, encounter notes, upcoming appointments, etc.  Non-urgent messages can be sent to your provider as well.   To learn more about what you can do with MyChart, go to [facility].    Your next appointment:   2 week(s)  The format for your next appointment:   In Person  Provider:   You may see [email or web address], MD or one of the following Advanced Practice Providers on your designated Care Team:    Julien Nordmann, NP  Nicolasa Ducking, PA-C  Eula Listen, PA-C  Cadence Pearsall, [place]  [place], NP

## 2020-04-29 LAB — BASIC METABOLIC PANEL
BUN/Creatinine Ratio: 27 — ABNORMAL HIGH (ref 10–24)
BUN: 27 mg/dL (ref 8–27)
CO2: 23 mmol/L (ref 20–29)
Calcium: 9.1 mg/dL (ref 8.6–10.2)
Chloride: 102 mmol/L (ref 96–106)
Creatinine, Ser: 1 mg/dL (ref 0.76–1.27)
Glucose: 133 mg/dL — ABNORMAL HIGH (ref 65–99)
Potassium: 4.2 mmol/L (ref 3.5–5.2)
Sodium: 143 mmol/L (ref 134–144)
eGFR: 77 mL/min/{1.73_m2} (ref >59)

## 2020-05-04 ENCOUNTER — Other Ambulatory Visit: Payer: Self-pay | Admitting: Physician Assistant

## 2020-05-04 ENCOUNTER — Telehealth: Payer: Self-pay | Admitting: *Deleted

## 2020-05-04 NOTE — Telephone Encounter (Signed)
No answer. Line busy.  

## 2020-05-04 NOTE — Telephone Encounter (Signed)
Reviewed results with patient and recommendations. He verbalized understanding of results, recommendations, and confirmed upcoming appointment. No further questions at this time.

## 2020-05-04 NOTE — Telephone Encounter (Signed)
-----   Message from Sondra Barges, PA-C sent at 04/30/2020  7:26 AM EDT ----- Random glucose is mildly elevated. Renal function is normal. Potassium is at goal.  Recommendations: -Continue Lasix 20 mg daily and KCl 10 mEq daily -Follow-up as previously scheduled

## 2020-05-10 ENCOUNTER — Other Ambulatory Visit: Payer: Self-pay | Admitting: *Deleted

## 2020-05-10 DIAGNOSIS — N138 Other obstructive and reflux uropathy: Secondary | ICD-10-CM

## 2020-05-10 DIAGNOSIS — N401 Enlarged prostate with lower urinary tract symptoms: Secondary | ICD-10-CM

## 2020-05-11 ENCOUNTER — Ambulatory Visit: Payer: Medicare PPO | Admitting: Urology

## 2020-05-11 ENCOUNTER — Other Ambulatory Visit: Payer: Self-pay

## 2020-05-11 ENCOUNTER — Encounter: Payer: Self-pay | Admitting: Urology

## 2020-05-11 ENCOUNTER — Other Ambulatory Visit
Admission: RE | Admit: 2020-05-11 | Discharge: 2020-05-11 | Disposition: A | Discharge status: Home / Self Care | Payer: Medicare PPO | Attending: Urology | Admitting: Urology

## 2020-05-11 VITALS — BP 129/80 | HR 73 | Ht 73.0 in | Wt 183.0 lb

## 2020-05-11 DIAGNOSIS — R351 Nocturia: Secondary | ICD-10-CM | POA: Diagnosis not present

## 2020-05-11 DIAGNOSIS — N3281 Overactive bladder: Secondary | ICD-10-CM | POA: Insufficient documentation

## 2020-05-11 DIAGNOSIS — N401 Enlarged prostate with lower urinary tract symptoms: Secondary | ICD-10-CM | POA: Insufficient documentation

## 2020-05-11 DIAGNOSIS — N138 Other obstructive and reflux uropathy: Secondary | ICD-10-CM

## 2020-05-11 LAB — URINALYSIS, COMPLETE (UACMP) WITH MICROSCOPIC
Bilirubin Urine: NEGATIVE
Glucose, UA: NEGATIVE mg/dL
Hgb urine dipstick: NEGATIVE
Ketones, ur: NEGATIVE mg/dL
Leukocytes,Ua: NEGATIVE
Nitrite: NEGATIVE
Protein, ur: NEGATIVE mg/dL
Specific Gravity, Urine: 1.02 (ref 1.005–1.030)
pH: 6 (ref 5.0–8.0)

## 2020-05-11 LAB — BLADDER SCAN AMB NON-IMAGING

## 2020-05-11 NOTE — Patient Instructions (Signed)
Take your Lasix(furosemide) in the morning, this will significantly improve your overnight leakage and overnight urination.  Continue Myrbetriq for another month to see if this helps with some of your overactive symptoms  I would strongly encourage you to consider using your CPAP machine overnight, as this can significantly decrease the amount you are urinating overnight, as well as even the leakage.  Follow-up in 1 month for symptom check

## 2020-05-11 NOTE — Progress Notes (Signed)
   05/11/2020 1:34 PM   Gary Dawson [name] [birth year] [id number]  Reason for visit: Follow up BPH/OAB, nocturia  HPI: I saw Gary Dawson in urology clinic today for worsening urinary symptoms.  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  He had bothersome overactive symptoms as expected for the first few months postoperatively, but at our visit in August 2021 really was doing quite well.  He was not wearing a pad or having any leakage at that time, and had stopped drinking green and flavored teas and switched over to water and his urinary symptoms had improved significantly.  He also has a fair amount of GI symptoms with gas and constipation as well that he tends to fixate on, and he has an upcoming appointment with a nutritionist to discuss further options.  He does think that his urinary and bowel symptoms are tied together.  His primary issue today seems to be leakage of urine overnight requiring a depends that he does not realize, as well as some urgency and rare urge incontinence during the day.  We started Myrbetriq at our last visit 1 month ago, which she does think has helped his urinary symptoms.  He has also been taking his Lasix at night before bed, and I stressed at length the importance of trying to take this in the morning or early afternoon to improve some of his nocturia.  He remains noncompliant with CPAP for sleep apnea, and I again reiterated the correlation between sleep apnea and nocturia.  Urinalysis today relatively benign with 0-5 RBCs, few bacteria, 6-10 WBCs, 0-5 squamous cells, negative leukocytes, nitrite negative.  PVR normal at 34 mL.  -Continue Myrbetriq 50 mg daily, samples again given.  Okay to send prescription if he would like to continue this medication. -Take Lasix in  the morning or early afternoon as opposed to before bed -Again encouraged CPAP compliance -RTC 6 weeks symptom check, consider cystoscopy if no improvement with above strategies  [name], MD  [facility] [address], [address] [place], [place] [address] [phone]

## 2020-05-13 LAB — URINE CULTURE: Culture: <10000 — AB

## 2020-05-17 ENCOUNTER — Other Ambulatory Visit: Payer: Self-pay

## 2020-05-17 ENCOUNTER — Ambulatory Visit: Payer: Medicare PPO | Admitting: Physician Assistant

## 2020-05-17 ENCOUNTER — Encounter: Payer: Self-pay | Admitting: Physician Assistant

## 2020-05-17 VITALS — BP 120/64 | HR 100 | Ht 73.0 in | Wt 186.0 lb

## 2020-05-17 DIAGNOSIS — E785 Hyperlipidemia, unspecified: Secondary | ICD-10-CM

## 2020-05-17 DIAGNOSIS — I4821 Permanent atrial fibrillation: Secondary | ICD-10-CM | POA: Diagnosis not present

## 2020-05-17 DIAGNOSIS — I10 Essential (primary) hypertension: Secondary | ICD-10-CM

## 2020-05-17 DIAGNOSIS — I429 Cardiomyopathy, unspecified: Secondary | ICD-10-CM

## 2020-05-17 DIAGNOSIS — R413 Other amnesia: Secondary | ICD-10-CM

## 2020-05-17 DIAGNOSIS — M7989 Other specified soft tissue disorders: Secondary | ICD-10-CM | POA: Diagnosis not present

## 2020-05-17 DIAGNOSIS — Z9989 Dependence on other enabling machines and devices: Secondary | ICD-10-CM

## 2020-05-17 DIAGNOSIS — Z8673 Personal history of transient ischemic attack (TIA), and cerebral infarction without residual deficits: Secondary | ICD-10-CM

## 2020-05-17 DIAGNOSIS — G4733 Obstructive sleep apnea (adult) (pediatric): Secondary | ICD-10-CM

## 2020-05-17 LAB — MISC LABCORP TEST (SEND OUT): Labcorp test code: 86884

## 2020-05-17 NOTE — Progress Notes (Signed)
Cardiology Office Note    Date:  05/17/2020   ID:  Bjorn Losereter Anthony Tiller, DOB 08/09/1940, MRN [id number]  PCP:  Gracelyn NurseJohnston, John D, MD  Cardiologist:  Julien Nordmannimothy Gollan, MD  Electrophysiologist:  None   Chief Complaint: Follow-up  History of Present Illness:   Gary Dawson is a 80 y.o. male with history of permanent A. fib on Xarelto, CVA in 06/2008, HTN, HLD, OSA on CPAP, and BPH who presents for follow up of volume overload.  His history of A. fib dates back to approximately 2008. He was admitted with a CVA involving the left posterior parietal lobe in 06/2008 with echo at that time showing EF greater than 55%, mildly dilated left atrium, mild LVH, mild to moderate TR, and mild to moderate mitral regurgitation. Carotid artery ultrasound showed no significant plaque. Treadmill stress echo in 03/2012 showed no wall motion abnormalities concerning for ischemia with good exercise tolerance.  He was seen in 10/2014 for chest pain with subsequent nuclear stress test showing a medium defect of severe severity present in the mid inferoseptal, mid inferior, and apical inferior location consistent with prior MI and peri-infarct ischemia with an EF of 30 to 44%. Overall, this was an intermediate risk study. He was seen in follow-up later that month by primary cardiologist and was feeling better. No further testing ordered.  He was seen in the office in 11/2019 and noted to be in A. fib with RVR with ventricular 133 bpm and BP 130/80. He had not yet taken his diltiazem that morning. No changes were made.  More recently, in 03/2020 he fell 3 times in 1 day which he attributed to instability of his right ankle.He was seen in the ED with head CT being nonacute. Following this fall he continued to note dizziness, fatigue, and nausea. He contacted our office in early 4/2022noting some swelling in his feet and legs. He reported his BP was well controlled at that time. His primary  cardiologist recommended Lasix 20 mg 2-3 times per week with KCl as well as leg elevation and compression stockings. Patient contacted our office on 4/6 noting a BP reading of 142/104 on 4/5 and in the setting appointment was scheduled.  He was seen in the office on 04/22/2020 at the request of his physical therapist to discuss elevated BP reading.  At that time, he was noted to be volume overloaded with lower extremity edema, abdominal distention, and a weight trend that was up 17 pounds when compared to his visit in 11/2019.  It remained uncertain if he truly had a cardiomyopathy, as MPI in 2016 showed an EF of 30 to 40%, and it was uncertain if this was falsely reduced.  We could not exclude some of his lower extremity swelling being exacerbated by calcium channel blocker usage.  In this setting, he was started on Lasix 40 mg daily for 3 days followed by 20 mg daily thereafter.  His diltiazem was discontinued and he was transitioned to metoprolol.  Echo was ordered and is scheduled for 05/18/2020.  He was last seen in the office on 04/28/2020 for close follow-up following the initiation of Lasix with noted improvement in dyspnea, lower extremity swelling, dizziness, appetite, and overall functional status.  His weight was down 7 pounds when compared to his weight on 4/7.  His BP and ventricular rates were improved.  He was continued on Lasix 20 mg daily with KCl 10 mEq daily.  It was noted he was taking Lopressor 50 mg once  daily and he was advised to start taking this twice daily.  He comes in doing well from a cardiac perspective.  Overall he does continue to feel like he is improving however he is somewhat fatigued today.  He indicates he spent the majority of yesterday working in his garden and typically when he does this it takes him about a day to recover.  He continues to note improvement in his lower extremity swelling.  His weight is stable.  No orthopnea, PND, or early satiety.  No falls, hematochezia,  or melena.  He is tolerating all medications without issues.  He does think he has inadvertently taken diltiazem by accident once since he was last seen.   Labs independently reviewed: 04/2020 - BUN 27, serum creatinine 1.00, potassium 4.2 03/2020-albumin 4.0, AST/ALT normal, Hgb 13.1, PLT 137 01/2020-A1c 5.6 10/2019-TC 123, TG 42, HDL 50, LDL 64, TSH normal    Past Medical History:  Diagnosis Date  . Atypical chest pain    a. 03/2012 St echo: nl EF, no wma's.  . CVA (cerebral vascular accident) (HCC)   . Depression   . Dysrhythmia   . Elevated PSA   . Essential hypertension   . Hyperlipidemia   . MVP (mitral valve prolapse)   . Persistent atrial fibrillation (HCC)    a. CHA2DS2VASc = 4-->chronic Xarelto.  . Psoriasis   . Reflux   . Sleep apnea   . Vitamin B 12 deficiency     Past Surgical History:  Procedure Laterality Date  . COLONOSCOPY WITH PROPOFOL    . CYSTOSCOPY WITH LITHOLAPAXY N/A 11/01/2018   Procedure: CYSTOSCOPY WITH LITHOLAPAXY;  Surgeon: [name], MD;  Location: [facility] ORS;  Service: Urology;  Laterality: N/A;  . HERNIA REPAIR    . HOLEP-LASER ENUCLEATION OF THE PROSTATE WITH MORCELLATION N/A 11/01/2018   Procedure: HOLEP-LASER ENUCLEATION OF THE PROSTATE WITH MORCELLATION;  Surgeon: [name], MD;  Location: [facility] ORS;  Service: Urology;  Laterality: N/A;  . TONSILLECTOMY    . VARICOSE VEIN SURGERY      Current Medications: Current Meds  Medication Sig  . CYANOCOBALAMIN ER PO Take 2,000 mcg by mouth daily.  . furosemide (LASIX) 20 MG tablet Take 1 tablet (20 mg total) by mouth daily.  . hydroxypropyl methylcellulose / hypromellose (ISOPTO TEARS / GONIOVISC) 2.5 % ophthalmic solution Place 1 drop into both eyes 2 (two) times daily as needed for dry eyes.  Marland Kitchen ibuprofen (ADVIL) 200 MG tablet Take 200 mg by mouth as needed.  . metoprolol tartrate (LOPRESSOR) 50 MG tablet TAKE 1 TABLET BY MOUTH TWICE A DAY  . OVER THE COUNTER MEDICATION Renew  Life Cleanse daily  . OVER THE COUNTER MEDICATION Repairvite Takes 1 8oz scoop in water daily  . potassium chloride (KLOR-CON) 10 MEQ tablet Take 1 tablet (10 mEq total) by mouth daily.  . rivaroxaban (XARELTO) 20 MG TABS tablet TAKE 1 TABLET EVERY DAY WITH SUPPER  . VITAMIN D, ERGOCALCIFEROL, PO Take 1,000 Int'l Units by mouth daily.    Allergies:   Other and Lactase   Social History   Socioeconomic History  . Marital status: Divorced    Spouse name: Not on file  . Number of children: Not on file  . Years of education: Not on file  . Highest education level: Not on file  Occupational History  . Not on file  Tobacco Use  . Smoking status: Never Smoker  . Smokeless tobacco: Never Used  Vaping Use  . Vaping  Use: Never used  Substance and Sexual Activity  . Alcohol use: Yes    Alcohol/week: 1.0 standard drink    Types: 1 Glasses of wine per week    Comment: social   . Drug use: No  . Sexual activity: Not Currently  Other Topics Concern  . Not on file  Social History Narrative  . Not on file   Social Determinants of Health   Financial Resource Strain: Not on file  Food Insecurity: Not on file  Transportation Needs: Not on file  Physical Activity: Not on file  Stress: Not on file  Social Connections: Not on file     Family History:  The patient's family history includes Heart attack in his father; Hyperlipidemia in his father; Hypertension in his father.  ROS:   Review of Systems  Constitutional: Positive for malaise/fatigue. Negative for chills, diaphoresis, fever and weight loss.  HENT: Negative for congestion.   Eyes: Negative for discharge and redness.  Respiratory: Negative for cough, sputum production, shortness of breath and wheezing.   Cardiovascular: Positive for leg swelling. Negative for chest pain, palpitations, orthopnea, claudication and PND.       Much improved bilateral lower extremity edema  Gastrointestinal: Negative for abdominal pain, blood in  stool, heartburn, melena, nausea and vomiting.  Musculoskeletal: Positive for joint pain. Negative for falls and myalgias.       Right ankle pain  Skin: Negative for rash.  Neurological: Positive for weakness. Negative for dizziness, tingling, tremors, sensory change, speech change, focal weakness and loss of consciousness.  Endo/Heme/Allergies: Does not bruise/bleed easily.  Psychiatric/Behavioral: Positive for memory loss. Negative for substance abuse. The patient is not nervous/anxious.   All other systems reviewed and are negative.    EKGs/Labs/Other Studies Reviewed:    Studies reviewed were summarized above. The additional studies were reviewed today:  [facility] Normal treadmill EKG without evidence of ischemia or dysrhythmia, excellent exercise tolerance for age, normal stress echocardiographic images without evidence of ischemia, mild TR, mild MR. __________  Nuclear stress test 10/2014:  There was no ST segment deviation noted during stress.  Defect 1: There is a medium defect of severe severity present in the mid inferoseptal, mid inferior and apical inferior location.  Findings consistent with prior myocardial infarction with peri-infarct ischemia.  This is an intermediate risk study.  The left ventricular ejection fraction is moderately decreased (30-44%).   EKG:  EKG is ordered today.  The EKG ordered today demonstrates A. fib, 100 bpm, LVH, nonspecific lateral ST-T changes  Recent Labs: 03/19/2020: ALT 35; Hemoglobin 13.1; Platelets 137 04/28/2020: BUN 27; Creatinine, Ser 1.00; Potassium 4.2; Sodium 143  Recent Lipid Panel No results found for: CHOL, TRIG, HDL, CHOLHDL, VLDL, LDLCALC, LDLDIRECT  PHYSICAL EXAM:    VS:  BP 120/64 (BP Location: Left Arm, Patient Position: Sitting, Cuff Size: Normal)   Pulse 100   Ht 6\' 1"  (1.854 m)   Wt 186 lb (84.4 kg)   SpO2 98%   BMI 24.54 kg/m   BMI: Body mass index is 24.54  kg/m.  Physical Exam Vitals reviewed.  Constitutional:      Appearance: He is well-developed.  HENT:     Head: Normocephalic and atraumatic.  Eyes:     General:        Right eye: No discharge.        Left eye: No discharge.  Neck:     Vascular: No JVD.  Cardiovascular:     Rate and Rhythm:  Normal rate. Rhythm irregularly irregular.     Pulses: No midsystolic click and no opening snap.          Posterior tibial pulses are 2+ on the right side and 2+ on the left side.     Heart sounds: Normal heart sounds, S1 normal and S2 normal. Heart sounds not distant. No murmur heard. No friction rub.  Pulmonary:     Effort: Pulmonary effort is normal. No respiratory distress.     Breath sounds: Normal breath sounds. No decreased breath sounds, wheezing or rales.  Chest:     Chest wall: No tenderness.  Abdominal:     General: There is no distension.     Palpations: Abdomen is soft.     Tenderness: There is no abdominal tenderness.  Musculoskeletal:     Cervical back: Normal range of motion.     Right lower leg: Edema present.     Left lower leg: Edema present.     Comments: Trace bilateral pretibial edema  Skin:    General: Skin is warm and dry.     Nails: There is no clubbing.  Neurological:     Mental Status: He is alert and oriented to person, place, and time.  Psychiatric:        Speech: Speech normal.        Behavior: Behavior normal.        Thought Content: Thought content normal.        Judgment: Judgment normal.     Wt Readings from Last 3 Encounters:  05/17/20 186 lb (84.4 kg)  05/11/20 183 lb (83 kg)  04/28/20 186 lb (84.4 kg)     ASSESSMENT & PLAN:   1. Volume overload with possible cardiomyopathy: He appears euvolemic and well compensated with NYHA class II-III symptoms.  He is scheduled for echo on 05/18/2020.  As noted at previous visit, Lexiscan MPI in 2016 demonstrated an EF of 30 to 40%.  If he is found to have a cardiomyopathy by echo we will plan to proceed  with a diagnostic R/LHC.  Escalate GDMT as indicated based on echo results.  For now, he remains on Lopressor, furosemide, and KCl.  Check BMP.  2. HTN: Blood pressure is well controlled in the office today.  Continue current medical therapy including Lopressor and furosemide.  Low-sodium diet is again recommended.  3. Permanent A. Fib: Ventricular rate were initially elevated at triage though improved at rest.  Continue recently initiated Lopressor 50 mg twice daily in place of calcium channel blocker.  Would continue to recommend avoidance of calcium channel blockers with prior noted lower extremity swelling with this medication class.  An "X" was placed on his Cardizem bottle to remind him not to take this medication.  Given his CHA2DS2-VASc of at least 5 (HTN, age x2, CVA x2) he remains on Xarelto.  No symptoms suggestive of bleeding.  Recent hemoglobin stable.  4. HLD: LDL 64 in 10/2019.  Not currently requiring a statin.  5. History of CVA: No new deficits.  He remains on [facility] as outlined above.  6. OSA: Continue CPAP.  7. Memory loss: Longstanding issue.  Follow-up with PCP.  Uncertain if he needs a formal neurologic evaluation.  Disposition: F/u with Dr. Mariah Milling or an APP pending echo.   Medication Adjustments/Labs and Tests Ordered: Current medicines are reviewed at length with the patient today.  Concerns regarding medicines are outlined above. Medication changes, Labs and Tests ordered today are summarized above and listed in  the Patient Instructions accessible in Encounters.   Signed, Eula Listen, PA-C 05/17/2020 3:24 PM     [facility] [address] [place], [place] [address] [phone]

## 2020-05-17 NOTE — Patient Instructions (Signed)
Medication Instructions:  No changes at this time.  *If you need a refill on your cardiac medications before your next appointment, please call your pharmacy*   Lab Work: BMET done today  If you have labs (blood work) drawn today and your tests are completely normal, you will receive your results only by: Marland Kitchen MyChart Message (if you have MyChart) OR . A paper copy in the mail If you have any lab test that is abnormal or we need to change your treatment, we will call you to review the results.   Testing/Procedures: None    Follow-Up: At [facility], you and your health needs are our priority.  As part of our continuing mission to provide you with exceptional heart care, we have created designated Provider Care Teams.  These Care Teams include your primary Cardiologist (physician) and Advanced Practice Providers (APPs -  Physician Assistants and Nurse Practitioners) who all work together to provide you with the care you need, when you need it.   Your next appointment:   Pending results of your echocardiogram tomorrow.

## 2020-05-18 ENCOUNTER — Ambulatory Visit (INDEPENDENT_AMBULATORY_CARE_PROVIDER_SITE_OTHER): Payer: Medicare PPO

## 2020-05-18 ENCOUNTER — Ambulatory Visit: Payer: Medicare PPO | Admitting: Urology

## 2020-05-18 DIAGNOSIS — I4821 Permanent atrial fibrillation: Secondary | ICD-10-CM

## 2020-05-18 DIAGNOSIS — I429 Cardiomyopathy, unspecified: Secondary | ICD-10-CM | POA: Diagnosis not present

## 2020-05-18 LAB — BASIC METABOLIC PANEL
BUN/Creatinine Ratio: 25 — ABNORMAL HIGH (ref 10–24)
BUN: 27 mg/dL (ref 8–27)
CO2: 22 mmol/L (ref 20–29)
Calcium: 9 mg/dL (ref 8.6–10.2)
Chloride: 101 mmol/L (ref 96–106)
Creatinine, Ser: 1.07 mg/dL (ref 0.76–1.27)
Glucose: 149 mg/dL — ABNORMAL HIGH (ref 65–99)
Potassium: 4.3 mmol/L (ref 3.5–5.2)
Sodium: 139 mmol/L (ref 134–144)
eGFR: 71 mL/min/{1.73_m2} (ref >59)

## 2020-05-19 LAB — ECHOCARDIOGRAM COMPLETE
AR max vel: 4.08 cm2
AV Area VTI: 3.41 cm2
AV Area mean vel: 3.47 cm2
AV Mean grad: 2.7 mmHg
AV Peak grad: 4.4 mmHg
Ao pk vel: 1.05 m/s
Calc EF: 46.3 %
S' Lateral: 4.3 cm
Single Plane A2C EF: 43.2 %
Single Plane A4C EF: 49.6 %

## 2020-05-20 ENCOUNTER — Telehealth: Payer: Self-pay | Admitting: *Deleted

## 2020-05-20 NOTE — Telephone Encounter (Signed)
Left voicemail message to call back for review of results.  

## 2020-05-20 NOTE — Telephone Encounter (Signed)
Patient calling to discuss recent testing results  ° °Please call  ° °

## 2020-05-20 NOTE — Telephone Encounter (Signed)
-----   Message from Sondra Barges, PA-C sent at 05/19/2020 11:22 AM EDT ----- Random glucose is mildly elevated.  Renal function is normal Potassium is at goal  Recommendations: -Await echo result note to call patient -Continue low dose Lasix and KCl -Work on cutting back sugary foods and drinks

## 2020-05-20 NOTE — Telephone Encounter (Signed)
Spoke with patient and reviewed results and recommendations. Scheduled him to come in next week to see provider here in the office to review all of this information and answer any questions he may have. He was agreeable to increase his medication to Furosemide 20 mg to 2 tablets (40 mg) daily with Potassium 10 mEq to 2 tablets (20 mEq) once daily as well. Advised of additional testing that may be ordered and that we would also obtain labs at the scheduled appointment coming up. He verbalized understanding of our conversation, agreement with plan, and no further questions at this time. He was appreciative for the call and review.

## 2020-05-20 NOTE — Progress Notes (Signed)
Cardiology Office Note    Date:  05/28/2020   ID:  Gary Dawson, Gary Dawson 1940/05/15, MRN [id number]  PCP:  Gracelyn Nurse, MD  Cardiologist:  Julien Nordmann, MD  Electrophysiologist:  None   Chief Complaint: Follow-up  History of Present Illness:   Gary Dawson is a 80 y.o. male with history of permanent A. fib on Xarelto, CVA in 06/2008, HTN, HLD, OSA on CPAP, and BPH who presents forfollow up of volume overload.  His history of A. fib dates back to approximately 2008. He was admitted with a CVA involving the left posterior parietal lobe in 06/2008 with echo at that time showing EF greater than 55%, mildly dilated left atrium, mild LVH, mild to moderate TR, and mild to moderate mitral regurgitation. Carotid artery ultrasoundshowed no significant plaque. Treadmill stress echo in 03/2012 showed no wall motion abnormalities concerning for ischemia with good exercise tolerance.  He was seen in 10/2014 for chest pain with subsequent nuclear stress test showing a medium defect of severe severity present in the mid inferoseptal, mid inferior, and apical inferior location consistent with prior MI and peri-infarct ischemia with an EF of 30 to 44%. Overall, this was an intermediate risk study. He was seen in follow-up later that month by primary cardiologist and was feeling better. No further testing ordered.  He was seen in the office in 11/2019 and noted to be in A. fib with RVR with ventricular 133 bpm and BP 130/80. He had not yet taken his diltiazem thatmorning. No changes were made.  More recently, in 03/2020 he fell 3 times in 1 day which he attributed to instability of his right ankle.He was seen in the ED with head CT being nonacute. Following this fall he continued to note dizziness, fatigue, and nausea. He contacted our office in early 4/2022noting some swelling in his feet and legs. He reported his BP was well controlled at that time.His primary  cardiologist recommended Lasix 20 mg 2-3 times per week with KCl as well as leg elevation and compression stockings. Patient contacted our office on 4/6 noting a BP reading of 142/104 on 4/5 and in the setting appointment was scheduled.  He was seen in the office on 04/22/2020 at the request of his physical therapist to discuss elevated BP reading. At that time,he was noted to be volume overloaded with lower extremity edema, abdominal distention, and a weight trend that was up 17 pounds when compared to his visit in 11/2019.It remaineduncertain if he truly had a cardiomyopathy,as MPI in 2016 showed an EF of 30 to 40%,and it was uncertain if this was falsely reduced. We could notexclude some of his lower extremity swelling being exacerbated by calcium channel blocker usage. In this setting, he was started on Lasix 40 mg daily for 3 days followed by 20 mg daily thereafter. His diltiazem was discontinued and he was transitioned to metoprolol.  He was seen in the office on 04/28/2020 for close follow-up following the initiation of Lasix with noted improvement in dyspnea, lower extremity swelling, dizziness, appetite, and overall functional status.  His weight was down 7 pounds when compared to his weight on 4/7.  His BP and ventricular rates were improved.  He was continued on Lasix 20 mg daily with KCl 10 mEq daily.  It was noted he was taking Lopressor 50 mg once daily and he was advised to start taking this twice daily.  He was last seen in the office on 05/17/2020 and continued to feel  like he was improving following diuresis and improved rate control.  His weight was stable.  He subsequently underwent previously scheduled echo on 05/18/2020 which showed an EF of 45%, global hypokinesis, moderately reduced RV systolic function with mildly enlarged RV cavity size, mildly elevated PASP, moderately dilated left atrium, severely dilated right atrium, degenerative mitral valve with trivial regurgitation and  moderate mitral annular calcification, severe tricuspid regurgitation, mild aortic valve insufficiency, mild to moderate aortic valve sclerosis without evidence of stenosis, dilated aortic root and ascending aorta, estimated right atrial pressure of 15 mmHg, and evidence of an ASD with predominantly left to right shunting across the atrial septum.  In this setting, it was recommended his Lasix be doubled to 40 mg daily and KCl be increased to 20 mEq daily with plans to follow-up today for discussion of further testing.  Comes in continuing to do well from a cardiac perspective.  He continues to note improvement in his overall functional status and lower extremity swelling.  He denies any chest pain, dyspnea, palpitations, dizziness, presyncope, or syncope.  He did not escalate furosemide as outlined above and has remained on Lasix 20 mg daily.  He does continue to watch his salt and p.o. fluid intake.  No falls, hematochezia, or melena.  He has stable two-pillow orthopnea.  He does not have any issues or concerns at this time.   Labs independently reviewed: 04/2020 - BUN 27, serum creatinine 1.00, potassium 4.2 03/2020-albumin 4.0, AST/ALT normal, Hgb 13.1, PLT 137 01/2020-A1c 5.6 10/2019-TC 123, TG 42, HDL 50, LDL 64, TSH    Past Medical History:  Diagnosis Date  . Atypical chest pain    a. 03/2012 St echo: nl EF, no wma's.  . CVA (cerebral vascular accident) (HCC)   . Depression   . Dysrhythmia   . Elevated PSA   . Essential hypertension   . Hyperlipidemia   . MVP (mitral valve prolapse)   . Persistent atrial fibrillation (HCC)    a. CHA2DS2VASc = 4-->chronic Xarelto.  . Psoriasis   . Reflux   . Sleep apnea   . Vitamin B 12 deficiency     Past Surgical History:  Procedure Laterality Date  . COLONOSCOPY WITH PROPOFOL    . CYSTOSCOPY WITH LITHOLAPAXY N/A 11/01/2018   Procedure: CYSTOSCOPY WITH LITHOLAPAXY;  Surgeon: [name], MD;  Location: [facility] ORS;  Service: Urology;   Laterality: N/A;  . HERNIA REPAIR    . HOLEP-LASER ENUCLEATION OF THE PROSTATE WITH MORCELLATION N/A 11/01/2018   Procedure: HOLEP-LASER ENUCLEATION OF THE PROSTATE WITH MORCELLATION;  Surgeon: [name], MD;  Location: [facility] ORS;  Service: Urology;  Laterality: N/A;  . TONSILLECTOMY    . VARICOSE VEIN SURGERY      Current Medications: Current Meds  Medication Sig  . CYANOCOBALAMIN ER PO Take 2,000 mcg by mouth daily.  . furosemide (LASIX) 20 MG tablet Take 1 tablet (20 mg total) by mouth daily.  . hydroxypropyl methylcellulose / hypromellose (ISOPTO TEARS / GONIOVISC) 2.5 % ophthalmic solution Place 1 drop into both eyes 2 (two) times daily as needed for dry eyes.  Marland Kitchen ibuprofen (ADVIL) 200 MG tablet Take 200 mg by mouth as needed.  Marland Kitchen OVER THE COUNTER MEDICATION Renew Life Cleanse daily  . OVER THE COUNTER MEDICATION Repairvite Takes 1 8oz scoop in water daily  . potassium chloride (KLOR-CON) 10 MEQ tablet Take 1 tablet (10 mEq total) by mouth daily.  . rivaroxaban (XARELTO) 20 MG TABS tablet TAKE 1 TABLET EVERY DAY  WITH SUPPER  . VITAMIN D, ERGOCALCIFEROL, PO Take 1,000 Int'l Units by mouth daily.  . [DISCONTINUED] metoprolol tartrate (LOPRESSOR) 50 MG tablet TAKE 1 TABLET BY MOUTH TWICE A DAY    Allergies:   Other and Lactase   Social History   Socioeconomic History  . Marital status: Divorced    Spouse name: Not on file  . Number of children: Not on file  . Years of education: Not on file  . Highest education level: Not on file  Occupational History  . Not on file  Tobacco Use  . Smoking status: Never Smoker  . Smokeless tobacco: Never Used  Vaping Use  . Vaping Use: Never used  Substance and Sexual Activity  . Alcohol use: Yes    Alcohol/week: 1.0 standard drink    Types: 1 Glasses of wine per week    Comment: social   . Drug use: No  . Sexual activity: Not Currently  Other Topics Concern  . Not on file  Social History Narrative  . Not on file   Social  Determinants of Health   Financial Resource Strain: Not on file  Food Insecurity: Not on file  Transportation Needs: Not on file  Physical Activity: Not on file  Stress: Not on file  Social Connections: Not on file     Family History:  The patient's family history includes Heart attack in his father; Hyperlipidemia in his father; Hypertension in his father.  ROS:   Review of Systems  Constitutional: Positive for malaise/fatigue. Negative for chills, diaphoresis, fever and weight loss.  HENT: Negative for congestion.   Eyes: Negative for discharge and redness.  Respiratory: Negative for cough, hemoptysis, sputum production, shortness of breath and wheezing.   Cardiovascular: Positive for orthopnea and leg swelling. Negative for chest pain, palpitations, claudication and PND.       Stable 2 pillow orthopnea   Gastrointestinal: Negative for abdominal pain, blood in stool, heartburn, melena, nausea and vomiting.  Genitourinary: Negative for hematuria.  Musculoskeletal: Negative for falls and myalgias.  Skin: Negative for rash.  Neurological: Positive for weakness. Negative for dizziness, tingling, tremors, sensory change, speech change, focal weakness and loss of consciousness.  Endo/Heme/Allergies: Does not bruise/bleed easily.  Psychiatric/Behavioral: Negative for substance abuse. The patient is not nervous/anxious.   All other systems reviewed and are negative.    EKGs/Labs/Other Studies Reviewed:    Studies reviewed were summarized above. The additional studies were reviewed today:  [facility] Normal treadmill EKG without evidence of ischemia or dysrhythmia, excellent exercise tolerance for age, normal stress echocardiographic images without evidence of ischemia, mild TR, mild MR. __________  Nuclear stress test 10/2014:  There was no ST segment deviation noted during stress.  Defect 1: There is a medium defect of severe severity present in  the mid inferoseptal, mid inferior and apical inferior location.  Findings consistent with prior myocardial infarction with peri-infarct ischemia.  This is an intermediate risk study.  The left ventricular ejection fraction is moderately decreased (30-44%). __________  2D echo 05/18/2020: 1. Left ventricular ejection fraction, by estimation, is 45%. The left  ventricle has mild to moderately decreased function. The left ventricle  demonstrates global hypokinesis. There is mild left ventricular  hypertrophy. Left ventricular diastolic  parameters are indeterminate.  2. Right ventricular systolic function is moderately reduced. The right  ventricular size is mildly enlarged. There is mildly elevated pulmonary  artery systolic pressure.  3. Left atrial size was moderately dilated.  4. Right atrial  size was severely dilated.  5. The mitral valve is degenerative. Trivial mitral valve regurgitation.  Moderate mitral annular calcification.  6. Tricuspid valve regurgitation is severe.  7. The aortic valve is tricuspid. There is moderate thickening of the  aortic valve. Aortic valve regurgitation is mild. Mild to moderate aortic  valve sclerosis/calcification is present, without any evidence of aortic  stenosis.  8. Aortic dilatation noted. There is mild dilatation of the aortic root  and of the ascending aorta, measuring 42 mm.  9. The inferior vena cava is dilated in size with <50% respiratory  variability, suggesting right atrial pressure of 15 mmHg.  10. Evidence of atrial level shunting detected by color flow Doppler.  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   EKG:  EKG is ordered today.  The EKG ordered today demonstrates A. fib, 99 bpm, LVH, nonspecific lateral ST-T changes  Recent Labs: 03/19/2020: ALT 35 05/28/2020: BUN 32; Creatinine, Ser 0.82; Hemoglobin 12.3; Platelets 203; Potassium 4.1; Sodium 137  Recent  Lipid Panel No results found for: CHOL, TRIG, HDL, CHOLHDL, VLDL, LDLCALC, LDLDIRECT  PHYSICAL EXAM:    VS:  BP 120/70 (BP Location: Left Arm, Patient Position: Sitting, Cuff Size: Normal)   Pulse 99   Ht 6\' 1"  (1.854 m)   Wt 182 lb (82.6 kg) Comment: verbally given by patient-declined to get on scale  SpO2 97%   BMI 24.01 kg/m   BMI: Body mass index is 24.01 kg/m.  Physical Exam Vitals reviewed.  Constitutional:      Appearance: He is well-developed.  HENT:     Head: Normocephalic and atraumatic.  Eyes:     General:        Right eye: No discharge.        Left eye: No discharge.  Neck:     Vascular: No JVD.  Cardiovascular:     Rate and Rhythm: Normal rate. Rhythm irregularly irregular.     Pulses: No midsystolic click and no opening snap.          Posterior tibial pulses are 2+ on the right side and 2+ on the left side.     Heart sounds: Normal heart sounds, S1 normal and S2 normal. Heart sounds not distant. No murmur heard. No friction rub.  Pulmonary:     Effort: Pulmonary effort is normal. No respiratory distress.     Breath sounds: Normal breath sounds. No decreased breath sounds, wheezing or rales.  Chest:     Chest wall: No tenderness.  Abdominal:     General: There is no distension.     Palpations: Abdomen is soft.     Tenderness: There is no abdominal tenderness.  Musculoskeletal:     Cervical back: Normal range of motion.     Right lower leg: Edema present.     Left lower leg: Edema present.     Comments: Trace to 1+ bilateral pretibial edema  Skin:    General: Skin is warm and dry.     Nails: There is no clubbing.  Neurological:     Mental Status: He is alert and oriented to person, place, and time.  Psychiatric:        Speech: Speech normal.        Behavior: Behavior normal.        Thought Content: Thought content normal.        Judgment: Judgment normal.     Wt Readings from Last 3  Encounters:  05/28/20 182 lb (82.6 kg)  05/17/20 186 lb (84.4  kg)  05/11/20 183 lb (83 kg)     ASSESSMENT & PLAN:   1. Acute HFrEF: He appears well compensated and mildly volume overloaded with trace to 1+ bilateral lower extremity edema which is stable.  NYHA class II-III symptoms.  As noted at previous visits, Lexiscan MPI in 2016 demonstrated an EF of 30 to 40%.  Subsequent echo obtained this month showed a cardiomyopathy with an EF of 45% with global hypokinesis as outlined above.  In the setting we will pursue a diagnostic R/LHC.  Further recommendations based on these results.  Continue to escalate GDMT moving forward as BP and heart rate allow.  Ideally, we would transition him to Toprol-XL or carvedilol, however with ventricular rate of 99 bpm precludes this transition at this time.  Titrate Lopressor to 75 mg twice daily as outlined above.  He will otherwise continue Lasix 20 mg daily with KCl 10 mEq daily.  Escalate GDMT as able moving forward with addition of ACEi/ARB/ARNI/MRA/SGLT2i.  CHF education.  Risks and benefits of cardiac catheterization have been discussed with the patient including risks of bleeding, bruising, infection, kidney damage, stroke, heart attack, urgent need for bypass, injury to a limb, and death. The patient understands these risks and is willing to proceed with the procedure. All questions have been answered and concerns listened to.  2. Permanent A. Fib: Ventricular rates are suboptimally controlled.  Titrate Lopressor to 75 mg twice daily.  Continue Xarelto given a CHADS2VAS at least 6 (CHF, HTN, age x2, CVA x2).  CrCl 85.63.  No symptoms concerning for bleeding.  Check BMP and CBC.  He has been advised to hold Xarelto 2 days prior to diagnostic [facility] with resumption of OAC 24 hours post cath if there are no bleeding concerns.  3. ASD: Noted on recent echo along with a severely dilated right atrium, reduced RV systolic function and dilated RV, and pulmonary hypertension.  Following diagnostic cath he will be referred to the  structural heart team.  4. Dilated aortic root/ascending aorta: Plan for CTA of the aorta down the road (this has been deferred at this time given national contrast shortage).  Optimal blood pressure control recommended  5. HTN: Blood pressure is well controlled.  Continue medical therapy as outlined above.  6. HLD: LDL 64 in 10/2019.  Not currently on a statin.  7. History of CVA: No new deficits.  He remains on Xarelto given underlying A. fib as outlined above.  8. OSA: Continue CPAP.   Disposition: F/u with Dr. Mariah Milling or an APP 1 week after cath.   Medication Adjustments/Labs and Tests Ordered: Current medicines are reviewed at length with the patient today.  Concerns regarding medicines are outlined above. Medication changes, Labs and Tests ordered today are summarized above and listed in the Patient Instructions accessible in Encounters.   Signed, Eula Listen, PA-C 05/28/2020 1:02 PM     [facility] [address] [place], [place] [address] [phone]

## 2020-05-20 NOTE — Telephone Encounter (Signed)
-----   Message from Sondra Barges, PA-C sent at 05/19/2020 12:00 PM EDT ----- Echo showed reduced pump function function of both the left and right sides of the heart, elevated pressures suggestive of continued volume overload, trivially leaky mitral valve, mildly leaky aortic valve, mild to moderate thickening of the aortic valve without narrowing, mildly dilated aortic root and ascending aorta, and a congential defect involving the atrial septum with an ASD noted.   Recommendations: -Schedule appointment with me next week to discuss [facility] given his cardiomyopathy with history of prior abnormal Myoview -He remains volume up, please have him increase his Lasix to 40 mg daily and KCl to 20 mEq daily for the next week. He will follow up a BMET at his visit with me next week. -We will refer him to the structural heart team following [facility] for further evaluation of his ASD -Will need CTA aorta down the road for evaluation of dilated aortic root and ascending aorta noted on echo, will order at a later date

## 2020-05-24 ENCOUNTER — Telehealth: Payer: Self-pay | Admitting: Physician Assistant

## 2020-05-24 NOTE — Telephone Encounter (Signed)
Left detailed voicemail message that he should not take more than 1 tablet a day of the excedrin migraine and to please give [place] a call back with any questions. Attempted to call his mobile number but no answer/No voicemail available.

## 2020-05-24 NOTE — Telephone Encounter (Signed)
Spoke with patient and he reports having headache since last week. He has tried tylenol and ibuprofen. By Sunday it went away and came back this morning. He does not have any blood pressures at this time and is at work. He reports that he can go to nurses station to have it checked and can then call me back. Requested that he call back with reading and request to speak with me. He was appreciative for call with no further questions.

## 2020-05-24 NOTE — Telephone Encounter (Signed)
No answer/Voicemail box not set up.  

## 2020-05-24 NOTE — Telephone Encounter (Signed)
Patient called back and reviewed that he should limit the use of Excedrin Migraine to no more than 1 tablet daily give that he is on xarelto as well. He verbalized understanding. Reviewed that I sent him Mychart message, left voicemail message on home number, and that he could disregard those messages. He verbalized understanding with no further questions at this time.

## 2020-05-24 NOTE — Telephone Encounter (Signed)
No answer/No voicemail box set up on mobile number No answer/Left voicemail message on home number.

## 2020-05-24 NOTE — Telephone Encounter (Signed)
Patient called back to review blood pressure reading obtained by nurse. It was 82/62 and they reported irregular heart beat. Patient does have atrial fibrillation which would account for the irregularity. He denies any symptoms with the low blood pressures and states that while he was surprised he feels fine. Recommended he try excedrin Migraine for his persistent headache and if that does not help to reach out to his primary care provider. He has appointment on this Friday to see Eula Listen PA-C and felt this could wait until then. Reviewed that blood pressure was low and to monitor for any symptoms for that as well. Confirmed appointment and advised we could review all of this at his upcoming visit. He verbalized understanding, was agreeable with plan, and had no further questions at this time. He also understands to call back if symptoms should worsen.

## 2020-05-24 NOTE — Telephone Encounter (Signed)
Patient is calling in to state he has a headache and is requesting to speak with Dortha Schwalbe nurse. States the headache had been on and off for 3 days now. Patient reports a calm weekend to reduce headache but can still feel it  Please advise

## 2020-05-25 NOTE — Telephone Encounter (Signed)
1) Please get update on BP. Uncertain if supplied BP is accurate, though if so, we will need to address his medications.  2) Patient will need to contact PCP for headache, not cardiology.

## 2020-05-26 NOTE — Telephone Encounter (Signed)
Spoke with patient and reviewed recommendations from provider. Most recent blood pressure was 118/68. He tried something for the headache and it took care of that. Confirmed upcoming appointment and he verbalized understanding with no further questions at this time.

## 2020-05-26 NOTE — Telephone Encounter (Signed)
No answer/No voicemail box set up on mobile number. Left voicemail message on home number to call back for review of his blood pressures.

## 2020-05-26 NOTE — Telephone Encounter (Signed)
No answer/No voicemail box set up. Provider notified that he is very hard to get via phone and will close encounter given that he has appointment this week.

## 2020-05-28 ENCOUNTER — Ambulatory Visit: Payer: Medicare PPO | Admitting: Physician Assistant

## 2020-05-28 ENCOUNTER — Other Ambulatory Visit
Admission: RE | Admit: 2020-05-28 | Discharge: 2020-05-28 | Disposition: A | Discharge status: Home / Self Care | Payer: Medicare PPO | Source: Home / Self Care | Attending: Physician Assistant | Admitting: Physician Assistant

## 2020-05-28 ENCOUNTER — Telehealth: Payer: Self-pay | Admitting: *Deleted

## 2020-05-28 ENCOUNTER — Encounter: Payer: Self-pay | Admitting: Physician Assistant

## 2020-05-28 ENCOUNTER — Other Ambulatory Visit: Payer: Self-pay

## 2020-05-28 ENCOUNTER — Other Ambulatory Visit
Admission: RE | Admit: 2020-05-28 | Discharge: 2020-05-28 | Disposition: A | Discharge status: Home / Self Care | Payer: Medicare PPO | Source: Ambulatory Visit | Attending: Physician Assistant | Admitting: Physician Assistant

## 2020-05-28 ENCOUNTER — Encounter: Payer: Self-pay | Admitting: *Deleted

## 2020-05-28 VITALS — BP 120/70 | HR 99 | Ht 73.0 in | Wt 182.0 lb

## 2020-05-28 DIAGNOSIS — I4891 Unspecified atrial fibrillation: Secondary | ICD-10-CM | POA: Diagnosis present

## 2020-05-28 DIAGNOSIS — Z8673 Personal history of transient ischemic attack (TIA), and cerebral infarction without residual deficits: Secondary | ICD-10-CM

## 2020-05-28 DIAGNOSIS — Z9989 Dependence on other enabling machines and devices: Secondary | ICD-10-CM

## 2020-05-28 DIAGNOSIS — I4821 Permanent atrial fibrillation: Secondary | ICD-10-CM

## 2020-05-28 DIAGNOSIS — I77810 Thoracic aortic ectasia: Secondary | ICD-10-CM

## 2020-05-28 DIAGNOSIS — Q211 Atrial septal defect, unspecified: Secondary | ICD-10-CM

## 2020-05-28 DIAGNOSIS — I5021 Acute systolic (congestive) heart failure: Secondary | ICD-10-CM

## 2020-05-28 DIAGNOSIS — R0602 Shortness of breath: Secondary | ICD-10-CM | POA: Diagnosis present

## 2020-05-28 DIAGNOSIS — G4733 Obstructive sleep apnea (adult) (pediatric): Secondary | ICD-10-CM

## 2020-05-28 DIAGNOSIS — Z20822 Contact with and (suspected) exposure to covid-19: Secondary | ICD-10-CM | POA: Diagnosis not present

## 2020-05-28 DIAGNOSIS — E785 Hyperlipidemia, unspecified: Secondary | ICD-10-CM

## 2020-05-28 DIAGNOSIS — I10 Essential (primary) hypertension: Secondary | ICD-10-CM

## 2020-05-28 LAB — BASIC METABOLIC PANEL
Anion gap: 10 (ref 5–15)
BUN: 32 mg/dL — ABNORMAL HIGH (ref 8–23)
CO2: 24 mmol/L (ref 22–32)
Calcium: 8.9 mg/dL (ref 8.9–10.3)
Chloride: 103 mmol/L (ref 98–111)
Creatinine, Ser: 0.82 mg/dL (ref 0.61–1.24)
GFR, Estimated: >60 mL/min (ref >60)
Glucose, Bld: 97 mg/dL (ref 70–99)
Potassium: 4.1 mmol/L (ref 3.5–5.1)
Sodium: 137 mmol/L (ref 135–145)

## 2020-05-28 LAB — CBC
HCT: 36.6 % — ABNORMAL LOW (ref 39.0–52.0)
Hemoglobin: 12.3 g/dL — ABNORMAL LOW (ref 13.0–17.0)
MCH: 33.4 pg (ref 26.0–34.0)
MCHC: 33.6 g/dL (ref 30.0–36.0)
MCV: 99.5 fL (ref 80.0–100.0)
Platelets: 203 10*3/uL (ref 150–400)
RBC: 3.68 MIL/uL — ABNORMAL LOW (ref 4.22–5.81)
RDW: 14.2 % (ref 11.5–15.5)
WBC: 7.3 10*3/uL (ref 4.0–10.5)
nRBC: 0 % (ref 0.0–0.2)

## 2020-05-28 LAB — SARS CORONAVIRUS 2 (TAT 6-24 HRS): SARS Coronavirus 2: NEGATIVE

## 2020-05-28 MED ORDER — METOPROLOL TARTRATE 50 MG PO TABS: 75.0000 mg | ORAL_TABLET | Freq: Two times a day (BID) | ORAL | 3 refills | Status: DC

## 2020-05-28 NOTE — Telephone Encounter (Signed)
Patient called back and after much consideration he want to cancel his procedure for Monday. He had labs and COVID swab done today. He states that he has too much on his plate and that he is only hanging by a thread so he is not able to set up everything to proceed at this time. He wants to wait for now. Reviewed indications for this procedure and importance to have done so we can effectively treat him. He acknowledged the reasons but that he still just has too much going on at this time. Notified Sarah in [employer] and provider of his desire to cancel at this time. He verbalized understanding of our conversation, agreement with plan, and had no further questions at this time. Confirmed we still have appointment scheduled for 06/11/20 to come in to review concerns with provider.

## 2020-05-28 NOTE — Telephone Encounter (Signed)
Left voicemail message on home number. Attempted to call mobile number but no answer/No voicemail box set up.

## 2020-05-28 NOTE — Patient Instructions (Addendum)
Medication Instructions:  Your physician has recommended you make the following change in your medication:   1. INCREASE Metoprolol tartrate 50 mg and take one and one half tablet (75 mg) twice a day  *If you need a refill on your cardiac medications before your next appointment, please call your pharmacy*   Lab Work: BMET & CBC  If you have labs (blood work) drawn today and your tests are completely normal, you will receive your results only by: Marland Kitchen MyChart Message (if you have MyChart) OR . A paper copy in the mail If you have any lab test that is abnormal or we need to change your treatment, we will call you to review the results.   Testing/Procedures: I will send instructions through My Chart   Follow-Up: At [facility], you and your health needs are our priority.  As part of our continuing mission to provide you with exceptional heart care, we have created designated Provider Care Teams.  These Care Teams include your primary Cardiologist (physician) and Advanced Practice Providers (APPs -  Physician Assistants and Nurse Practitioners) who all work together to provide you with the care you need, when you need it.   Your next appointment:   2 week(s)  The format for your next appointment:   In Person  Provider:   Eula Listen, PA-C

## 2020-05-28 NOTE — Telephone Encounter (Signed)
Left voicemail message to call back for review of results and procedure instructions.

## 2020-05-28 NOTE — Telephone Encounter (Signed)
-----   Message from Sondra Barges, [place] sent at 05/28/2020  1:07 PM EDT ----- Jolaine Click labs okay with a mild anemia noted.

## 2020-05-28 NOTE — Telephone Encounter (Signed)
Reviewed results and instructions for his upcoming heart cath on Monday. We discussed instructions in detail and advised that I sent this by letter and My Chart for him to review. He wanted to spell each word, medications, and location with time. Advised that I would like for him to access these documents, read them, and call me back so that I can discuss any questions he may have. Advised that if he does not call me before 5 pm then I will call him shortly after that to verify he has read through instructions. He was agreeable with this plan and was appreciative for the calls to make sure he understands all of his information.

## 2020-05-28 NOTE — Telephone Encounter (Signed)
Left voicemail to call back for review of results and review of instructions for procedure.

## 2020-05-31 ENCOUNTER — Ambulatory Visit (HOSPITAL_COMMUNITY): Admission: RE | Admit: 2020-05-31 | Payer: Medicare PPO | Source: Ambulatory Visit | Admitting: Internal Medicine

## 2020-05-31 ENCOUNTER — Encounter (HOSPITAL_COMMUNITY): Admission: RE | Payer: Self-pay | Source: Ambulatory Visit

## 2020-05-31 SURGERY — RIGHT/LEFT HEART CATH AND CORONARY ANGIOGRAPHY
Anesthesia: LOCAL

## 2020-05-31 NOTE — Telephone Encounter (Signed)
He is asymptomatic. Cath is not urgent. Given the national contrast shortage, we can revisit timing of his cath in follow up.

## 2020-06-07 ENCOUNTER — Other Ambulatory Visit: Payer: Self-pay | Admitting: Physician Assistant

## 2020-06-07 NOTE — Telephone Encounter (Signed)
Patient called back and is ready to schedule his procedure. He has appointment later this week so advised we can get this all scheduled when he comes in. He verbalized understanding with no further questions at this time.

## 2020-06-07 NOTE — Telephone Encounter (Signed)
Thank you for the update.  We will discuss the timing of his cath at his follow-up visit given [place] has now instituted strict guidelines for outpatient diagnostic cardiac caths given the national contrast shortage.

## 2020-06-07 NOTE — Telephone Encounter (Signed)
Patient called wants to reschedule

## 2020-06-11 ENCOUNTER — Ambulatory Visit: Payer: Medicare PPO | Admitting: Physician Assistant

## 2020-06-16 ENCOUNTER — Telehealth: Payer: Self-pay | Admitting: Physician Assistant

## 2020-06-16 NOTE — Telephone Encounter (Signed)
Pt c/o medication issue:  1. Name of Medication:  Metoprolol   2. How are you currently taking this medication (dosage and times per day)?  Stopped 4 days ago   3. Are you having a reaction (difficulty breathing--STAT)? Muscles sore memory issues, disoriented, insomnia   4. What is your medication issue? Unable to tolerate please call to discuss

## 2020-06-17 NOTE — Telephone Encounter (Signed)
Spoke with patient and he reports just not feeling well and he stopped metoprolol. Since stopping medication he has been feeling better, more awake, can now see better, and previously his ankle had been bothering him and now not as much. He has been off the metoprolol for approximately 3 days and now feeling much better. Advised that I would send this over to provider for his review and would be in touch with any further recommendations. He was agreeable with this plan and had no further questions at this time.

## 2020-06-17 NOTE — Telephone Encounter (Signed)
Patient calling to check on status of medication advise .  Please call .

## 2020-06-17 NOTE — Telephone Encounter (Signed)
Patient's ankle was bothering him long before starting metoprolol.  Therefore I do not think the medication is related to his ankle pain.  He indicated he had been working with physical therapy for this.  He also indicates he was not aware of taking metoprolol before his last visit on 05/28/2020.  However, he was started on this medication on 04/22/2020 and noted significant improvement in symptoms on 04/28/2020.  I continue to have significant concerns regarding the patient's cognitive status.  Please reach out to the patient's PCP and have his mental status to be evaluated.    Without rate controlling medications, in the context of permanent A. fib, he is likely to become quickly volume overloaded.  I do recommend he resume metoprolol and follow-up as planned.

## 2020-06-17 NOTE — Telephone Encounter (Signed)
Reviewed recommendations from provider and he inquired about taking once a day. Advised that I am not able to approve that once daily dosing and that provider would like him to resume medication at dosage prescribed. He verbalized understanding and confirmed upcoming appointment here in the office. He was appreciative for the call with no further questions at this time.

## 2020-06-21 NOTE — Telephone Encounter (Signed)
Attempted to call the patient x 2 attempts. No answer- no voice mail set up.  Will attempt to call the patient back at a later time.

## 2020-06-21 NOTE — Telephone Encounter (Signed)
Patient was having ankle pain prior to starting metoprolol and indicated he was being evaluated by PT for this. At the time he mentioned he was having ankle pain to me (the first time I saw him), he was taking diltiazem. I do not think the metoprolol is causing his ankle pain.   He has underlying memory/cognitive dysfunction which needs to be evaluated by his PCP. Metoprolol could potentially exacerbate this and underlying fatigue.   He is not a good candidate for rechallenge of calcium channel blockers due to vasodilation, lower extremity swelling, and cardiomyopathy. He has a cardiomyopathy that needs to be evaluated as he has previously cancelled cardiac cath.   Recommendations: -Stop Lopressor  -Can try Toprol XL 50 mg QHS  -If he does not tolerate Toprol, the next option would be Coreg, last resort would be digoxin for rate control as I would like to have him on a beta blocker for his cardiomyopathy -He needs to see his PCP for evaluation of cognitive function as I do think he has memory loss. He can also see them for his remaining noncardiac concerns

## 2020-06-21 NOTE — Telephone Encounter (Signed)
Patient calling back in to speak with Pam. States he will call back to discuss with her later in the week

## 2020-06-21 NOTE — Telephone Encounter (Signed)
Patient calling  States that he feels like he is getting worse and would like to discuss medication Please call to discuss

## 2020-06-21 NOTE — Telephone Encounter (Signed)
I called and spoke with the patient at his alternative #.  He states he stopped his metoprolol ~ 2-3 days ago.  He advised he was having impaired thinking, blurred vision, trouble with saying & spelling his words. He feels symptoms have improved, although not resolved, since stopping the medication. He states he never increased his metoprolol tartrate to the 75 mg BID dose as directed at his last office visit with Eula Listen, PA. He felt this was too much & "I'm just not going to do that."  I advised when he saw Alycia Rossetti back in April, his diltiazem was stopped due to swelling and metoprolol tart 50 mg BID was started to help with his heart muscle function and control his heart rates.  I inquired if he has been checking his BP/HR at home. He advised "I have 3-4 machines, but they don't work. I keep meaning to bring them in so you can show me how to use them."  I inquired if he was having any of these same symptoms prior to starting metoprolol in April. He advised "not as bad."  I have advised the patient to: 1) call his PCP tomorrow to request an appointment to make sure there is nothing underlying causing his symptoms. 2) ok to continue to hold metoprolol tonight until we can address with Eula Listen, PA tomorrow.  The patient voices understanding and is agreeable.  He advised to call: Home # 1st & Mobile # 2nd (he does not have VM as he does not want anyone leaving him messages.)

## 2020-06-22 ENCOUNTER — Ambulatory Visit: Payer: Medicare PPO | Admitting: Urology

## 2020-06-22 MED ORDER — METOPROLOL SUCCINATE ER 50 MG PO TB24: 50.0000 mg | ORAL_TABLET | Freq: Every day | ORAL | 0 refills | Status: DC

## 2020-06-22 NOTE — Addendum Note (Signed)
Addended by: Bryna Colander on: 06/22/2020 01:46 PM   Modules accepted: Orders

## 2020-06-22 NOTE — Telephone Encounter (Signed)
Spoke with patient and reviewed recommendations to stop Metoprolol and start Toprol XL 50 mg daily at bedtime. Also confirmed upcoming appointment here in our office. He verbalized understanding of our conversation, agreement with plan, and had no further questions at this time.

## 2020-06-25 ENCOUNTER — Telehealth: Payer: Self-pay | Admitting: Cardiovascular Disease

## 2020-06-25 NOTE — Telephone Encounter (Signed)
STAT if patient feels like he/she is going to faint   Are you dizzy now? No   Do you feel faint or have you passed out? no  Do you have any other symptoms? Dizziness yesterday so took an extra metoprolol   Have you checked your HR and BP (record if available)? No patient doesn't know how to use home device and doesn't trust pharmacy machine

## 2020-06-25 NOTE — Telephone Encounter (Signed)
No answer.  No voicemail setup

## 2020-06-25 NOTE — Telephone Encounter (Signed)
Left voicemail message to call back on home number.

## 2020-06-25 NOTE — Telephone Encounter (Signed)
Pt returned call. Pt with difficulty articulating situation. Spent approx 15 min on the phone trying to discuss pt's concern. Pt does have underlying memory/cognitive dysfunction per chart.   Pt states yesterday after being outside he felt "disoriented" and took his Metoprolol and "felt better."  Pt states he took his Metoprolol Succinate 50mg . Recently was changed from Lopressor to Toprol XL.   Asked pt to take BP/HR now on the phone, pt states he does not remember where his BP machine is located in home.  Pt denies dizziness, shortness of breath, chest pain now. States he feels stable at this time, "only more tired than usual."  Telephone note form 6/1 with 8/1, PA: Recommendations: -Stop Lopressor -Can try Toprol XL 50 mg QHS -If he does not tolerate Toprol, the next option would be Coreg, last resort would be digoxin for rate control as I would like to have him on a beta blocker for his cardiomyopathy -He needs to see his PCP for evaluation of cognitive function as I do think he has memory loss. He can also see them for his remaining noncardiac concerns   Advised pt if he develops dizziness, SOB, chest pain, or becomes unstable to call 911.  Otherwise, notified pt I will make provider aware of his c/o incr fatigue and episode of feeling disoriented since changing to Toprol XL.   Pt has follow up scheduled with R. Eula Listen, PA 07/06/20.

## 2020-06-27 NOTE — Telephone Encounter (Signed)
The reason we took him off metoprolol, at his request, was due to reported intolerance of medications with issues that had been documented prior to starting this medication (such as ankle pain for example). He needs this medication for multiple reasons including rate control for his Afib, management of his heart failure, and blood pressure. I will leave the decision of taking metoprolol or not to him. Again, he needs to see his PCP for underlying memory deficit. Agree with ED recommendation.

## 2020-06-28 ENCOUNTER — Telehealth: Payer: Self-pay | Admitting: Cardiovascular Disease

## 2020-06-28 NOTE — Telephone Encounter (Signed)
Patient calling with med update states Metoprolol succinate is working well

## 2020-06-28 NOTE — Telephone Encounter (Signed)
Noted, Metoprolol succinate is working well for pt's BP

## 2020-06-29 NOTE — Telephone Encounter (Signed)
Spoke with patient and reviewed provider reasons for continuing medication. He has continued medication and is feeling much better. He reports memory is better and feeling calmer. Confirmed upcoming appointment next week as well as time. He then placed me on hold for extended amount of time and was not talking I was not sure why or what he was doing. Then he asked day and time again for appointment. He sounded as if he was writing down the information and still had date wrong so repeated appointment date and time again. Inquired if he had any further questions and then stated he has 3 things going on, medications, enlarged prostate, and nutritional which he feels is causing the symptoms as well. Advised I would send this information to provider and we will see him next week for his appointment. Will also route this message to his primary care provider with documentation requesting further evaluation of mental status as well as memory deficits.

## 2020-06-29 NOTE — Telephone Encounter (Signed)
Noted. Glad he is feeling better and back on metoprolol with noted improvement in symptoms. Suspect metoprolol was not contributing as previously noted. Will assess him from a cardiac perspective at follow up. Recommend he follow up with his PCP for all noncardiac concerns.

## 2020-06-30 ENCOUNTER — Other Ambulatory Visit: Payer: Self-pay | Admitting: Physician Assistant

## 2020-06-30 NOTE — Progress Notes (Deleted)
Cardiology Office Note    Date:  06/30/2020   ID:  Gary, Dawson [birth year], MRN [id number]  PCP:  Gracelyn Nurse, MD  Cardiologist:  Julien Nordmann, MD  Electrophysiologist:  None   Chief Complaint: Follow-up  History of Present Illness:   Gary Dawson is a 80 y.o. male with history of HFrEF, permanent A. fib on Xarelto, CVA in 06/2008, HTN, HLD, cognitive decline, OSA on CPAP, and BPH who presents for follow up of his cardiomyopathy.   His history of A. fib dates back to approximately 2008.  He was admitted with a CVA involving the left posterior parietal lobe in 06/2008 with echo at that time showing EF greater than 55%, mildly dilated left atrium, mild LVH, mild to moderate TR, and mild to moderate mitral regurgitation.  Carotid artery ultrasound showed no significant plaque.  Treadmill stress echo in 03/2012 showed no wall motion abnormalities concerning for ischemia with good exercise tolerance.   He was seen in 10/2014 for chest pain with subsequent nuclear stress test showing a medium defect of severe severity present in the mid inferoseptal, mid inferior, and apical inferior location consistent with prior MI and peri-infarct ischemia with an EF of 30 to 44%.  Overall, this was an intermediate risk study.  He was seen in follow-up later that month by primary cardiologist and was feeling better.  No further testing ordered.   He was seen in the office in 11/2019 and noted to be in A. fib with RVR with ventricular 133 bpm and BP 130/80.  He had not yet taken his diltiazem that morning.  No changes were made.   More recently, in 03/2020 he fell 3 times in 1 day which he attributed to instability of his right ankle.  He was seen in the ED with head CT being nonacute.  Following this fall he continued to note dizziness, fatigue, and nausea.  He contacted our office in early 04/2020 noting some swelling in his feet and legs.  He reported his BP was well controlled at  that time.  His primary cardiologist recommended Lasix 20 mg 2-3 times per week with KCl as well as leg elevation and compression stockings.  Patient contacted our office on 4/6 noting a BP reading of 142/104 on 4/5 and in the setting appointment was scheduled.   He was seen in the office on 04/22/2020 at the request of his physical therapist, who was evaluating his right ankle pain, to discuss elevated BP reading.  At that time, he was noted to be volume overloaded with lower extremity edema, abdominal distention, and a weight trend that was up 17 pounds when compared to his visit in 11/2019.  It remained uncertain if he truly had a cardiomyopathy, as MPI in 2016 showed an EF of 30 to 40%, and it was uncertain if this was falsely reduced.  We could not exclude some of his lower extremity swelling being exacerbated by calcium channel blocker usage.  In this setting, he was started on Lasix 40 mg daily for 3 days followed by 20 mg daily thereafter.  His diltiazem was discontinued and he was transitioned to metoprolol.  He was seen in the office on 04/28/2020 for close follow-up following the initiation of Lasix with noted improvement in dyspnea, lower extremity swelling, dizziness, appetite, and overall functional status.  His weight was down 7 pounds when compared to his weight on 4/7.  His BP and ventricular rates were improved.  He was continued  on Lasix 20 mg daily with KCl 10 mEq daily.  It was noted he was taking Lopressor 50 mg once daily and he was advised to start taking this twice daily.   He was last seen in the office on 05/17/2020 and continued to feel like he was improving following diuresis and improved rate control.  His weight was stable.  He subsequently underwent previously scheduled echo on 05/18/2020 which showed an EF of 45%, global hypokinesis, moderately reduced RV systolic function with mildly enlarged RV cavity size, mildly elevated PASP, moderately dilated left atrium, severely dilated right  atrium, degenerative mitral valve with trivial regurgitation and moderate mitral annular calcification, severe tricuspid regurgitation, mild aortic valve insufficiency, mild to moderate aortic valve sclerosis without evidence of stenosis, dilated aortic root and ascending aorta, estimated right atrial pressure of 15 mmHg, and evidence of an ASD with predominantly left to right shunting across the atrial septum.  In this setting, it was recommended his Lasix be doubled to 40 mg daily and KCl be increased to 20 mEq daily.  He was last seen in the office on 05/28/2020 and continued to do well from a cardiac perspective.  He reported continued improvement in his overall functional status and lower extremity swelling.  He did not escalate furosemide as outlined above and remained on Lasix 20 mg daily.  His weight was down 4 pounds when compared to his visit earlier that month.  Lopressor was titrated to 75 mg twice daily for added ventricular rate control.  He was scheduled for a R/LHC to evaluate his cardiomyopathy.  He subsequently canceled this cath.  Since I last saw him there have been several phone calls concerning for cognitive decline with initial patient reported intolerance to metoprolol followed by subsequent noted improvement in symptoms with this medication.  He has been advised several times to schedule an appointment with his PCP for further evaluation.  Documentation of these calls has also been forwarded to his PCP.  ***       Labs independently reviewed: 05/2020 - Hgb 12.3, PLT 203, potassium 4.1, BUN 32, serum creatinine 0.82 03/2020 - albumin 4.0, AST/ALT normal 01/2020 - A1c 5.6 10/2019 - TC 123, TG 42, HDL 50, LDL 64, TSH normal    Past Medical History:  Diagnosis Date   Atypical chest pain    a. 03/2012 St echo: nl EF, no wma's.   CVA (cerebral vascular accident) (HCC)    Depression    Dysrhythmia    Elevated PSA    Essential hypertension    Hyperlipidemia    MVP (mitral  valve prolapse)    Persistent atrial fibrillation (HCC)    a. CHA2DS2VASc = 4-->chronic Xarelto.   Psoriasis    Reflux    Sleep apnea    Vitamin B 12 deficiency     Past Surgical History:  Procedure Laterality Date   COLONOSCOPY WITH PROPOFOL     CYSTOSCOPY WITH LITHOLAPAXY N/A 11/01/2018   Procedure: CYSTOSCOPY WITH LITHOLAPAXY;  Surgeon: [name], MD;  Location: [facility] ORS;  Service: Urology;  Laterality: N/A;   HERNIA REPAIR     HOLEP-LASER ENUCLEATION OF THE PROSTATE WITH MORCELLATION N/A 11/01/2018   Procedure: HOLEP-LASER ENUCLEATION OF THE PROSTATE WITH MORCELLATION;  Surgeon: [name], MD;  Location: [facility] ORS;  Service: Urology;  Laterality: N/A;   TONSILLECTOMY     VARICOSE VEIN SURGERY      Current Medications: No outpatient medications have been marked as taking for the 07/06/20 encounter (Appointment)  with Sondra Barges, PA-C.    Allergies:   Other and Lactase   Social History   Socioeconomic History   Marital status: Divorced    Spouse name: Not on file   Number of children: Not on file   Years of education: Not on file   Highest education level: Not on file  Occupational History   Not on file  Tobacco Use   Smoking status: Never   Smokeless tobacco: Never  Vaping Use   Vaping Use: Never used  Substance and Sexual Activity   Alcohol use: Yes    Alcohol/week: 1.0 standard drink    Types: 1 Glasses of wine per week    Comment: social    Drug use: No   Sexual activity: Not Currently  Other Topics Concern   Not on file  Social History Narrative   Not on file   Social Determinants of Health   Financial Resource Strain: Not on file  Food Insecurity: Not on file  Transportation Needs: Not on file  Physical Activity: Not on file  Stress: Not on file  Social Connections: Not on file     Family History:  The patient's family history includes Heart attack in his father; Hyperlipidemia in his father; Hypertension in his father.  ROS:    ROS   EKGs/Labs/Other Studies Reviewed:    Studies reviewed were summarized above. The additional studies were reviewed today:  Stress echo 03/2012 [facility]): Normal treadmill EKG without evidence of ischemia or dysrhythmia, excellent exercise tolerance for age, normal stress echocardiographic images without evidence of ischemia, mild TR, mild MR. __________   Nuclear stress test 10/2014: There was no ST segment deviation noted during stress. Defect 1: There is a medium defect of severe severity present in the mid inferoseptal, mid inferior and apical inferior location. Findings consistent with prior myocardial infarction with peri-infarct ischemia. This is an intermediate risk study. The left ventricular ejection fraction is moderately decreased (30-44%). __________   2D echo 05/18/2020: 1. Left ventricular ejection fraction, by estimation, is 45%. The left  ventricle has mild to moderately decreased function. The left ventricle  demonstrates global hypokinesis. There is mild left ventricular  hypertrophy. Left ventricular diastolic  parameters are indeterminate.   2. Right ventricular systolic function is moderately reduced. The right  ventricular size is mildly enlarged. There is mildly elevated pulmonary  artery systolic pressure.   3. Left atrial size was moderately dilated.   4. Right atrial size was severely dilated.   5. The mitral valve is degenerative. Trivial mitral valve regurgitation.  Moderate mitral annular calcification.   6. Tricuspid valve regurgitation is severe.   7. The aortic valve is tricuspid. There is moderate thickening of the  aortic valve. Aortic valve regurgitation is mild. Mild to moderate aortic  valve sclerosis/calcification is present, without any evidence of aortic  stenosis.   8. Aortic dilatation noted. There is mild dilatation of the aortic root  and of the ascending aorta, measuring 42 mm.   9. The inferior vena cava is dilated  in size with <50% respiratory  variability, suggesting right atrial pressure of 15 mmHg.  10. Evidence of atrial level shunting detected by color flow Doppler.  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   EKG:  EKG is ordered today.  The EKG ordered today demonstrates ***  Recent Labs: 03/19/2020: ALT 35 05/28/2020: BUN 32; Creatinine, Ser 0.82; Hemoglobin 12.3; Platelets 203; Potassium  4.1; Sodium 137  Recent Lipid Panel No results found for: CHOL, TRIG, HDL, CHOLHDL, VLDL, LDLCALC, LDLDIRECT  PHYSICAL EXAM:    VS:  There were no vitals taken for this visit.  BMI: There is no height or weight on file to calculate BMI.  Physical Exam  Wt Readings from Last 3 Encounters:  05/28/20 182 lb (82.6 kg)  05/17/20 186 lb (84.4 kg)  05/11/20 183 lb (83 kg)     ASSESSMENT & PLAN:   HFrEF:  Permanent A. fib:  ASD:  Dilated aortic root/ascending aorta:  HTN: Blood pressure ***  HLD: LDL of 64 in 10/2019.  Cognitive decline:  History of CVA:  OSA:  Disposition: F/u with Dr. Mariah Milling or an APP in ***.   Medication Adjustments/Labs and Tests Ordered: Current medicines are reviewed at length with the patient today.  Concerns regarding medicines are outlined above. Medication changes, Labs and Tests ordered today are summarized above and listed in the Patient Instructions accessible in Encounters.   Signed, Eula Listen, PA-C 06/30/2020 11:17 AM     [facility] [place] [address] [place], [place] [address] [phone]

## 2020-07-01 ENCOUNTER — Other Ambulatory Visit: Payer: Self-pay

## 2020-07-01 MED ORDER — FUROSEMIDE 20 MG PO TABS: 20.0000 mg | ORAL_TABLET | Freq: Every day | ORAL | 0 refills | Status: DC

## 2020-07-05 ENCOUNTER — Other Ambulatory Visit: Payer: Self-pay | Admitting: Cardiovascular Disease

## 2020-07-05 NOTE — Telephone Encounter (Signed)
Pt's age 80, wt 82.6, SCr 0.82,. CrCl 86.74, last ov w/ RD 05/28/20.

## 2020-07-05 NOTE — Telephone Encounter (Signed)
Refill Request.  

## 2020-07-06 ENCOUNTER — Ambulatory Visit: Payer: Medicare PPO | Admitting: Physician Assistant

## 2020-07-06 ENCOUNTER — Other Ambulatory Visit: Payer: Self-pay

## 2020-07-06 ENCOUNTER — Encounter: Payer: Self-pay | Admitting: Urology

## 2020-07-06 ENCOUNTER — Ambulatory Visit: Payer: Medicare PPO | Admitting: Urology

## 2020-07-06 ENCOUNTER — Other Ambulatory Visit: Payer: Self-pay | Admitting: *Deleted

## 2020-07-06 ENCOUNTER — Other Ambulatory Visit
Admission: RE | Admit: 2020-07-06 | Discharge: 2020-07-06 | Disposition: A | Discharge status: Home / Self Care | Payer: Medicare PPO | Attending: Urology | Admitting: Urology

## 2020-07-06 VITALS — BP 128/84 | HR 76 | Ht 73.0 in | Wt 179.0 lb

## 2020-07-06 DIAGNOSIS — N401 Enlarged prostate with lower urinary tract symptoms: Secondary | ICD-10-CM

## 2020-07-06 DIAGNOSIS — N138 Other obstructive and reflux uropathy: Secondary | ICD-10-CM

## 2020-07-06 DIAGNOSIS — N3281 Overactive bladder: Secondary | ICD-10-CM

## 2020-07-06 LAB — URINALYSIS, COMPLETE (UACMP) WITH MICROSCOPIC
Bilirubin Urine: NEGATIVE
Glucose, UA: NEGATIVE mg/dL
Ketones, ur: NEGATIVE mg/dL
Leukocytes,Ua: NEGATIVE
Nitrite: NEGATIVE
Protein, ur: NEGATIVE mg/dL
Specific Gravity, Urine: 1.02 (ref 1.005–1.030)
pH: 5 (ref 5.0–8.0)

## 2020-07-06 LAB — BLADDER SCAN AMB NON-IMAGING

## 2020-07-06 MED ORDER — VIBEGRON 75 MG PO TABS: 75.0000 mg | ORAL_TABLET | Freq: Every day | ORAL | 11 refills | Status: DC

## 2020-07-06 NOTE — Progress Notes (Signed)
   07/06/2020 1:47 PM   Gary Dawson 10/26/1940 [id number]   Reason for visit: Follow up BPH/OAB, nocturia   HPI: I saw Gary Dawson in urology clinic today for persistent overactive urinary symptoms.  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  He had bothersome overactive symptoms as expected for the first few months postoperatively, but at our visit in August 2021 really was doing quite well.  He was not wearing a pad or having any leakage at that time, and had stopped drinking green and flavored teas and switched over to water and his urinary symptoms had improved significantly.  He also has a fair amount of GI symptoms with gas and constipation as well that he tends to fixate on. He does think that his urinary and bowel symptoms are tied together.  There are multiple notes in the chart from other providers regarding his confusion, and I do think that is playing a role.  He may have a neurologic component of his overactive urinary symptoms as well.  He is a very challenging historian.  At our last visit, his main issue was nocturia with leakage overnight as well as urgency and some frequency during the day with urge incontinence.  We focused on 3 things at that visit including taking Lasix in the morning instead of before bed, a trial of Myrbetriq 50 mg daily and a month of samples were given, and compliance with CPAP for nocturia.  He change the Lasix to the morning, felt like he had improvement on the Myrbetriq with the urgency and frequency as well as leakage but never filled the medication moving forward after finishing the samples, and has not resumed using his CPAP machine.  Primary issue continues to be frequency every 1-2 hours during the day, with some urge incontinence as well as  incontinence overnight requiring a depends.  PVR is normal again today at 61 mL.  He is drinking primarily lemon water during the day, and occasionally some tea.  We had another long conversation today about his overactive symptoms.  I recommended a repeat trial of Myrbetriq, or Vibegron the newest beta 3 agonist.  He would not be a candidate for anticholinergics secondary to his frailty/confusion, as well as problems in the past with constipation.  I have provided another 1 month of Myrbetriq 50 mg samples, and I started the prior authorization process for vibegron.  Both of these medications have a very low to minimal side effect profile.   We will try to get approval for vibegron on daily, if unable can use Myrbetriq Behavioral strategies discussed extensively RTC 1 year symptom check   [name], MD  [facility] [address], [address] [place], [place] [address] [phone]

## 2020-07-06 NOTE — Patient Instructions (Signed)
Consider using your CPAP machine overnight, as this can help with overnight urination  We will send you with samples of the Myrbetriq to try as we are out of samples of Vibegron.  [facility] will call you if we are able to get approval for the Vibegron

## 2020-07-09 ENCOUNTER — Other Ambulatory Visit: Payer: Self-pay | Admitting: Physician Assistant

## 2020-07-09 ENCOUNTER — Telehealth: Payer: Self-pay | Admitting: Cardiovascular Disease

## 2020-07-09 MED ORDER — FUROSEMIDE 20 MG PO TABS: 20.0000 mg | ORAL_TABLET | Freq: Every day | ORAL | 0 refills | Status: DC

## 2020-07-09 NOTE — Telephone Encounter (Signed)
Pt c/o medication issue:  1. Name of Medication: metoprolol succinate  2. How are you currently taking this medication (dosage and times per day)?   20 mg po QHS  3. Are you having a reaction (difficulty breathing--STAT)? no  4. What is your medication issue? Patient states succinate is working well .  He was recently rescheduled due to provider scheduled change and was supposed to discuss plans to continue or change med.   Patient is going to run out before fu was rescheduled.   Please call to advise on med plan and also POC for proposed stent procedure.

## 2020-07-09 NOTE — Telephone Encounter (Signed)
Spoke with patient and he has not received the mail order shipment for his furosemide. Sent in refill to local pharmacy and confirmed how he is taking medication. Also confirmed upcoming appointment with him and he verbalized understanding with no further questions.

## 2020-07-09 NOTE — Telephone Encounter (Signed)
Left voicemail message to call back for review.  

## 2020-07-09 NOTE — Telephone Encounter (Signed)
Patient returning ca

## 2020-07-12 LAB — MISC LABCORP TEST (SEND OUT): Labcorp test code: 86884

## 2020-08-11 ENCOUNTER — Encounter: Payer: Self-pay | Admitting: Cardiovascular Disease

## 2020-08-11 ENCOUNTER — Other Ambulatory Visit: Payer: Self-pay

## 2020-08-11 ENCOUNTER — Ambulatory Visit: Payer: Medicare PPO | Admitting: Cardiovascular Disease

## 2020-08-11 VITALS — BP 140/78 | HR 103 | Ht 73.0 in | Wt 177.2 lb

## 2020-08-11 DIAGNOSIS — I77810 Thoracic aortic ectasia: Secondary | ICD-10-CM

## 2020-08-11 DIAGNOSIS — I10 Essential (primary) hypertension: Secondary | ICD-10-CM

## 2020-08-11 DIAGNOSIS — I4821 Permanent atrial fibrillation: Secondary | ICD-10-CM | POA: Diagnosis not present

## 2020-08-11 DIAGNOSIS — I429 Cardiomyopathy, unspecified: Secondary | ICD-10-CM | POA: Diagnosis not present

## 2020-08-11 MED ORDER — METOPROLOL SUCCINATE ER 50 MG PO TB24: ORAL_TABLET | ORAL | 6 refills | Status: DC

## 2020-08-11 NOTE — Progress Notes (Addendum)
Cardiology Office Note  Date:  08/11/2020   ID:  Cicero, Noy 1940/02/03, MRN [id number]  PCP:  Gracelyn Nurse, MD   Chief Complaint  Patient presents with   Other    OD 2 wk f/u no complaints today. Meds reviewed verbally with pt.    HPI:  Mr. Kiang is a  80 year old gentleman with  long history of atrial fibrillation dating back to at least 2008,  prior small stroke in June 2010,  hyperlipidemia,  OSA, on CPAP who presents  for routine followup of his permanent atrial fibrillation, cardiomyopathy  LOV with myself 11/2019  Presented to the office May 2022  with one of our providers with hypervolemia Echocardiogram May 2022 ejection fraction 45% Moderately reduced RV function Moderately dilated left atrium, severely dilated right atrium  office in 11/2019 and noted to be in A. fib with RVR with ventricular 133 bpm On Xarelto  There had been discussion of right and left heart catheterization May 2022, it would appear he did not undergo the procedure Significant confusion concerning this procedure  In follow-up today, reports that his Weight stable Trace LE edema left >right Has a sleep number bed Did not realize he is on lasix,  Has prostate issues, followed by urology Incontinence  Some confusion concerning medications, recent details concerning his health Active with no regular exercise  Lab work reviewed with him HBA1C 5.9 CBC normal,  Total chol 135, LDL 71  EKG personally reviewed by myself on todays visit Atrial fibrillation rate 103 bpm nonspecific T wave  Other past medical history He had a stress test, perfusion Myoview. This showed small region of perfusion defect, predominantly fixed in the inferoapical region. Low ejection fraction 35-40%, suspect secondary to conduction abnormality    in atrial fibrillation in 2008 on EKG, normal sinus rhythm in June 2010, EKG February 2014 showing atrial fibrillation   Treadmill stress echo  March 2014 showed no wall motion abnormality concerning for ischemia, good exercise tolerance, peak heart rate of 214 beats per minute ? With peak systolic pressure 191/96. Resting heart rate 98 beats per minute.    Echocardiogram June 2010 showing ejection fraction greater than 55%, mildly dilated left atrium, mild LVH, mild to moderate TR, mild to moderate MR Carotid ultrasound June 2010 showing no significant plaque MRI of the brain June 2010 showing tiny nonhemorrhagic left posterior parietal lobe infarct  PMH:   has a past medical history of Atypical chest pain, CVA (cerebral vascular accident) (HCC), Depression, Dysrhythmia, Elevated PSA, Essential hypertension, Hyperlipidemia, MVP (mitral valve prolapse), Persistent atrial fibrillation (HCC), Psoriasis, Reflux, Sleep apnea, and Vitamin B 12 deficiency.  PSH:    Past Surgical History:  Procedure Laterality Date   COLONOSCOPY WITH PROPOFOL     CYSTOSCOPY WITH LITHOLAPAXY N/A 11/01/2018   Procedure: CYSTOSCOPY WITH LITHOLAPAXY;  Surgeon: [name], MD;  Location: [facility] ORS;  Service: Urology;  Laterality: N/A;   HERNIA REPAIR     HOLEP-LASER ENUCLEATION OF THE PROSTATE WITH MORCELLATION N/A 11/01/2018   Procedure: HOLEP-LASER ENUCLEATION OF THE PROSTATE WITH MORCELLATION;  Surgeon: [name], MD;  Location: [facility];  Service: Urology;  Laterality: N/A;   TONSILLECTOMY     VARICOSE VEIN SURGERY      Current Outpatient Medications  Medication Sig Dispense Refill   CYANOCOBALAMIN ER PO Take 2,000 mcg by mouth daily.     furosemide (LASIX) 20 MG tablet Take 1 tablet (20 mg total) by mouth daily. 90 tablet 0  hydroxypropyl methylcellulose / hypromellose (ISOPTO TEARS / GONIOVISC) 2.5 % ophthalmic solution Place 1 drop into both eyes 2 (two) times daily as needed for dry eyes.     ibuprofen (ADVIL) 200 MG tablet Take 200 mg by mouth as needed.     metoprolol succinate (TOPROL-XL) 50 MG 24 hr tablet TAKE 1 TABLET (50 MG TOTAL)  BY MOUTH AT BEDTIME. TAKE WITH OR IMMEDIATELY FOLLOWING A MEAL. 30 tablet 0   potassium chloride (KLOR-CON) 10 MEQ tablet Take 1 tablet (10 mEq total) by mouth daily. 90 tablet 3   Vibegron 75 MG TABS Take 75 mg by mouth daily. 30 tablet 11   VITAMIN D, ERGOCALCIFEROL, PO Take 1,000 Int'l Units by mouth daily.     XARELTO 20 MG TABS tablet TAKE 1 TABLET EVERY DAY WITH SUPPER 90 tablet 1   No current facility-administered medications for this visit.    Allergies:   Other and Lactase   Social History:  The patient  reports that he has never smoked. He has never used smokeless tobacco. He reports current alcohol use of about 1.0 standard drink of alcohol per week. He reports that he does not use drugs.   Family History:   family history includes Heart attack in his father; Hyperlipidemia in his father; Hypertension in his father.    Review of Systems: Review of Systems  Constitutional: Negative.   Respiratory: Negative.    Cardiovascular: Negative.   Gastrointestinal: Negative.   Musculoskeletal: Negative.   Neurological:  Positive for dizziness.       Balance problems  Psychiatric/Behavioral: Negative.    All other systems reviewed and are negative.   PHYSICAL EXAM: VS:  BP 140/78 (BP Location: Left Arm, Patient Position: Sitting, Cuff Size: Normal)   Pulse (!) 103   Ht 6\' 1"  (1.854 m)   Wt 177 lb 4 oz (80.4 kg)   SpO2 96%   BMI 23.39 kg/m  , BMI Body mass index is 23.39 kg/m.  Constitutional:  oriented to person, place, and time. No distress.  HENT:  Head: Grossly normal Eyes:  no discharge. No scleral icterus.  Neck: No JVD, no carotid bruits  Cardiovascular: Irreg irreg, , no murmurs appreciated Pulmonary/Chest: Clear to auscultation bilaterally, no wheezes or rails Abdominal: Soft.  no distension.  no tenderness.  Musculoskeletal: Normal range of motion Neurological:  normal muscle tone. Coordination normal. 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) No recent TIA or stroke symptoms On Xarelto, stable  Essential hypertension Increased dose of metoprolol as above  Mixed hyperlipidemia Weight low but stable over the past year, currently not on a statin  Chronic diastolic and systolic CHF Appears relatively euvolemic, continue Lasix 20 mg daily Renal function stable 9 days ago  Cardiomyopathy Mildly depressed ejection fraction suspected secondary to underlying atrial fibrillation He is confused concerning cardiac catheterization, suspect memory issues Will order pharmacologic Myoview, he  is in agreement  Shortness of breath Stable symptoms, done better on Lasix 20 daily Increased dose metoprolol for rate control  OSA, on CPAP Stressed the importance of staying on his CPAP   Total encounter time more than 25 minutes  Greater than 50% was spent in counseling and coordination of care with the patient    Orders Placed This Encounter  Procedures   EKG 12-Lead     Signed, Dossie Arbour, M.D., Ph.D. 08/11/2020  [facility] [place], [place] [phone]

## 2020-08-11 NOTE — Patient Instructions (Addendum)
Medication Instructions:  - Your physician has recommended you make the following change in your medication:   1) INCREASE metoprolol succinate 50 mg- take 1.5 tablets (75 mg) by mouth once daily    If you need a refill on your cardiac medications before your next appointment, please call your pharmacy.    Lab work: No new labs needed   If you have labs (blood work) drawn today and your tests are completely normal, you will receive your results only by: MyChart Message (if you have MyChart) OR A paper copy in the mail If you have any lab test that is abnormal or we need to change your treatment, we will call you to review the results.   Testing/Procedures:  - Your physician has requested that you have a lexiscan myoview.   [facility] MYOVIEW (Cardiac Nuclear Scan)  Your caregiver has ordered a Stress Test with nuclear imaging. The purpose of this test is to evaluate the blood supply to your heart muscle. This procedure is referred to as a "Non-Invasive Stress Test." This is because other than having an IV started in your vein, nothing is inserted or "invades" your body. Cardiac stress tests are done to find areas of poor blood flow to the heart by determining the extent of coronary artery disease (CAD). Some patients exercise on a treadmill, which naturally increases the blood flow to your heart, while others who are  unable to walk on a treadmill due to physical limitations have a pharmacologic/chemical stress agent called Lexiscan . This medicine will mimic walking on a treadmill by temporarily increasing your coronary blood flow.   Please note: these test may take anywhere between 2-4 hours to complete  PLEASE REPORT TO [facility]ICAL [facility] ENTRANCE  THE VOLUNTEERS AT THE FIRST DESK WILL DIRECT YOU WHERE TO GO  Date of Procedure:_____________________________________  Arrival Time for Procedure:______________________________  Instructions regarding medication:   __x__ : Hold Lasix  (furosemide) the morning of your test  __x__:  Hold Metoprolol succinate the morning of your test  __x__:  Bonita Quin may take all of your other medications the morning of your test with enough water to get them down safely  PLEASE NOTIFY THE OFFICE AT LEAST 24 HOURS IN ADVANCE IF YOU ARE UNABLE TO KEEP YOUR APPOINTMENT.  [phone] AND  PLEASE NOTIFY NUCLEAR MEDICINE AT [facility] AT LEAST 24 HOURS IN ADVANCE IF YOU ARE UNABLE TO KEEP YOUR APPOINTMENT. [phone]  How to prepare for your Myoview test:  Do not eat or drink after midnight No caffeine for 24 hours prior to test No smoking 24 hours prior to test. Your medication may be taken with water.  If your doctor stopped a medication because of this test, do not take that medication. Ladies, please do not wear dresses.  Skirts or pants are appropriate. Please wear a short sleeve shirt. No perfume, cologne or lotion. Wear comfortable walking shoes. No heels!   Follow-Up: At [facility], you and your health needs are our priority.  As part of our continuing mission to provide you with exceptional heart care, we have created designated Provider Care Teams.  These Care Teams include your primary Cardiologist (physician) and Advanced Practice Providers (APPs -  Physician Assistants and Nurse Practitioners) who all work together to provide you with the care you need, when you need it.  You will need a follow up appointment in 6 months, gollan or Alycia Rossetti, APP ok  Providers on your designated Care Team:   Nicolasa Ducking, NP Alycia Rossetti  Dunn, PA-C Marisue Ivan, PA-C Cadence Fransico Michael, [place]  Any Other Special Instructions Will Be Listed Below (If Applicable).  COVID-19 Vaccine Information can be found at: [email or web address] For questions related to vaccine distribution or appointments, please email [email or web address] or call [phone].      Cardiac Nuclear Scan A cardiac nuclear  scan is a test that is done to check the flow of blood to your heart. It is done when you are resting and when you are exercising. The test looks for problems such as: Not enough blood reaching a portion of the heart. The heart muscle not working as it should. You may need this test if: You have heart disease. You have had lab results that are not normal. You have had heart surgery or a balloon procedure to open up blocked arteries (angioplasty). You have chest pain. You have shortness of breath. In this test, a special dye (tracer) is put into your bloodstream. The tracer will travel to your heart. A camera will then take pictures of your heart to see how the tracer moves through yourheart. This test is usually done at a hospital and takes 2-4 hours. Tell a doctor about: Any allergies you have. All medicines you are taking, including vitamins, herbs, eye drops, creams, and over-the-counter medicines. Any problems you or family members have had with anesthetic medicines. Any blood disorders you have. Any surgeries you have had. Any medical conditions you have. Whether you are pregnant or may be pregnant. What are the risks? Generally, this is a safe test. However, problems may occur, such as: Serious chest pain and heart attack. This is only a risk if the stress portion of the test is done. Rapid heartbeat. A feeling of warmth in your chest. This feeling usually does not last long. Allergic reaction to the tracer. What happens before the test? Ask your doctor about changing or stopping your normal medicines. This is important. Follow instructions from your doctor about what you cannot eat or drink. Remove your jewelry on the day of the test. What happens during the test? An IV tube will be inserted into one of your veins. Your doctor will give you a small amount of tracer through the IV tube. You will wait for 20-40 minutes while the tracer moves through your bloodstream. Your heart  will be monitored with an electrocardiogram (ECG). You will lie down on an exam table. Pictures of your heart will be taken for about 15-20 minutes. You may also have a stress test. For this test, one of these things may be done: You will be asked to exercise on a treadmill or a stationary bike. You will be given medicines that will make your heart work harder. This is done if you are unable to exercise. When blood flow to your heart has peaked, a tracer will again be given through the IV tube. After 20-40 minutes, you will get back on the exam table. More pictures will be taken of your heart. Depending on the tracer that is used, more pictures may need to be taken 3-4 hours later. Your IV tube will be removed when the test is over. The test may vary among doctors and hospitals. What happens after the test? Ask your doctor: Whether you can return to your normal schedule, including diet, activities, and medicines. Whether you should drink more fluids. This will help to remove the tracer from your body. Drink enough fluid to keep your pee (urine) pale yellow. Ask your doctor, or the department that is  doing the test: When will my results be ready? How will I get my results? Summary A cardiac nuclear scan is a test that is done to check the flow of blood to your heart. Tell your doctor whether you are pregnant or may be pregnant. Before the test, ask your doctor about changing or stopping your normal medicines. This is important. Ask your doctor whether you can return to your normal activities. You may be asked to drink more fluids. This information is not intended to replace advice given to you by your health care provider. Make sure you discuss any questions you have with your healthcare provider. Document Revised: 04/24/2018 Document Reviewed: 06/18/2017 Elsevier Patient Education  2022 [employer].

## 2020-08-11 NOTE — Addendum Note (Signed)
Addended by: Antonieta Iba on: 08/11/2020 01:36 PM   Modules accepted: Level of Service

## 2020-08-13 ENCOUNTER — Telehealth: Payer: Self-pay | Admitting: Cardiovascular Disease

## 2020-08-13 NOTE — Telephone Encounter (Signed)
Attempted to call pt on both numbers listed. No answer. Lmtcb.   Pt was seen earlier this week by Dr. Mariah Milling on 08/11/20.  Pt's Metoprolol Succinate was incr (from 50mg  1 tab daily) to 50mg  1.5 tablets daily for 75mg  daily.  At ov BP 140/78 HR 103.

## 2020-08-13 NOTE — Telephone Encounter (Signed)
Pt returned call. Pt reports feeling light-headed and balance issues after incr in Metoprolol. Pt was seen earlier this week by Dr. Mariah Milling on 08/11/20. Pt's Metoprolol Succinate was incr (from 50mg  1 tab daily) to 50mg  1.5 tablets daily for 75mg  daily. At ov BP 140/78 HR 103.  Pt unable to check BP/HR at home, states he cannot get BP cuff to work on his own.  Pt confirms he is staying well hydrated.  States he did have occasional light headedness with previous dose as well, however, s/s are worse now with incr dose.  Pt has already taken 75mg  Metoprolol this AM.  Pt denies shortness of breath, chest pain, or any other associated s/s.  Advised pt to stay well hydrated, change positions slowly, and I will discuss with Dr. re symptoms.  In the meantime, advised pt if he has not heard back from Dr. prior to the weekend, that he may go back to taking 1 tablet (Metoprolol Succinate 50mg ) daily as he was able to tolerate this dose.  Advised pt that if his s/s worsen and or becomes unstable over the weekend to go to the Emergency Room.  Otherwise, will call pt back with Dr. recc.  Pt voiced understanding and has no further questions at this time.

## 2020-08-13 NOTE — Telephone Encounter (Signed)
Pt c/o medication issue:  1. Name of Medication: metoprolol   2. How are you currently taking this medication (dosage and times per day)? Recently increased to 1.5 tablets taken in am before eating  3. Are you having a reaction (difficulty breathing--STAT)?   4. What is your medication issue? Loss of balance  Medication instruction was read to patient about taking with or immediately following a meal. Patient stated he will try that over the weekend to see if there is a difference

## 2020-08-16 MED ORDER — METOPROLOL SUCCINATE ER 50 MG PO TB24: 50.0000 mg | ORAL_TABLET | Freq: Every day | ORAL | 3 refills | Status: DC

## 2020-08-16 NOTE — Telephone Encounter (Signed)
Left detail message on VM okay by DPR, following up from pervious phone encounter regarding metoprolol and p'ts lightheadedness and balance issues, Dr. Mariah Milling advised   "Okay to go back on the lower dose of metoprolol  50 mg, that he was taking previously  TG"  Advised Gary Dawson of medications change to metoprolol decrease from 75 mg daily to 50 mg daily to see if his symptoms improves, advised to call office for any concerns or questions, otherwise will see at next visit.   Med list updated to Toprol-XL 50 mg QD.  Mychart message sent as well

## 2020-08-16 NOTE — Addendum Note (Signed)
Addended by: Maple Hudson on: 08/16/2020 01:52 PM   Modules accepted: Orders

## 2020-08-17 ENCOUNTER — Other Ambulatory Visit: Payer: Self-pay

## 2020-08-17 ENCOUNTER — Ambulatory Visit
Admission: RE | Admit: 2020-08-17 | Discharge: 2020-08-17 | Disposition: A | Discharge status: Home / Self Care | Payer: Medicare PPO | Source: Ambulatory Visit | Attending: Cardiovascular Disease | Admitting: Cardiovascular Disease

## 2020-08-17 DIAGNOSIS — I429 Cardiomyopathy, unspecified: Secondary | ICD-10-CM | POA: Diagnosis present

## 2020-08-17 MED ORDER — REGADENOSON 0.4 MG/5ML IV SOLN
0.4000 mg | Freq: Once | INTRAVENOUS | Status: AC
  Administered 2020-08-17: 0.4 mg via INTRAVENOUS

## 2020-08-17 MED ORDER — TECHNETIUM TC 99M TETROFOSMIN IV KIT
10.0000 | PACK | Freq: Once | INTRAVENOUS | Status: AC | PRN
  Administered 2020-08-17: 9.7 via INTRAVENOUS

## 2020-08-17 MED ORDER — TECHNETIUM TC 99M TETROFOSMIN IV KIT
30.0000 | PACK | Freq: Once | INTRAVENOUS | Status: AC | PRN
  Administered 2020-08-17: 30.28 via INTRAVENOUS

## 2020-08-18 LAB — NM MYOCAR MULTI W/SPECT W/WALL MOTION / EF
Estimated workload: 1 METS
Exercise duration (min): 0 min
Exercise duration (sec): 0 s
LV dias vol: 151 mL (ref 62–150)
LV sys vol: 97 mL
MPHR: 142 {beats}/min
Peak HR: 116 {beats}/min
Percent HR: 81 %
Rest HR: 92 {beats}/min
TID: 0.89

## 2020-08-25 ENCOUNTER — Telehealth: Payer: Self-pay

## 2020-08-25 DIAGNOSIS — I4821 Permanent atrial fibrillation: Secondary | ICD-10-CM

## 2020-08-25 DIAGNOSIS — I639 Cerebral infarction, unspecified: Secondary | ICD-10-CM

## 2020-08-25 DIAGNOSIS — R0602 Shortness of breath: Secondary | ICD-10-CM

## 2020-08-25 DIAGNOSIS — R9439 Abnormal result of other cardiovascular function study: Secondary | ICD-10-CM

## 2020-08-25 MED ORDER — METOPROLOL TARTRATE 50 MG PO TABS: 50.0000 mg | ORAL_TABLET | Freq: Once | ORAL | 0 refills | Status: DC

## 2020-08-25 NOTE — Telephone Encounter (Addendum)
Able to reach pt regarding his recent Myoview (stress test) Dr. Mariah Milling had a chance to review his results and advised   "Stress test,  With fixed defect concerning for prior MI  Partially reversible,  Not sure if cardiac cath nded,  Would recommend cardiac CTA "  Mr. Becherer reports he has had a prior MI many years ago, approx 8-10 years ago, unsure if pt is confused with his prior stroke/TIA from 2010.  Pt agrees to proceed with CTA for further testing an evaluation of prior MI. Instructions reviewed with pt, order placed, advised on allergies and Lopressor 50 mg (HR 103 as noted in EMR)  to take 2 hr prior to procedure incongruent with his Toprol.   Aware [place] from scheduling will call to schedule time and date once approved by insurance. Instructions mailed to pt, address on file current. Mr. Lanum thankful for the phone call, all questions and concerns were address and no additional concerns at this time. Agreeable to plan, will call back for anything further.    CARDIAC CTA [facility] [address] [place], [place] [address] [phone]  Please arrive 15 mins early for check-in and test prep.  Please follow these instructions carefully (unless otherwise directed):  Hold all erectile dysfunction medications at least 3 days (72 hrs) prior to test.  On the Night Before the Test: Be sure to Drink plenty of water. Do not consume any caffeinated/decaffeinated beverages or chocolate 12 hours prior to your test. This includes decaf coffee Do not take any antihistamines 12 hours prior to your test. Such as benadryl    On the Day of the Test: Drink plenty of water until 1 hour prior to the test. Do not eat any food 4 hours prior to the test. You may take your regular medications prior to the test.  Take metoprolol (Lopressor) two hours prior to test. Lopressor 50 mg (sent in to pharmacy) Take along with you Toprol 50 mg HOLD  Furosemide (Lasix) morning of the test. May take after procedure       After the Test: Drink plenty of water. After receiving IV contrast, you may experience a mild flushed feeling. This is normal. On occasion, you may experience a mild rash up to 24 hours after the test. This is not dangerous. If this occurs, you can take Benadryl 25 mg and increase your fluid intake. If you experience trouble breathing, this can be serious. If it is severe call 911 IMMEDIATELY. If it is mild, please call our office. If you take any of these medications: Glipizide/Metformin, Avandament, Glucavance, please do not take 48 hours after completing test unless otherwise instructed.   Once we have confirmed authorization from your insurance company, we will call you to set up a date and time for your test. Based on how quickly your insurance processes prior authorizations requests, please allow up to 4 weeks to be contacted for scheduling your Cardiac CT appointment. Be advised that routine Cardiac CT appointments could be scheduled as many as 8 weeks after your provider has ordered it.   For scheduling needs, including cancellations and rescheduling, please call [place], [phone].

## 2020-09-01 ENCOUNTER — Telehealth: Payer: Self-pay

## 2020-09-01 NOTE — Telephone Encounter (Signed)
Was able to reach pt this am via phone, to advised Dr. Mariah Milling has cancel his CCTA for tomorrow. Per Dr. Mariah Milling  I talked again about this case and he stress test with Dr. Debbe Odea  He feels there is too much calcification in all 3 vessels to get a good scan  Also arrhythmia can lead to significant artifact   Because of this we need to cancel the cardiac CTA  Best option would be either medical management or cardiac catheterization  It would depend on his symptoms of chest pain/angina and whether he is willing to proceed a invasive test  I think we did try to schedule several months ago but for some unclear reason it did not work out   [employer] pt to keep appt in Jan as schedule, if he stats to develop any CP/SOB or other cardiac symptoms, then please call to schedule an appt to be seen sooner to re-discuss a heart cath as the one in May was cancel d/t personal issues. Mr. Strupp verbalized understanding, reports feeling well this am, has had no issues currently. Will call back with any cocnerns.

## 2020-09-02 ENCOUNTER — Ambulatory Visit: Admission: RE | Admit: 2020-09-02 | Payer: Medicare PPO | Source: Ambulatory Visit

## 2020-09-08 ENCOUNTER — Ambulatory Visit: Payer: Self-pay | Admitting: Urology

## 2020-09-13 ENCOUNTER — Other Ambulatory Visit: Payer: Self-pay | Admitting: Physician Assistant

## 2020-10-22 IMAGING — CR DG RIBS W/ CHEST 3+V*R*
6 series · 6 of 6 positions shown · non-contrast
Comparison: Chest radiograph July 03, 2008

CLINICAL DATA: Pain following fall

EXAM:
RIGHT RIBS AND CHEST - 3+ VIEW

[chest pa (1 of 2)]
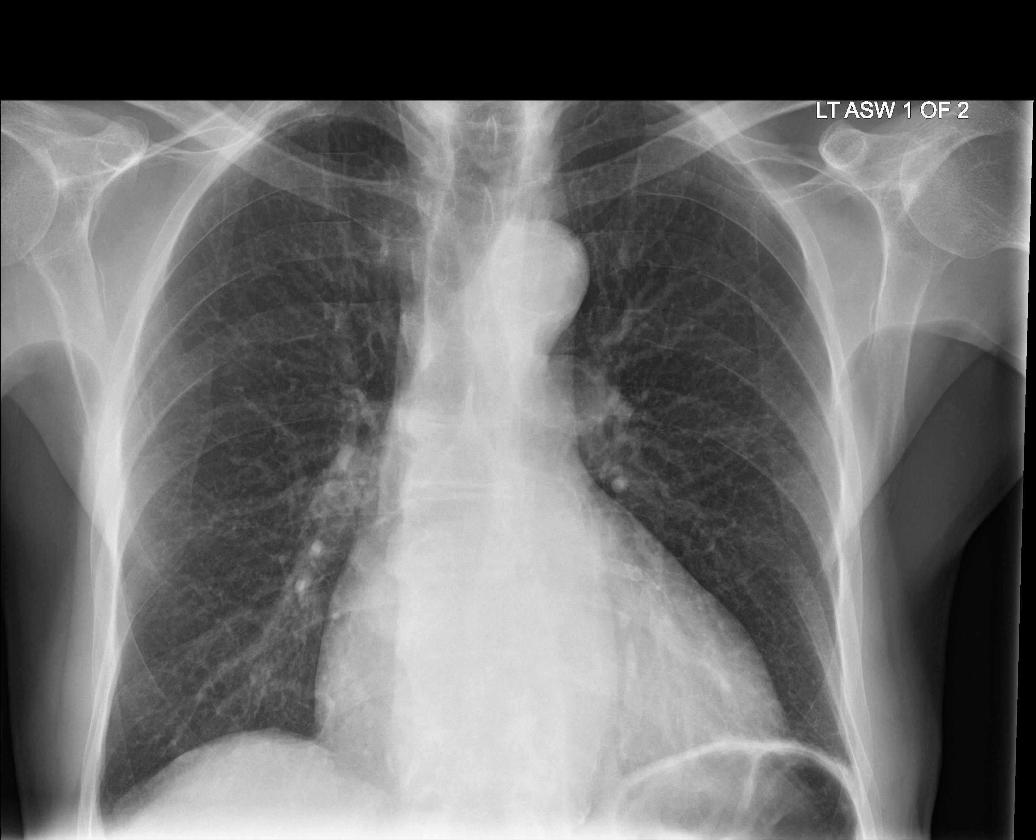

[rib pa (1 of 2)]
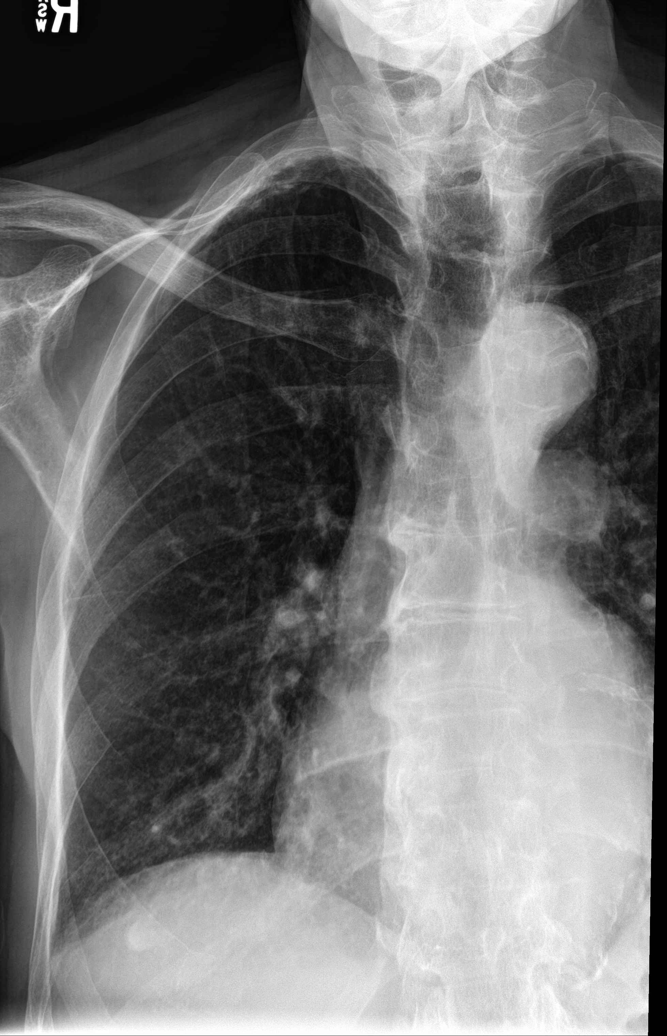

[rib pa (2 of 2)]
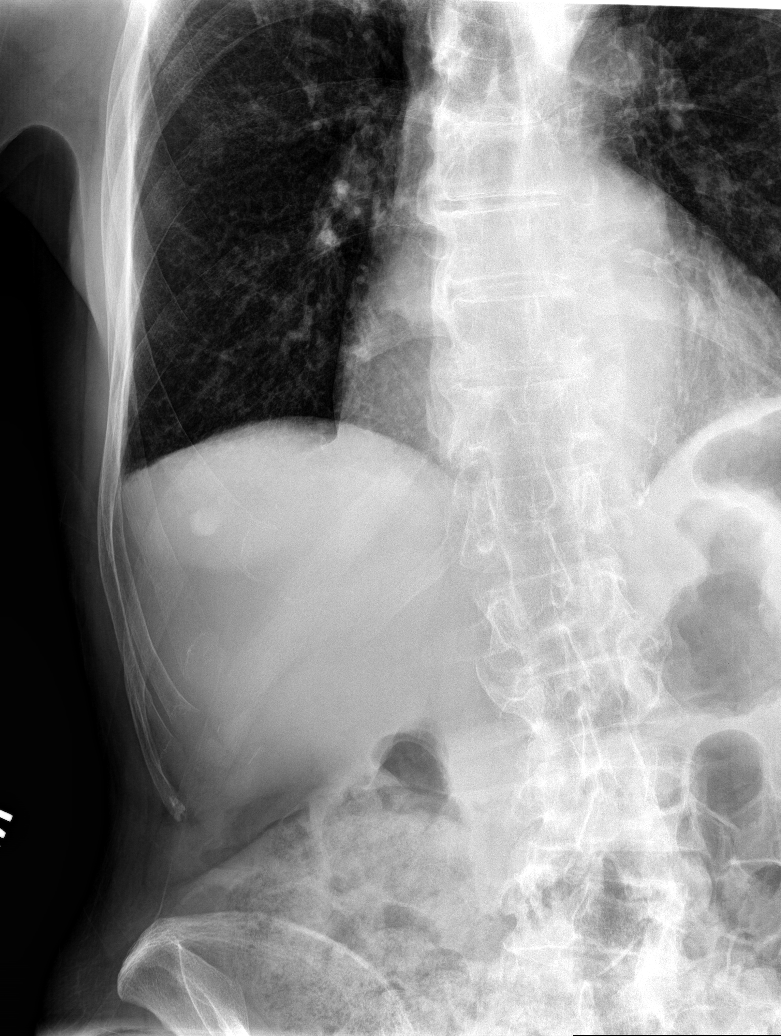

[rib obl (1 of 2)]
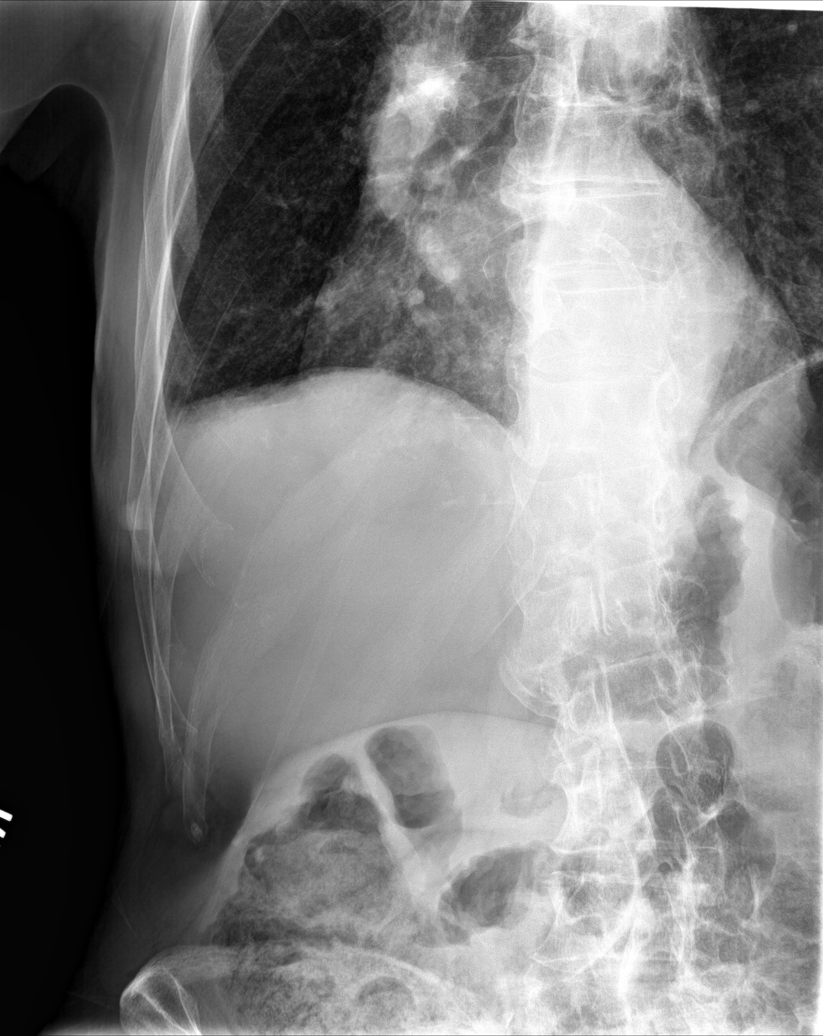

[rib obl (2 of 2)]
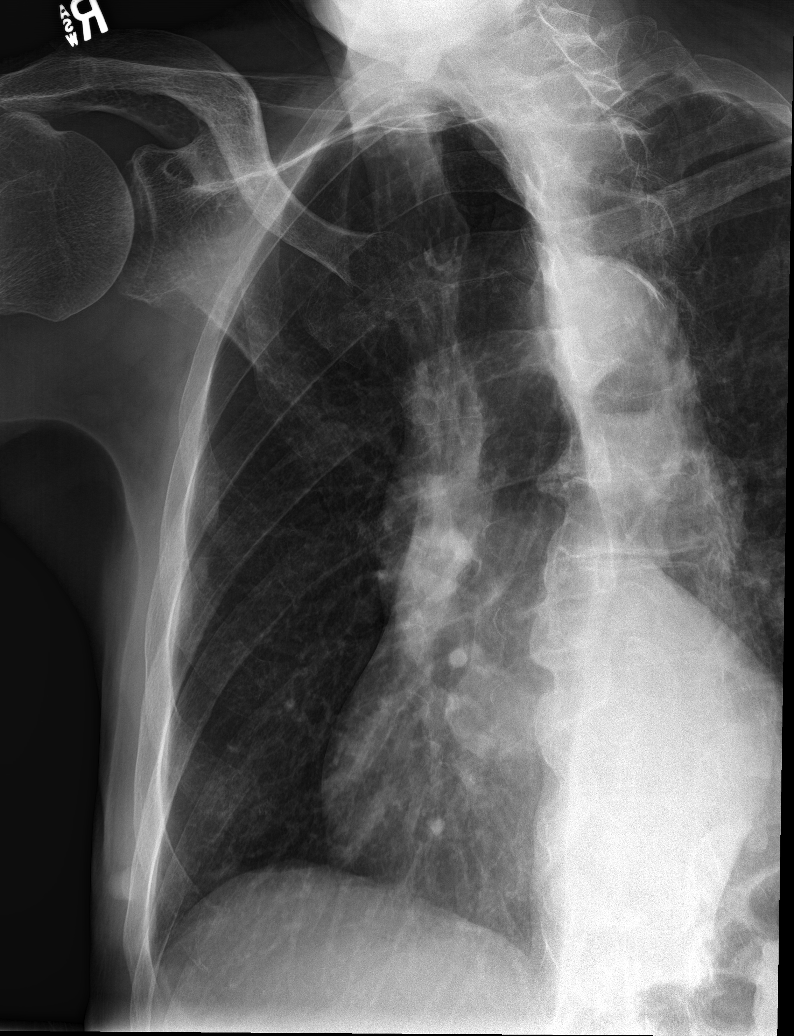

[chest pa (2 of 2)]
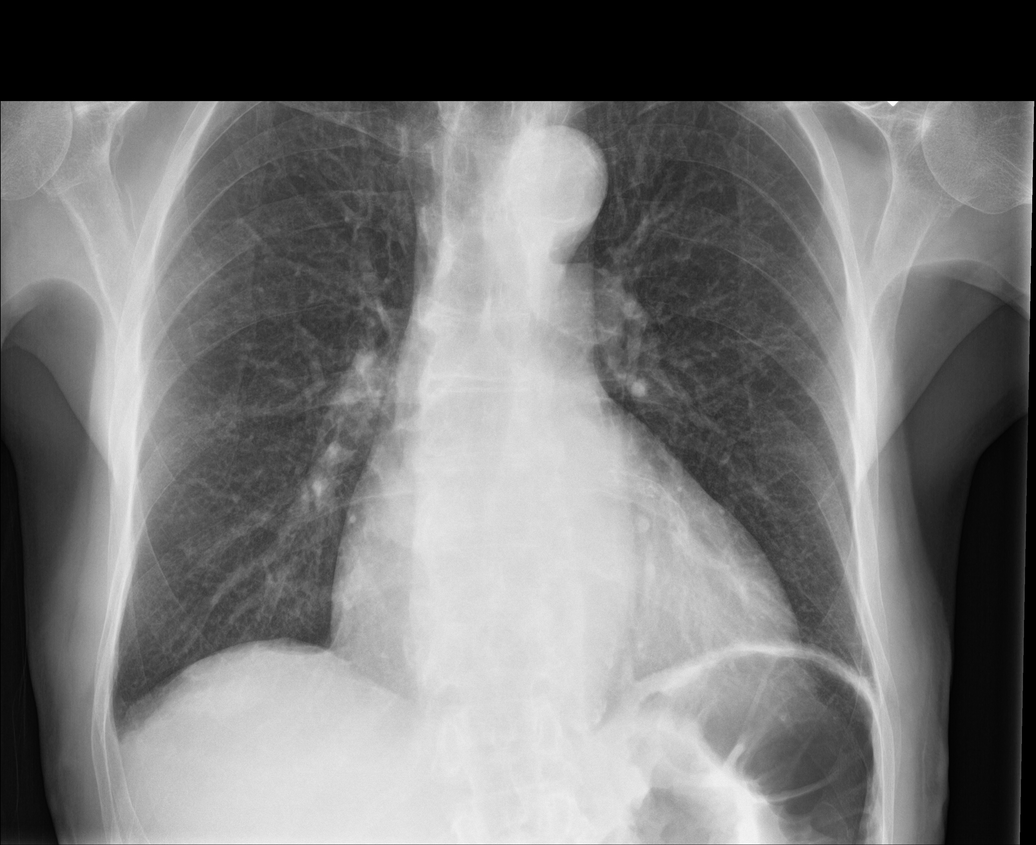

[6 of 6 positions shown; findings below may reference images not displayed]

FINDINGS: Frontal chest as well as oblique and cone-down rib images were
obtained. There is an apparent nipple shadow on the right. Lungs are
clear. Heart size is upper normal with pulmonary vascularity normal.
No adenopathy.

No pneumothorax or pleural effusion.  No evident rib fracture.
IMPRESSION: No evident rib fracture.  Lungs clear.  Heart upper normal in size.

## 2020-10-25 ENCOUNTER — Telehealth: Payer: Self-pay

## 2020-10-25 DIAGNOSIS — N3281 Overactive bladder: Secondary | ICD-10-CM

## 2020-10-25 DIAGNOSIS — R350 Frequency of micturition: Secondary | ICD-10-CM

## 2020-10-25 MED ORDER — VIBEGRON 75 MG PO TABS: 75.0000 mg | ORAL_TABLET | Freq: Every day | ORAL | 11 refills | Status: DC

## 2020-10-25 NOTE — Telephone Encounter (Signed)
Pt calls triage line and requests rx for Gemtesa be sent to mail order pharmacy, RX sent.

## 2021-01-16 ENCOUNTER — Other Ambulatory Visit: Payer: Self-pay | Admitting: Cardiovascular Disease

## 2021-01-31 NOTE — Progress Notes (Deleted)
Cardiology Office Note  Date:  01/31/2021   ID:  Jauan, Wohl Aug 03, 1940, MRN [id number]  PCP:  Gracelyn Nurse, MD   No chief complaint on file.   HPI:  Mr. Shein is a  81 year old gentleman with  long history of atrial fibrillation dating back to at least 2008,  prior small stroke in June 2010,  hyperlipidemia,  OSA, on CPAP who presents  for routine followup of his permanent atrial fibrillation, cardiomyopathy  LOV with myself 11/2019  Presented to the office May 2022  with one of our providers with hypervolemia Echocardiogram May 2022 ejection fraction 45% Moderately reduced RV function Moderately dilated left atrium, severely dilated right atrium  office in 11/2019 and noted to be in A. fib with RVR with ventricular 133 bpm On Xarelto  There had been discussion of right and left heart catheterization May 2022, it would appear he did not undergo the procedure Significant confusion concerning this procedure  In follow-up today, reports that his Weight stable Trace LE edema left >right Has a sleep number bed Did not realize he is on lasix,  Has prostate issues, followed by urology Incontinence  Some confusion concerning medications, recent details concerning his health Active with no regular exercise  Lab work reviewed with him HBA1C 5.9 CBC normal,  Total chol 135, LDL 71  EKG personally reviewed by myself on todays visit Atrial fibrillation rate 103 bpm nonspecific T wave  Other past medical history He had a stress test, perfusion Myoview. This showed small region of perfusion defect, predominantly fixed in the inferoapical region. Low ejection fraction 35-40%, suspect secondary to conduction abnormality    in atrial fibrillation in 2008 on EKG, normal sinus rhythm in June 2010, EKG February 2014 showing atrial fibrillation   Treadmill stress echo March 2014 showed no wall motion abnormality concerning for ischemia, good exercise tolerance,  peak heart rate of 214 beats per minute ? With peak systolic pressure 191/96. Resting heart rate 98 beats per minute.    Echocardiogram June 2010 showing ejection fraction greater than 55%, mildly dilated left atrium, mild LVH, mild to moderate TR, mild to moderate MR Carotid ultrasound June 2010 showing no significant plaque MRI of the brain June 2010 showing tiny nonhemorrhagic left posterior parietal lobe infarct  PMH:   has a past medical history of Atypical chest pain, CVA (cerebral vascular accident) (HCC), Depression, Dysrhythmia, Elevated PSA, Essential hypertension, Hyperlipidemia, MVP (mitral valve prolapse), Persistent atrial fibrillation (HCC), Psoriasis, Reflux, Sleep apnea, and Vitamin B 12 deficiency.  PSH:    Past Surgical History:  Procedure Laterality Date   COLONOSCOPY WITH PROPOFOL     CYSTOSCOPY WITH LITHOLAPAXY N/A 11/01/2018   Procedure: CYSTOSCOPY WITH LITHOLAPAXY;  Surgeon: [name], MD;  Location: [facility];  Service: Urology;  Laterality: N/A;   HERNIA REPAIR     HOLEP-LASER ENUCLEATION OF THE PROSTATE WITH MORCELLATION N/A 11/01/2018   Procedure: HOLEP-LASER ENUCLEATION OF THE PROSTATE WITH MORCELLATION;  Surgeon: [name], MD;  Location: [facility];  Service: Urology;  Laterality: N/A;   TONSILLECTOMY     VARICOSE VEIN SURGERY      Current Outpatient Medications  Medication Sig Dispense Refill   CYANOCOBALAMIN ER PO Take 2,000 mcg by mouth daily.     furosemide (LASIX) 20 MG tablet TAKE 1 TABLET EVERY DAY 90 tablet 3   hydroxypropyl methylcellulose / hypromellose (ISOPTO TEARS / GONIOVISC) 2.5 % ophthalmic solution Place 1 drop into both eyes 2 (two) times daily as  needed for dry eyes.     ibuprofen (ADVIL) 200 MG tablet Take 200 mg by mouth as needed.     metoprolol succinate (TOPROL-XL) 50 MG 24 hr tablet Take 1 tablet (50 mg total) by mouth daily. Take 1.5 tablets (75 mg) by mouth once daily. Take with or immediately following a meal. 90 tablet  3   metoprolol tartrate (LOPRESSOR) 50 MG tablet Take 1 tablet (50 mg total) by mouth once for 1 dose. Take 2 hours prior to your cardiac CTA, take with Toprol 50 mg 1 tablet 0   potassium chloride (KLOR-CON) 10 MEQ tablet Take 1 tablet (10 mEq total) by mouth daily. 90 tablet 3   Vibegron 75 MG TABS Take 75 mg by mouth daily. 30 tablet 11   VITAMIN D, ERGOCALCIFEROL, PO Take 1,000 Int'l Units by mouth daily.     XARELTO 20 MG TABS tablet TAKE 1 TABLET EVERY DAY WITH SUPPER 90 tablet 1   No current facility-administered medications for this visit.    Allergies:   Other and Tilactase   Social History:  The patient  reports that he has never smoked. He has never used smokeless tobacco. He reports current alcohol use of about 1.0 standard drink per week. He reports that he does not use drugs.   Family History:   family history includes Heart attack in his father; Hyperlipidemia in his father; Hypertension in his father.    Review of Systems: Review of Systems  Constitutional: Negative.   Respiratory: Negative.    Cardiovascular: Negative.   Gastrointestinal: Negative.   Musculoskeletal: Negative.   Neurological:  Positive for dizziness.       Balance problems  Psychiatric/Behavioral: Negative.    All other systems reviewed and are negative.   PHYSICAL EXAM: VS:  There were no vitals taken for this visit. , BMI There is no height or weight on file to calculate BMI.  Constitutional:  oriented to person, place, and time. No distress.  HENT:  Head: Grossly normal Eyes:  no discharge. No scleral icterus.  Neck: No JVD, no carotid bruits  Cardiovascular: Irreg irreg, , no murmurs appreciated Pulmonary/Chest: Clear to auscultation bilaterally, no wheezes or rails Abdominal: Soft.  no distension.  no tenderness.  Musculoskeletal: Normal range of motion Neurological:  normal muscle tone. Coordination normal. 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) No recent TIA or stroke symptoms On Xarelto, stable  Essential hypertension Increased dose of metoprolol as above  Mixed hyperlipidemia Weight low but stable over the past year, currently not on a statin  Chronic diastolic and systolic CHF Appears relatively euvolemic, continue Lasix 20 mg daily Renal function stable 9 days ago  Cardiomyopathy Mildly depressed ejection fraction suspected secondary to underlying atrial fibrillation He is confused concerning cardiac catheterization, suspect memory issues Will order pharmacologic Myoview, he is in agreement  Shortness of breath Stable symptoms, done better  on Lasix 20 daily Increased dose metoprolol for rate control  OSA, on CPAP Stressed the importance of staying on his CPAP   Total encounter time more than 25 minutes  Greater than 50% was spent in counseling and coordination of care with the patient    No orders of the defined types were placed in this encounter.    Signed, Dossie Arbour, M.D., Ph.D. 01/31/2021  [facility] [place], [place] [phone]

## 2021-02-01 ENCOUNTER — Ambulatory Visit: Payer: Medicare PPO | Admitting: Cardiovascular Disease

## 2021-02-01 DIAGNOSIS — I429 Cardiomyopathy, unspecified: Secondary | ICD-10-CM

## 2021-02-01 DIAGNOSIS — I639 Cerebral infarction, unspecified: Secondary | ICD-10-CM

## 2021-02-01 DIAGNOSIS — R0602 Shortness of breath: Secondary | ICD-10-CM

## 2021-02-01 DIAGNOSIS — I77810 Thoracic aortic ectasia: Secondary | ICD-10-CM

## 2021-02-01 DIAGNOSIS — Q211 Atrial septal defect, unspecified: Secondary | ICD-10-CM

## 2021-02-01 DIAGNOSIS — I4821 Permanent atrial fibrillation: Secondary | ICD-10-CM

## 2021-02-01 DIAGNOSIS — I10 Essential (primary) hypertension: Secondary | ICD-10-CM

## 2021-02-01 DIAGNOSIS — I5021 Acute systolic (congestive) heart failure: Secondary | ICD-10-CM

## 2021-02-04 ENCOUNTER — Encounter: Payer: Self-pay | Admitting: Emergency Medicine

## 2021-02-04 ENCOUNTER — Ambulatory Visit
Admission: EM | Admit: 2021-02-04 | Discharge: 2021-02-04 | Disposition: A | Discharge status: Home / Self Care | Payer: Medicare PPO | Attending: Medical Oncology | Admitting: Medical Oncology

## 2021-02-04 ENCOUNTER — Other Ambulatory Visit: Payer: Self-pay

## 2021-02-04 DIAGNOSIS — R0981 Nasal congestion: Secondary | ICD-10-CM | POA: Insufficient documentation

## 2021-02-04 DIAGNOSIS — Z20822 Contact with and (suspected) exposure to covid-19: Secondary | ICD-10-CM | POA: Insufficient documentation

## 2021-02-04 LAB — RESP PANEL BY RT-PCR (FLU A&B, COVID) ARPGX2
Influenza A by PCR: NEGATIVE
Influenza B by PCR: NEGATIVE
SARS Coronavirus 2 by RT PCR: NEGATIVE

## 2021-02-04 MED ORDER — AMOXICILLIN-POT CLAVULANATE 875-125 MG PO TABS: 1.0000 | ORAL_TABLET | Freq: Two times a day (BID) | ORAL | 0 refills | Status: DC

## 2021-02-04 MED ORDER — FLUTICASONE PROPIONATE 50 MCG/ACT NA SUSP: 2.0000 | Freq: Every day | NASAL | 0 refills | Status: DC

## 2021-02-04 NOTE — ED Provider Notes (Signed)
[facility]    CSN: [id number] Arrival date & time: 02/04/21  1136      History   Chief Complaint Chief Complaint  Patient presents with   Nasal Congestion   Headache    HPI Gary Dawson is a 81 y.o. male.   HPI  Cold Symptoms: Patient reports that they have had symptoms of nasal congestion, sneezing, mild to moderate sinus headache for the past 5 days. Symptoms are slightly worse since Wednesday as he has noticed more nasal mucous. They deny cough, SOB, chest pain, fever or vomiting. They have tried Coricidin and antihistamines for symptoms with minimal relief. No known sick contacts.    Past Medical History:  Diagnosis Date   Atypical chest pain    a. 03/2012 St echo: nl EF, no wma's.   CVA (cerebral vascular accident) (HCC)    Depression    Dysrhythmia    Elevated PSA    Essential hypertension    Hyperlipidemia    MVP (mitral valve prolapse)    Persistent atrial fibrillation (HCC)    a. CHA2DS2VASc = 4-->chronic Xarelto.   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   Sleep disturbance 11/04/2013   Pure hypercholesterolemia 05/22/2013   Benign essential hypertension 05/08/2013   Hyperlipidemia 05/08/2013   Shortness of breath 05/08/2013   Permanent atrial fibrillation (HCC) 05/06/2013   CVA (cerebral vascular accident) (HCC) 05/06/2013   Benign localized prostatic hyperplasia with lower urinary tract symptoms (LUTS) 03/06/2012   Calculus in bladder 03/06/2012   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   Elevated prostate specific antigen (PSA) 03/06/2012   Incomplete emptying of bladder 03/06/2012   Kidney stone 03/06/2012    Microscopic hematuria 03/06/2012   Sleep apnea in adult 08/27/2003   Enlarged prostate 08/26/1973   Irregular heart beat 08/26/1973    Past Surgical History:  Procedure Laterality Date   COLONOSCOPY WITH PROPOFOL     CYSTOSCOPY WITH LITHOLAPAXY N/A 11/01/2018   Procedure: CYSTOSCOPY WITH LITHOLAPAXY;  Surgeon: [name], MD;  Location: [facility];  Service: Urology;  Laterality: N/A;   HERNIA REPAIR     HOLEP-LASER ENUCLEATION OF THE PROSTATE WITH MORCELLATION N/A 11/01/2018   Procedure: HOLEP-LASER ENUCLEATION OF THE PROSTATE WITH MORCELLATION;  Surgeon: [name], MD;  Location: [facility];  Service: Urology;  Laterality: N/A;   TONSILLECTOMY     VARICOSE VEIN SURGERY         Home Medications    Prior to Admission medications   Medication Sig Start Date End Date Taking? Authorizing Provider  amoxicillin-clavulanate (AUGMENTIN) 875-125 MG tablet Take 1 tablet by mouth every 12 (twelve) hours. 02/04/21  Yes CovingtonMaralyn Sago M, PA-C  CYANOCOBALAMIN ER PO Take 2,000 mcg by mouth daily.   Yes [provider]  fluticasone (FLONASE) 50 MCG/ACT nasal spray Place 2 sprays into both nostrils daily. 02/04/21  Yes Elden Brucato, Maralyn Sago M, PA-C  furosemide (LASIX) 20 MG tablet TAKE 1 TABLET EVERY DAY 09/13/20  Yes Dunn, Raymon Mutton, PA-C  hydroxypropyl methylcellulose / hypromellose (ISOPTO TEARS / GONIOVISC) 2.5 % ophthalmic solution Place 1 drop into both eyes 2 (two) times daily as needed for dry eyes.  Yes [provider]  metoprolol succinate (TOPROL-XL) 50 MG 24 hr tablet Take 1 tablet (50 mg total) by mouth daily. Take 1.5 tablets (75 mg) by mouth once daily. Take with or immediately following a meal. 08/16/20  Yes Gollan, Kathlene November, MD  potassium chloride (KLOR-CON) 10 MEQ tablet Take 1 tablet (10 mEq total) by mouth daily. 04/22/20 02/04/21 Yes Dunn, Ryan M, PA-C  Vibegron 75 MG TABS Take 75 mg by mouth daily. 10/25/20  Yes Billey Co, MD  VITAMIN D, ERGOCALCIFEROL, PO  Take 1,000 Int'l Units by mouth daily.   Yes [provider]  XARELTO 20 MG TABS tablet TAKE 1 TABLET EVERY DAY WITH SUPPER 07/05/20  Yes Gollan, Kathlene November, MD  ibuprofen (ADVIL) 200 MG tablet Take 200 mg by mouth as needed.    [provider]  metoprolol tartrate (LOPRESSOR) 50 MG tablet Take 1 tablet (50 mg total) by mouth once for 1 dose. Take 2 hours prior to your cardiac CTA, take with Toprol 50 mg 08/25/20 08/25/20  Minna Merritts, MD    Family History Family History  Problem Relation Age of Onset   Heart attack Father    Hypertension Father    Hyperlipidemia Father     Social History Social History   Tobacco Use   Smoking status: Never   Smokeless tobacco: Never  Vaping Use   Vaping Use: Never used  Substance Use Topics   Alcohol use: Yes    Alcohol/week: 1.0 standard drink    Types: 1 Glasses of wine per week    Comment: social    Drug use: No     Allergies   Other and Tilactase   Review of Systems Review of Systems  As stated above in HPI Physical Exam Triage Vital Signs ED Triage Vitals  Enc Vitals Group     BP 02/04/21 1149 131/78     Pulse Rate 02/04/21 1149 78     Resp 02/04/21 1149 15     Temp 02/04/21 1149 98 F (36.7 C)     Temp Source 02/04/21 1149 Oral     SpO2 02/04/21 1149 100 %     Weight 02/04/21 1144 177 lb 4 oz (80.4 kg)     Height 02/04/21 1144 6\' 1"  (1.854 m)     Head Circumference --      Peak Flow --      Pain Score 02/04/21 1144 3     Pain Loc --      Pain Edu? --      Excl. in Pulcifer? --    No data found.  Updated Vital Signs BP 131/78 (BP Location: Left Arm)    Pulse 78    Temp 98 F (36.7 C) (Oral)    Resp 15    Ht 6\' 1"  (1.854 m)    Wt 177 lb 4 oz (80.4 kg)    SpO2 100%    BMI 23.39 kg/m   Physical Exam Vitals and nursing note reviewed.  Constitutional:      General: He is not in acute distress.    Appearance: He is well-developed.  HENT:     Head: Normocephalic and atraumatic.     Right Ear:  Tympanic membrane normal.     Left Ear: Tympanic membrane normal.     Nose: Congestion (scant maxillary bogginess on the right side) and rhinorrhea present.     Mouth/Throat:     Mouth: Mucous membranes are moist.     Pharynx:  Oropharynx is clear. No oropharyngeal exudate or posterior oropharyngeal erythema.  Eyes:     Conjunctiva/sclera: Conjunctivae normal.  Cardiovascular:     Rate and Rhythm: Normal rate and regular rhythm.     Heart sounds: No murmur heard. Pulmonary:     Effort: Pulmonary effort is normal. No respiratory distress.     Breath sounds: Normal breath sounds.  Abdominal:     Palpations: Abdomen is soft.     Tenderness: There is no abdominal tenderness.  Musculoskeletal:        General: No swelling.     Cervical back: Neck supple.  Lymphadenopathy:     Cervical: No cervical adenopathy.  Skin:    General: Skin is warm and dry.     Capillary Refill: Capillary refill takes less than 2 seconds.  Neurological:     Mental Status: He is alert.  Psychiatric:        Mood and Affect: Mood normal.     UC Treatments / Results  Labs (all labs ordered are listed, but only abnormal results are displayed) Labs Reviewed  RESP PANEL BY RT-PCR (FLU A&B, COVID) [id number]    EKG   Radiology No results found.  Procedures Procedures (including critical care time)  Medications Ordered in UC Medications - No data to display  Initial Impression / Assessment and Plan / UC Course  I have reviewed the triage vital signs and the nursing notes.  Pertinent labs & imaging results that were available during my care of the patient were reviewed by me and considered in my medical decision making (see chart for details).     New. Viral testing pending. If negative We will treat with flonase and sinus rinses. Should symptoms worsen he will start the ABX prescribed. Discussed red flag signs and symptoms. Follow up PRN.  Final Clinical Impressions(s) / UC Diagnoses   Final  diagnoses:  Sinus congestion   Discharge Instructions   None    ED Prescriptions     Medication Sig Dispense Auth. Provider   amoxicillin-clavulanate (AUGMENTIN) 875-125 MG tablet Take 1 tablet by mouth every 12 (twelve) hours. 14 tablet Kristene Liberati M, PA-C   fluticasone [facility]) 50 MCG/ACT nasal spray Place 2 sprays into both nostrils daily. 16 mL Hughie Closs, [place]      PDMP not reviewed this encounter.   Hughie Closs, [place] 02/04/21 1242

## 2021-02-04 NOTE — ED Triage Notes (Signed)
Patient c/o nasal congestion, sneezing and headache that started on Monday.  Patient denies fevers.

## 2021-02-14 NOTE — Progress Notes (Deleted)
Cardiology Office Note  Date:  02/14/2021   ID:  Gary Dawson, Gary Dawson 11-24-1940, MRN [id number]  PCP:  Gracelyn Nurse, MD   No chief complaint on file.   HPI:  Gary Dawson is a  81 year old gentleman with  long history of atrial fibrillation dating back to at least 2008,  prior small stroke in June 2010,  hyperlipidemia,  OSA, on CPAP who presents  for routine followup of his permanent atrial fibrillation, cardiomyopathy  LOV with myself 11/2019  Presented to the office May 2022  with one of our providers with hypervolemia Echocardiogram May 2022 ejection fraction 45% Moderately reduced RV function Moderately dilated left atrium, severely dilated right atrium  office in 11/2019 and noted to be in A. fib with RVR with ventricular 133 bpm On Xarelto  There had been discussion of right and left heart catheterization May 2022, it would appear he did not undergo the procedure Significant confusion concerning this procedure  In follow-up today, reports that his Weight stable Trace LE edema left >right Has a sleep number bed Did not realize he is on lasix,  Has prostate issues, followed by urology Incontinence  Some confusion concerning medications, recent details concerning his health Active with no regular exercise  Lab work reviewed with him HBA1C 5.9 CBC normal,  Total chol 135, LDL 71  EKG personally reviewed by myself on todays visit Atrial fibrillation rate 103 bpm nonspecific T wave  Other past medical history He had a stress test, perfusion Myoview. This showed small region of perfusion defect, predominantly fixed in the inferoapical region. Low ejection fraction 35-40%, suspect secondary to conduction abnormality    in atrial fibrillation in 2008 on EKG, normal sinus rhythm in June 2010, EKG February 2014 showing atrial fibrillation   Treadmill stress echo March 2014 showed no wall motion abnormality concerning for ischemia, good exercise tolerance,  peak heart rate of 214 beats per minute ? With peak systolic pressure 191/96. Resting heart rate 98 beats per minute.    Echocardiogram June 2010 showing ejection fraction greater than 55%, mildly dilated left atrium, mild LVH, mild to moderate TR, mild to moderate MR Carotid ultrasound June 2010 showing no significant plaque MRI of the brain June 2010 showing tiny nonhemorrhagic left posterior parietal lobe infarct  PMH:   has a past medical history of Atypical chest pain, CVA (cerebral vascular accident) (HCC), Depression, Dysrhythmia, Elevated PSA, Essential hypertension, Hyperlipidemia, MVP (mitral valve prolapse), Persistent atrial fibrillation (HCC), Psoriasis, Reflux, Sleep apnea, and Vitamin B 12 deficiency.  PSH:    Past Surgical History:  Procedure Laterality Date   COLONOSCOPY WITH PROPOFOL     CYSTOSCOPY WITH LITHOLAPAXY N/A 11/01/2018   Procedure: CYSTOSCOPY WITH LITHOLAPAXY;  Surgeon: [name], MD;  Location: [facility];  Service: Urology;  Laterality: N/A;   HERNIA REPAIR     HOLEP-LASER ENUCLEATION OF THE PROSTATE WITH MORCELLATION N/A 11/01/2018   Procedure: HOLEP-LASER ENUCLEATION OF THE PROSTATE WITH MORCELLATION;  Surgeon: [name], MD;  Location: [facility];  Service: Urology;  Laterality: N/A;   TONSILLECTOMY     VARICOSE VEIN SURGERY      Current Outpatient Medications  Medication Sig Dispense Refill   amoxicillin-clavulanate (AUGMENTIN) 875-125 MG tablet Take 1 tablet by mouth every 12 (twelve) hours. 14 tablet 0   CYANOCOBALAMIN ER PO Take 2,000 mcg by mouth daily.     fluticasone (FLONASE) 50 MCG/ACT nasal spray Place 2 sprays into both nostrils daily. 16 mL 0   furosemide (  LASIX) 20 MG tablet TAKE 1 TABLET EVERY DAY 90 tablet 3   hydroxypropyl methylcellulose / hypromellose (ISOPTO TEARS / GONIOVISC) 2.5 % ophthalmic solution Place 1 drop into both eyes 2 (two) times daily as needed for dry eyes.     ibuprofen (ADVIL) 200 MG tablet Take 200 mg by  mouth as needed.     metoprolol succinate (TOPROL-XL) 50 MG 24 hr tablet Take 1 tablet (50 mg total) by mouth daily. Take 1.5 tablets (75 mg) by mouth once daily. Take with or immediately following a meal. 90 tablet 3   metoprolol tartrate (LOPRESSOR) 50 MG tablet Take 1 tablet (50 mg total) by mouth once for 1 dose. Take 2 hours prior to your cardiac CTA, take with Toprol 50 mg 1 tablet 0   potassium chloride (KLOR-CON) 10 MEQ tablet Take 1 tablet (10 mEq total) by mouth daily. 90 tablet 3   Vibegron 75 MG TABS Take 75 mg by mouth daily. 30 tablet 11   VITAMIN D, ERGOCALCIFEROL, PO Take 1,000 Int'l Units by mouth daily.     XARELTO 20 MG TABS tablet TAKE 1 TABLET EVERY DAY WITH SUPPER 90 tablet 1   No current facility-administered medications for this visit.    Allergies:   Other and Tilactase   Social History:  The patient  reports that he has never smoked. He has never used smokeless tobacco. He reports current alcohol use of about 1.0 standard drink per week. He reports that he does not use drugs.   Family History:   family history includes Heart attack in his father; Hyperlipidemia in his father; Hypertension in his father.    Review of Systems: Review of Systems  Constitutional: Negative.   Respiratory: Negative.    Cardiovascular: Negative.   Gastrointestinal: Negative.   Musculoskeletal: Negative.   Neurological:  Positive for dizziness.       Balance problems  Psychiatric/Behavioral: Negative.    All other systems reviewed and are negative.   PHYSICAL EXAM: VS:  There were no vitals taken for this visit. , BMI There is no height or weight on file to calculate BMI.  Constitutional:  oriented to person, place, and time. No distress.  HENT:  Head: Grossly normal Eyes:  no discharge. No scleral icterus.  Neck: No JVD, no carotid bruits  Cardiovascular: Irreg irreg, , no murmurs appreciated Pulmonary/Chest: Clear to auscultation bilaterally, no wheezes or rails Abdominal:  Soft.  no distension.  no tenderness.  Musculoskeletal: Normal range of motion Neurological:  normal muscle tone. Coordination normal. 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) No recent TIA or stroke symptoms On Xarelto, stable  Essential hypertension Increased dose of metoprolol as above  Mixed hyperlipidemia Weight low but stable over the past year, currently not on a statin  Chronic diastolic and systolic CHF Appears relatively euvolemic, continue Lasix 20 mg daily Renal function stable 9  days ago  Cardiomyopathy Mildly depressed ejection fraction suspected secondary to underlying atrial fibrillation He is confused concerning cardiac catheterization, suspect memory issues Will order pharmacologic Myoview, he is in agreement  Shortness of breath Stable symptoms, done better on Lasix 20 daily Increased dose metoprolol for rate control  OSA, on CPAP Stressed the importance of staying on his CPAP   Total encounter time more than 25 minutes  Greater than 50% was spent in counseling and coordination of care with the patient    No orders of the defined types were placed in this encounter.    Signed, Dossie Arbour, M.D., Ph.D. 02/14/2021  [facility] [place], [place] [phone]

## 2021-02-15 ENCOUNTER — Ambulatory Visit: Payer: Medicare PPO | Admitting: Cardiovascular Disease

## 2021-02-15 DIAGNOSIS — R0602 Shortness of breath: Secondary | ICD-10-CM

## 2021-02-15 DIAGNOSIS — I429 Cardiomyopathy, unspecified: Secondary | ICD-10-CM

## 2021-02-15 DIAGNOSIS — E785 Hyperlipidemia, unspecified: Secondary | ICD-10-CM

## 2021-02-15 DIAGNOSIS — I77810 Thoracic aortic ectasia: Secondary | ICD-10-CM

## 2021-02-15 DIAGNOSIS — I10 Essential (primary) hypertension: Secondary | ICD-10-CM

## 2021-02-15 DIAGNOSIS — Q211 Atrial septal defect, unspecified: Secondary | ICD-10-CM

## 2021-02-15 DIAGNOSIS — I5021 Acute systolic (congestive) heart failure: Secondary | ICD-10-CM

## 2021-02-15 DIAGNOSIS — I4821 Permanent atrial fibrillation: Secondary | ICD-10-CM

## 2021-02-15 DIAGNOSIS — I639 Cerebral infarction, unspecified: Secondary | ICD-10-CM

## 2021-03-03 ENCOUNTER — Other Ambulatory Visit: Payer: Self-pay

## 2021-03-03 ENCOUNTER — Ambulatory Visit
Admission: EM | Admit: 2021-03-03 | Discharge: 2021-03-03 | Disposition: A | Discharge status: Home / Self Care | Payer: Medicare PPO | Attending: Family | Admitting: Family

## 2021-03-03 DIAGNOSIS — R1084 Generalized abdominal pain: Secondary | ICD-10-CM | POA: Diagnosis not present

## 2021-03-03 DIAGNOSIS — R519 Headache, unspecified: Secondary | ICD-10-CM

## 2021-03-03 DIAGNOSIS — R110 Nausea: Secondary | ICD-10-CM

## 2021-03-03 MED ORDER — ONDANSETRON 4 MG PO TBDP: 4.0000 mg | ORAL_TABLET | Freq: Three times a day (TID) | ORAL | 0 refills | Status: DC | PRN

## 2021-03-03 NOTE — ED Provider Notes (Signed)
[facility]    CSN: [id number] Arrival date & time: 03/03/21  0920      History   Chief Complaint Chief Complaint  Patient presents with   Headache   Nausea    HPI Gary Dawson is a 81 y.o. male.   81 year old male presents with frontal headache, nausea and intermittent stomach pains for the past 2 days. Had muffins and pastries at work for Valentine's Day (2 days ago) and then started feeling "bad' on way home from work. Also has noticed decreased appetite, has felt warm but no documented fever and slight ear pain. Denies any nasal congestion, sore throat, vomiting or diarrhea. Not sleeping well. No vision changes or dizziness. Has taken OTC Tylenol and Advil with minimal relief. No other family members ill. Other chronic health issues include A. Fib, HTN, hyperlipidemia, BPH, history of CVA in 2015 and sleep disorder. Currently on Xarelto, Toprol XL, and Vibegron daily. Uncertain if he is still taking Lasix. Has been going for Acupuncture treatment for his prostate/urology and other GI issues with good success.   The history is provided by the patient.   Past Medical History:  Diagnosis Date   Atypical chest pain    a. 03/2012 St echo: nl EF, no wma's.   CVA (cerebral vascular accident) ([place])    Depression    Dysrhythmia    Elevated PSA    Essential hypertension    Hyperlipidemia    MVP (mitral valve prolapse)    Persistent atrial fibrillation (HCC)    a. CHA2DS2VASc = 4-->chronic Xarelto.   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   Sleep disturbance 11/04/2013   Pure hypercholesterolemia 05/22/2013   Benign essential  hypertension 05/08/2013   Hyperlipidemia 05/08/2013   Shortness of breath 05/08/2013   Permanent atrial fibrillation ([place]) 05/06/2013   CVA (cerebral vascular accident) ([place]) 05/06/2013   Benign localized prostatic hyperplasia with lower urinary tract symptoms (LUTS) 03/06/2012   Calculus in bladder 03/06/2012   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   Elevated prostate specific antigen (PSA) 03/06/2012   Incomplete emptying of bladder 03/06/2012   Kidney stone 03/06/2012   Microscopic hematuria 03/06/2012   Sleep apnea in adult 08/27/2003   Enlarged prostate 08/26/1973   Irregular heart beat 08/26/1973    Past Surgical History:  Procedure Laterality Date   COLONOSCOPY WITH PROPOFOL     CYSTOSCOPY WITH LITHOLAPAXY N/A 11/01/2018   Procedure: CYSTOSCOPY WITH LITHOLAPAXY;  Surgeon: Billey Co, MD;  Location: [facility];  Service: Urology;  Laterality: N/A;   HERNIA REPAIR     HOLEP-LASER ENUCLEATION OF THE PROSTATE WITH MORCELLATION N/A 11/01/2018   Procedure: HOLEP-LASER ENUCLEATION OF THE PROSTATE WITH MORCELLATION;  Surgeon: Billey Co, MD;  Location: [facility];  Service: Urology;  Laterality: N/A;   TONSILLECTOMY     VARICOSE VEIN SURGERY         Home Medications    Prior to Admission medications   Medication Sig Start Date End Date Taking? Authorizing Provider  CYANOCOBALAMIN ER PO Take 2,000 mcg by mouth daily.   Yes [provider]  furosemide (LASIX) 20 MG tablet TAKE 1  TABLET EVERY DAY 09/13/20  Yes Dunn, Areta Haber, PA-C  hydroxypropyl methylcellulose / hypromellose (ISOPTO TEARS / GONIOVISC) 2.5 % ophthalmic solution Place 1 drop into both eyes 2 (two) times daily as needed for dry eyes.   Yes [provider]  ibuprofen (ADVIL) 200 MG tablet Take 200 mg by mouth as needed.   Yes [provider]  metoprolol succinate (TOPROL-XL) 50 MG 24 hr tablet Take 1 tablet (50 mg total) by mouth daily. Take 1.5 tablets (75 mg) by mouth once daily. Take with  or immediately following a meal. 08/16/20  Yes Gollan, Kathlene November, MD  ondansetron (ZOFRAN-ODT) 4 MG disintegrating tablet Take 1 tablet (4 mg total) by mouth every 8 (eight) hours as needed for nausea. 03/03/21  Yes Chiquetta Langner, Nicholes Stairs, NP  Vibegron 75 MG TABS Take 75 mg by mouth daily. 10/25/20  Yes Billey Co, MD  VITAMIN D, ERGOCALCIFEROL, PO Take 1,000 Int'l Units by mouth daily.   Yes [provider]  XARELTO 20 MG TABS tablet TAKE 1 TABLET EVERY DAY WITH SUPPER 07/05/20  Yes Gollan, Kathlene November, MD  fluticasone (FLONASE) 50 MCG/ACT nasal spray Place 2 sprays into both nostrils daily. 02/04/21   Hughie Closs, PA-C  metoprolol tartrate (LOPRESSOR) 50 MG tablet Take 1 tablet (50 mg total) by mouth once for 1 dose. Take 2 hours prior to your cardiac CTA, take with Toprol 50 mg 08/25/20 08/25/20  Minna Merritts, MD  potassium chloride (KLOR-CON) 10 MEQ tablet Take 1 tablet (10 mEq total) by mouth daily. 04/22/20 02/04/21  Rise Mu, PA-C    Family History Family History  Problem Relation Age of Onset   Heart attack Father    Hypertension Father    Hyperlipidemia Father     Social History Social History   Tobacco Use   Smoking status: Never   Smokeless tobacco: Never  Vaping Use   Vaping Use: Never used  Substance Use Topics   Alcohol use: Yes    Alcohol/week: 1.0 standard drink    Types: 1 Glasses of wine per week    Comment: social    Drug use: No     Allergies   Other and Tilactase   Review of Systems Review of Systems  Constitutional:  Positive for appetite change, chills and fatigue. Negative for activity change and diaphoresis. Fever: has felt warm. HENT:  Positive for ear pain (slight). Negative for congestion, ear discharge, facial swelling, hearing loss, mouth sores, nosebleeds, postnasal drip, rhinorrhea, sinus pressure, sinus pain, sore throat and trouble swallowing.   Eyes:  Negative for photophobia, discharge, itching and visual disturbance.   Respiratory:  Negative for cough and shortness of breath.   Cardiovascular:  Negative for chest pain.  Gastrointestinal:  Positive for abdominal pain and nausea. Negative for diarrhea and vomiting.  Musculoskeletal:  Negative for back pain, myalgias, neck pain and neck stiffness.  Skin:  Negative for color change and rash.  Allergic/Immunologic: Positive for environmental allergies and food allergies. Negative for immunocompromised state.  Neurological:  Positive for light-headedness (slight) and headaches. Negative for tremors, seizures, syncope, facial asymmetry, speech difficulty, weakness and numbness.  Hematological:  Negative for adenopathy. Bruises/bleeds easily.  Psychiatric/Behavioral:  Positive for sleep disturbance.     Physical Exam Triage Vital Signs ED Triage Vitals  Enc Vitals Group     BP 03/03/21 0932 137/90     Pulse Rate 03/03/21 0932 61     Resp 03/03/21 0932 18  Temp 03/03/21 0932 98.4 F (36.9 C)     Temp Source 03/03/21 0932 Oral     SpO2 03/03/21 0932 100 %     Weight 03/03/21 0927 177 lb 4 oz (80.4 kg)     Height 03/03/21 0927 6\' 1"  (1.854 m)     Head Circumference --      Peak Flow --      Pain Score 03/03/21 0932 4     Pain Loc --      Pain Edu? --      Excl. in [place]? --    No data found.  Updated Vital Signs BP 137/90 (BP Location: Left Arm)    Pulse 61    Temp 98.4 F (36.9 C) (Oral)    Resp 18    Ht 6\' 1"  (1.854 m)    Wt 177 lb 4 oz (80.4 kg)    SpO2 100%    BMI 23.39 kg/m   Visual Acuity Right Eye Distance:   Left Eye Distance:   Bilateral Distance:    Right Eye Near:   Left Eye Near:    Bilateral Near:     Physical Exam Vitals and nursing note reviewed.  Constitutional:      General: He is awake. He is not in acute distress.    Appearance: He is well-developed and well-groomed. He is not ill-appearing.     Comments: Pleasant patient sitting on the exam table in no acute distress. Changes positions slowly. Talks in complete  sentences.   HENT:     Head: Normocephalic and atraumatic.     Right Ear: Hearing, tympanic membrane, ear canal and external ear normal.     Left Ear: Hearing, tympanic membrane, ear canal and external ear normal.     Nose: No congestion.     Right Sinus: Frontal sinus tenderness present. No maxillary sinus tenderness.     Left Sinus: Frontal sinus tenderness present. No maxillary sinus tenderness.     Mouth/Throat:     Lips: Pink.     Mouth: Mucous membranes are moist.     Pharynx: Oropharynx is clear. Uvula midline. No pharyngeal swelling, oropharyngeal exudate, posterior oropharyngeal erythema or uvula swelling.  Eyes:     Extraocular Movements: Extraocular movements intact.     Conjunctiva/sclera: Conjunctivae normal.     Pupils: Pupils are equal, round, and reactive to light.  Cardiovascular:     Rate and Rhythm: Normal rate. Rhythm irregular.  Pulmonary:     Effort: Pulmonary effort is normal. No respiratory distress.     Breath sounds: Normal breath sounds and air entry. No decreased air movement. No decreased breath sounds, wheezing, rhonchi or rales.  Abdominal:     General: Abdomen is flat. Bowel sounds are normal. There is no distension.     Palpations: Abdomen is soft. There is no hepatomegaly, splenomegaly or mass.     Tenderness: There is no abdominal tenderness. There is no right CVA tenderness or left CVA tenderness.  Musculoskeletal:     Cervical back: Normal range of motion and neck supple.  Lymphadenopathy:     Cervical: No cervical adenopathy.  Skin:    General: Skin is warm and dry.     Capillary Refill: Capillary refill takes less than 2 seconds.     Findings: No rash.  Neurological:     General: No focal deficit present.     Mental Status: He is alert and oriented to person, place, and time.     Cranial Nerves:  Cranial nerves 2-12 are intact.     Sensory: Sensation is intact. No sensory deficit.     Gait: Gait is intact.  Psychiatric:        Attention  and Perception: Attention normal.        Mood and Affect: Mood normal.        Speech: Speech normal.        Behavior: Behavior normal. Behavior is cooperative.        Thought Content: Thought content normal.        Cognition and Memory: Memory is impaired.     Comments: Patient has slight difficulty recalling some recent events and what medications he is currently still taking. Otherwise, no distinct neuro deficits noted.      UC Treatments / Results  Labs (all labs ordered are listed, but only abnormal results are displayed) Labs Reviewed - No data to display  EKG   Radiology No results found.  Procedures Procedures (including critical care time)  Medications Ordered in UC Medications - No data to display  Initial Impression / Assessment and Plan / UC Course  I have reviewed the triage vital signs and the nursing notes.  Pertinent labs & imaging results that were available during my care of the patient were reviewed by me and considered in my medical decision making (see chart for details).     Reviewed with patient that he may have a viral GI illness. Recently have seen other patients with headache, nausea +/- vomiting and low grade fever which usually resolve within 3 days. Do not see any concerning neurological changes at this time but Need to continue to monitor symptoms. Recommend start Zofran 4mg  ODT every 8 hours as needed for nausea. May take Tylenol 1000mg  every 8 hours as needed for headache or abdominal pain/cramping. Continue to slowly push water and fluids then eat soft, bland foods and advance diet as tolerated. If headache and nausea do not improve within 72 hours or if they get worse, return here for recheck or go to the ER ASAP.  Final Clinical Impressions(s) / UC Diagnoses   Final diagnoses:  Frontal headache  Nausea  Generalized abdominal cramping     Discharge Instructions      Recommend start Zofran 4mg  dissolving tablet- take 1 every 8 hours as  needed for nausea. May take Tylenol 1000mg  every 8 hours as needed for headache or abdominal pain. Continue to slowly push fluids and then eat soft, bland foods. If headache and nausea do not improve within 48 to 72 hours or they get worse, return for recheck or go to the ER ASAP.     ED Prescriptions     Medication Sig Dispense Auth. Provider   ondansetron (ZOFRAN-ODT) 4 MG disintegrating tablet Take 1 tablet (4 mg total) by mouth every 8 (eight) hours as needed for nausea. 20 tablet Allean Montfort, Nicholes Stairs, NP      PDMP not reviewed this encounter.   [name] Apo, NP 03/04/21 1209

## 2021-03-03 NOTE — Discharge Instructions (Addendum)
Recommend start Zofran 4mg  dissolving tablet- take 1 every 8 hours as needed for nausea. May take Tylenol 1000mg  every 8 hours as needed for headache or abdominal pain. Continue to slowly push fluids and then eat soft, bland foods. If headache and nausea do not improve within 48 to 72 hours or they get worse, return for recheck or go to the ER ASAP.

## 2021-03-03 NOTE — ED Triage Notes (Signed)
Pt here with C/O nausea and headache since eating Valentines dinner on Tuesday. Denies vomit, or other SX.

## 2021-03-07 NOTE — Progress Notes (Signed)
Cardiology Office Note  Date:  03/08/2021   ID:  Gary Dawson, Gary Dawson 09/19/1940, MRN [id number]  PCP:  Gracelyn Nurse, MD   Chief Complaint  Patient presents with   Follow-up    "Doing well."  Medications reviewed by the patient verbally.     HPI:  Gary Dawson is a  81 year old gentleman with  long history of atrial fibrillation dating back to at least 2008,  prior small stroke in June 2010,  hyperlipidemia,  OSA, on CPAP who presents  for routine followup of his permanent atrial fibrillation, cardiomyopathy  LOV with myself 7/22  In follow-up today reports having chronic GI issues, gas, sometimes with bowel or bladder incontinence Has figured out that his problem is with artificial sweeteners Stopped the sweeteners, ABD sx better  Has not been taking higher dose metoprolol succinate 75 daily, reports only taking 50 as he felt somewhat lightheaded on the 75 daily Appears diltiazem ER 120 was added to his medication list, he is not particularly sure he is taking this  Denies any leg edema, elevates his legs, sedentary at home, gait instability  No  TIA or CVV sx  Lab work reviewed with him HBA1C 5.9 CBC normal,  Total chol 135, LDL 71 Prefers not to be on statin CR 0.9  EKG personally reviewed by myself on todays visit Atrial fib rate 110  Other past medical history reviewed Echocardiogram May 2022 ejection fraction 45% Moderately reduced RV function Moderately dilated left atrium, severely dilated right atrium  office in 11/2019 and noted to be in A. fib with RVR with ventricular 133 bpm On Xarelto  Other past medical history He had a stress test, perfusion Myoview. This showed small region of perfusion defect, predominantly fixed in the inferoapical region. Low ejection fraction 35-40%, suspect secondary to conduction abnormality    in atrial fibrillation in 2008 on EKG, normal sinus rhythm in June 2010, EKG February 2014 showing atrial  fibrillation   Treadmill stress echo March 2014 showed no wall motion abnormality concerning for ischemia, good exercise tolerance, peak heart rate of 214 beats per minute ? With peak systolic pressure 191/96. Resting heart rate 98 beats per minute.    Echocardiogram June 2010 showing ejection fraction greater than 55%, mildly dilated left atrium, mild LVH, mild to moderate TR, mild to moderate MR Carotid ultrasound June 2010 showing no significant plaque MRI of the brain June 2010 showing tiny nonhemorrhagic left posterior parietal lobe infarct  PMH:   has a past medical history of Atypical chest pain, CVA (cerebral vascular accident) (HCC), Depression, Dysrhythmia, Elevated PSA, Essential hypertension, Hyperlipidemia, MVP (mitral valve prolapse), Persistent atrial fibrillation (HCC), Psoriasis, Reflux, Sleep apnea, and Vitamin B 12 deficiency.  PSH:    Past Surgical History:  Procedure Laterality Date   COLONOSCOPY WITH PROPOFOL     CYSTOSCOPY WITH LITHOLAPAXY N/A 11/01/2018   Procedure: CYSTOSCOPY WITH LITHOLAPAXY;  Surgeon: [name], MD;  Location: [facility];  Service: Urology;  Laterality: N/A;   HERNIA REPAIR     HOLEP-LASER ENUCLEATION OF THE PROSTATE WITH MORCELLATION N/A 11/01/2018   Procedure: HOLEP-LASER ENUCLEATION OF THE PROSTATE WITH MORCELLATION;  Surgeon: [name], MD;  Location: [facility];  Service: Urology;  Laterality: N/A;   TONSILLECTOMY     VARICOSE VEIN SURGERY      Current Outpatient Medications  Medication Sig Dispense Refill   CYANOCOBALAMIN ER PO Take 2,000 mcg by mouth daily.     diltiazem (CARDIZEM CD) 120 MG 24  hr capsule Take 120 mg by mouth daily.     furosemide (LASIX) 20 MG tablet TAKE 1 TABLET EVERY DAY 90 tablet 3   hydroxypropyl methylcellulose / hypromellose (ISOPTO TEARS / GONIOVISC) 2.5 % ophthalmic solution Place 1 drop into both eyes 2 (two) times daily as needed for dry eyes.     metoprolol succinate (TOPROL-XL) 50 MG 24 hr tablet  Take 50 mg by mouth daily. Take with or immediately following a meal.     potassium chloride (KLOR-CON) 10 MEQ tablet Take 1 tablet (10 mEq total) by mouth daily. 90 tablet 3   Vibegron 75 MG TABS Take 75 mg by mouth daily. 30 tablet 11   VITAMIN D, ERGOCALCIFEROL, PO Take 1,000 Int'l Units by mouth daily.     XARELTO 20 MG TABS tablet TAKE 1 TABLET EVERY DAY WITH SUPPER 90 tablet 1   fluticasone (FLONASE) 50 MCG/ACT nasal spray Place 2 sprays into both nostrils daily. 16 mL 0   ibuprofen (ADVIL) 200 MG tablet Take 200 mg by mouth as needed.     ondansetron (ZOFRAN-ODT) 4 MG disintegrating tablet Take 1 tablet (4 mg total) by mouth every 8 (eight) hours as needed for nausea. 20 tablet 0   No current facility-administered medications for this visit.    Allergies:   Other and Tilactase   Social History:  The patient  reports that he has never smoked. He has never used smokeless tobacco. He reports current alcohol use of about 1.0 standard drink per week. He reports that he does not use drugs.   Family History:   family history includes Heart attack in his father; Hyperlipidemia in his father; Hypertension in his father.    Review of Systems: Review of Systems  Constitutional: Negative.   Respiratory: Negative.    Cardiovascular: Negative.   Gastrointestinal: Negative.   Musculoskeletal: Negative.   Neurological:  Positive for dizziness.       Balance problems  Psychiatric/Behavioral: Negative.    All other systems reviewed and are negative.   PHYSICAL EXAM: VS:  BP 128/60 (BP Location: Left Arm, Patient Position: Sitting, Cuff Size: Normal)    Pulse (!) 110    Ht 6\' 1"  (1.854 m)    Wt 187 lb 4 oz (84.9 kg)    SpO2 98%    BMI 24.70 kg/m  , BMI Body mass index is 24.7 kg/m.  Constitutional:  oriented to person, place, and time. No distress.  HENT:  Head: Grossly normal Eyes:  no discharge. No scleral icterus.  Neck: No JVD, no carotid bruits  Cardiovascular: Irreg irreg, rapid, ,  no murmurs appreciated Pulmonary/Chest: Clear to auscultation bilaterally, no wheezes or rails Abdominal: Soft.  no distension.  no tenderness.  Musculoskeletal: Normal range of motion Neurological:  normal muscle tone. Coordination normal. No atrophy Skin: Skin warm and dry Psychiatric: normal affect, pleasant  Recent Labs: 03/19/2020: ALT 35 05/28/2020: BUN 32; Creatinine, Ser 0.82; Hemoglobin 12.3; Platelets 203; Potassium 4.1; Sodium 137    Lipid Panel No results found for: CHOL, HDL, LDLCALC, TRIG    Wt Readings from Last 3 Encounters:  03/08/21 187 lb 4 oz (84.9 kg)  03/03/21 177 lb 4 oz (80.4 kg)  02/04/21 177 lb 4 oz (80.4 kg)     ASSESSMENT AND PLAN:  Permenant atrial fibrillation (HCC) Rate poorly controlled Unclear medication compliance or what he is taking at home Stay on Xarelto Recommend he retry metoprolol succinate 75 daily Likely not taking the diltiazem, recommend he clarify  his medication list with [place]  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) No recent TIA or stroke symptoms On Xarelto, stable  Essential hypertension We will try higher dose metoprolol succinate again for rate control  Mixed hyperlipidemia Weight low but stable over the past year, currently not on a statin  Chronic diastolic and systolic CHF Appears relatively euvolemic, continue Lasix 20 mg daily Renal function stable 9 days ago  Cardiomyopathy Mildly depressed ejection fraction suspected secondary to underlying atrial fibrillation He is confused concerning cardiac catheterization, suspect memory issues Myoview fixed defect 8/22 Prior stress test reviewed with him, no further ischemic work-up  Shortness of breath Stay on lasix 20 daily  OSA, on CPAP Stressed the importance of staying on his CPAP   Total encounter time more than 30 minutes  Greater than 50% was spent in counseling and coordination of care with the patient    Orders Placed  This Encounter  Procedures   EKG 12-Lead     Signed, Dossie Arbour, M.D., Ph.D. 03/08/2021  [facility] [place], [place] [phone]

## 2021-03-08 ENCOUNTER — Encounter: Payer: Self-pay | Admitting: Cardiovascular Disease

## 2021-03-08 ENCOUNTER — Ambulatory Visit: Payer: Medicare PPO | Admitting: Cardiovascular Disease

## 2021-03-08 ENCOUNTER — Other Ambulatory Visit: Payer: Self-pay

## 2021-03-08 VITALS — BP 128/60 | HR 110 | Ht 73.0 in | Wt 187.2 lb

## 2021-03-08 DIAGNOSIS — I639 Cerebral infarction, unspecified: Secondary | ICD-10-CM

## 2021-03-08 DIAGNOSIS — R0602 Shortness of breath: Secondary | ICD-10-CM

## 2021-03-08 DIAGNOSIS — I10 Essential (primary) hypertension: Secondary | ICD-10-CM

## 2021-03-08 DIAGNOSIS — I77810 Thoracic aortic ectasia: Secondary | ICD-10-CM

## 2021-03-08 DIAGNOSIS — I429 Cardiomyopathy, unspecified: Secondary | ICD-10-CM | POA: Diagnosis not present

## 2021-03-08 DIAGNOSIS — I4821 Permanent atrial fibrillation: Secondary | ICD-10-CM

## 2021-03-08 DIAGNOSIS — Q211 Atrial septal defect, unspecified: Secondary | ICD-10-CM

## 2021-03-08 DIAGNOSIS — I5021 Acute systolic (congestive) heart failure: Secondary | ICD-10-CM

## 2021-03-08 MED ORDER — METOPROLOL SUCCINATE ER 50 MG PO TB24: ORAL_TABLET | ORAL | 3 refills | Status: DC

## 2021-03-08 NOTE — Patient Instructions (Addendum)
Medication Instructions:  Please increase the metoprolol succinate up to 75 mg daily (Ok to do a 50 in the Am and  a 25 in the Pm)  Please take your Diltiazem 120 mg DAILY Please take your Lasix 20 mg DAILY Please take your Xeralto 20 mg DAILY  If you need a refill on your cardiac medications before your next appointment, please call your pharmacy.   Lab work: No new labs needed  Testing/Procedures: No new testing needed  Follow-Up: At [facility], you and your health needs are our priority.  As part of our continuing mission to provide you with exceptional heart care, we have created designated Provider Care Teams.  These Care Teams include your primary Cardiologist (physician) and Advanced Practice Providers (APPs -  Physician Assistants and Nurse Practitioners) who all work together to provide you with the care you need, when you need it.  You will need a follow up appointment in 6 months  Providers on your designated Care Team:   Murray Hodgkins, NP Christell Faith, PA-C Cadence Kathlen Mody, [place]  COVID-19 Vaccine Information can be found at: [email or web address] For questions related to vaccine distribution or appointments, please email [email or web address] or call [phone].

## 2021-04-15 DIAGNOSIS — G629 Polyneuropathy, unspecified: Secondary | ICD-10-CM | POA: Insufficient documentation

## 2021-04-18 DIAGNOSIS — M216X1 Other acquired deformities of right foot: Secondary | ICD-10-CM | POA: Insufficient documentation

## 2021-06-01 ENCOUNTER — Telehealth: Payer: Self-pay

## 2021-06-01 NOTE — Telephone Encounter (Signed)
Pt LM on triage line stating that he has decided to d/c Gemtesa as it has not been helpful. He wanted to make [place] aware.  ?

## 2021-06-08 ENCOUNTER — Ambulatory Visit
Admission: EM | Admit: 2021-06-08 | Discharge: 2021-06-08 | Disposition: A | Discharge status: Home / Self Care | Payer: Medicare PPO | Attending: Emergency Medicine | Admitting: Emergency Medicine

## 2021-06-08 DIAGNOSIS — R42 Dizziness and giddiness: Secondary | ICD-10-CM | POA: Diagnosis present

## 2021-06-08 DIAGNOSIS — E860 Dehydration: Secondary | ICD-10-CM | POA: Diagnosis present

## 2021-06-08 LAB — COMPREHENSIVE METABOLIC PANEL
ALT: 25 U/L (ref 0–44)
AST: 31 U/L (ref 15–41)
Albumin: 3.5 g/dL (ref 3.5–5.0)
Alkaline Phosphatase: 71 U/L (ref 38–126)
Anion gap: 7 (ref 5–15)
BUN: 24 mg/dL — ABNORMAL HIGH (ref 8–23)
CO2: 25 mmol/L (ref 22–32)
Calcium: 8.7 mg/dL — ABNORMAL LOW (ref 8.9–10.3)
Chloride: 104 mmol/L (ref 98–111)
Creatinine, Ser: 0.88 mg/dL (ref 0.61–1.24)
GFR, Estimated: >60 mL/min (ref >60)
Glucose, Bld: 98 mg/dL (ref 70–99)
Potassium: 3.8 mmol/L (ref 3.5–5.1)
Sodium: 136 mmol/L (ref 135–145)
Total Bilirubin: 1 mg/dL (ref 0.3–1.2)
Total Protein: 7.1 g/dL (ref 6.5–8.1)

## 2021-06-08 MED ORDER — SODIUM CHLORIDE 0.9 % IV BOLUS
500.0000 mL | Freq: Once | INTRAVENOUS | Status: AC
  Administered 2021-06-08: 500 mL via INTRAVENOUS

## 2021-06-08 NOTE — ED Provider Notes (Signed)
[facility]    CSN: [id number] Arrival date & time: 06/08/21  0848      History   Chief Complaint Chief Complaint  Patient presents with   Dizziness    HPI Gary Dawson is a 81 y.o. male.   HPI  81 year old male here for evaluation of dizziness.  Patient reports that he has been experiencing dizziness for the last 3 to 4 months that he has discussed with his primary care physician.  He has an upcoming referral to neurology on 06/30/2021.  He describes his symptoms as being more of an off-balance feeling and he denies any room spinning.  He states he is also been having balance issues and issues with walking.  He also endorses that he has been experiencing some intermittent tremors that he feels are greater on the left than the right.  This morning when he went to get up he states that his feeling of being off balance was greatly increased and he felt like he was going to fall.  This is not associated with any headache, changes in vision, chest pain, shortness of breath, nausea, vomiting, or weakness.  He does have a history of atrial fibrillation which is controlled with metoprolol.  He is also on Xarelto as anticoagulant.  He has not missed any doses.  Past Medical History:  Diagnosis Date   Atypical chest pain    a. 03/2012 St echo: nl EF, no wma's.   CVA (cerebral vascular accident) ([place])    Depression    Dysrhythmia    Elevated PSA    Essential hypertension    Hyperlipidemia    MVP (mitral valve prolapse)    Persistent atrial fibrillation (HCC)    a. CHA2DS2VASc = 4-->chronic Xarelto.   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   Sleep disturbance 11/04/2013   Pure hypercholesterolemia 05/22/2013   Benign essential hypertension 05/08/2013   Hyperlipidemia 05/08/2013   Shortness of breath 05/08/2013   Permanent atrial fibrillation ([place]) 05/06/2013   CVA (cerebral vascular accident) ([place]) 05/06/2013   Benign localized prostatic hyperplasia with lower urinary tract symptoms (LUTS) 03/06/2012   Calculus in bladder 03/06/2012   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   Elevated prostate specific antigen (PSA) 03/06/2012   Incomplete emptying of bladder 03/06/2012   Kidney stone 03/06/2012   Microscopic hematuria 03/06/2012   Sleep apnea in adult 08/27/2003   Enlarged prostate 08/26/1973   Irregular heart beat 08/26/1973    Past Surgical History:  Procedure Laterality Date   COLONOSCOPY WITH PROPOFOL     CYSTOSCOPY WITH LITHOLAPAXY N/A 11/01/2018   Procedure: CYSTOSCOPY WITH LITHOLAPAXY;  Surgeon: Billey Co, MD;  Location: [facility];  Service: Urology;  Laterality: N/A;   HERNIA REPAIR     HOLEP-LASER ENUCLEATION OF THE PROSTATE WITH MORCELLATION N/A 11/01/2018   Procedure: HOLEP-LASER ENUCLEATION OF THE PROSTATE WITH MORCELLATION;  Surgeon: Billey Co, MD;  Location: [facility];  Service: Urology;  Laterality: N/A;   TONSILLECTOMY     VARICOSE VEIN SURGERY         Home Medications  Prior to Admission medications   Medication Sig Start Date End Date Taking? Authorizing Provider  sertraline (ZOLOFT) 25 MG tablet Take 25 mg by mouth daily. 01/20/21  Yes [provider]  CYANOCOBALAMIN ER PO Take 2,000 mcg by mouth daily.    [provider]  diltiazem (CARDIZEM CD) 120 MG 24 hr capsule Take 120 mg by mouth daily. 10/22/20   [provider]  furosemide (LASIX) 20 MG tablet TAKE 1 TABLET EVERY DAY 09/13/20   Dunn, Areta Haber, PA-C  gabapentin (NEURONTIN) 100 MG capsule Take 1 capsule by mouth 3 (three) times  daily. 05/13/21 05/13/22  [provider]  hydroxypropyl methylcellulose / hypromellose (ISOPTO TEARS / GONIOVISC) 2.5 % ophthalmic solution Place 1 drop into both eyes 2 (two) times daily as needed for dry eyes.    [provider]  metoprolol succinate (TOPROL-XL) 50 MG 24 hr tablet Take 75 mg daily (Ok to do a 50 mg in the Am and 25 mg in the Pm) 03/08/21   Gollan, Kathlene November, MD  Vibegron 75 MG TABS Take 75 mg by mouth daily. 10/25/20   Billey Co, MD  VITAMIN D, ERGOCALCIFEROL, PO Take 1,000 Int'l Units by mouth daily.    [provider]  XARELTO 20 MG TABS tablet TAKE 1 TABLET EVERY DAY WITH SUPPER 07/05/20   Minna Merritts, MD    Family History Family History  Problem Relation Age of Onset   Heart attack Father    Hypertension Father    Hyperlipidemia Father     Social History Social History   Tobacco Use   Smoking status: Never   Smokeless tobacco: Never  Vaping Use   Vaping Use: Never used  Substance Use Topics   Alcohol use: Yes    Alcohol/week: 1.0 standard drink    Types: 1 Glasses of wine per week    Comment: social    Drug use: No     Allergies   Other   Review of Systems Review of Systems  Eyes:  Negative for visual disturbance.  Respiratory:  Negative for shortness of breath and wheezing.   Cardiovascular:  Negative for chest pain and palpitations.  Gastrointestinal:  Negative for nausea and vomiting.  Skin:  Negative for rash.  Neurological:  Positive for dizziness, tremors and light-headedness. Negative for facial asymmetry, weakness and headaches.  Hematological: Negative.   Psychiatric/Behavioral: Negative.      Physical Exam Triage Vital Signs ED Triage Vitals  Enc Vitals Group     BP 06/08/21 0910 (!) 139/109     Pulse Rate 06/08/21 0910 82     Resp 06/08/21 0910 18     Temp 06/08/21 0910 97.9 F (36.6 C)     Temp Source 06/08/21 0910 Oral     SpO2 06/08/21 0910 100 %     Weight 06/08/21 0906 186 lb (84.4  kg)     Height 06/08/21 0906 6\' 1"  (1.854 m)     Head Circumference --      Peak Flow --      Pain Score 06/08/21 0902 0     Pain Loc --      Pain Edu? --      Excl. in [place]? --    No data found.  Updated Vital Signs BP (!) 141/94 (BP Location: Left Arm) Comment: 2nd reading. tolerated well.  Pulse 88   Temp 97.9 F (36.6 C) (Oral)   Resp 18   Ht 6\' 1"  (1.854 m)  Wt 186 lb (84.4 kg)   SpO2 100%   BMI 24.54 kg/m   Visual Acuity Right Eye Distance:   Left Eye Distance:   Bilateral Distance:    Right Eye Near:   Left Eye Near:    Bilateral Near:     Physical Exam Vitals and nursing note reviewed.  Constitutional:      Appearance: Normal appearance. He is not ill-appearing.  HENT:     Head: Normocephalic and atraumatic.     Right Ear: Tympanic membrane, ear canal and external ear normal. There is no impacted cerumen.     Left Ear: Tympanic membrane, ear canal and external ear normal. There is no impacted cerumen.     Mouth/Throat:     Mouth: Mucous membranes are dry.     Pharynx: Oropharynx is clear. No oropharyngeal exudate or posterior oropharyngeal erythema.  Eyes:     General: No scleral icterus.       Right eye: No discharge.        Left eye: No discharge.     Extraocular Movements: Extraocular movements intact.     Conjunctiva/sclera: Conjunctivae normal.     Pupils: Pupils are equal, round, and reactive to light.  Cardiovascular:     Rate and Rhythm: Normal rate. Rhythm irregular.     Pulses: Normal pulses.     Heart sounds: Normal heart sounds. No murmur heard.   No friction rub. No gallop.  Pulmonary:     Effort: Pulmonary effort is normal.     Breath sounds: Normal breath sounds. No wheezing, rhonchi or rales.  Musculoskeletal:     Cervical back: Normal range of motion and neck supple.  Skin:    General: Skin is warm.     Capillary Refill: Capillary refill takes less than 2 seconds.     Findings: No erythema or rash.  Neurological:     General: No  focal deficit present.     Mental Status: He is alert and oriented to person, place, and time.     Cranial Nerves: No cranial nerve deficit.     Sensory: No sensory deficit.     Motor: No weakness.     Coordination: Coordination normal.  Psychiatric:        Mood and Affect: Mood normal.        Behavior: Behavior normal.        Thought Content: Thought content normal.        Judgment: Judgment normal.     UC Treatments / Results  Labs (all labs ordered are listed, but only abnormal results are displayed) Labs Reviewed  COMPREHENSIVE METABOLIC PANEL - Abnormal; Notable for the following components:      Result Value   BUN 24 (*)    Calcium 8.7 (*)    All other components within normal limits    EKG Atrial fibrillation with a ventricular rate of 98 bpm Peer interval incalculable QT/QTc 364/465 ms No ST or T wave abnormalities.  No additional tracings available in epic for review.   Radiology No results found.  Procedures Procedures (including critical care time)  Medications Ordered in UC Medications  sodium chloride 0.9 % bolus 500 mL (0 mLs Intravenous Stopped 06/08/21 1034)    Initial Impression / Assessment and Plan / UC Course  I have reviewed the triage vital signs and the nursing notes.  Pertinent labs & imaging results that were available during my care of the patient were reviewed by me and considered in my medical  decision making (see chart for details).  Patient is a pleasant, nontoxic-appearing 81 year old male here for evaluation of dizziness as outlined in HPI above.  Physical exam reveals cranial nerves II through XII intact.  Pupils are equal round reactive and EOMs intact.  No nystagmus or strabismus.  Bilateral tympanic membranes are pearly gray in appearance with normal light reflex and the external auditory canals are clear.  Patient's sclera is sticky and his mucous membranes are sticky as well.  Otherwise his oral pharyngeal exam is benign.  No  cervical lymphadenopathy appreciated exam.  Cardiopulmonary exam reveals an irregularly irregular heartbeat with S1-S2 heart sounds.  Lung sounds are clear to auscultation all fields.  Patient's finger-to-nose is intact though he does have a little more difficulty with his left hand than his right.  Heel-to-shin is normal.  Bilateral grips and upper extremity strength are 5/5 in bilateral lower EXTR and strength is 5/5.  Patient also has a negative Romberg.  He did not have any difficulty transitioning from a laying, to sitting, to a standing position.  He states he feels better overall.  Given the patient's sequela of symptoms consisting of dizziness, tremors, balance issues I am concerned that he has some vestibular involvement.  I do not think he is having any acute infarct or injury.  He does have a upcoming appointment with neurology.  He does report that he has been having some neuropathy in his feet that consist of intermittent burning.  He also has weakness in his quads which he is prescribed exercises and states that he has not been doing them as directed here lately.  I do believe that patient's symptoms this morning may be exacerbated by some mild dehydration given the tacky sclera and sticky or mucous membranes.  I will check a CMP and administer 500 mils of normal saline to see if the patient's symptoms improve.  An EKG was collected at triage which shows atrial fibrillation with a ventricular rate of 98 bpm.  The typical EKG machine is not working properly and the substitute is not interfacing with epic so I do not have a tracing to review.  CMP demonstrates some mildly increased BUN but his creatinine is fine at 0.88.  Calcium is mildly decreased at 8.7.  Electrolytes and transaminases are unremarkable.  Following IV fluid administration patient states that he is feeling much better.  His sclera is bright and shiny and his oral mucous membranes are not sticky.  I will discharge patient home and  have him continue hydrating orally.  I have encouraged him to get a water bottle that he can see through and that is graduated so that he can give himself a visual reminder to drink.  He should shoot for a goal of 64 ounces water a day.  He states that he hardly drinks anything during the day, even when he is out in the heat.  He also states he does not sweat when he is out in the heat.  He does have an upcoming appoint with neurology for which I have encouraged him to discuss not only his burning in his feet but also the balance issues, tremors, and dizziness that he has been experiencing.  I have also instructed the patient to go to the ER if he develops any headache, changes in his vision, palpitations, chest pain, nausea, vomiting, or sharp increase in dizziness.   Final Clinical Impressions(s) / UC Diagnoses   Final diagnoses:  Dizziness  Dehydration  Discharge Instructions      As we discussed, I think some of your symptoms today were exacerbated by the fact that you were mildly dehydrated.  The IV fluids we gave you will help partially correct that.  You need to go home and continue to drink water.  As we discussed, I recommend you get a water bottle that is graduated and see-through so that she can give yourself a visual cue to drink fluids.  I would shoot for goal of 64 ounces of water daily.  Keep your appointment with neurology on June 15 as previously scheduled.  Discussed your balance issues, dizziness, and the burning you are having in your feet.  If you have a sharp increase in your dizziness, headache, changes in vision, palpitations, chest pain, nausea, or vomiting associated with any of the above symptoms please call 911 and go to the ER for evaluation.     ED Prescriptions   None    PDMP not reviewed this encounter.   Margarette Canada, NP 06/08/21 1048

## 2021-06-08 NOTE — ED Triage Notes (Signed)
Patient is here for "Dizziness". Started approximately about 3-4 mos ago "but this morning, go to get out of bed, and couldn't get out of bed due to dizziness". No nausea. No vomiting. No chest pain. No sob. No runny nose. No cough. "No recent illness". "This was different". No recent injury.

## 2021-06-08 NOTE — ED Triage Notes (Signed)
Patient presents to Urgent Care with complaints of dizziness x 2 months. Pt states worse this morning. He reports slight HA today. PCP aware of problem.   Denies changes in vision, chest pain, SOB, or numbness/tingling to extremities.

## 2021-06-08 NOTE — ED Notes (Signed)
Patient is resting comfortably. 

## 2021-06-08 NOTE — Discharge Instructions (Addendum)
As we discussed, I think some of your symptoms today were exacerbated by the fact that you were mildly dehydrated.  The IV fluids we gave you will help partially correct that.  You need to go home and continue to drink water.  As we discussed, I recommend you get a water bottle that is graduated and see-through so that she can give yourself a visual cue to drink fluids.  I would shoot for goal of 64 ounces of water daily.  Keep your appointment with neurology on June 15 as previously scheduled.  Discussed your balance issues, dizziness, and the burning you are having in your feet.  If you have a sharp increase in your dizziness, headache, changes in vision, palpitations, chest pain, nausea, or vomiting associated with any of the above symptoms please call 911 and go to the ER for evaluation.

## 2021-06-08 NOTE — ED Notes (Signed)
Normal EKG machine (Down), reported to management. Replacement EKG (from other unit used). Result, Information pending. B. Roten CMA

## 2021-07-07 ENCOUNTER — Telehealth: Payer: Self-pay | Admitting: Cardiovascular Disease

## 2021-07-07 MED ORDER — RIVAROXABAN 20 MG PO TABS: 20.0000 mg | ORAL_TABLET | Freq: Every day | ORAL | 1 refills | Status: DC

## 2021-07-07 MED ORDER — METOPROLOL SUCCINATE ER 50 MG PO TB24: ORAL_TABLET | ORAL | 2 refills | Status: DC

## 2021-07-07 NOTE — Telephone Encounter (Signed)
Prescription refill request for Xarelto received.  Indication: a fib Last office visit: 03/08/21 Weight: 187 Age: 81  Scr: 0.88 CrCl: 82 mL/min

## 2021-07-07 NOTE — Telephone Encounter (Signed)
Refill Request.  

## 2021-07-07 NOTE — Telephone Encounter (Signed)
*  STAT* If patient is at the pharmacy, call can be transferred to refill team.   1. Which medications need to be refilled? (please list name of each medication and dose if known) metoprolol succinate (TOPROL-XL) 50 MG 24 hr tablet  2. Which pharmacy/location (including street and city if local pharmacy) is medication to be sent to? [id number] - [place], [place] - [address] [place] [address]  3. Do they need a 30 day or 90 day supply? 90

## 2021-07-07 NOTE — Telephone Encounter (Signed)
Pt c/o medication issue:  1. Name of Medication:   metoprolol succinate (TOPROL-XL) 50 MG 24 hr tablet    2. How are you currently taking this medication (dosage and times per day)? As written  3. Are you having a reaction (difficulty breathing--STAT)? no  4. What is your medication issue? Pt called asking why this medication was discontinued from him. He stated that he would like to know whether it was Dr. Mariah Milling or the pharmacy. If it was from Dr. Mariah Milling, he would like a call back for further explanation.

## 2021-07-07 NOTE — Telephone Encounter (Signed)
Requested Prescriptions   Signed Prescriptions Disp Refills   metoprolol succinate (TOPROL-XL) 50 MG 24 hr tablet 135 tablet 2    Sig: Take 75 mg daily (Ok to do a 50 mg in the Am and 25 mg in the Pm)    Authorizing Provider: Antonieta Iba    Ordering User: Kendrick Fries

## 2021-07-07 NOTE — Telephone Encounter (Signed)
After reviewing pt's chart, called and spoke with pt. Notified that I see that the current plan is to continue Metoprolol Succinate 75 mg daily (may take 50 mg in AM and 25 mg in PM). Pt voiced understanding. Pt appreciative and has no further needs at this time.

## 2021-07-07 NOTE — Telephone Encounter (Signed)
*  STAT* If patient is at the pharmacy, call can be transferred to refill team.   1. Which medications need to be refilled? (please list name of each medication and dose if known)   XARELTO 20 MG TABS tablet    2. Which pharmacy/location (including street and city if local pharmacy) is medication to be sent to?  [id number] - [place], [place] - [address] [address]  3. Do they need a 30 day or 90 day supply? 90  Pt called and medication was sent to the wrong pharmacy. Prescription should be sent to the pharmacy above.

## 2021-07-12 ENCOUNTER — Ambulatory Visit: Payer: Medicare PPO | Admitting: Urology

## 2021-07-18 ENCOUNTER — Telehealth: Payer: Self-pay | Admitting: Cardiovascular Disease

## 2021-07-18 MED ORDER — RIVAROXABAN 20 MG PO TABS: 20.0000 mg | ORAL_TABLET | Freq: Every day | ORAL | 1 refills | Status: DC

## 2021-07-18 NOTE — Telephone Encounter (Signed)
*  STAT* If patient is at the pharmacy, call can be transferred to refill team.   1. Which medications need to be refilled? (please list name of each medication and dose if known)  need a new prescription  for his Xarelto- changing pharmacy  2. Which pharmacy/location (including street and city if local pharmacy) is medication to be sent to? [facility]  3. Do they need a 30 day or 90 day supply? 90 days and refills

## 2021-07-18 NOTE — Telephone Encounter (Signed)
Prescription refill request for Xarelto received.  Indication: Atrial Fib Last office visit: 03/08/21  T Gollan MD Weight: 84.9kg Age: 80 Scr: 0.88 on 06/08/21 CrCl: 81.74  Based on above findings Xarelto 20mg daily is the appropriate dose.  Refill approved.  

## 2021-07-18 NOTE — Telephone Encounter (Signed)
Refill request

## 2021-07-22 ENCOUNTER — Encounter: Payer: Self-pay | Admitting: Emergency Medicine

## 2021-07-22 ENCOUNTER — Ambulatory Visit
Admission: EM | Admit: 2021-07-22 | Discharge: 2021-07-22 | Disposition: A | Discharge status: Home / Self Care | Payer: Medicare PPO | Attending: Emergency Medicine | Admitting: Emergency Medicine

## 2021-07-22 ENCOUNTER — Other Ambulatory Visit: Payer: Self-pay | Admitting: Cardiovascular Disease

## 2021-07-22 ENCOUNTER — Other Ambulatory Visit: Payer: Self-pay

## 2021-07-22 DIAGNOSIS — R21 Rash and other nonspecific skin eruption: Secondary | ICD-10-CM

## 2021-07-22 MED ORDER — PREDNISONE 20 MG PO TABS: 40.0000 mg | ORAL_TABLET | Freq: Every day | ORAL | 0 refills | Status: DC

## 2021-07-22 NOTE — Telephone Encounter (Signed)
Prescription refill request for Xarelto received.  Indication: Atrial Fib Last office visit: 03/08/21  Concha Se MD Weight: 84.9kg Age: 81 Scr: 0.88 on 06/08/21 CrCl: 81.74  Based on above findings Xarelto 20mg  daily is the appropriate dose.  Refill approved.

## 2021-07-22 NOTE — ED Provider Notes (Signed)
[facility]    CSN: [id number] Arrival date & time: 07/22/21  1108      History   Chief Complaint Chief Complaint  Patient presents with   skin lesion    Right arm    HPI Gary Dawson is a 81 y.o. male.   Presents with a erythematous pruritic lesion to the right upper extremity and generalized pruritus to the back for 2 weeks.  Symptoms began after being outside in a wooded area, unsure if insect bite.  Lesion on arm has not increased in size.  Possible lesion to the back but unable to visualize at home.  Denies pain, drainage, fever or chills.  Has not attempted treatment.    Past Medical History:  Diagnosis Date   Atypical chest pain    a. 03/2012 St echo: nl EF, no wma's.   CVA (cerebral vascular accident) (HCC)    Depression    Dysrhythmia    Elevated PSA    Essential hypertension    Hyperlipidemia    MVP (mitral valve prolapse)    Persistent atrial fibrillation (HCC)    a. CHA2DS2VASc = 4-->chronic Xarelto.   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   Sleep disturbance 11/04/2013   Pure hypercholesterolemia 05/22/2013   Benign essential hypertension 05/08/2013   Hyperlipidemia 05/08/2013   Shortness of breath 05/08/2013   Permanent atrial fibrillation (HCC) 05/06/2013   CVA (cerebral vascular accident) (HCC) 05/06/2013   Benign localized prostatic hyperplasia with lower urinary tract symptoms (LUTS) 03/06/2012   Calculus in bladder 03/06/2012   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   Elevated prostate specific antigen (PSA) 03/06/2012   Incomplete emptying  of bladder 03/06/2012   Kidney stone 03/06/2012   Microscopic hematuria 03/06/2012   Sleep apnea in adult 08/27/2003   Enlarged prostate 08/26/1973   Irregular heart beat 08/26/1973    Past Surgical History:  Procedure Laterality Date   COLONOSCOPY WITH PROPOFOL     CYSTOSCOPY WITH LITHOLAPAXY N/A 11/01/2018   Procedure: CYSTOSCOPY WITH LITHOLAPAXY;  Surgeon: [name], MD;  Location: [facility];  Service: Urology;  Laterality: N/A;   HERNIA REPAIR     HOLEP-LASER ENUCLEATION OF THE PROSTATE WITH MORCELLATION N/A 11/01/2018   Procedure: HOLEP-LASER ENUCLEATION OF THE PROSTATE WITH MORCELLATION;  Surgeon: [name], MD;  Location: [facility];  Service: Urology;  Laterality: N/A;   TONSILLECTOMY     VARICOSE VEIN SURGERY         Home Medications    Prior to Admission medications   Medication Sig Start Date End Date Taking? Authorizing Provider  CYANOCOBALAMIN ER PO Take 2,000 mcg by mouth daily.   Yes [provider]  diltiazem (CARDIZEM CD) 120 MG 24 hr capsule Take 120 mg by mouth daily. 10/22/20  Yes [provider]  furosemide (LASIX) 20 MG tablet TAKE 1 TABLET EVERY DAY 09/13/20  Yes Dunn, Raymon Mutton, PA-C  metoprolol succinate (TOPROL-XL) 50 MG 24 hr tablet Take 75 mg daily (Ok to do a 50 mg in the Am and 25 mg in the Pm) 07/07/21  Yes  Antonieta Iba, MD  Vibegron 75 MG TABS Take 75 mg by mouth daily. 10/25/20  Yes [name], MD  VITAMIN D, ERGOCALCIFEROL, PO Take 1,000 Int'l Units by mouth daily.   Yes [provider]  XARELTO 20 MG TABS tablet TAKE 1 TABLET EVERY DAY WITH SUPPER 07/22/21  Yes Gollan, Tollie Pizza, MD  gabapentin (NEURONTIN) 100 MG capsule Take 1 capsule by mouth 3 (three) times daily. 05/13/21 05/13/22  [provider]  hydroxypropyl methylcellulose / hypromellose (ISOPTO TEARS / GONIOVISC) 2.5 % ophthalmic solution Place 1 drop into both eyes 2 (two) times daily as needed for dry eyes.    [provider]   sertraline (ZOLOFT) 25 MG tablet Take 25 mg by mouth daily. 01/20/21   [provider]    Family History Family History  Problem Relation Age of Onset   Heart attack Father    Hypertension Father    Hyperlipidemia Father     Social History Social History   Tobacco Use   Smoking status: Never   Smokeless tobacco: Never  Vaping Use   Vaping Use: Never used  Substance Use Topics   Alcohol use: Yes    Alcohol/week: 1.0 standard drink of alcohol    Types: 1 Glasses of wine per week    Comment: social    Drug use: No     Allergies   Other   Review of Systems Review of Systems  Constitutional: Negative.   Respiratory: Negative.    Cardiovascular: Negative.   Skin:  Positive for rash. Negative for color change, pallor and wound.  Neurological: Negative.      Physical Exam Triage Vital Signs ED Triage Vitals  Enc Vitals Group     BP 07/22/21 1158 106/62     Pulse Rate 07/22/21 1158 (!) 59     Resp 07/22/21 1158 16     Temp 07/22/21 1158 97.8 F (36.6 C)     Temp Source 07/22/21 1158 Oral     SpO2 07/22/21 1158 100 %     Weight 07/22/21 1156 186 lb 1.1 oz (84.4 kg)     Height 07/22/21 1156 6\' 1"  (1.854 m)     Head Circumference --      Peak Flow --      Pain Score 07/22/21 1154 5     Pain Loc --      Pain Edu? --      Excl. in GC? --    No data found.  Updated Vital Signs BP 106/62 (BP Location: Right Arm)   Pulse (!) 59   Temp 97.8 F (36.6 C) (Oral)   Resp 16   Ht 6\' 1"  (1.854 m)   Wt 186 lb 1.1 oz (84.4 kg)   SpO2 100%   BMI 24.55 kg/m   Visual Acuity Right Eye Distance:   Left Eye Distance:   Bilateral Distance:    Right Eye Near:   Left Eye Near:    Bilateral Near:     Physical Exam Constitutional:      Appearance: Normal appearance.  Eyes:     Extraocular Movements: Extraocular movements intact.  Pulmonary:     Effort: Pulmonary effort is normal.  Skin:    Comments: Less than 0.5 cm papular lesion present to the  anterior of the right forearm with erythema surrounding, nontender, nondraining  Erythema present to the right thoracic region without lesions   Neurological:     Mental Status: He is alert and oriented to  person, place, and time. Mental status is at baseline.  Psychiatric:        Mood and Affect: Mood normal.        Behavior: Behavior normal.      UC Treatments / Results  Labs (all labs ordered are listed, but only abnormal results are displayed) Labs Reviewed - No data to display  EKG   Radiology No results found.  Procedures Procedures (including critical care time)  Medications Ordered in UC Medications - No data to display  Initial Impression / Assessment and Plan / UC Course  I have reviewed the triage vital signs and the nursing notes.  Pertinent labs & imaging results that were available during my care of the patient were reviewed by me and considered in my medical decision making (see chart for details).  Rash  Unknown etiology, possibly contact dermatitis of symptoms but can after he had exposure to a wooded and leafy plant area, not consistent with a poison ivy/oak dermatitis, discussed with patient, prednisone course prescribed, will avoid topical medications as patient is unable to reach his back and currently lives alone, may follow-up with urgent care or primary doctor symptoms persist or worsen Final Clinical Impressions(s) / UC Diagnoses   Final diagnoses:  None   Discharge Instructions   None    ED Prescriptions   None    PDMP not reviewed this encounter.   Valinda Hoar, NP 07/22/21 1407

## 2021-07-22 NOTE — Telephone Encounter (Signed)
Pt c/o medication issue:  1. Name of Medication:   rivaroxaban (XARELTO) 20 MG TABS tablet  2. How are you currently taking this medication (dosage and times per day)?   As prescribed  3. Are you having a reaction (difficulty breathing--STAT)?   No  4. What is your medication issue?    Patient stated he is running low.  He said the medication is very expensive and he needs help getting his prescription refilled.

## 2021-07-22 NOTE — Telephone Encounter (Signed)
Refill request

## 2021-07-22 NOTE — Telephone Encounter (Signed)
Refill Request.  

## 2021-07-22 NOTE — ED Triage Notes (Signed)
Pt has a skin lesion on his right forearm. He states he noticed it about a 2 weeks ago. He states he is unsure if he was bit by something. He states he also feels like something is on his back but is unable to see it himself.

## 2021-07-22 NOTE — Discharge Instructions (Signed)
On the area to your back there is redness but there is no bump similar to the one on your arm  Your rash appears to be a reaction to something that your skin possibly touched while you are in the wooded area, it does not appear to be infectious  Begin use of prednisone every morning with food for 5 days, this reduces the inflammatory response that occurs with rashes and ideally will help clear your symptoms  For the itching you may use oral Zyrtec or Claritin daily since you are unable to apply topical cream to your back  If your rash persist or worsen she may follow-up with urgent care or your primary doctor as needed for reevaluation

## 2021-09-17 ENCOUNTER — Other Ambulatory Visit: Payer: Self-pay | Admitting: Physician Assistant

## 2021-10-03 NOTE — Progress Notes (Unsigned)
Cardiology Office Note  Date:  10/04/2021   ID:  Gary Dawson, Gary Dawson [birth year], MRN [id number]  PCP:  Gracelyn Nurse, MD   Chief Complaint  Patient presents with   6 month follow up     "Doing well." Medications reviewed by the patient verbally.     HPI:  Gary Dawson is a  81 year old gentleman with  long history of atrial fibrillation dating back to at least 2008,  prior small stroke in June 2010,  hyperlipidemia,  OSA, on CPAP who presents  for routine followup of his permanent atrial fibrillation, cardiomyopathy  LOV with myself February 2023  On today's visit he reports that he was rushing Has had significant stressors on him including taking care of his house, yard work," among other issues" Reports the stresses building  Concerned that CPAP not working, mask does not fit not sleeping well  Taking metoprolol succinate 75 daily Not taking diltiazem 1-2 months ago  Noted to be in atrial fibrillation with rapid rate, reports he is not particularly symptomatic Arrival heart rate 110 up to 140 bpm at rest lying supine  EKG personally reviewed by myself on todays visit Atrial fibrillation ventricular rate 134 bpm nonspecific ST abnormality anterolateral leads consistent with LVH with strain  Other past medical history reviewed Echocardiogram May 2022 ejection fraction 45% Moderately reduced RV function Moderately dilated left atrium, severely dilated right atrium  office in 11/2019 and noted to be in A. fib with RVR with ventricular 133 bpm On Xarelto  Other past medical history He had a stress test, perfusion Myoview. This showed small region of perfusion defect, predominantly fixed in the inferoapical region. Low ejection fraction 35-40%, suspect secondary to conduction abnormality    in atrial fibrillation in 2008 on EKG, normal sinus rhythm in June 2010, EKG February 2014 showing atrial fibrillation   Treadmill stress echo March 2014 showed no wall  motion abnormality concerning for ischemia, good exercise tolerance, peak heart rate of 214 beats per minute ? With peak systolic pressure 191/96. Resting heart rate 98 beats per minute.    Echocardiogram June 2010 showing ejection fraction greater than 55%, mildly dilated left atrium, mild LVH, mild to moderate TR, mild to moderate MR Carotid ultrasound June 2010 showing no significant plaque MRI of the brain June 2010 showing tiny nonhemorrhagic left posterior parietal lobe infarct  PMH:   has a past medical history of Atypical chest pain, CVA (cerebral vascular accident) (HCC), Depression, Dysrhythmia, Elevated PSA, Essential hypertension, Hyperlipidemia, MVP (mitral valve prolapse), Persistent atrial fibrillation (HCC), Psoriasis, Reflux, Sleep apnea, and Vitamin B 12 deficiency.  PSH:    Past Surgical History:  Procedure Laterality Date   COLONOSCOPY WITH PROPOFOL     CYSTOSCOPY WITH LITHOLAPAXY N/A 11/01/2018   Procedure: CYSTOSCOPY WITH LITHOLAPAXY;  Surgeon: [name], MD;  Location: [facility];  Service: Urology;  Laterality: N/A;   HERNIA REPAIR     HOLEP-LASER ENUCLEATION OF THE PROSTATE WITH MORCELLATION N/A 11/01/2018   Procedure: HOLEP-LASER ENUCLEATION OF THE PROSTATE WITH MORCELLATION;  Surgeon: [name], MD;  Location: [facility];  Service: Urology;  Laterality: N/A;   TONSILLECTOMY     VARICOSE VEIN SURGERY      Current Outpatient Medications  Medication Sig Dispense Refill   diltiazem (CARDIZEM CD) 120 MG 24 hr capsule Take 1 capsule (120 mg) by mouth once daily at night 90 capsule 1   furosemide (LASIX) 20 MG tablet TAKE 1 TABLET EVERY DAY 90 tablet 1  hydroxypropyl methylcellulose / hypromellose (ISOPTO TEARS / GONIOVISC) 2.5 % ophthalmic solution Place 1 drop into both eyes 2 (two) times daily as needed for dry eyes.     VITAMIN D, ERGOCALCIFEROL, PO Take 1,000 Int'l Units by mouth daily.     XARELTO 20 MG TABS tablet TAKE 1 TABLET EVERY DAY WITH SUPPER 90  tablet 1   gabapentin (NEURONTIN) 100 MG capsule Take 1 capsule by mouth 3 (three) times daily. (Patient not taking: Reported on 10/04/2021)     metoprolol succinate (TOPROL-XL) 50 MG 24 hr tablet Take 2 tablets (100 mg) by mouth once daily in the morning 180 tablet 2   predniSONE (DELTASONE) 20 MG tablet Take 2 tablets (40 mg total) by mouth daily. (Patient not taking: Reported on 10/04/2021) 10 tablet 0   Vibegron 75 MG TABS Take 75 mg by mouth daily. (Patient not taking: Reported on 10/04/2021) 30 tablet 11   No current facility-administered medications for this visit.    Allergies:   Other   Social History:  The patient  reports that he has never smoked. He has never used smokeless tobacco. He reports current alcohol use of about 1.0 standard drink of alcohol per week. He reports that he does not use drugs.   Family History:   family history includes Heart attack in his father; Hyperlipidemia in his father; Hypertension in his father.    Review of Systems: Review of Systems  Constitutional: Negative.   Respiratory: Negative.    Cardiovascular: Negative.   Gastrointestinal: Negative.   Musculoskeletal: Negative.   Neurological:  Positive for dizziness.       Balance problems  Psychiatric/Behavioral: Negative.    All other systems reviewed and are negative.    PHYSICAL EXAM: VS:  BP 120/60 (BP Location: Left Arm, Patient Position: Sitting, Cuff Size: Normal)   Pulse (!) 134   Ht 6\' 1"  (1.854 m)   Wt 176 lb 8 oz (80.1 kg)   SpO2 98%   BMI 23.29 kg/m  , BMI Body mass index is 23.29 kg/m.  Constitutional:  oriented to person, place, and time. No distress.  HENT:  Head: Grossly normal Eyes:  no discharge. No scleral icterus.  Neck: No JVD, no carotid bruits  Cardiovascular: Irregularly irregular, rapid no murmurs appreciated Pulmonary/Chest: Clear to auscultation bilaterally, no wheezes or rails Abdominal: Soft.  no distension.  no tenderness.  Musculoskeletal: Normal range  of motion Neurological:  normal muscle tone. Coordination normal. No atrophy Skin: Skin warm and dry Psychiatric: normal affect, pleasant  Recent Labs: 06/08/2021: ALT 25; BUN 24; Creatinine, Ser 0.88; Potassium 3.8; Sodium 136    Lipid Panel No results found for: "CHOL", "HDL", "LDLCALC", "TRIG"    Wt Readings from Last 3 Encounters:  10/04/21 176 lb 8 oz (80.1 kg)  07/22/21 186 lb 1.1 oz (84.4 kg)  06/08/21 186 lb (84.4 kg)     ASSESSMENT AND PLAN:  Permenant atrial fibrillation ([place]) Rate poorly controlled on today's visit, likely secondary to not taking his medications, not on diltiazem, increased stressors, has been rushing today He was given diltiazem 60 mg pill in the office Recommend he increase metoprolol succinate up to 100 mg in the morning Reinitiate diltiazem ER 120 mg in the evening Stay on Xarelto Recommend he call us with heart rate numbers in the next several days  Cerebrovascular accident (CVA) due to embolism of precerebral artery ([place]) No recent TIA or stroke symptoms On Xarelto, stable  Essential hypertension Recommend metoprolol succinate 100 daily  in the morning, diltiazem ER 120 in the evening  Mixed hyperlipidemia Cholesterol well controlled total cholesterol 135 LDL 71  Chronic diastolic and systolic CHF Appears relatively euvolemic, continue Lasix 20 mg daily Has not been checked in 1 year Will defer to primary care for check in follow-up  Cardiomyopathy Mildly depressed ejection fraction suspected secondary to underlying atrial fibrillation He is confused concerning cardiac catheterization, suspect memory issues Myoview fixed defect 8/22 Prior stress test reviewed with him, no further ischemic work-up  Shortness of breath Stay on lasix 20 daily  OSA, on CPAP Stressed the importance of staying on his CPAP   Total encounter time more than 40 minutes  Greater than 50% was spent in counseling and coordination of care with the  patient    Orders Placed This Encounter  Procedures   EKG 12-Lead     Signed, Dossie Arbour, M.D., Ph.D. 10/04/2021  [facility] [place], [place] [phone]

## 2021-10-04 ENCOUNTER — Encounter: Payer: Self-pay | Admitting: Cardiovascular Disease

## 2021-10-04 ENCOUNTER — Ambulatory Visit: Payer: Medicare PPO | Attending: Cardiovascular Disease | Admitting: Cardiovascular Disease

## 2021-10-04 VITALS — BP 120/60 | HR 134 | Ht 73.0 in | Wt 176.5 lb

## 2021-10-04 DIAGNOSIS — I77810 Thoracic aortic ectasia: Secondary | ICD-10-CM

## 2021-10-04 DIAGNOSIS — I4821 Permanent atrial fibrillation: Secondary | ICD-10-CM

## 2021-10-04 DIAGNOSIS — R0602 Shortness of breath: Secondary | ICD-10-CM

## 2021-10-04 DIAGNOSIS — I639 Cerebral infarction, unspecified: Secondary | ICD-10-CM

## 2021-10-04 DIAGNOSIS — I10 Essential (primary) hypertension: Secondary | ICD-10-CM | POA: Diagnosis not present

## 2021-10-04 DIAGNOSIS — Q211 Atrial septal defect, unspecified: Secondary | ICD-10-CM

## 2021-10-04 DIAGNOSIS — I429 Cardiomyopathy, unspecified: Secondary | ICD-10-CM | POA: Diagnosis not present

## 2021-10-04 DIAGNOSIS — E785 Hyperlipidemia, unspecified: Secondary | ICD-10-CM

## 2021-10-04 DIAGNOSIS — I5021 Acute systolic (congestive) heart failure: Secondary | ICD-10-CM

## 2021-10-04 MED ORDER — METOPROLOL SUCCINATE ER 50 MG PO TB24: ORAL_TABLET | ORAL | 2 refills | Status: DC

## 2021-10-04 MED ORDER — DILTIAZEM HCL ER COATED BEADS 120 MG PO CP24: ORAL_CAPSULE | ORAL | 1 refills | Status: DC

## 2021-10-04 MED ORDER — DILTIAZEM HCL 30 MG PO TABS
60.0000 mg | ORAL_TABLET | Freq: Once | ORAL | Status: AC
Start: 2021-10-04 — End: 2021-10-04
  Administered 2021-10-04: 60 mg via ORAL

## 2021-10-04 NOTE — Patient Instructions (Addendum)
Medication Instructions:  Your physician has recommended you make the following change in your medication:   1) INCREASE metoprolol up to 100 mg daily in the morning  2) RESTART diltiazem ER 120 mg: - take 1 capsule by mouth once daily at night  You were given short acting diltiazem 60 mg x 1 dose in the office today Please take your long acting diltiazem 120 mg dose tonight  If you need a refill on your cardiac medications before your next appointment, please call your pharmacy.    Lab work: No new labs needed   Testing/Procedures: No new testing needed   Follow-Up: At [facility], you and your health needs are our priority.  As part of our continuing mission to provide you with exceptional heart care, we have created designated Provider Care Teams.  These Care Teams include your primary Cardiologist (physician) and Advanced Practice Providers (APPs -  Physician Assistants and Nurse Practitioners) who all work together to provide you with the care you need, when you need it.  You will need a follow up appointment in 1 month  Providers on your designated Care Team:   Murray Hodgkins, NP Christell Faith, PA-C Cadence Kathlen Mody, [place]  COVID-19 Vaccine Information can be found at: [email or web address].uk For questions related to vaccine distribution or appointments, please email [email or web address] .[email or web address] or call [phone].

## 2021-11-04 ENCOUNTER — Encounter: Payer: Self-pay | Admitting: Medical

## 2021-11-04 ENCOUNTER — Ambulatory Visit: Payer: Medicare PPO | Attending: Medical | Admitting: Medical

## 2021-11-04 VITALS — BP 116/64 | HR 81 | Ht 73.0 in | Wt 181.6 lb

## 2021-11-04 DIAGNOSIS — I10 Essential (primary) hypertension: Secondary | ICD-10-CM

## 2021-11-04 DIAGNOSIS — I429 Cardiomyopathy, unspecified: Secondary | ICD-10-CM

## 2021-11-04 DIAGNOSIS — I639 Cerebral infarction, unspecified: Secondary | ICD-10-CM | POA: Diagnosis not present

## 2021-11-04 DIAGNOSIS — I4821 Permanent atrial fibrillation: Secondary | ICD-10-CM

## 2021-11-04 DIAGNOSIS — I951 Orthostatic hypotension: Secondary | ICD-10-CM | POA: Diagnosis not present

## 2021-11-04 DIAGNOSIS — I5032 Chronic diastolic (congestive) heart failure: Secondary | ICD-10-CM

## 2021-11-04 DIAGNOSIS — E782 Mixed hyperlipidemia: Secondary | ICD-10-CM

## 2021-11-04 MED ORDER — METOPROLOL SUCCINATE ER 50 MG PO TB24: 50.0000 mg | ORAL_TABLET | Freq: Every day | ORAL | 3 refills | Status: DC

## 2021-11-04 NOTE — Progress Notes (Signed)
Cardiology Office Note:    Date:  11/04/2021   ID:  Gary Dawson, DOB 04-16-1940, MRN [id number]  PCP:  Gracelyn Nurse, MD  [facility] Cardiologist:  Julien Nordmann, MD  [facility] Electrophysiologist:  None   Referring MD: Gracelyn Nurse, MD   Chief Complaint: 1 month follow-up  History of Present Illness:    Gary Dawson is a 81 y.o. male with a hx of Afib, CVA, HLD, OSA on CPAP,  and cardiomyopathy who presents for 1 month follow-up.   His history of A. fib dates back to approximately 2008.  He was admitted with a CVA involving the left posterior parietal lobe in 06/2008 with echo at that time showing EF greater than 55%, mildly dilated left atrium, mild LVH, mild to moderate TR, and mild to moderate mitral regurgitation.  Carotid artery ultrasound showed no significant plaque.  Treadmill stress echo in 03/2012 showed no wall motion abnormalities concerning for ischemia with good exercise tolerance.   He was seen in 10/2014 for chest pain with subsequent nuclear stress test showing a medium defect of severe severity present in the mid inferoseptal, mid inferior, and apical inferior location consistent with prior MI and peri-infarct ischemia with an EF of 30 to 44%.  Overall, this was an intermediate risk study.  He was seen in follow-up later that month by primary cardiologist and was feeling better.  No further testing ordered.   He was seen in the office in 11/2019 and noted to be in A. fib with RVR with ventricular 133 bpm and BP 130/80.  He had not yet taken his diltiazem that morning.  No changes were made.  He subsequently underwent previously scheduled echo ordered for lower leg edema on 05/18/2020 which showed an EF of 45%, global hypokinesis, moderately reduced RV systolic function with mildly enlarged RV cavity size, mildly elevated PASP, moderately dilated left atrium, severely dilated right atrium, degenerative mitral valve with trivial regurgitation  and moderate mitral annular calcification, severe tricuspid regurgitation, mild aortic valve insufficiency, mild to moderate aortic valve sclerosis without evidence of stenosis, dilated aortic root and ascending aorta, estimated right atrial pressure of 15 mmHg, and evidence of an ASD with predominantly left to right shunting across the atrial septum. Diltiazem was discontinued and lasix was increased.   Last seen 03/08/21 and was doing well from a cardiac perspective, however there was unclear medication compliance. Metoprolol 100mg  recommended in the am and Diltiazem 120mg  in the evening.  Today, the patient reports lightheadedness that occurs daily. It is worse when he stands up and walks. He uses a cane when he goes out, but does not use it around the house. He denies chest pain, shortness of breath, orthopnea or pnd. He has stable LLE. He uses tight socks for the swelling and elevates his feet. He also follows low salt diet. He takes Toprol 75mg  in the am and diltiazem 120 in the pm. He feels maybe the symptoms are due to untreated OSA since he has not been using his CPAP nightly.    Past Medical History:  Diagnosis Date   Atypical chest pain    a. 03/2012 St echo: nl EF, no wma's.   CVA (cerebral vascular accident) (HCC)    Depression    Dysrhythmia    Elevated PSA    Essential hypertension    Hyperlipidemia    MVP (mitral valve prolapse)    Persistent atrial fibrillation (HCC)    a. CHA2DS2VASc = 4-->chronic Xarelto.  Psoriasis    Reflux    Sleep apnea    Vitamin B 12 deficiency     Past Surgical History:  Procedure Laterality Date   COLONOSCOPY WITH PROPOFOL     CYSTOSCOPY WITH LITHOLAPAXY N/A 11/01/2018   Procedure: CYSTOSCOPY WITH LITHOLAPAXY;  Surgeon: [name], MD;  Location: [facility] ORS;  Service: Urology;  Laterality: N/A;   HERNIA REPAIR     HOLEP-LASER ENUCLEATION OF THE PROSTATE WITH MORCELLATION N/A 11/01/2018   Procedure: HOLEP-LASER ENUCLEATION OF THE PROSTATE  WITH MORCELLATION;  Surgeon: [name], MD;  Location: [facility] ORS;  Service: Urology;  Laterality: N/A;   TONSILLECTOMY     VARICOSE VEIN SURGERY      Current Medications: Current Meds  Medication Sig   diltiazem (CARDIZEM CD) 120 MG 24 hr capsule Take 1 capsule (120 mg) by mouth once daily at night   furosemide (LASIX) 20 MG tablet TAKE 1 TABLET EVERY DAY   hydroxypropyl methylcellulose / hypromellose (ISOPTO TEARS / GONIOVISC) 2.5 % ophthalmic solution Place 1 drop into both eyes 2 (two) times daily as needed for dry eyes.   VITAMIN D, ERGOCALCIFEROL, PO Take 1,000 Int'l Units by mouth daily.   XARELTO 20 MG TABS tablet TAKE 1 TABLET EVERY DAY WITH SUPPER   [DISCONTINUED] metoprolol succinate (TOPROL-XL) 50 MG 24 hr tablet Take 2 tablets (100 mg) by mouth once daily in the morning     Allergies:   Other   Social History   Socioeconomic History   Marital status: Divorced    Spouse name: Not on file   Number of children: Not on file   Years of education: Not on file   Highest education level: Not on file  Occupational History   Not on file  Tobacco Use   Smoking status: Never   Smokeless tobacco: Never  Vaping Use   Vaping Use: Never used  Substance and Sexual Activity   Alcohol use: Yes    Alcohol/week: 1.0 standard drink of alcohol    Types: 1 Glasses of wine per week    Comment: social    Drug use: No   Sexual activity: Not Currently  Other Topics Concern   Not on file  Social History Narrative   Not on file   Social Determinants of Health   Financial Resource Strain: Not on file  Food Insecurity: Not on file  Transportation Needs: Not on file  Physical Activity: Not on file  Stress: Not on file  Social Connections: Not on file     Family History: The patient's family history includes Heart attack in his father; Hyperlipidemia in his father; Hypertension in his father.  ROS:   Please see the history of present illness.     All other systems reviewed  and are negative.  EKGs/Labs/Other Studies Reviewed:    The following studies were reviewed today:  Myoview lexsican 08/2020 Narrative & Impression  Abnormal, potentially high risk pharmacologic myocardial perfusion stress test. There is a moderate in size, moderate in severity, partially reversible defect involving the inferoseptal myocardium. The left ventricular ejection fraction is severely decreased (25%). Dense coronary artery calcification as well as aortic atherosclerosis are noted on the attenuation correction CT. Allowing for differences in technique, the inferolateral defect is similar to the prior study on 11/06/2014.    Echo 05/18/20  1. Left ventricular ejection fraction, by estimation, is 45%. The left  ventricle has mild to moderately decreased function. The left ventricle  demonstrates global hypokinesis. There is  mild left ventricular  hypertrophy. Left ventricular diastolic  parameters are indeterminate.   2. Right ventricular systolic function is moderately reduced. The right  ventricular size is mildly enlarged. There is mildly elevated pulmonary  artery systolic pressure.   3. Left atrial size was moderately dilated.   4. Right atrial size was severely dilated.   5. The mitral valve is degenerative. Trivial mitral valve regurgitation.  Moderate mitral annular calcification.   6. Tricuspid valve regurgitation is severe.   7. The aortic valve is tricuspid. There is moderate thickening of the  aortic valve. Aortic valve regurgitation is mild. Mild to moderate aortic  valve sclerosis/calcification is present, without any evidence of aortic  stenosis.   8. Aortic dilatation noted. There is mild dilatation of the aortic root  and of the ascending aorta, measuring 42 mm.   9. The inferior vena cava is dilated in size with <50% respiratory  variability, suggesting right atrial pressure of 15 mmHg.  10. Evidence of atrial level shunting detected by color flow Doppler.   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   EKG:  EKG is ordered today.  The ekg ordered today demonstrates Afib, 81bpm, nonspecific T wave changes  Recent Labs: 06/08/2021: ALT 25; BUN 24; Creatinine, Ser 0.88; Potassium 3.8; Sodium 136  Recent Lipid Panel No results found for: "CHOL", "TRIG", "HDL", "CHOLHDL", "VLDL", "LDLCALC", "LDLDIRECT"   Physical Exam:    VS:  BP 116/64 (BP Location: Right Arm, Patient Position: Sitting, Cuff Size: Normal)   Pulse 81   Ht 6\' 1"  (1.854 m)   Wt 181 lb 9.6 oz (82.4 kg)   SpO2 99%   BMI 23.96 kg/m     Wt Readings from Last 3 Encounters:  11/04/21 181 lb 9.6 oz (82.4 kg)  10/04/21 176 lb 8 oz (80.1 kg)  07/22/21 186 lb 1.1 oz (84.4 kg)     GEN:  Well nourished, well developed in no acute distress HEENT: Normal NECK: No JVD; No carotid bruits LYMPHATICS: No lymphadenopathy CARDIAC: Irreg Irreg, no murmurs, rubs, gallops RESPIRATORY:  Clear to auscultation without rales, wheezing or rhonchi  ABDOMEN: Soft, non-tender, non-distended MUSCULOSKELETAL:  No edema; No deformity  SKIN: Warm and dry NEUROLOGIC:  Alert and oriented x 3 PSYCHIATRIC:  Normal affect   ASSESSMENT:    1. Orthostasis   2. Permanent atrial fibrillation (HCC)   3. Cerebrovascular accident (CVA), unspecified mechanism (HCC)   4. Essential hypertension   5. Hyperlipidemia, mixed   6. Chronic diastolic heart failure (HCC)   7. Cardiomyopathy, unspecified type (HCC)    PLAN:    In order of problems listed above:  Orthostasis Permanent Afib He reports dizziness and lightheadedness daily when he stands up to walk. He is in rate controlled Afib and he takes metoprolol 75mg  in the am and diltiazem 120 in the pm. Orthostatics negative today, but still concerned given his symptoms. We will go down to Toprol 50mg  daily. Patient is also concerned his sleep apnea is contributing, he has not been using his CPAP  machine due to trouble fitting the mask. He will go get this looked into. We will see him back after 2 months to evaluate symptoms. Continue Xarelto for stroke ppx.   H/o CVA No new deficits. Continue Xarelto  HTN BP is good today.  HLD LDL 82. Continue Lipitor 40mg  daily.   Chronic diastolic and systolic CHF He has chronic and stable lower leg edema.  He eats low salt, elevates his legs and wears right socks. Continue lasix 20mg  daily,   Cardiomyopathy LVEF as low as 45% by echo in 05/2020. Myoview Lexsican showed no new ischemia. Patient is on diltiazem 120mg  daily and Toprol. We are decreasing Toprol to 50mg  daily for orthostatic symptoms. I am reluctant to continue GDMT given orthostasis. At follow-up may stop Diltiazem given low EF.   Disposition: Follow up in 2 month(s) with MD/APP    Signed, Janele Lague Ninfa Meeker, PA-C  11/04/2021 11:32 AM    [facility]

## 2021-11-04 NOTE — Patient Instructions (Signed)
Medication Instructions:  Your physician has recommended you make the following change in your medication:  -Decrease Toprol XL to 50 mg (1 tablet) daily   Labwork: None  Testing/Procedures: None  Follow-Up: Follow up with Cadence Furth, PA-C in 2 months.   Any Other Special Instructions Will Be Listed Below (If Applicable).     If you need a refill on your cardiac medications before your next appointment, please call your pharmacy.

## 2021-12-30 NOTE — Progress Notes (Unsigned)
Cardiology Office Note  Date:  01/02/2022   ID:  [name], [name] [birth year], MRN [id number]  PCP:  Gracelyn Nurse, MD   Chief Complaint  Patient presents with   2 month follow up     Patient c/o bilateral LE edema. Medications reviewed by the patient verbally.     HPI:  Gary Dawson is a  81 year old gentleman with  long history of atrial fibrillation dating back to at least 2008,  prior small stroke in June 2010,  hyperlipidemia,  OSA, on CPAP Echo 5/22: Ef 45% who presents  for routine followup of his permanent atrial fibrillation, cardiomyopathy  LOV with myself September 2023 Gets tired with activity Sedentary, no exercise  Some leg swelling above the sock line 1-2+ noted Better with leg elevation and compression socks  On diltiazem and metoprolol, Rate controlled  On CPAP  Previously reported CPAP not working, mask does not fit not sleeping well  EKG personally reviewed by myself on todays visit Atrial fibrillation ventricular rate 84 bpm no ST or T wave changes  Other past medical history reviewed Echocardiogram May 2022 ejection fraction 45% Moderately reduced RV function Moderately dilated left atrium, severely dilated right atrium  office in 11/2019 and noted to be in A. fib with RVR with ventricular 133 bpm On Xarelto  Other past medical history He had a stress test, perfusion Myoview. This showed small region of perfusion defect, predominantly fixed in the inferoapical region. Low ejection fraction 35-40%, suspect secondary to conduction abnormality    in atrial fibrillation in 2008 on EKG, normal sinus rhythm in June 2010, EKG February 2014 showing atrial fibrillation   Treadmill stress echo March 2014 showed no wall motion abnormality concerning for ischemia, good exercise tolerance, peak heart rate of 214 beats per minute ? With peak systolic pressure 191/96. Resting heart rate 98 beats per minute.    Echocardiogram June 2010 showing  ejection fraction greater than 55%, mildly dilated left atrium, mild LVH, mild to moderate TR, mild to moderate MR Carotid ultrasound June 2010 showing no significant plaque MRI of the brain June 2010 showing tiny nonhemorrhagic left posterior parietal lobe infarct  PMH:   has a past medical history of Atypical chest pain, CVA (cerebral vascular accident) (HCC), Depression, Dysrhythmia, Elevated PSA, Essential hypertension, Hyperlipidemia, MVP (mitral valve prolapse), Persistent atrial fibrillation (HCC), Psoriasis, Reflux, Sleep apnea, and Vitamin B 12 deficiency.  PSH:    Past Surgical History:  Procedure Laterality Date   COLONOSCOPY WITH PROPOFOL     CYSTOSCOPY WITH LITHOLAPAXY N/A 11/01/2018   Procedure: CYSTOSCOPY WITH LITHOLAPAXY;  Surgeon: [name], MD;  Location: [facility] ORS;  Service: Urology;  Laterality: N/A;   HERNIA REPAIR     HOLEP-LASER ENUCLEATION OF THE PROSTATE WITH MORCELLATION N/A 11/01/2018   Procedure: HOLEP-LASER ENUCLEATION OF THE PROSTATE WITH MORCELLATION;  Surgeon: [name], MD;  Location: [facility] ORS;  Service: Urology;  Laterality: N/A;   TONSILLECTOMY     VARICOSE VEIN SURGERY      Current Outpatient Medications  Medication Sig Dispense Refill   diltiazem (CARDIZEM CD) 120 MG 24 hr capsule Take 1 capsule (120 mg) by mouth once daily at night 90 capsule 1   furosemide (LASIX) 20 MG tablet TAKE 1 TABLET EVERY DAY 90 tablet 1   hydroxypropyl methylcellulose / hypromellose (ISOPTO TEARS / GONIOVISC) 2.5 % ophthalmic solution Place 1 drop into both eyes 2 (two) times daily as needed for dry eyes.     metoprolol succinate (  TOPROL-XL) 50 MG 24 hr tablet Take 1 tablet (50 mg total) by mouth daily. 90 tablet 3   VITAMIN D, ERGOCALCIFEROL, PO Take 1,000 Int'l Units by mouth daily.     XARELTO 20 MG TABS tablet TAKE 1 TABLET EVERY DAY WITH SUPPER 90 tablet 1   gabapentin (NEURONTIN) 100 MG capsule Take 1 capsule by mouth 3 (three) times daily. (Patient not  taking: Reported on 10/04/2021)     predniSONE (DELTASONE) 20 MG tablet Take 2 tablets (40 mg total) by mouth daily. (Patient not taking: Reported on 10/04/2021) 10 tablet 0   Vibegron 75 MG TABS Take 75 mg by mouth daily. (Patient not taking: Reported on 10/04/2021) 30 tablet 11   No current facility-administered medications for this visit.    Allergies:   Other   Social History:  The patient  reports that he has never smoked. He has never used smokeless tobacco. He reports current alcohol use of about 1.0 standard drink of alcohol per week. He reports that he does not use drugs.   Family History:   family history includes Heart attack in his father; Hyperlipidemia in his father; Hypertension in his father.    Review of Systems: Review of Systems  Constitutional: Negative.   Respiratory: Negative.    Cardiovascular: Negative.   Gastrointestinal: Negative.   Musculoskeletal: Negative.   Neurological:  Positive for dizziness.       Balance problems  Psychiatric/Behavioral: Negative.    All other systems reviewed and are negative.   PHYSICAL EXAM: VS:  BP 120/60 (BP Location: Left Arm, Patient Position: Sitting, Cuff Size: Normal)   Pulse 84   Ht 6\' 1"  (1.854 m)   Wt 194 lb 2 oz (88.1 kg)   SpO2 98%   BMI 25.61 kg/m  , BMI Body mass index is 25.61 kg/m.  Constitutional:  oriented to person, place, and time. No distress.  HENT:  Head: Grossly normal Eyes:  no discharge. No scleral icterus.  Neck: No JVD, no carotid bruits  Cardiovascular: Atrial fibrillation, no murmurs appreciated 1+ edema above the sock line bilaterally lower extremities Pulmonary/Chest: Clear to auscultation bilaterally, no wheezes or rails Abdominal: Soft.  no distension.  no tenderness.  Musculoskeletal: Normal range of motion Neurological:  normal muscle tone. Coordination normal. No atrophy Skin: Skin warm and dry Psychiatric: normal affect, pleasant  Recent Labs: 06/08/2021: ALT 25; BUN 24;  Creatinine, Ser 0.88; Potassium 3.8; Sodium 136    Lipid Panel No results found for: "CHOL", "HDL", "LDLCALC", "TRIG"    Wt Readings from Last 3 Encounters:  01/02/22 194 lb 2 oz (88.1 kg)  11/04/21 181 lb 9.6 oz (82.4 kg)  10/04/21 176 lb 8 oz (80.1 kg)     ASSESSMENT AND PLAN:  [place] atrial fibrillation (HCC) Rate well-controlled on metoprolol succinate 50 with diltiazem, no changes made  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) No recent TIA or stroke symptoms Remains on anticoagulation, Xarelto  Essential hypertension Continue diltiazem 120 with metoprolol succinate 50 Metoprolol dose was decreased for dizziness on prior clinic visit  Mixed hyperlipidemia Monitored through primary care, numbers at goal  Chronic diastolic and systolic CHF continue Lasix 20 mg daily Stable BMP  Cardiomyopathy Mildly depressed ejection fraction suspected secondary to underlying atrial fibrillation Myoview fixed defect 8/22 Prior stress test with no ischemia  Shortness of breath Stay on lasix 20 daily ,recommended regular walking program for conditioning and leg strengthening  OSA, on CPAP Stressed the importance of staying  on his CPAP   Total encounter time more than 30 minutes  Greater than 50% was spent in counseling and coordination of care with the patient    Orders Placed This Encounter  Procedures   EKG 12-Lead     Signed, Dossie Arbour, M.D., Ph.D. 01/02/2022  [facility] [place], [place] [phone]

## 2022-01-02 ENCOUNTER — Encounter: Payer: Self-pay | Admitting: Cardiovascular Disease

## 2022-01-02 ENCOUNTER — Ambulatory Visit: Payer: Medicare PPO | Attending: Cardiovascular Disease | Admitting: Cardiovascular Disease

## 2022-01-02 VITALS — BP 120/60 | HR 84 | Ht 73.0 in | Wt 194.1 lb

## 2022-01-02 DIAGNOSIS — I639 Cerebral infarction, unspecified: Secondary | ICD-10-CM

## 2022-01-02 DIAGNOSIS — R0602 Shortness of breath: Secondary | ICD-10-CM

## 2022-01-02 DIAGNOSIS — I10 Essential (primary) hypertension: Secondary | ICD-10-CM

## 2022-01-02 DIAGNOSIS — I77810 Thoracic aortic ectasia: Secondary | ICD-10-CM

## 2022-01-02 DIAGNOSIS — E782 Mixed hyperlipidemia: Secondary | ICD-10-CM

## 2022-01-02 DIAGNOSIS — I5032 Chronic diastolic (congestive) heart failure: Secondary | ICD-10-CM

## 2022-01-02 DIAGNOSIS — I429 Cardiomyopathy, unspecified: Secondary | ICD-10-CM

## 2022-01-02 DIAGNOSIS — I4821 Permanent atrial fibrillation: Secondary | ICD-10-CM

## 2022-01-02 DIAGNOSIS — I951 Orthostatic hypotension: Secondary | ICD-10-CM

## 2022-01-02 NOTE — Patient Instructions (Signed)
Medication Instructions:  No changes  If you need a refill on your cardiac medications before your next appointment, please call your pharmacy.   Lab work: No new labs needed  Testing/Procedures: No new testing needed  Follow-Up: At [facility], you and your health needs are our priority.  As part of our continuing mission to provide you with exceptional heart care, we have created designated Provider Care Teams.  These Care Teams include your primary Cardiologist (physician) and Advanced Practice Providers (APPs -  Physician Assistants and Nurse Practitioners) who all work together to provide you with the care you need, when you need it.  You will need a follow up appointment in 12 months  Providers on your designated Care Team:   Christopher Berge, NP Ryan Dunn, PA-C Cadence Furth, PA-C  COVID-19 Vaccine Information can be found at: [email or web address] For questions related to vaccine distribution or appointments, please email [email or web address] or call [phone].   

## 2022-01-16 DIAGNOSIS — Z8673 Personal history of transient ischemic attack (TIA), and cerebral infarction without residual deficits: Secondary | ICD-10-CM | POA: Insufficient documentation

## 2022-02-02 DIAGNOSIS — G4733 Obstructive sleep apnea (adult) (pediatric): Secondary | ICD-10-CM | POA: Diagnosis not present

## 2022-02-08 ENCOUNTER — Encounter: Payer: Self-pay | Admitting: Emergency Medicine

## 2022-02-08 ENCOUNTER — Ambulatory Visit
Admission: EM | Admit: 2022-02-08 | Discharge: 2022-02-08 | Disposition: A | Discharge status: Home / Self Care | Payer: Medicare HMO | Attending: Family Medicine | Admitting: Family Medicine

## 2022-02-08 DIAGNOSIS — Z7901 Long term (current) use of anticoagulants: Secondary | ICD-10-CM | POA: Diagnosis not present

## 2022-02-08 DIAGNOSIS — Z8673 Personal history of transient ischemic attack (TIA), and cerebral infarction without residual deficits: Secondary | ICD-10-CM | POA: Diagnosis not present

## 2022-02-08 DIAGNOSIS — Z820 Family history of epilepsy and other diseases of the nervous system: Secondary | ICD-10-CM | POA: Diagnosis not present

## 2022-02-08 DIAGNOSIS — I8291 Chronic embolism and thrombosis of unspecified vein: Secondary | ICD-10-CM | POA: Diagnosis not present

## 2022-02-08 DIAGNOSIS — I10 Essential (primary) hypertension: Secondary | ICD-10-CM | POA: Diagnosis not present

## 2022-02-08 DIAGNOSIS — Z809 Family history of malignant neoplasm, unspecified: Secondary | ICD-10-CM | POA: Diagnosis not present

## 2022-02-08 DIAGNOSIS — Z008 Encounter for other general examination: Secondary | ICD-10-CM | POA: Diagnosis not present

## 2022-02-08 DIAGNOSIS — M7918 Myalgia, other site: Secondary | ICD-10-CM | POA: Insufficient documentation

## 2022-02-08 LAB — CBC WITH DIFFERENTIAL/PLATELET
Abs Immature Granulocytes: 0.02 10*3/uL (ref 0.00–0.07)
Basophils Absolute: 0 10*3/uL (ref 0.0–0.1)
Basophils Relative: 1 %
Eosinophils Absolute: 0.1 10*3/uL (ref 0.0–0.5)
Eosinophils Relative: 2 %
HCT: 36.8 % — ABNORMAL LOW (ref 39.0–52.0)
Hemoglobin: 12.7 g/dL — ABNORMAL LOW (ref 13.0–17.0)
Immature Granulocytes: 0 %
Lymphocytes Relative: 16 %
Lymphs Abs: 0.9 10*3/uL (ref 0.7–4.0)
MCH: 34.5 pg — ABNORMAL HIGH (ref 26.0–34.0)
MCHC: 34.5 g/dL (ref 30.0–36.0)
MCV: 100 fL (ref 80.0–100.0)
Monocytes Absolute: 0.7 10*3/uL (ref 0.1–1.0)
Monocytes Relative: 12 %
Neutro Abs: 3.8 10*3/uL (ref 1.7–7.7)
Neutrophils Relative %: 69 %
Platelets: 165 10*3/uL (ref 150–400)
RBC: 3.68 MIL/uL — ABNORMAL LOW (ref 4.22–5.81)
RDW: 13.4 % (ref 11.5–15.5)
WBC: 5.6 10*3/uL (ref 4.0–10.5)
nRBC: 0 % (ref 0.0–0.2)

## 2022-02-08 LAB — COMPREHENSIVE METABOLIC PANEL
ALT: 26 U/L (ref 0–44)
AST: 35 U/L (ref 15–41)
Albumin: 3.6 g/dL (ref 3.5–5.0)
Alkaline Phosphatase: 82 U/L (ref 38–126)
Anion gap: 12 (ref 5–15)
BUN: 33 mg/dL — ABNORMAL HIGH (ref 8–23)
CO2: 20 mmol/L — ABNORMAL LOW (ref 22–32)
Calcium: 8.6 mg/dL — ABNORMAL LOW (ref 8.9–10.3)
Chloride: 103 mmol/L (ref 98–111)
Creatinine, Ser: 0.86 mg/dL (ref 0.61–1.24)
GFR, Estimated: >60 mL/min (ref >60)
Glucose, Bld: 115 mg/dL — ABNORMAL HIGH (ref 70–99)
Potassium: 3.7 mmol/L (ref 3.5–5.1)
Sodium: 135 mmol/L (ref 135–145)
Total Bilirubin: 0.6 mg/dL (ref 0.3–1.2)
Total Protein: 7.3 g/dL (ref 6.5–8.1)

## 2022-02-08 LAB — SEDIMENTATION RATE: Sed Rate: 12 mm/hr (ref 0–20)

## 2022-02-08 LAB — VITAMIN B12: Vitamin B-12: 480 pg/mL (ref 180–914)

## 2022-02-08 LAB — TSH: TSH: 1.54 u[IU]/mL (ref 0.350–4.500)

## 2022-02-08 NOTE — ED Provider Notes (Signed)
[facility]    CSN: [id number] Arrival date & time: 02/08/22  1205      History   Chief Complaint Chief Complaint  Patient presents with   Generalized Body Aches    HPI Gary Dawson is a 82 y.o. male.   HPI  Gary Dawson presents for persistent  body aches for the past several months. States that yesterday was a bad day as he felt like someone beat him with a "rubber hose."   He had to use his walking cane.   Brought a new bed about 9 months ago to see if that helped but the pain returned about 3 months ago. Has had some intermittent itching. Has known sleep apnea and has trouble sleeping. Typically, wakes up 1-2 times a night and sometimes he is not able to get back to sleep.   He is on a modified keto-diet for the past couple of years. Went to an Systems developer and was told he was very sensitive to dairy therefore he has decreased his consumption.      He called his PCP but was not able to be seen therefore he came to the urgent care.        Past Medical History:  Diagnosis Date   Atypical chest pain    a. 03/2012 St echo: nl EF, no wma's.   CVA (cerebral vascular accident) ([place])    Depression    Dysrhythmia    Elevated PSA    Essential hypertension    Hyperlipidemia    MVP (mitral valve prolapse)    Persistent atrial fibrillation (HCC)    a. CHA2DS2VASc = 4-->chronic Xarelto.   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   Sleep disturbance 11/04/2013   Pure hypercholesterolemia 05/22/2013   Benign essential hypertension 05/08/2013   Hyperlipidemia  05/08/2013   Shortness of breath 05/08/2013   Permanent atrial fibrillation ([place]) 05/06/2013   CVA (cerebral vascular accident) ([place]) 05/06/2013   Benign localized prostatic hyperplasia with lower urinary tract symptoms (LUTS) 03/06/2012   Calculus in bladder 03/06/2012   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   Elevated prostate specific antigen (PSA) 03/06/2012   Incomplete emptying of bladder 03/06/2012   Kidney stone 03/06/2012   Microscopic hematuria 03/06/2012   Sleep apnea in adult 08/27/2003   Enlarged prostate 08/26/1973   Irregular heart beat 08/26/1973    Past Surgical History:  Procedure Laterality Date   COLONOSCOPY WITH PROPOFOL     CYSTOSCOPY WITH LITHOLAPAXY N/A 11/01/2018   Procedure: CYSTOSCOPY WITH LITHOLAPAXY;  Surgeon: Billey Co, MD;  Location: [facility];  Service: Urology;  Laterality: N/A;   HERNIA REPAIR     HOLEP-LASER ENUCLEATION OF THE PROSTATE WITH MORCELLATION N/A 11/01/2018   Procedure: HOLEP-LASER ENUCLEATION OF THE PROSTATE WITH MORCELLATION;  Surgeon: Billey Co, MD;  Location: [facility];  Service: Urology;  Laterality: N/A;   TONSILLECTOMY     VARICOSE VEIN SURGERY         Home Medications    Prior to Admission medications   Medication Sig Start Date End Date Taking? Authorizing Provider  diltiazem (  CARDIZEM CD) 120 MG 24 hr capsule Take 1 capsule (120 mg) by mouth once daily at night 10/04/21   Antonieta Iba, MD  furosemide (LASIX) 20 MG tablet TAKE 1 TABLET EVERY DAY 09/20/21   Dunn, Raymon Mutton, PA-C  gabapentin (NEURONTIN) 100 MG capsule Take 1 capsule by mouth 3 (three) times daily. Patient not taking: Reported on 10/04/2021 05/13/21 05/13/22  [provider]  hydroxypropyl methylcellulose / hypromellose (ISOPTO TEARS / GONIOVISC) 2.5 % ophthalmic solution Place 1 drop into both eyes 2 (two) times daily as needed for dry eyes.    [provider]  metoprolol succinate (TOPROL-XL) 50 MG 24 hr tablet Take 1 tablet (50 mg total)  by mouth daily. 11/04/21   Furth, Cadence H, PA-C  predniSONE (DELTASONE) 20 MG tablet Take 2 tablets (40 mg total) by mouth daily. Patient not taking: Reported on 10/04/2021 07/22/21   Valinda Hoar, NP  Vibegron 75 MG TABS Take 75 mg by mouth daily. Patient not taking: Reported on 10/04/2021 10/25/20   [name], MD  VITAMIN D, ERGOCALCIFEROL, PO Take 1,000 Int'l Units by mouth daily.    [provider]  XARELTO 20 MG TABS tablet TAKE 1 TABLET EVERY DAY WITH SUPPER 07/22/21   Antonieta Iba, MD    Family History Family History  Problem Relation Age of Onset   Heart attack Father    Hypertension Father    Hyperlipidemia Father     Social History Social History   Tobacco Use   Smoking status: Never   Smokeless tobacco: Never  Vaping Use   Vaping Use: Never used  Substance Use Topics   Alcohol use: Yes    Alcohol/week: 1.0 standard drink of alcohol    Types: 1 Glasses of wine per week    Comment: social    Drug use: No     Allergies   Other   Review of Systems Review of Systems: negative unless otherwise stated in HPI.      Physical Exam Triage Vital Signs ED Triage Vitals [02/08/22 1409]  Enc Vitals Group     BP 121/63     Pulse Rate 85     Resp 16     Temp 98.3 F (36.8 C)     Temp Source Oral     SpO2 97 %     Weight      Height      Head Circumference      Peak Flow      Pain Score 4     Pain Loc      Pain Edu?      Excl. in GC?    No data found.  Updated Vital Signs BP 121/63 (BP Location: Left Arm)   Pulse 85   Temp 98.3 F (36.8 C) (Oral)   Resp 16   SpO2 97%   Visual Acuity Right Eye Distance:   Left Eye Distance:   Bilateral Distance:    Right Eye Near:   Left Eye Near:    Bilateral Near:     Physical Exam GEN:     alert,  pleasant elderly male and no distress    HENT:  mucus membranes moist, no nasal discharge  EYES:   pupils equal and reactive, EOM intact NECK:  supple, baseline ROM RESP:  clear to  auscultation bilaterally, no increased work of breathing  CVS:  Irregularly irregular rhythm, , no murmur, distal pulses intact   EXT:   ROM baseline, atraumatic,  no edema, non-tender, will continue present NEURO:  normal without focal findings,  speech normal, alert and oriented x4, CN 2-12 grossly intact Skin:   warm and dry Psych: Normal affect, appropriate speech and behavior      UC Treatments / Results  Labs (all labs ordered are listed, but only abnormal results are displayed) Labs Reviewed  CBC WITH DIFFERENTIAL/PLATELET - Abnormal; Notable for the following components:      Result Value   RBC 3.68 (*)    Hemoglobin 12.7 (*)    HCT 36.8 (*)    MCH 34.5 (*)    All other components within normal limits  COMPREHENSIVE METABOLIC PANEL - Abnormal; Notable for the following components:   CO2 20 (*)    Glucose, Bld 115 (*)    BUN 33 (*)    Calcium 8.6 (*)    All other components within normal limits  TSH  SEDIMENTATION RATE  VITAMIN B12  ANA W/REFLEX IF POSITIVE    EKG   Radiology No results found.  Procedures Procedures (including critical care time)  Medications Ordered in UC Medications - No data to display  Initial Impression / Assessment and Plan / UC Course  I have reviewed the triage vital signs and the nursing notes.  Pertinent labs & imaging results that were available during my care of the patient were reviewed by me and considered in my medical decision making (see chart for details).       Patient is a 82 y.o. male  who presents for 9 months of worsening body aches.  Overall patient is nontoxic-appearing and afebrile.  Vital signs stable. He is afebrile. VSS.  Cardiopulmonary and neurological exams are unremarkable.   Etiology of patient's body aches is unclear and prognosis uncertain at this time.  On chart review, he has history of an elevated prostate specific antigen.  Patient reports this most recent ones were normal.  He had a colonoscopy  last in 2015.  I do not see the results.  Patient reports this was also normal.  He has no history of smoking therefore doubt this is lung cancer.  He does have some metabolic acidosis but he is also likely in ketosis.  He has a almost macrocytic anemia.  Hemoglobin is stable however at 12.7, MCV 100.   TSH, CBC, CMP, ANA, ESR and vitamin B12 to look for metabolic, autoimmune and inflammatory causes of his body aches.  If his thyroid function, ANA, ESR and B12 are normal, I am unsure the cause of his body aches.  He is 82 years old and this may be diffuse arthritis.  He was advised to follow-up with his primary care provider.  ED and return precautions given and patient voiced understanding. Discussed MDM, treatment plan and plan for follow-up with patient who agrees with plan.     Final Clinical Impressions(s) / UC Diagnoses   Final diagnoses:  Myalgia, multiple sites     Discharge Instructions      Your initial blood work does not show cause of your body aches thus far.  You still have several blood tests remaining.  Someone will contact you to discuss any significant abnormalities.  Be sure to schedule a follow up visit with your primary care provider for ongoing evaluation.       ED Prescriptions   None    PDMP not reviewed this encounter.   Lyndee Hensen, DO 02/10/22 0901

## 2022-02-08 NOTE — ED Triage Notes (Signed)
For several months patient has been dealing with bodyaches. He purchased a new bed and it helped for a while but the symptoms returned. He has had to use his cane more often.

## 2022-02-08 NOTE — Discharge Instructions (Signed)
Your initial blood work does not show cause of your body aches thus far.  You still have several blood tests remaining.  Someone will contact you to discuss any significant abnormalities.  Be sure to schedule a follow up visit with your primary care provider for ongoing evaluation.

## 2022-02-09 LAB — ANA W/REFLEX IF POSITIVE: Anti Nuclear Antibody (ANA): NEGATIVE

## 2022-02-14 ENCOUNTER — Telehealth: Payer: Self-pay | Admitting: Cardiovascular Disease

## 2022-02-14 MED ORDER — METOPROLOL SUCCINATE ER 50 MG PO TB24: 50.0000 mg | ORAL_TABLET | Freq: Every day | ORAL | 2 refills | Status: DC

## 2022-02-14 NOTE — Telephone Encounter (Signed)
Requested Prescriptions   Signed Prescriptions Disp Refills   metoprolol succinate (TOPROL-XL) 50 MG 24 hr tablet 90 tablet 2    Sig: Take 1 tablet (50 mg total) by mouth daily.    Authorizing Provider: Minna Merritts    Ordering User: Raelene Bott, Habeeb Puertas L

## 2022-02-14 NOTE — Telephone Encounter (Signed)
*  STAT* If patient is at the pharmacy, call can be transferred to refill team.   1. Which medications need to be refilled? (please list name of each medication and dose if known) metoprolol succinate (TOPROL-XL) 50 MG 24 hr tablet   2. Which pharmacy/location (including street and city if local pharmacy) is medication to be sent to? [facility] [place] [place] [facility]    3. Do they need a 30 day or 90 day supply? 90 day  Patient requesting refills be sent to his new mail order pharmacy.

## 2022-02-20 ENCOUNTER — Telehealth: Payer: Self-pay | Admitting: Cardiovascular Disease

## 2022-02-20 ENCOUNTER — Other Ambulatory Visit: Payer: Self-pay

## 2022-02-20 MED ORDER — DILTIAZEM HCL ER COATED BEADS 120 MG PO CP24: ORAL_CAPSULE | ORAL | 0 refills | Status: DC

## 2022-02-20 MED ORDER — DILTIAZEM HCL ER COATED BEADS 120 MG PO CP24: ORAL_CAPSULE | ORAL | 2 refills | Status: DC

## 2022-02-20 MED ORDER — METOPROLOL SUCCINATE ER 50 MG PO TB24: 50.0000 mg | ORAL_TABLET | Freq: Every day | ORAL | 3 refills | Status: DC

## 2022-02-20 NOTE — Telephone Encounter (Signed)
*  STAT* If patient is at the pharmacy, call can be transferred to refill team.   1. Which medications need to be refilled? (please list name of each medication and dose if known)  diltiazem (CARDIZEM CD) 120 MG 24 hr capsule   2. Which pharmacy/location (including street and city if local pharmacy) is medication to be sent to? [id number] [place] [place]   3. Do they need a 30 day or 90 day supply? 30 day supply     Requesting 30 day supply to hold him through until mail service prescription arrives. Please advise.

## 2022-02-20 NOTE — Telephone Encounter (Signed)
*  STAT* If patient is at the pharmacy, call can be transferred to refill team.   1. Which medications need to be refilled? (please list name of each medication and dose if known) New presxription for Metoprolol- changed pharmacy  2. Which pharmacy/location (including street and city if local pharmacy) is medication to be sent to? [facility]  [place]  3. Do they need a 30 day or 90 day supply? 90 days and refills

## 2022-02-20 NOTE — Telephone Encounter (Signed)
Requested Prescriptions   Signed Prescriptions Disp Refills   diltiazem (CARDIZEM CD) 120 MG 24 hr capsule 90 capsule 2    Sig: Take 1 capsule (120 mg) by mouth once daily at night    Authorizing Provider: Minna Merritts    Ordering User: Raelene Bott, Trust Leh L

## 2022-02-20 NOTE — Telephone Encounter (Signed)
metoprolol succinate (TOPROL-XL) 50 MG 24 hr tablet 90 tablet 3 02/20/2022 02/15/2023   Sig - Route: Take 1 tablet (50 mg total) by mouth daily. - Oral   Sent to pharmacy as: metoprolol succinate (TOPROL-XL) 50 MG 24 hr tablet   E-Prescribing Status: Receipt confirmed by pharmacy (02/20/2022  2:43 PM EST)    Ph[facility]acy  [id number] [place], [place] - [address]   

## 2022-02-20 NOTE — Telephone Encounter (Signed)
*  STAT* If patient is at the pharmacy, call can be transferred to refill team.   1. Which medications need to be refilled? (please list name of each medication and dose if known) diltiazem (CARDIZEM CD) 120 MG 24 hr capsule   2. Which pharmacy/location (including street and city if local pharmacy) is medication to be sent to?  [facility] [place], [place] [facility]   3. Do they need a 30 day or 90 day supply? [place]

## 2022-02-20 NOTE — Telephone Encounter (Signed)
metoprolol succinate (TOPROL-XL) 50 MG 24 hr tablet 90 tablet 3 02/20/2022 02/15/2023   Sig - Route: Take 1 tablet (50 mg total) by mouth daily. - Oral   Sent to pharmacy as: metoprolol succinate (TOPROL-XL) 50 MG 24 hr tablet   E-Prescribing Status: Receipt confirmed by pharmacy (02/20/2022  2:43 PM EST)    [facility]acy  [id number] [place], [place]

## 2022-02-20 NOTE — Telephone Encounter (Signed)
Requested Prescriptions   Signed Prescriptions Disp Refills   diltiazem (CARDIZEM CD) 120 MG 24 hr capsule 30 capsule 0    Sig: Take 1 capsule (120 mg) by mouth once daily at night    Authorizing Provider: Minna Merritts    Ordering User: Raelene Bott, Khaleb Broz L

## 2022-02-20 NOTE — Telephone Encounter (Signed)
This is Dr. Gollan's pt. °

## 2022-02-24 ENCOUNTER — Telehealth: Payer: Self-pay | Admitting: Cardiovascular Disease

## 2022-02-24 MED ORDER — DILTIAZEM HCL ER COATED BEADS 120 MG PO CP24: ORAL_CAPSULE | ORAL | 3 refills | Status: DC

## 2022-02-24 MED ORDER — METOPROLOL SUCCINATE ER 50 MG PO TB24: 50.0000 mg | ORAL_TABLET | Freq: Every day | ORAL | 0 refills | Status: DC

## 2022-02-24 MED ORDER — DILTIAZEM HCL ER COATED BEADS 120 MG PO CP24: ORAL_CAPSULE | ORAL | 0 refills | Status: DC

## 2022-02-24 NOTE — Telephone Encounter (Signed)
*  STAT* If patient is at the pharmacy, call can be transferred to refill team.   1. Which medications need to be refilled? (please list name of each medication and dose if known) metoprolol succinate (TOPROL-XL) 50 MG 24 hr tablet   diltiazem (CARDIZEM CD) 120 MG 24 hr capsule  2. Which pharmacy/location (including street and city if local pharmacy) is medication to be sent to? [id number] [place] [place] - [place]   3. Do they need a 30 day or 90 day supply? 30    *STAT* If patient is at the pharmacy, call can be transferred to refill team.   1. Which medications need to be refilled? (please list name of each medication and dose if known) diltiazem (CARDIZEM CD) 120 MG 24 hr capsule   2. Which pharmacy/location (including street and city if local pharmacy) is medication to be sent to? [facility] [place], [place] [facility]   3. Do they need a 30 day or 90 day supply? [place]

## 2022-02-24 NOTE — Telephone Encounter (Signed)
Requested Prescriptions   Signed Prescriptions Disp Refills   metoprolol succinate (TOPROL-XL) 50 MG 24 hr tablet 30 tablet 0    Sig: Take 1 tablet (50 mg total) by mouth daily.    Authorizing Provider: Minna Merritts    Ordering User: Othelia Pulling C   diltiazem (CARDIZEM CD) 120 MG 24 hr capsule 90 capsule 3    Sig: Take 1 capsule (120 mg) by mouth once daily at night    Authorizing Provider: Minna Merritts    Ordering User: Britt Bottom

## 2022-03-05 DIAGNOSIS — G4733 Obstructive sleep apnea (adult) (pediatric): Secondary | ICD-10-CM | POA: Diagnosis not present

## 2022-03-08 NOTE — Progress Notes (Unsigned)
03/09/2022 9:08 AM   Gary Dawson 1940/09/26 [id number]  Referring provider: Baxter Hire, MD [place],  [place] [address]  Urological history: 1. BPH w/ LU TS -HoLEP (2020) -pathology benign -I PSS 12/6 -PVR 0 mL  2. Nocturia -Risk factors for nocturia: obstructive sleep apnea, hypertension, arthritis, cognitive dysfunction, alcohol consumption and BPH -PVR 0 mL  3. OAB -Contributing factors of age, obstructive sleep apnea, hypertension, cognitive dysfunction, BPH, diuretics and alcohol consumption -PVR 0 mL    Chief Complaint  Patient presents with   Benign Prostatic Hypertrophy   Over Active Bladder   Nocturia    HPI: Gary Dawson is a 82 y.o. male who presents today for frequent urination and weak output.  I PSS 12/6  PVR 0 mL   He is having to go every two hours during the day and night.  He is having severe night time incontinence.  He is not sleeping with his CPAP machine.   He did not find the Myrbetriq or the Gemtesa effective in controlling these symptoms.   He does not like water and drinks mostly green tea.  He consumes artificial sweeteners.    Patient denies any modifying or aggravating factors.  Patient denies any Dawson hematuria, dysuria or suprapubic/flank pain.  Patient denies any fevers, chills, nausea or vomiting.    UA yellow clear, specific gravity 1.025, pH 5.0, 0-5 WBC's, 0-2 RBC's, 0-10 epithelial cells and few bacteria   IPSS     Row Name 03/09/22 0800         International Prostate Symptom Score   How often have you had the sensation of not emptying your bladder? Not at All     How often have you had to urinate less than every two hours? More than half the time     How often have you found you stopped and started again several times when you urinated? Not at All     How often have you found it difficult to postpone urination? More than half the time     How often have you had a weak urinary  stream? Less than 1 in 5 times     How often have you had to strain to start urination? Not at All     How many times did you typically get up at night to urinate? 3 Times     Total IPSS Score 12       Quality of Life due to urinary symptoms   If you were to spend the rest of your life with your urinary condition just the way it is now how would you feel about that? Terrible              Score:  1-7 Mild 8-19 Moderate 20-35 Severe    PMH: Past Medical History:  Diagnosis Date   Atypical chest pain    a. 03/2012 St echo: nl EF, no wma's.   CVA (cerebral vascular accident) ([place])    Depression    Dysrhythmia    Elevated PSA    Essential hypertension    Hyperlipidemia    MVP (mitral valve prolapse)    Persistent atrial fibrillation (HCC)    a. CHA2DS2VASc = 4-->chronic Xarelto.   Psoriasis    Reflux    Sleep apnea    Vitamin B 12 deficiency     Surgical History: Past Surgical History:  Procedure Laterality Date   COLONOSCOPY WITH PROPOFOL     CYSTOSCOPY  WITH LITHOLAPAXY N/A 11/01/2018   Procedure: CYSTOSCOPY WITH LITHOLAPAXY;  Surgeon: Billey Co, MD;  Location: [facility];  Service: Urology;  Laterality: N/A;   HERNIA REPAIR     HOLEP-LASER ENUCLEATION OF THE PROSTATE WITH MORCELLATION N/A 11/01/2018   Procedure: HOLEP-LASER ENUCLEATION OF THE PROSTATE WITH MORCELLATION;  Surgeon: Billey Co, MD;  Location: [facility];  Service: Urology;  Laterality: N/A;   TONSILLECTOMY     VARICOSE VEIN SURGERY      Home Medications:  Allergies as of 03/09/2022       Reactions   Other Other (See Comments)   Trees and grasses        Medication List        Accurate as of March 09, 2022  9:08 AM. If you have any questions, ask your nurse or doctor.          STOP taking these medications    furosemide 20 MG tablet Commonly known as: LASIX   gabapentin 100 MG capsule Commonly known as: NEURONTIN   predniSONE 20 MG tablet Commonly known as:  DELTASONE   Vibegron 75 MG Tabs       TAKE these medications    diltiazem 120 MG 24 hr capsule Commonly known as: CARDIZEM CD Take 1 capsule (120 mg) by mouth once daily at night   hydroxypropyl methylcellulose / hypromellose 2.5 % ophthalmic solution Commonly known as: ISOPTO TEARS / GONIOVISC Place 1 drop into both eyes 2 (two) times daily as needed for dry eyes.   metoprolol succinate 50 MG 24 hr tablet Commonly known as: TOPROL-XL Take 1 tablet (50 mg total) by mouth daily.   mirabegron ER 50 MG Tb24 tablet Commonly known as: MYRBETRIQ Take 1 tablet (50 mg total) by mouth daily.   VITAMIN D (ERGOCALCIFEROL) PO Take 1,000 Int'l Units by mouth daily.   Xarelto 20 MG Tabs tablet Generic drug: rivaroxaban TAKE 1 TABLET EVERY DAY WITH SUPPER        Allergies:  Allergies  Allergen Reactions   Other Other (See Comments)    Trees and grasses     Family History: Family History  Problem Relation Age of Onset   Heart attack Father    Hypertension Father    Hyperlipidemia Father     Social History:  reports that he has never smoked. He has never used smokeless tobacco. He reports current alcohol use of about 1.0 standard drink of alcohol per week. He reports that he does not use drugs.  ROS: Pertinent ROS in HPI  Physical Exam: BP (!) 147/72   Pulse 98   Ht 6' 1"$  (1.854 m)   Wt 194 lb (88 kg)   BMI 25.60 kg/m   Constitutional:  Well nourished. Alert and oriented, No acute distress. HEENT: [place] AT, moist mucus membranes.  Trachea midline Cardiovascular: No clubbing, cyanosis, or edema. Respiratory: Normal respiratory effort, no increased work of breathing. Neurologic: Grossly intact, no focal deficits, moving all 4 extremities. Psychiatric: Normal mood and affect.  Laboratory Data: Lab Results  Component Value Date   WBC 5.6 02/08/2022   HGB 12.7 (L) 02/08/2022   HCT 36.8 (L) 02/08/2022   MCV 100.0 02/08/2022   PLT 165 02/08/2022    Lab Results   Component Value Date   CREATININE 0.86 02/08/2022    Lab Results  Component Value Date   TSH 1.540 02/08/2022    Lab Results  Component Value Date   AST 35 02/08/2022   Lab Results  Component  Value Date   ALT 26 02/08/2022    Urinalysis See epic and HPI I have reviewed the labs.   Pertinent Imaging:  03/09/22 08:31  Scan Result 0 ml    Assessment & Plan:    1. BPH with LUTS -UA benign  -PVR < 300 cc  -most bothersome symptoms are urgency, frequency, nocturia and night time incontinence -continue conservative management, avoiding bladder irritants and timed voiding's  2. OAB -Discussed retrying an OAB agent and or pursuing PTNS therapy -he would like to retry Myrbetriq 50 mg daily -I explained how PTNS is performed and that most people experience an 80% improvement of symptoms (meaning a 50% reduction in OAB) and given him a brochure   Return in about 3 weeks (around 03/30/2022) for IPSS and PVR.  These notes generated with voice recognition software. I apologize for typographical errors.  [place], [place] [address]  [place], [place] [address] [phone]

## 2022-03-09 ENCOUNTER — Ambulatory Visit: Payer: Medicare HMO | Admitting: Urology

## 2022-03-09 ENCOUNTER — Encounter: Payer: Self-pay | Admitting: Urology

## 2022-03-09 VITALS — BP 147/72 | HR 98 | Ht 73.0 in | Wt 194.0 lb

## 2022-03-09 DIAGNOSIS — N3944 Nocturnal enuresis: Secondary | ICD-10-CM

## 2022-03-09 DIAGNOSIS — N138 Other obstructive and reflux uropathy: Secondary | ICD-10-CM

## 2022-03-09 DIAGNOSIS — N3281 Overactive bladder: Secondary | ICD-10-CM | POA: Diagnosis not present

## 2022-03-09 DIAGNOSIS — R351 Nocturia: Secondary | ICD-10-CM | POA: Diagnosis not present

## 2022-03-09 DIAGNOSIS — R3915 Urgency of urination: Secondary | ICD-10-CM | POA: Diagnosis not present

## 2022-03-09 DIAGNOSIS — R350 Frequency of micturition: Secondary | ICD-10-CM | POA: Diagnosis not present

## 2022-03-09 DIAGNOSIS — N401 Enlarged prostate with lower urinary tract symptoms: Secondary | ICD-10-CM | POA: Diagnosis not present

## 2022-03-09 LAB — URINALYSIS, COMPLETE
Bilirubin, UA: NEGATIVE
Glucose, UA: NEGATIVE
Ketones, UA: NEGATIVE
Leukocytes,UA: NEGATIVE
Nitrite, UA: NEGATIVE
Protein,UA: NEGATIVE
RBC, UA: NEGATIVE
Specific Gravity, UA: 1.025 (ref 1.005–1.030)
Urobilinogen, Ur: 1 mg/dL (ref 0.2–1.0)
pH, UA: 5 (ref 5.0–7.5)

## 2022-03-09 LAB — MICROSCOPIC EXAMINATION

## 2022-03-09 LAB — BLADDER SCAN AMB NON-IMAGING: Scan Result: 0

## 2022-03-09 MED ORDER — MIRABEGRON ER 50 MG PO TB24: 50.0000 mg | ORAL_TABLET | Freq: Every day | ORAL | 0 refills | Status: DC

## 2022-03-27 ENCOUNTER — Telehealth: Payer: Self-pay

## 2022-03-27 ENCOUNTER — Other Ambulatory Visit: Payer: Self-pay | Admitting: Cardiovascular Disease

## 2022-03-27 NOTE — Telephone Encounter (Signed)
Patient called to let us know that he is doing great on Myrbetriq samples but has to take it every day if he forgets he can tell. He has follow up on 03/30/22 and I reminded him of that appointment. If anything else is needed to be done or changed before his appointment to please let him know but he is doing great on the samples.

## 2022-03-27 NOTE — Telephone Encounter (Signed)
Patient advised.

## 2022-03-29 NOTE — Progress Notes (Signed)
03/30/2022 9:33 AM   Gary Dawson [birth year] [id number]  Referring provider: Gracelyn Nurse, MD [address] [place],  [place] [address]  Urological history: 1. BPH w/ LU TS -HoLEP (2020) -pathology benign -I PSS 3/3 -PVR 15 mL  2. Nocturia -Risk factors for nocturia: obstructive sleep apnea, hypertension, arthritis, cognitive dysfunction, alcohol consumption and BPH -PVR 15  mL  3. OAB -Contributing factors of age, obstructive sleep apnea, hypertension, cognitive dysfunction, BPH, diuretics and alcohol consumption -PVR 15 mL    Chief Complaint  Patient presents with   Benign Prostatic Hypertrophy   Over Active Bladder    HPI: Gary Dawson is a 82 y.o. male who presents today for 3 week follow up after trial Myrbetriq 50 mg daily.  At his visit on 03/09/2022, I PSS 12/6.  PVR 0 mL.  He is having to go every two hours during the day and night.  He is having severe night time incontinence.  He is not sleeping with his CPAP machine.   He did not find the Myrbetriq or the Gemtesa effective in controlling these symptoms.   He does not like water and drinks mostly green tea.  He consumes artificial sweeteners.  UA yellow clear, specific gravity 1.025, pH 5.0, 0-5 WBC's, 0-2 RBC's, 0-10 epithelial cells and few bacteria.  He has noticed a decrease in his frequency and was able to anticipate the need to void more readily.  "The first week on the samples were Nirvana, but it weaned off, but still better than before."  He also has started to wear a new depends product at the time that he started the Myrbetriq.  He stated that he was able to go 2 days without having to change the pad because he did not have any accidents.  Patient denies any modifying or aggravating factors.  Patient denies any gross hematuria, dysuria or suprapubic/flank pain.  Patient denies any fevers, chills, nausea or vomiting.    I PSS 3/3  PVR 15 mL    IPSS     Row Name  03/30/22 0900         International Prostate Symptom Score   How often have you had the sensation of not emptying your bladder? Not at All     How often have you had to urinate less than every two hours? Less than 1 in 5 times     How often have you found you stopped and started again several times when you urinated? Not at All     How often have you found it difficult to postpone urination? Not at All     How often have you had a weak urinary stream? Less than 1 in 5 times     How often have you had to strain to start urination? Not at All     How many times did you typically get up at night to urinate? 1 Time     Total IPSS Score 3       Quality of Life due to urinary symptoms   If you were to spend the rest of your life with your urinary condition just the way it is now how would you feel about that? Mixed               Score:  1-7 Mild 8-19 Moderate 20-35 Severe    PMH: Past Medical History:  Diagnosis Date   Atypical chest pain    a. 03/2012 St echo: nl EF,  no wma's.   CVA (cerebral vascular accident) (HCC)    Depression    Dysrhythmia    Elevated PSA    Essential hypertension    Hyperlipidemia    MVP (mitral valve prolapse)    Persistent atrial fibrillation (HCC)    a. CHA2DS2VASc = 4-->chronic Xarelto.   Psoriasis    Reflux    Sleep apnea    Vitamin B 12 deficiency     Surgical History: Past Surgical History:  Procedure Laterality Date   COLONOSCOPY WITH PROPOFOL     CYSTOSCOPY WITH LITHOLAPAXY N/A 11/01/2018   Procedure: CYSTOSCOPY WITH LITHOLAPAXY;  Surgeon: [name], MD;  Location: [facility];  Service: Urology;  Laterality: N/A;   HERNIA REPAIR     HOLEP-LASER ENUCLEATION OF THE PROSTATE WITH MORCELLATION N/A 11/01/2018   Procedure: HOLEP-LASER ENUCLEATION OF THE PROSTATE WITH MORCELLATION;  Surgeon: [name], MD;  Location: [facility];  Service: Urology;  Laterality: N/A;   TONSILLECTOMY     VARICOSE VEIN SURGERY      Home  Medications:  Allergies as of 03/30/2022       Reactions   Other Other (See Comments)   Trees and grasses        Medication List        Accurate as of March 30, 2022  9:33 AM. If you have any questions, ask your Dawson or doctor.          diltiazem 120 MG 24 hr capsule Commonly known as: CARDIZEM CD Take 1 capsule (120 mg) by mouth once daily at night   hydroxypropyl methylcellulose / hypromellose 2.5 % ophthalmic solution Commonly known as: ISOPTO TEARS / GONIOVISC Place 1 drop into both eyes 2 (two) times daily as needed for dry eyes.   metoprolol succinate 50 MG 24 hr tablet Commonly known as: TOPROL-XL Take 1 tablet (50 mg total) by mouth daily.   mirabegron ER 50 MG Tb24 tablet Commonly known as: MYRBETRIQ Take 1 tablet (50 mg total) by mouth daily.   VITAMIN D (ERGOCALCIFEROL) PO Take 1,000 Int'l Units by mouth daily.   Xarelto 20 MG Tabs tablet Generic drug: rivaroxaban TAKE 1 TABLET EVERY DAY WITH SUPPER        Allergies:  Allergies  Allergen Reactions   Other Other (See Comments)    Trees and grasses     Family History: Family History  Problem Relation Age of Onset   Heart attack Father    Hypertension Father    Hyperlipidemia Father     Social History:  reports that he has never smoked. He has never used smokeless tobacco. He reports current alcohol use of about 1.0 standard drink of alcohol per week. He reports that he does not use drugs.  ROS: Pertinent ROS in HPI  Physical Exam: BP 134/72   Pulse 83   Ht 6\' 1"  (1.854 m)   Wt 194 lb (88 kg)   BMI 25.60 kg/m   Constitutional:  Well nourished. Alert and oriented, No acute distress. HEENT: [place] AT, moist mucus membranes.  Trachea midline Cardiovascular: No clubbing, cyanosis, or edema. Respiratory: Normal respiratory effort, no increased work of breathing. Neurologic: Grossly intact, no focal deficits, moving all 4 extremities. Psychiatric: Normal mood and affect.   Laboratory  Data: N/A  Pertinent Imaging:  03/30/22 09:15  Scan Result 15 ml     Assessment & Plan:    1. BPH with LUTS -PVR < 300 cc  -continue conservative management, avoiding bladder irritants and timed  voiding's  2. OAB -He believes it is the new depends that is controlling his bladder symptoms as he does not want to continue the Myrbetriq -I sent a prescription in for the Myrbetriq regardless as the symptoms are probably returning in a few weeks and he will want to restart the medicine  Return in about 1 year (around 03/30/2023) for IPSS and PVR.  These notes generated with voice recognition software. I apologize for typographical errors.  Cloretta Ned  [facility] [address]  [address] [place], [place] [address] [phone]

## 2022-03-30 ENCOUNTER — Ambulatory Visit: Payer: Medicare HMO | Admitting: Urology

## 2022-03-30 ENCOUNTER — Encounter: Payer: Self-pay | Admitting: Urology

## 2022-03-30 VITALS — BP 134/72 | HR 83 | Ht 73.0 in | Wt 194.0 lb

## 2022-03-30 DIAGNOSIS — N401 Enlarged prostate with lower urinary tract symptoms: Secondary | ICD-10-CM

## 2022-03-30 DIAGNOSIS — N3281 Overactive bladder: Secondary | ICD-10-CM

## 2022-03-30 DIAGNOSIS — N138 Other obstructive and reflux uropathy: Secondary | ICD-10-CM

## 2022-03-30 LAB — BLADDER SCAN AMB NON-IMAGING: Scan Result: 15

## 2022-03-30 MED ORDER — MIRABEGRON ER 50 MG PO TB24: 50.0000 mg | ORAL_TABLET | Freq: Every day | ORAL | 3 refills | Status: DC

## 2022-04-03 DIAGNOSIS — G4733 Obstructive sleep apnea (adult) (pediatric): Secondary | ICD-10-CM | POA: Diagnosis not present

## 2022-04-14 ENCOUNTER — Ambulatory Visit
Admission: EM | Admit: 2022-04-14 | Discharge: 2022-04-14 | Disposition: A | Discharge status: Home / Self Care | Payer: Medicare HMO | Attending: Physician Assistant | Admitting: Physician Assistant

## 2022-04-14 ENCOUNTER — Encounter: Payer: Self-pay | Admitting: Emergency Medicine

## 2022-04-14 DIAGNOSIS — S39012A Strain of muscle, fascia and tendon of lower back, initial encounter: Secondary | ICD-10-CM

## 2022-04-14 DIAGNOSIS — M545 Low back pain, unspecified: Secondary | ICD-10-CM

## 2022-04-14 MED ORDER — BACLOFEN 10 MG PO TABS: 10.0000 mg | ORAL_TABLET | Freq: Three times a day (TID) | ORAL | 0 refills | Status: DC | PRN

## 2022-04-14 NOTE — Discharge Instructions (Addendum)

## 2022-04-14 NOTE — ED Triage Notes (Signed)
Patient c/o muscle spasm on the left side of his back that started 3 days ago. Paitnet denies injury or fall.

## 2022-04-14 NOTE — ED Provider Notes (Signed)
[facility]    CSN: [id number] Arrival date & time: 04/14/22  1726      History   Chief Complaint Chief Complaint  Patient presents with   Back Pain    HPI Gary Dawson is a 82 y.o. male presenting for left lower back pain x 3 days. States he just woke up and it was hurting the other day. Denies radiation of pain. Reports that he always had weakness of his quads and it does not seem any worse than normal. Denies numbness/tingling. Increased pain with leaning forward and trying to tie his shoes. No fever, fatigue, dysuria, urinary frequency or urgency. No abdominal pain, n/v. Denies history of back problems. Has tried heat, ice, muscle rubs and Tylenol without relief. No other concerns.  HPI  Past Medical History:  Diagnosis Date   Atypical chest pain    a. 03/2012 St echo: nl EF, no wma's.   CVA (cerebral vascular accident) ([place])    Depression    Dysrhythmia    Elevated PSA    Essential hypertension    Hyperlipidemia    MVP (mitral valve prolapse)    Persistent atrial fibrillation (HCC)    a. CHA2DS2VASc = 4-->chronic Xarelto.   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   Sleep disturbance 11/04/2013   Pure hypercholesterolemia 05/22/2013   Benign essential hypertension 05/08/2013   Hyperlipidemia 05/08/2013   Shortness of breath 05/08/2013   Permanent atrial fibrillation ([place]) 05/06/2013   CVA (cerebral vascular accident) ([place]) 05/06/2013   Benign localized prostatic hyperplasia with lower urinary tract symptoms (LUTS) 03/06/2012   Calculus in bladder  03/06/2012   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   Elevated prostate specific antigen (PSA) 03/06/2012   Incomplete emptying of bladder 03/06/2012   Kidney stone 03/06/2012   Microscopic hematuria 03/06/2012   Sleep apnea in adult 08/27/2003   Enlarged prostate 08/26/1973   Irregular heart beat 08/26/1973    Past Surgical History:  Procedure Laterality Date   COLONOSCOPY WITH PROPOFOL     CYSTOSCOPY WITH LITHOLAPAXY N/A 11/01/2018   Procedure: CYSTOSCOPY WITH LITHOLAPAXY;  Surgeon: Billey Co, MD;  Location: [facility];  Service: Urology;  Laterality: N/A;   HERNIA REPAIR     HOLEP-LASER ENUCLEATION OF THE PROSTATE WITH MORCELLATION N/A 11/01/2018   Procedure: HOLEP-LASER ENUCLEATION OF THE PROSTATE WITH MORCELLATION;  Surgeon: Billey Co, MD;  Location: [facility];  Service: Urology;  Laterality: N/A;   TONSILLECTOMY     VARICOSE VEIN SURGERY         Home Medications    Prior to Admission medications   Medication Sig Start Date End Date Taking? Authorizing Provider  baclofen (LIORESAL) 10 MG tablet Take 1 tablet (10 mg total) by mouth 3 (three) times daily as needed for muscle spasms. 04/14/22  Yes Danton Clap, PA-C  diltiazem (CARDIZEM CD) 120 MG 24 hr capsule Take 1 capsule (120 mg) by mouth once daily at night 02/24/22   Minna Merritts, MD  hydroxypropyl methylcellulose / hypromellose (ISOPTO TEARS / GONIOVISC) 2.5 % ophthalmic solution Place 1 drop into  both eyes 2 (two) times daily as needed for dry eyes.    [provider]  metoprolol succinate (TOPROL-XL) 50 MG 24 hr tablet Take 1 tablet (50 mg total) by mouth daily. 02/24/22 02/19/23  Minna Merritts, MD  mirabegron ER (MYRBETRIQ) 50 MG TB24 tablet Take 1 tablet (50 mg total) by mouth daily. 03/30/22   Zara Council A, PA-C  VITAMIN D, ERGOCALCIFEROL, PO Take 1,000 Int'l Units by mouth daily.    [provider]  XARELTO 20 MG TABS tablet TAKE 1 TABLET EVERY DAY WITH SUPPER 07/22/21   Minna Merritts, MD    Family History Family History  Problem Relation Age of Onset   Heart attack Father    Hypertension Father    Hyperlipidemia Father     Social History Social History   Tobacco Use   Smoking status: Never   Smokeless tobacco: Never  Vaping Use   Vaping Use: Never used  Substance Use Topics   Alcohol use: Yes    Alcohol/week: 1.0 standard drink of alcohol    Types: 1 Glasses of wine per week    Comment: social    Drug use: No     Allergies   Other   Review of Systems Review of Systems  Constitutional:  Negative for fatigue and fever.  Gastrointestinal:  Negative for abdominal pain, nausea and vomiting.  Genitourinary:  Negative for difficulty urinating, dysuria, flank pain and hematuria.  Musculoskeletal:  Positive for back pain. Negative for arthralgias and gait problem.  Skin:  Negative for color change, rash and wound.  Neurological:  Positive for weakness. Negative for numbness. Tremors: of quads--chronic.    Physical Exam Triage Vital Signs ED Triage Vitals  Enc Vitals Group     BP 04/14/22 1738 (!) 169/100     Pulse Rate 04/14/22 1738 77     Resp 04/14/22 1738 15     Temp 04/14/22 1738 97.9 F (36.6 C)     Temp Source 04/14/22 1738 Oral     SpO2 04/14/22 1738 98 %     Weight 04/14/22 1736 194 lb 0.1 oz (88 kg)     Height 04/14/22 1736 6\' 1"  (1.854 m)     Head Circumference --      Peak Flow --      Pain Score 04/14/22 1736 9     Pain Loc --      Pain Edu? --      Excl. in [place]? --    No data found.  Updated Vital Signs BP (!) 169/100 (BP Location: Right Arm)   Pulse 77   Temp 97.9 F (36.6 C) (Oral)   Resp 15   Ht 6\' 1"  (1.854 m)   Wt 194 lb 0.1 oz (88 kg)   SpO2 98%   BMI 25.60 kg/m      Physical Exam Vitals and nursing note reviewed.  Constitutional:      General: He is not in acute distress.    Appearance: Normal appearance. He is well-developed. He is not ill-appearing.  HENT:     Head: Normocephalic and atraumatic.   Eyes:     General: No scleral icterus.    Conjunctiva/sclera: Conjunctivae normal.  Cardiovascular:     Rate and Rhythm: Normal rate and regular rhythm.  Pulmonary:     Effort: Pulmonary effort is normal. No respiratory distress.     Breath sounds: Normal breath sounds.  Abdominal:     Palpations: Abdomen is soft.  Tenderness: There is no abdominal tenderness. There is no right CVA tenderness or left CVA tenderness.  Musculoskeletal:     Cervical back: Neck supple.     Lumbar back: Tenderness (Left paralumbar muscles) present. No bony tenderness. Decreased range of motion. Negative right straight leg raise test and negative left straight leg raise test.  Skin:    General: Skin is warm and dry.     Capillary Refill: Capillary refill takes less than 2 seconds.  Neurological:     General: No focal deficit present.     Mental Status: He is alert. Mental status is at baseline.     Motor: No weakness (slow. uses cane).     Gait: Gait abnormal.  Psychiatric:        Mood and Affect: Mood normal.        Behavior: Behavior normal.      UC Treatments / Results  Labs (all labs ordered are listed, but only abnormal results are displayed) Labs Reviewed - No data to display  EKG   Radiology No results found.  Procedures Procedures (including critical care time)  Medications Ordered in UC Medications - No data to display  Initial Impression / Assessment and Plan / UC Course  I have reviewed the triage vital signs and the nursing notes.  Pertinent labs & imaging results that were available during my care of the patient were reviewed by me and considered in my medical decision making (see chart for details).   82 y/o male presents for atraumatic left sided lower back pain x 3 days. No radiation of pain. No red flags. No improvement with OTC meds. He has reduced ROM of back and TTP left paralumbar muscles. Presentation consistent with muscle spasm/lumbar strain. Prescribed  baclofen. Advised avoiding NSAIDs since he is on an anticoagulant. Tylenol for pain and continue with OTC supportive care treatments, heat. Reviewed contacting us if pain is not tolerable on Tylenol and baclofen and I will consider Ultram. ED precautions discussed.   Final Clinical Impressions(s) / UC Diagnoses   Final diagnoses:  Acute left-sided low back pain without sciatica  Strain of lumbar region, initial encounter     Discharge Instructions      BACK PAIN: Stressed avoiding painful activities . RICE (REST, ICE, COMPRESSION, ELEVATION) guidelines reviewed. May alternate ice and heat. Consider use of muscle rubs, Salonpas patches, etc. Use medications as directed including muscle relaxers if prescribed. Take anti-inflammatory medications as prescribed or OTC NSAIDs/Tylenol.  F/u with PCP in 7-10 days for reexamination, and please feel free to call or return to the urgent care at any time for any questions or concerns you may have and we will be happy to help you!   BACK PAIN RED FLAGS: If the back pain acutely worsens or there are any red flag symptoms such as numbness/tingling, leg weakness, saddle anesthesia, or loss of bowel/bladder control, go immediately to the ER. Follow up with [place] as scheduled or sooner if the pain does not begin to resolve or if it worsens before the follow up       ED Prescriptions     Medication Sig Dispense Auth. Provider   baclofen (LIORESAL) 10 MG tablet Take 1 tablet (10 mg total) by mouth 3 (three) times daily as needed for muscle spasms. 30 each Danton Clap, PA-C      I have reviewed the PDMP during this encounter.   Danton Clap, PA-C 04/14/22 1821

## 2022-04-15 ENCOUNTER — Ambulatory Visit
Admission: EM | Admit: 2022-04-15 | Discharge: 2022-04-15 | Disposition: A | Discharge status: Home / Self Care | Payer: Medicare HMO | Attending: Family Medicine | Admitting: Family Medicine

## 2022-04-15 ENCOUNTER — Encounter: Payer: Self-pay | Admitting: Emergency Medicine

## 2022-04-15 DIAGNOSIS — M545 Low back pain, unspecified: Secondary | ICD-10-CM | POA: Diagnosis not present

## 2022-04-15 DIAGNOSIS — S0990XA Unspecified injury of head, initial encounter: Secondary | ICD-10-CM

## 2022-04-15 DIAGNOSIS — W19XXXA Unspecified fall, initial encounter: Secondary | ICD-10-CM | POA: Diagnosis not present

## 2022-04-15 MED ORDER — TRAMADOL HCL 50 MG PO TABS: 50.0000 mg | ORAL_TABLET | Freq: Two times a day (BID) | ORAL | 0 refills | Status: DC | PRN

## 2022-04-15 NOTE — Discharge Instructions (Signed)
If anything changes - Headache, nausea, vomiting, worsening pain, etc. Call 911.  Rest.  Use the medication only if needed.

## 2022-04-15 NOTE — ED Provider Notes (Signed)
[facility]    CSN: [id number] Arrival date & time: 04/15/22  1046      History   Chief Complaint Chief Complaint  Patient presents with   Fall   Wrist Pain    HPI 82 year old male presents for evaluation of the above.  Patient was recently seen yesterday for left-sided low back pain.  This was thought to be MSK in nature and he was given baclofen.  Patient came in today to discuss this medication and get something else on his way into the building he fell.  He was helped up by myself as well as Presenter, broadcasting.  When he fell he bumped his head and has a small area of redness to the right side of his forehead.  He denies any pain at this time.  No headache.  No neck pain.  He reports some stiffness of his right wrist.  There is no reported swelling or significant pain of the right wrist.  Patient states that the baclofen that he was prescribed did not work for him.  He would like an alternative treatment.  He is not having any back pain at this time.  Patient is fully independent.  He lives with a roommate.  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   Sleep disturbance 11/04/2013   Pure hypercholesterolemia 05/22/2013   Benign essential hypertension 05/08/2013   Hyperlipidemia 05/08/2013   Shortness of breath 05/08/2013   Permanent atrial fibrillation ([place]) 05/06/2013   CVA (cerebral vascular accident) ([place]) 05/06/2013   Benign localized prostatic hyperplasia with lower urinary tract symptoms (LUTS) 03/06/2012   Calculus in bladder 03/06/2012   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   Elevated prostate specific antigen (PSA) 03/06/2012   Incomplete emptying  of bladder 03/06/2012   Kidney stone 03/06/2012   Microscopic hematuria 03/06/2012   Sleep apnea in adult 08/27/2003   Enlarged prostate 08/26/1973   Irregular heart beat 08/26/1973    Past Surgical History:  Procedure Laterality Date   COLONOSCOPY WITH PROPOFOL     CYSTOSCOPY WITH LITHOLAPAXY N/A 11/01/2018   Procedure: CYSTOSCOPY WITH LITHOLAPAXY;  Surgeon: Billey Co, MD;  Location: [facility];  Service: Urology;  Laterality: N/A;   HERNIA REPAIR     HOLEP-LASER ENUCLEATION OF THE PROSTATE WITH MORCELLATION N/A 11/01/2018   Procedure: HOLEP-LASER ENUCLEATION OF THE PROSTATE WITH MORCELLATION;  Surgeon: Billey Co, MD;  Location: [facility];  Service: Urology;  Laterality: N/A;   TONSILLECTOMY     VARICOSE VEIN SURGERY         Home Medications    Prior to Admission medications   Medication Sig Start Date End Date Taking? Authorizing Provider  traMADol (ULTRAM) 50 MG tablet Take 1 tablet (50 mg total) by mouth every 12 (twelve) hours as needed for moderate pain or severe pain. 04/15/22  Yes Chari Parmenter G, DO  diltiazem (CARDIZEM CD) 120 MG 24 hr capsule Take 1 capsule (120 mg) by mouth once daily at night 02/24/22   Minna Merritts, MD  hydroxypropyl methylcellulose / hypromellose (ISOPTO TEARS / GONIOVISC) 2.5 % ophthalmic solution Place 1 drop into both eyes 2 (two) times daily as needed for dry eyes.    [provider]  metoprolol succinate (TOPROL-XL) 50 MG 24 hr tablet Take 1 tablet (50 mg total) by mouth daily. 02/24/22 02/19/23  Minna Merritts, MD  mirabegron ER (MYRBETRIQ) 50 MG TB24 tablet Take 1 tablet (50 mg total) by mouth daily. 03/30/22   Zara Council A, PA-C  VITAMIN D, ERGOCALCIFEROL, PO Take 1,000 Int'l Units by mouth daily.    [provider]  XARELTO 20 MG TABS tablet TAKE 1 TABLET EVERY DAY WITH SUPPER 07/22/21   Minna Merritts, MD    Family History Family History  Problem Relation Age of Onset   Heart attack Father     Hypertension Father    Hyperlipidemia Father     Social History Social History   Tobacco Use   Smoking status: Never   Smokeless tobacco: Never  Vaping Use   Vaping Use: Never used  Substance Use Topics   Alcohol use: Yes    Alcohol/week: 1.0 standard drink of alcohol    Types: 1 Glasses of wine per week    Comment: social    Drug use: No     Allergies   Other   Review of Systems Review of Systems Per HPI  Physical Exam Triage Vital Signs ED Triage Vitals  Enc Vitals Group     BP 04/15/22 1103 137/79     Pulse Rate 04/15/22 1103 94     Resp 04/15/22 1103 16     Temp 04/15/22 1103 98.9 F (37.2 C)     Temp Source 04/15/22 1103 Oral     SpO2 04/15/22 1103 98 %     Weight 04/15/22 1102 194 lb 0.1 oz (88 kg)     Height 04/15/22 1102 6\' 1"  (1.854 m)     Head Circumference --      Peak Flow --      Pain Score 04/15/22 1102 7     Pain Loc --      Pain Edu? --      Excl. in [place]? --    No data found.  Updated Vital Signs BP 137/79 (BP Location: Left Arm)   Pulse 94   Temp 98.9 F (37.2 C) (Oral)   Resp 16   Ht 6\' 1"  (1.854 m)   Wt 88 kg   SpO2 98%   BMI 25.60 kg/m   Visual Acuity Right Eye Distance:   Left Eye Distance:   Bilateral Distance:    Right Eye Near:   Left Eye Near:    Bilateral Near:     Physical Exam Vitals and nursing note reviewed.  Constitutional:      General: He is not in acute distress.    Appearance: He is not ill-appearing.  HENT:     Head:      Comments: Patient has an area of erythema at the level location.  There is no appreciable or palpable hematoma. Eyes:     General:        Right eye: No discharge.        Left eye: No discharge.     Conjunctiva/sclera: Conjunctivae normal.     Pupils: Pupils are equal, round, and reactive to light.  Cardiovascular:     Comments: Irregularly irregular. Pulmonary:     Effort: Pulmonary effort is normal.     Breath sounds: Normal breath sounds. No wheezing, rhonchi or rales.   Musculoskeletal:     Comments: No tenderness of the right wrist.  Full range of motion.  No swelling.  No bruising.  Patient  has no tenderness of the paraspinal musculature of the lumbar spine.  No tenderness over the lumbar spine.  Neurological:     General: No focal deficit present.     Mental Status: He is alert and oriented to person, place, and time.     Comments: No focal neurological deficits.  Psychiatric:        Mood and Affect: Mood normal.        Behavior: Behavior normal.      UC Treatments / Results  Labs (all labs ordered are listed, but only abnormal results are displayed) Labs Reviewed - No data to display  EKG   Radiology No results found.  Procedures Procedures (including critical care time)  Medications Ordered in UC Medications - No data to display  Initial Impression / Assessment and Plan / UC Course  I have reviewed the triage vital signs and the nursing notes.  Pertinent labs & imaging results that were available during my care of the patient were reviewed by me and considered in my medical decision making (see chart for details).    82 year old male presents for evaluation of the above.  Patient had a minor fall here today.  His neurological exam is normal.  There is no hematoma on exam.  We discussed going to the hospital for imaging given the fact that he is on Xarelto.  We also discussed supportive care at home given the fact that his neurological exam is normal.  Patient wants to go home.  I have spoken with a friend of his that is listed in his chart to contact.  She will call and check on him later today.  We are stopping baclofen.  Tramadol as needed for pain.  I advised him to only take it if he needs it.  I reiterated to the patient that if he worsens in any way he needs to go directly to the hospital.  I also relayed this information to his contact that was listed in his chart.  Patient is in agreement with the plan.  Final Clinical  Impressions(s) / UC Diagnoses   Final diagnoses:  Fall, initial encounter  Acute left-sided low back pain without sciatica  Minor head injury, initial encounter     Discharge Instructions      If anything changes - Headache, nausea, vomiting, worsening pain, etc. Call 911.  Rest.  Use the medication only if needed.    ED Prescriptions     Medication Sig Dispense Auth. Provider   traMADol (ULTRAM) 50 MG tablet Take 1 tablet (50 mg total) by mouth every 12 (twelve) hours as needed for moderate pain or severe pain. 10 tablet Thersa Salt G, DO      I have reviewed the PDMP during this encounter.   Coral Spikes, [place] 04/15/22 1257

## 2022-04-15 NOTE — ED Triage Notes (Signed)
Patient was seen here yesterday for back and muscle spasms.  Patient was given a prescription for Baclofen.  As patient was entering [name] through the sliding entrance dorrs he fell and landed on his right side.  I came over and asissted patient.  Patient was alert and oriented.  Patient states that he did hit the top right side of his head on the wall in the waiting room.  Patient only reports right wrist pain at this time.  Patient states that he was returning to [name] because the medicine "baclofen was not for his back pain:.  Patient states that it made his pain worse.  Patient denies any back pain or head pain at this time. Patient is on a blood thinner.  Patient was helped off the flood with Dr. Lacinda Axon and placed in a wheelchair to be checked in and evaluated by Dr. Lacinda Axon.

## 2022-04-17 ENCOUNTER — Encounter: Payer: Self-pay | Admitting: Emergency Medicine

## 2022-04-17 ENCOUNTER — Ambulatory Visit: Admission: EM | Admit: 2022-04-17 | Discharge: 2022-04-17 | Payer: Medicare HMO

## 2022-04-17 DIAGNOSIS — M79604 Pain in right leg: Secondary | ICD-10-CM | POA: Diagnosis not present

## 2022-04-17 DIAGNOSIS — M545 Low back pain, unspecified: Secondary | ICD-10-CM

## 2022-04-17 DIAGNOSIS — M79605 Pain in left leg: Secondary | ICD-10-CM | POA: Diagnosis not present

## 2022-04-17 DIAGNOSIS — R32 Unspecified urinary incontinence: Secondary | ICD-10-CM | POA: Diagnosis not present

## 2022-04-17 NOTE — ED Triage Notes (Signed)
Pt was seen 3/29 & 3/30 for leg pain. Pt states the medication prescribed is not helping. He has pain when he stands and after sitting for awhile it makes it difficult to walk. His pain today is around the thigh are of bilateral legs.

## 2022-04-17 NOTE — ED Notes (Signed)
Patient is being discharged from the Urgent Care and sent to the Emergency Department via personal vehicle . Per Margarette Canada, NP, patient is in need of higher level of care due to severe leg pain and urinary incontinence. Patient is aware and verbalizes understanding of plan of care.  Vitals:   04/17/22 1005  BP: 113/75  Pulse: 71  Resp: 16  Temp: 98.3 F (36.8 C)  SpO2: 99%

## 2022-04-17 NOTE — ED Provider Notes (Signed)
[facility]    CSN: [id number] Arrival date & time: 04/17/22  [id number]      History   Chief Complaint Chief Complaint  Patient presents with   Leg Pain    HPI Gary Dawson is a 82 y.o. male.   HPI  82 year old male with a past medical history significant for CVA, dysrhythmia, hypertension, MVP, hyperlipidemia, sleep apnea, and dysrhythmia presents for evaluation of ongoing leg pain.  He was seen on 3/29 and 3/30 for leg pain.  He was prescribed baclofen on 3/29 and was prescribed tramadol on 3/30.  He reports that he is not getting any relief with these medications.  He has pain when he stands and after sitting for long period making it difficult to walk.  Currently he is having pain in the thigh area of both legs.  Past Medical History:  Diagnosis Date   Atypical chest pain    a. 03/2012 St echo: nl EF, no wma's.   CVA (cerebral vascular accident)    Depression    Dysrhythmia    Elevated PSA    Essential hypertension    Hyperlipidemia    MVP (mitral valve prolapse)    Persistent atrial fibrillation    a. CHA2DS2VASc = 4-->chronic Xarelto.   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   Sleep disturbance 11/04/2013   Pure hypercholesterolemia 05/22/2013   Benign essential hypertension 05/08/2013   Hyperlipidemia 05/08/2013   Shortness of breath 05/08/2013   Permanent atrial fibrillation 05/06/2013   CVA (cerebral vascular accident) 05/06/2013   Benign localized prostatic hyperplasia with lower urinary tract symptoms (LUTS) 03/06/2012   Calculus in bladder  03/06/2012   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   Elevated prostate specific antigen (PSA) 03/06/2012   Incomplete emptying of bladder 03/06/2012   Kidney stone 03/06/2012   Microscopic hematuria 03/06/2012   Sleep apnea in adult 08/27/2003   Enlarged prostate 08/26/1973   Irregular heart beat 08/26/1973    Past Surgical History:  Procedure Laterality Date   COLONOSCOPY WITH PROPOFOL     CYSTOSCOPY WITH LITHOLAPAXY N/A 11/01/2018   Procedure: CYSTOSCOPY WITH LITHOLAPAXY;  Surgeon: Billey Co, MD;  Location: [facility];  Service: Urology;  Laterality: N/A;   HERNIA REPAIR     HOLEP-LASER ENUCLEATION OF THE PROSTATE WITH MORCELLATION N/A 11/01/2018   Procedure: HOLEP-LASER ENUCLEATION OF THE PROSTATE WITH MORCELLATION;  Surgeon: Billey Co, MD;  Location: [facility] OR[facility];  Service: Urology;  Laterality: N/A;   TONSILLECTOMY     VARICOSE VEIN SURGERY         Home Medications    Prior to Admission medications   Medication Sig Start Date End Date Taking? Authorizing Provider  diltiazem (CARDIZEM CD) 120 MG 24 hr capsule Take 1 capsule (120 mg) by mouth once daily at night 02/24/22   Minna Merritts, MD  hydroxypropyl methylcellulose / hypromellose (ISOPTO TEARS / GONIOVISC) 2.5 % ophthalmic solution Place 1 drop into both eyes 2 (two) times daily as needed for dry eyes.    [provider]  metoprolol succinate (TOPROL-XL) 50 MG 24 hr  tablet Take 1 tablet (50 mg total) by mouth daily. 02/24/22 02/19/23  Minna Merritts, MD  mirabegron ER (MYRBETRIQ) 50 MG TB24 tablet Take 1 tablet (50 mg total) by mouth daily. 03/30/22   McGowan, Larene [place] A, PA-C  traMADol (ULTRAM) 50 MG tablet Take 1 tablet (50 mg total) by mouth every 12 (twelve) hours as needed for moderate pain or severe pain. 04/15/22   Coral Spikes, DO  VITAMIN D, ERGOCALCIFEROL, PO Take 1,000 Int'l Units by mouth daily.    [provider]  XARELTO 20 MG TABS tablet TAKE 1 TABLET EVERY DAY WITH SUPPER 07/22/21   Minna Merritts, MD    Family History Family History  Problem Relation Age of Onset   Heart attack Father    Hypertension Father    Hyperlipidemia Father     Social History Social History   Tobacco Use   Smoking status: Never   Smokeless tobacco: Never  Vaping Use   Vaping Use: Never used  Substance Use Topics   Alcohol use: Yes    Alcohol/week: 1.0 standard drink of alcohol    Types: 1 Glasses of wine per week    Comment: social    Drug use: No     Allergies   Other   Review of Systems Review of Systems  Musculoskeletal:  Positive for back pain and gait problem.  Neurological:  Positive for weakness. Negative for numbness.     Physical Exam Triage Vital Signs ED Triage Vitals  Enc Vitals Group     BP 04/17/22 1005 113/75     Pulse Rate 04/17/22 1005 71     Resp 04/17/22 1005 16     Temp 04/17/22 1005 98.3 F (36.8 C)     Temp Source 04/17/22 1005 Oral     SpO2 04/17/22 1005 99 %     Weight --      Height --      Head Circumference --      Peak Flow --      Pain Score 04/17/22 1004 8     Pain Loc --      Pain Edu? --      Excl. in [place]? --    No data found.  Updated Vital Signs BP 113/75 (BP Location: Left Arm)   Pulse 71   Temp 98.3 F (36.8 C) (Oral)   Resp 16   SpO2 99%   Visual Acuity Right Eye Distance:   Left Eye Distance:   Bilateral Distance:    Right Eye Near:   Left Eye Near:    Bilateral Near:     Physical Exam Vitals and nursing note reviewed.  Constitutional:      Appearance: Normal appearance. He is not ill-appearing.  Musculoskeletal:        General: Tenderness present.  Skin:    General: Skin is warm and dry.     Capillary Refill: Capillary refill takes less than 2 seconds.  Neurological:     Mental Status: He is alert.      UC Treatments / Results  Labs (all labs ordered are listed, but only abnormal results are displayed) Labs Reviewed - No data to display  EKG   Radiology No results  found.  Procedures Procedures (including critical care time)  Medications Ordered in UC Medications - No data to display  Initial Impression / Assessment and Plan / UC Course  I have reviewed the triage vital signs and the nursing notes.  Pertinent labs &  imaging results that were available during my care of the patient were reviewed by me and considered in my medical decision making (see chart for details).   Patient is a pleasant 82 year old male who presents for evaluation of pain in both of his legs as outlined in HPI above.  All question the patient he states that he has been experiencing increasing weakness in both of his lower extremities for months along with loss of bladder control but not bowel.  He was evaluated twice in the past week here and given tramadol and baclofen without any improvement of his symptoms.  He did suffer a fall on his visit on 04/15/2022 and he states it is because he tripped over his feet.  He endorses that he has had an increase in unsteady and shuffling gait and sometimes he thinks he drags one of his feet.  He is currently sitting in a wheelchair which makes the full assessment difficult.  He is moving both of his lower extremities.  He does have some mild tenderness to the outside of both legs.  Given the increased leg weakness, shuffling gait, and bowel incontinence I am concerned that his pain is coming from his spine and I have advised him that he needs to be evaluated in the emergency department.  He is a patient of [facility] so he has elected to go to [facility].  I do feel he is safe to travel via POV and he has a driver.   Final Clinical Impressions(s) / UC Diagnoses   Final diagnoses:  Pain in both lower extremities  Urinary incontinence, unspecified type  Low back pain, unspecified back pain laterality, unspecified chronicity, unspecified whether sciatica present     Discharge Instructions      Please go to [facility] to be evaluated for your leg  pain, back pain, gait instability, and urinary incontinence.     ED Prescriptions   None    PDMP not reviewed this encounter.   Margarette Canada, NP 04/17/22 1031

## 2022-04-17 NOTE — Discharge Instructions (Addendum)
Please go to [facility] to be evaluated for your leg pain, back pain, gait instability, and urinary incontinence.

## 2022-04-18 ENCOUNTER — Inpatient Hospital Stay: Payer: Medicare HMO

## 2022-04-18 ENCOUNTER — Ambulatory Visit: Admission: EM | Admit: 2022-04-18 | Discharge: 2022-04-18 | Payer: Medicare HMO

## 2022-04-18 ENCOUNTER — Emergency Department: Payer: Medicare HMO

## 2022-04-18 ENCOUNTER — Other Ambulatory Visit: Payer: Self-pay

## 2022-04-18 ENCOUNTER — Inpatient Hospital Stay
Admission: EM | Admit: 2022-04-18 | Discharge: 2022-04-26 | DRG: 286 | Disposition: A | Discharge status: Skilled Nursing Facility | Payer: Medicare HMO | Attending: Internal Medicine | Admitting: Internal Medicine

## 2022-04-18 DIAGNOSIS — Z7901 Long term (current) use of anticoagulants: Secondary | ICD-10-CM | POA: Diagnosis not present

## 2022-04-18 DIAGNOSIS — I7143 Infrarenal abdominal aortic aneurysm, without rupture: Secondary | ICD-10-CM | POA: Diagnosis present

## 2022-04-18 DIAGNOSIS — I5023 Acute on chronic systolic (congestive) heart failure: Secondary | ICD-10-CM | POA: Diagnosis present

## 2022-04-18 DIAGNOSIS — Z751 Person awaiting admission to adequate facility elsewhere: Secondary | ICD-10-CM

## 2022-04-18 DIAGNOSIS — I110 Hypertensive heart disease with heart failure: Secondary | ICD-10-CM | POA: Diagnosis not present

## 2022-04-18 DIAGNOSIS — R296 Repeated falls: Secondary | ICD-10-CM | POA: Diagnosis present

## 2022-04-18 DIAGNOSIS — Z8673 Personal history of transient ischemic attack (TIA), and cerebral infarction without residual deficits: Secondary | ICD-10-CM | POA: Diagnosis not present

## 2022-04-18 DIAGNOSIS — Z043 Encounter for examination and observation following other accident: Secondary | ICD-10-CM | POA: Diagnosis not present

## 2022-04-18 DIAGNOSIS — E7800 Pure hypercholesterolemia, unspecified: Secondary | ICD-10-CM | POA: Diagnosis not present

## 2022-04-18 DIAGNOSIS — L409 Psoriasis, unspecified: Secondary | ICD-10-CM | POA: Diagnosis present

## 2022-04-18 DIAGNOSIS — Z743 Need for continuous supervision: Secondary | ICD-10-CM | POA: Diagnosis not present

## 2022-04-18 DIAGNOSIS — S199XXA Unspecified injury of neck, initial encounter: Secondary | ICD-10-CM | POA: Diagnosis not present

## 2022-04-18 DIAGNOSIS — I10 Essential (primary) hypertension: Secondary | ICD-10-CM | POA: Diagnosis not present

## 2022-04-18 DIAGNOSIS — G9389 Other specified disorders of brain: Secondary | ICD-10-CM | POA: Diagnosis not present

## 2022-04-18 DIAGNOSIS — I714 Abdominal aortic aneurysm, without rupture, unspecified: Secondary | ICD-10-CM | POA: Diagnosis not present

## 2022-04-18 DIAGNOSIS — S0990XA Unspecified injury of head, initial encounter: Secondary | ICD-10-CM | POA: Diagnosis not present

## 2022-04-18 DIAGNOSIS — G4733 Obstructive sleep apnea (adult) (pediatric): Secondary | ICD-10-CM | POA: Diagnosis present

## 2022-04-18 DIAGNOSIS — Z8249 Family history of ischemic heart disease and other diseases of the circulatory system: Secondary | ICD-10-CM | POA: Diagnosis not present

## 2022-04-18 DIAGNOSIS — I252 Old myocardial infarction: Secondary | ICD-10-CM | POA: Diagnosis not present

## 2022-04-18 DIAGNOSIS — I4891 Unspecified atrial fibrillation: Secondary | ICD-10-CM | POA: Diagnosis not present

## 2022-04-18 DIAGNOSIS — Z79899 Other long term (current) drug therapy: Secondary | ICD-10-CM

## 2022-04-18 DIAGNOSIS — K219 Gastro-esophageal reflux disease without esophagitis: Secondary | ICD-10-CM | POA: Diagnosis not present

## 2022-04-18 DIAGNOSIS — F32A Depression, unspecified: Secondary | ICD-10-CM | POA: Diagnosis present

## 2022-04-18 DIAGNOSIS — R29898 Other symptoms and signs involving the musculoskeletal system: Secondary | ICD-10-CM

## 2022-04-18 DIAGNOSIS — R69 Illness, unspecified: Secondary | ICD-10-CM | POA: Diagnosis not present

## 2022-04-18 DIAGNOSIS — Z9181 History of falling: Secondary | ICD-10-CM

## 2022-04-18 DIAGNOSIS — R000 Tachycardia, unspecified: Secondary | ICD-10-CM | POA: Diagnosis not present

## 2022-04-18 DIAGNOSIS — R531 Weakness: Secondary | ICD-10-CM

## 2022-04-18 DIAGNOSIS — I428 Other cardiomyopathies: Secondary | ICD-10-CM

## 2022-04-18 DIAGNOSIS — F0390 Unspecified dementia without behavioral disturbance: Secondary | ICD-10-CM | POA: Diagnosis present

## 2022-04-18 DIAGNOSIS — R32 Unspecified urinary incontinence: Secondary | ICD-10-CM

## 2022-04-18 DIAGNOSIS — I5020 Unspecified systolic (congestive) heart failure: Secondary | ICD-10-CM | POA: Diagnosis not present

## 2022-04-18 DIAGNOSIS — E538 Deficiency of other specified B group vitamins: Secondary | ICD-10-CM | POA: Diagnosis present

## 2022-04-18 DIAGNOSIS — I429 Cardiomyopathy, unspecified: Secondary | ICD-10-CM

## 2022-04-18 DIAGNOSIS — R911 Solitary pulmonary nodule: Secondary | ICD-10-CM | POA: Diagnosis present

## 2022-04-18 DIAGNOSIS — E785 Hyperlipidemia, unspecified: Secondary | ICD-10-CM | POA: Diagnosis present

## 2022-04-18 DIAGNOSIS — I272 Pulmonary hypertension, unspecified: Secondary | ICD-10-CM | POA: Diagnosis not present

## 2022-04-18 DIAGNOSIS — R410 Disorientation, unspecified: Secondary | ICD-10-CM

## 2022-04-18 DIAGNOSIS — R109 Unspecified abdominal pain: Secondary | ICD-10-CM | POA: Diagnosis not present

## 2022-04-18 DIAGNOSIS — Z01810 Encounter for preprocedural cardiovascular examination: Secondary | ICD-10-CM | POA: Diagnosis not present

## 2022-04-18 DIAGNOSIS — I4821 Permanent atrial fibrillation: Principal | ICD-10-CM | POA: Diagnosis present

## 2022-04-18 DIAGNOSIS — I639 Cerebral infarction, unspecified: Secondary | ICD-10-CM | POA: Diagnosis present

## 2022-04-18 DIAGNOSIS — Z83438 Family history of other disorder of lipoprotein metabolism and other lipidemia: Secondary | ICD-10-CM

## 2022-04-18 DIAGNOSIS — I2510 Atherosclerotic heart disease of native coronary artery without angina pectoris: Secondary | ICD-10-CM | POA: Diagnosis present

## 2022-04-18 DIAGNOSIS — R011 Cardiac murmur, unspecified: Secondary | ICD-10-CM | POA: Diagnosis present

## 2022-04-18 LAB — COMPREHENSIVE METABOLIC PANEL
ALT: 34 U/L (ref 0–44)
AST: 63 U/L — ABNORMAL HIGH (ref 15–41)
Albumin: 3.8 g/dL (ref 3.5–5.0)
Alkaline Phosphatase: 56 U/L (ref 38–126)
Anion gap: 10 (ref 5–15)
BUN: 27 mg/dL — ABNORMAL HIGH (ref 8–23)
CO2: 25 mmol/L (ref 22–32)
Calcium: 8.9 mg/dL (ref 8.9–10.3)
Chloride: 102 mmol/L (ref 98–111)
Creatinine, Ser: 0.78 mg/dL (ref 0.61–1.24)
GFR, Estimated: >60 mL/min (ref >60)
Glucose, Bld: 112 mg/dL — ABNORMAL HIGH (ref 70–99)
Potassium: 4.1 mmol/L (ref 3.5–5.1)
Sodium: 137 mmol/L (ref 135–145)
Total Bilirubin: 1.8 mg/dL — ABNORMAL HIGH (ref 0.3–1.2)
Total Protein: 7.8 g/dL (ref 6.5–8.1)

## 2022-04-18 LAB — LACTIC ACID, PLASMA
Lactic Acid, Venous: 1.7 mmol/L (ref 0.5–1.9)
Lactic Acid, Venous: 1.3 mmol/L (ref 0.5–1.9)

## 2022-04-18 LAB — CBC WITH DIFFERENTIAL/PLATELET
Abs Immature Granulocytes: 0.03 10*3/uL (ref 0.00–0.07)
Basophils Absolute: 0 10*3/uL (ref 0.0–0.1)
Basophils Relative: 0 %
Eosinophils Absolute: 0 10*3/uL (ref 0.0–0.5)
Eosinophils Relative: 0 %
HCT: 37.4 % — ABNORMAL LOW (ref 39.0–52.0)
Hemoglobin: 12.6 g/dL — ABNORMAL LOW (ref 13.0–17.0)
Immature Granulocytes: 0 %
Lymphocytes Relative: 9 %
Lymphs Abs: 0.8 10*3/uL (ref 0.7–4.0)
MCH: 33.8 pg (ref 26.0–34.0)
MCHC: 33.7 g/dL (ref 30.0–36.0)
MCV: 100.3 fL — ABNORMAL HIGH (ref 80.0–100.0)
Monocytes Absolute: 1.2 10*3/uL — ABNORMAL HIGH (ref 0.1–1.0)
Monocytes Relative: 14 %
Neutro Abs: 6.7 10*3/uL (ref 1.7–7.7)
Neutrophils Relative %: 77 %
Platelets: 166 10*3/uL (ref 150–400)
RBC: 3.73 MIL/uL — ABNORMAL LOW (ref 4.22–5.81)
RDW: 13.2 % (ref 11.5–15.5)
WBC: 8.7 10*3/uL (ref 4.0–10.5)
nRBC: 0 % (ref 0.0–0.2)

## 2022-04-18 LAB — URINALYSIS, ROUTINE W REFLEX MICROSCOPIC
Bacteria, UA: NONE SEEN
Bilirubin Urine: NEGATIVE
Glucose, UA: NEGATIVE mg/dL
Hgb urine dipstick: NEGATIVE
Ketones, ur: NEGATIVE mg/dL
Leukocytes,Ua: NEGATIVE
Nitrite: NEGATIVE
Protein, ur: 100 mg/dL — AB
Specific Gravity, Urine: 1.024 (ref 1.005–1.030)
pH: 5 (ref 5.0–8.0)

## 2022-04-18 LAB — VITAMIN B12: Vitamin B-12: 2546 pg/mL — ABNORMAL HIGH (ref 180–914)

## 2022-04-18 LAB — TROPONIN I (HIGH SENSITIVITY): Troponin I (High Sensitivity): 19 ng/L — ABNORMAL HIGH (ref <18)

## 2022-04-18 MED ORDER — ONDANSETRON HCL 4 MG PO TABS: 4.0000 mg | ORAL_TABLET | Freq: Four times a day (QID) | ORAL | Status: AC | PRN

## 2022-04-18 MED ORDER — POLYVINYL ALCOHOL 1.4 % OP SOLN
1.0000 [drp] | Freq: Two times a day (BID) | OPHTHALMIC | Status: DC | PRN
  Filled 2022-04-18: qty 15

## 2022-04-18 MED ORDER — DILTIAZEM LOAD VIA INFUSION
10.0000 mg | Freq: Once | INTRAVENOUS | Status: AC
  Administered 2022-04-18: 10 mg via INTRAVENOUS
  Filled 2022-04-18: qty 10

## 2022-04-18 MED ORDER — RIVAROXABAN 20 MG PO TABS
20.0000 mg | ORAL_TABLET | Freq: Every day | ORAL | Status: DC
  Administered 2022-04-19: 20 mg via ORAL
  Filled 2022-04-18 (×2): qty 1

## 2022-04-18 MED ORDER — TRAMADOL HCL 50 MG PO TABS
50.0000 mg | ORAL_TABLET | Freq: Two times a day (BID) | ORAL | Status: DC | PRN
  Administered 2022-04-21 – 2022-04-26 (×2): 50 mg via ORAL
  Filled 2022-04-18 (×2): qty 1

## 2022-04-18 MED ORDER — DILTIAZEM HCL-DEXTROSE 125-5 MG/125ML-% IV SOLN (PREMIX)
5.0000 mg/h | INTRAVENOUS | Status: DC
  Administered 2022-04-18: 5 mg/h via INTRAVENOUS
  Filled 2022-04-18: qty 125

## 2022-04-18 MED ORDER — DILTIAZEM HCL ER COATED BEADS 120 MG PO CP24
120.0000 mg | ORAL_CAPSULE | Freq: Every day | ORAL | Status: DC
  Administered 2022-04-19: 120 mg via ORAL
  Filled 2022-04-18 (×2): qty 1

## 2022-04-18 MED ORDER — ONDANSETRON HCL 4 MG/2ML IJ SOLN: 4.0000 mg | Freq: Four times a day (QID) | INTRAMUSCULAR | Status: AC | PRN

## 2022-04-18 MED ORDER — METOPROLOL SUCCINATE ER 50 MG PO TB24
50.0000 mg | ORAL_TABLET | Freq: Every day | ORAL | Status: DC
  Administered 2022-04-18: 50 mg via ORAL
  Filled 2022-04-18 (×2): qty 1

## 2022-04-18 MED ORDER — DILTIAZEM HCL ER COATED BEADS 120 MG PO CP24
120.0000 mg | ORAL_CAPSULE | Freq: Every day | ORAL | Status: DC
  Filled 2022-04-18: qty 1

## 2022-04-18 MED ORDER — SENNOSIDES-DOCUSATE SODIUM 8.6-50 MG PO TABS: 1.0000 | ORAL_TABLET | Freq: Every evening | ORAL | Status: DC | PRN

## 2022-04-18 MED ORDER — DILTIAZEM HCL 25 MG/5ML IV SOLN
10.0000 mg | Freq: Once | INTRAVENOUS | Status: AC
  Administered 2022-04-18: 10 mg via INTRAVENOUS
  Filled 2022-04-18: qty 5

## 2022-04-18 MED ORDER — LIDOCAINE 5 % EX PTCH
2.0000 | MEDICATED_PATCH | CUTANEOUS | Status: DC
  Administered 2022-04-18 – 2022-04-24 (×8): 2 via TRANSDERMAL
  Filled 2022-04-18 (×10): qty 2

## 2022-04-18 MED ORDER — DILTIAZEM HCL ER COATED BEADS 120 MG PO CP24: 120.0000 mg | ORAL_CAPSULE | Freq: Every day | ORAL | Status: DC

## 2022-04-18 MED ORDER — ACETAMINOPHEN 325 MG PO TABS
650.0000 mg | ORAL_TABLET | Freq: Four times a day (QID) | ORAL | Status: AC | PRN
  Administered 2022-04-20 – 2022-04-21 (×2): 650 mg via ORAL
  Filled 2022-04-18 (×2): qty 2

## 2022-04-18 MED ORDER — SODIUM CHLORIDE 0.9 % IV BOLUS
1000.0000 mL | Freq: Once | INTRAVENOUS | Status: AC
  Administered 2022-04-18: 1000 mL via INTRAVENOUS

## 2022-04-18 MED ORDER — ACETAMINOPHEN 650 MG RE SUPP: 650.0000 mg | Freq: Four times a day (QID) | RECTAL | Status: AC | PRN

## 2022-04-18 MED ORDER — ENOXAPARIN SODIUM 40 MG/0.4ML IJ SOSY
40.0000 mg | PREFILLED_SYRINGE | INTRAMUSCULAR | Status: DC
  Administered 2022-04-18: 40 mg via SUBCUTANEOUS
  Filled 2022-04-18: qty 0.4

## 2022-04-18 MED ORDER — IOHEXOL 300 MG/ML  SOLN
100.0000 mL | Freq: Once | INTRAMUSCULAR | Status: AC | PRN
  Administered 2022-04-18: 100 mL via INTRAVENOUS

## 2022-04-18 NOTE — ED Notes (Signed)
Xarelto and oral diltiazem requested from pharmacy as patient dropped on floor.

## 2022-04-18 NOTE — Assessment & Plan Note (Addendum)
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 Lucky Cowboy has been consulted and states the patient will be seen Vascular services was resting cardiology to be consulted for cardiology clearance Plan to fix AAA in the next few weeks Cardiology has been consulted to [facility] via delayed staff message for 04/19/2022 and routine epic order

## 2022-04-18 NOTE — Assessment & Plan Note (Addendum)
TSH level checked on 02/08/2022 and the value was 1.50 UA done was negative for leukocytes and nitrites and bacteria No clinical signs for infectious etiology at this time And s/p Cardizem infusion which has been no switched with metoprolol. Echocardiogram with worsening of EF so home Cardizem was discontinued. Home Xarelto is being converted with heparin for cardiac cath either on Thursday afternoon or Friday morning. Cardiology is on board-appreciate their help

## 2022-04-18 NOTE — ED Notes (Signed)
Patient ambulated with cane and standby assist to toilet. Ambulated back to bed. Positioned for comfort. No distress noted.

## 2022-04-18 NOTE — H&P (Signed)
History and Physical   Gary Dawson [id number] DOB: 1940/02/06 DOA: 04/18/2022  PCP: Baxter Hire, MD  Outpatient Specialists: Dr. Rockey Situ, [facility] cardiology Patient coming from: Urgent care center via EMS  I have personally briefly reviewed patient's old medical records in [place].  Chief Concern: Deconditioning/debility, weakness  HPI: [name] is a 82 year old male with history of permanent atrial fibrillation on Xarelto, cardiomyopathy, OSA on CPAP, history of hernia repair, history of tonsillectomy, who presents to the emergency department from urgent care center for chief concerns frequent falls and was sent to the ED for altered mental status.  Vitals in the emergency department showed temperature of 99.7, respiration rate 29, heart rate 98, blood pressure 129/104, SpO2 of 96% on room air.  Serum sodium is 137, potassium 4.1, chloride 102, bicarb 25, BUN of 27, serum creatinine 0.78, nonfasting blood glucose 112, EGFR greater than 60, WBC 8.7, hemoglobin 12.6, platelets of 166.  High since troponin is 19.  Lactic acid is 1.7.  CT of the head without contrast and CT cervical spine without contrast: Was read as no evidence of acute intracranial abnormality.  No acute cervical spine fracture.  Mild chronic small vessel ischemic disease and unchanged chronic ventriculomegaly.  CT abdomen pelvis with contrast: Was read as infrarenal saccular abdominal aortic aneurysm measuring up to 5.1 cm in maximum dimension.  Vascular surgical consultation is recommended.  Borderline prominence of gas and stool query mild constipation.  Moderate cardiomegaly with atherosclerotic calcification of the coronary arteries.  5 x 4 mm right middle lobe pulmonary nodule, no follow-up imaging of this nodule recommended.  Aortic and coronary atherosclerosis.  2.3 x 2.0 cm exophytic hypodense lesion in the right kidney and upper pole with internal density of 14 Hounsfield units.  No  follow-up of this lesion is recommended.  CT L-spine: Was read as no acute osseous abnormality identified in the lumbar spine.  Lumbar disc and facet degeneration and moderate spinal stenosis at L3-4 and L4-5  ED treatment: Diltiazem 10 mg IV one-time dose, diltiazem gtt., sodium chloride 1 L bolus. -------------------- At bedside, patient is able to tell me his name, age, location, current calendar year.  He reports the main reason he came to urgent care center was because he has been having worsening weakness, generalized over the last 2 weeks.  He denies chest pain, shortness of breath, abdominal pain, dysuria, hematuria, diarrhea, blood in his stool, syncope, loss of consciousness, trauma to his person.  He denies fever, known sick contacts.  He endorses medication compliance.  Of note: At bedside he was able to flex and extend his knee and hips with 5 out of 5 strength as appropriate.  Social history: He lives in his home and there is a student/roommate renting a room with him.  He denies tobacco, EtOH, recreational drug use.  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  ROS: Constitutional: no weight change, no fever ENT/Mouth: no sore throat, no rhinorrhea Eyes: no eye pain, no vision changes Cardiovascular: no chest pain, no dyspnea,  no edema, no palpitations Respiratory: no cough, no sputum, no wheezing Gastrointestinal: no nausea, no vomiting, no diarrhea, no constipation Genitourinary: no urinary incontinence, no dysuria, no hematuria Musculoskeletal: no arthralgias, no myalgias Skin: no skin lesions, no pruritus, Neuro: + weakness, no loss of consciousness, no syncope Psych: no anxiety, no depression, + decrease appetite Heme/Lymph: no bruising, no bleeding  ED Course: Discussed with emergency  medicine provider, patient requiring hospitalization for chief concerns of A-fib with RVR.  Assessment/Plan  Principal Problem:   Atrial  fibrillation with RVR Active Problems:   Infrarenal abdominal aortic aneurysm (AAA) without rupture   Weakness   Permanent atrial fibrillation   CVA (cerebral vascular accident)   Benign essential hypertension   Hyperlipidemia   Pure hypercholesterolemia   OSA on CPAP   Assessment and Plan:  * Atrial fibrillation with RVR Etiology workup in progress, continue to monitor lactic acid TSH was considered however patient had a TSH level checked on 02/08/2022 and the value was 1.50 UA done was negative for leukocytes and nitrites and bacteria No clinical signs for infectious etiology at this time Status post diltiazem 10 mg IV one-time dose and diltiazem 10 mg IV administered over 3 minutes for load Continue diltiazem GGT Home Xarelto resumed on admission Cardiology has been consulted at the request of vascular and assistance with atrial fibrillation with RVR if indicated Complete echo ordered Admit to progressive, inpatient  Infrarenal abdominal aortic aneurysm (AAA) without rupture Infrarenal saccular abdominal aortic aneurysm measuring up to 5.1 cm in maximum dimension Vascular surgeon, Dr. Lucky Cowboy has been consulted and states the patient will be seen Vascular services was resting cardiology to be consulted for cardiology clearance Plan to fix AAA in the next few weeks Cardiology has been consulted to [facility] via delayed staff message for 04/19/2022 and routine epic order  Weakness Generalized weakness Query deconditioning however given patient's age, B12 deficiency cannot be excluded at this time Check B12 level, if low this is a reversible cause of generalized weakness and will need to be corrected PT, OT consulted  OSA on CPAP CPAP nightly ordered  Permanent atrial fibrillation Resumed home diltiazem 120 mg nightly and home metoprolol succinate 50 mg daily on admission  Chart reviewed.   DVT prophylaxis: Enoxaparin Code Status: Full code Diet: Heart healthy Family  Communication: Attempted to call patient's brother at patient's request at: [phone] twice, no pick up. I left a voicemail.  Disposition Plan: Pending clinical course Consults called: Vascular and cardiology Admission status: PCU, inpatient  Past Medical History:  Diagnosis Date   Atypical chest pain    a. 03/2012 St echo: nl EF, no wma's.   CVA (cerebral vascular accident)    Depression    Dysrhythmia    Elevated PSA    Essential hypertension    Hyperlipidemia    MVP (mitral valve prolapse)    Persistent atrial fibrillation    a. CHA2DS2VASc = 4-->chronic Xarelto.   Psoriasis    Reflux    Sleep apnea    Vitamin B 12 deficiency    Past Surgical History:  Procedure Laterality Date   COLONOSCOPY WITH PROPOFOL     CYSTOSCOPY WITH LITHOLAPAXY N/A 11/01/2018   Procedure: CYSTOSCOPY WITH LITHOLAPAXY;  Surgeon: Billey Co, MD;  Location: [facility] ORS;  Service: Urology;  Laterality: N/A;   HERNIA REPAIR     HOLEP-LASER ENUCLEATION OF THE PROSTATE WITH MORCELLATION N/A 11/01/2018   Procedure: HOLEP-LASER ENUCLEATION OF THE PROSTATE WITH MORCELLATION;  Surgeon: Billey Co, MD;  Location: [facility] ORS;  Service: Urology;  Laterality: N/A;   TONSILLECTOMY     VARICOSE VEIN SURGERY     Social History:  reports that he has never smoked. He has never used smokeless tobacco. He reports current alcohol use of about 1.0 standard drink of alcohol per week. He reports that he does not use drugs.  Allergies  Allergen Reactions  Other Other (See Comments)    Trees and grasses    Family History  Problem Relation Age of Onset   Heart attack Father    Hypertension Father    Hyperlipidemia Father    Family history: Family history reviewed and not pertinent.  Prior to Admission medications   Medication Sig Start Date End Date Taking? Authorizing Provider  diltiazem (CARDIZEM CD) 120 MG 24 hr capsule Take 1 capsule (120 mg) by mouth once daily at night 02/24/22   Minna Merritts, MD   hydroxypropyl methylcellulose / hypromellose (ISOPTO TEARS / GONIOVISC) 2.5 % ophthalmic solution Place 1 drop into both eyes 2 (two) times daily as needed for dry eyes.    [provider]  metoprolol succinate (TOPROL-XL) 50 MG 24 hr tablet Take 1 tablet (50 mg total) by mouth daily. 02/24/22 02/19/23  Minna Merritts, MD  mirabegron ER (MYRBETRIQ) 50 MG TB24 tablet Take 1 tablet (50 mg total) by mouth daily. 03/30/22   McGowan, Larene Beach A, PA-C  traMADol (ULTRAM) 50 MG tablet Take 1 tablet (50 mg total) by mouth every 12 (twelve) hours as needed for moderate pain or severe pain. 04/15/22   Coral Spikes, DO  VITAMIN D, ERGOCALCIFEROL, PO Take 1,000 Int'l Units by mouth daily.    [provider]  XARELTO 20 MG TABS tablet TAKE 1 TABLET EVERY DAY WITH SUPPER 07/22/21   Minna Merritts, MD   Physical Exam: Vitals:   04/18/22 1900 04/18/22 1935 04/18/22 2000 04/18/22 2030  BP: (!) 142/77 138/84 (!) 129/93 139/87  Pulse: 84 (!) 140 (!) 138 (!) 110  Resp: (!) 21 17 19 16   Temp:      TempSrc:      SpO2: 95% 97% 96% 95%   Constitutional: appears age-appropriate, frail, NAD, calm, comfortable Eyes: PERRL, lids and conjunctivae normal ENMT: Mucous membranes are moist. Posterior pharynx clear of any exudate or lesions. Age-appropriate dentition. Hearing appropriate Neck: normal, supple, no masses, no thyromegaly Respiratory: clear to auscultation bilaterally, no wheezing, no crackles. Normal respiratory effort. No accessory muscle use.  Cardiovascular: Regular rate and rhythm, no murmurs / rubs / gallops. No extremity edema. 2+ pedal pulses. No carotid bruits.  Abdomen: no tenderness, no masses palpated, no hepatosplenomegaly. Bowel sounds positive.  Musculoskeletal: no clubbing / cyanosis. No joint deformity upper and lower extremities. Good ROM, no contractures, no atrophy. Normal muscle tone.  Skin: no rashes, lesions, ulcers. No induration Neurologic: Sensation intact. Strength  5/5 in all 4.  Psychiatric: Normal judgment and insight. Alert and oriented x 3. Normal mood.   EKG: independently reviewed, showing atrial fibrillation with rate 138, QTc 476  Chest x-ray on Admission: I personally reviewed and I agree with radiologist reading as below.  DG Chest Port 1 View  Result Date: 04/18/2022 CLINICAL DATA:  Confusion, weakness EXAM: PORTABLE CHEST 1 VIEW COMPARISON:  09/29/2019, 04/18/2022 FINDINGS: Single frontal view of the chest demonstrates stable enlargement of the cardiac silhouette. No acute airspace disease, effusion, or pneumothorax. No acute bony abnormality. IMPRESSION: 1. Stable enlarged cardiac silhouette. 2. No acute intrathoracic process. Electronically Signed   By: Randa Ngo M.D.   On: 04/18/2022 18:24   CT ABDOMEN PELVIS W CONTRAST  Result Date: 04/18/2022 CLINICAL DATA:  Acute abdominal pain and low back pain after fall. EXAM: CT ABDOMEN AND PELVIS WITH CONTRAST TECHNIQUE: Multidetector CT imaging of the abdomen and pelvis was performed using the standard protocol following bolus administration of intravenous contrast. RADIATION DOSE REDUCTION: This  exam was performed according to the departmental dose-optimization program which includes automated exposure control, adjustment of the mA and/or kV according to patient size and/or use of iterative reconstruction technique. CONTRAST:  170mL OMNIPAQUE IOHEXOL 300 MG/ML  SOLN COMPARISON:  12/06/2009 FINDINGS: Despite efforts by the technologist and patient, motion artifact is present on today's exam and could not be eliminated. This reduces exam sensitivity and specificity. Lower chest: Moderate cardiomegaly with atherosclerotic calcification of the left anterior descending, right, and circumflex coronary arteries as well as the descending thoracic aorta. 5 by 4 mm right middle lobe pulmonary nodule on image 1 series 4, not included on the prior CT of 12/06/2009. No follow-up imaging of this nodule is  recommended. JACR 2018 Feb; 264-273, Management of the Incidental Renal Mass on CT, [employer] 2021; 814-848, Bosniak Classification of Cystic Renal Masses, Version 2019. Per Fleischner Society Guidelines, no routine follow-up imaging is recommended. These guidelines do not apply to immunocompromised patients and patients with cancer. Follow up in patients with significant comorbidities as clinically warranted. For lung cancer screening, adhere to Lung-RADS guidelines. Reference: Radiology. 2017; 284(1):228-43. Hepatobiliary: Unremarkable Pancreas: Unremarkable Spleen: Unremarkable Adrenals/Urinary Tract: Both adrenal glands appear normal. 2.3 by 2.0 cm exophytic hypodense lesion of the right kidney upper pole with internal density of 14 Hounsfield units and relatively homogeneous density characteristics. No follow-up imaging of this cyst is recommended. JACR 2018 Feb; 264-273, Management of the Incidental Renal Mass on CT, [employer] 2021; 814-848, Bosniak Classification of Cystic Renal Masses, Version 2019. Additional small hypodense renal cysts in both kidneys noted. No further imaging workup of these lesions is indicated. Stomach/Bowel: Borderline prominence of gas and stool in the colon, query mild constipation. 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 The saccular portion of the aneurysm measures 4.6 by 2.9 by 4.0 cm, with a margin of thrombosis and a centrally enhancing component. If we include the abdominal aorta itself in the overall size determination, this measures 5.1 cm in maximum dimension. I do not see evidence of acute or recent retroperitoneal hemorrhage or current aortoduodenal fistula, although the duodenum abuts this saccular aneurysm. Vascular surgical consultation is recommended. Reference: J Vasc Surg [id number]. Reproductive:  Markedly reduced size of the prostate gland compared to 12/06/2009, likely related to the patient's laser enucleation of the prostate performed on 11/01/2018. Mild residual asymmetry with the right peripheral prostate gland or protuberant than the left. Other: No supplemental non-categorized findings. Musculoskeletal: Please see dedicated spine CT reports. Moderate degenerative hip arthropathy bilaterally. IMPRESSION: 1. 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 Vascular surgical consultation is recommended. 2. Borderline prominence of gas and stool in the colon, query mild constipation. 3. Moderate cardiomegaly with atherosclerotic calcification of the coronary arteries. 4. Moderate degenerative hip arthropathy bilaterally. 5. 5 by 4 mm right middle lobe pulmonary nodule. No follow-up imaging of this nodule is recommended. 6. Aortic and coronary atherosclerosis. 7. 2.3 by 2.0 cm exophytic hypodense lesion of the right kidney upper pole with internal density of 14 Hounsfield units and relatively homogeneous density characteristics. No follow up of this lesion is recommended. Aortic Atherosclerosis (ICD10-I70.0). Electronically Signed   By: Van Clines M.D.   On: 04/18/2022 17:12   CT L-SPINE NO CHARGE  Result Date: 04/18/2022 CLINICAL DATA:  Fall. EXAM: CT LUMBAR SPINE WITHOUT CONTRAST TECHNIQUE: Multidetector CT imaging of the lumbar  spine was performed without intravenous contrast administration. Multiplanar CT image reconstructions were also generated. RADIATION DOSE REDUCTION: This exam was performed according to the departmental dose-optimization program which includes automated exposure control, adjustment of the mA and/or kV according to patient size and/or use of iterative reconstruction technique. COMPARISON:  CT abdomen and pelvis 12/06/2009 FINDINGS: Segmentation: There is transitional lumbosacral anatomy. Designating the lowest ribs is T12, the  transitional segment is a partially sacralized L5. Alignment: Mild lumbar levoscoliosis. Trace anterolisthesis of L3 on L4. Vertebrae: Mild T11 and L1 superior endplate compression fractures, chronic in appearance. Scattered Schmorl's nodes. No definite acute fracture. No destructive osseous lesion. Solid bridging anterior vertebral osteophytes at T8-9 and T9-10. Paraspinal and other soft tissues: No acute abnormality in the paraspinal soft tissues. Remainder of the abdomen and pelvis reported separately. Disc levels: Disc bulging and posterior element hypertrophy result in mild spinal stenosis at L2-3 and likely moderate spinal stenosis at L3-4 and L4-5. Mild-to-moderate multilevel neural foraminal stenosis. IMPRESSION: 1. No acute osseous abnormality identified in the lumbar spine. 2. 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 Electronically Signed   By: Logan Bores M.D.   On: 04/18/2022 17:11   CT HEAD WO CONTRAST (5MM)  Result Date: 04/18/2022 CLINICAL DATA:  Head trauma, moderate-severe; Neck trauma (Age >= 65y). EXAM: CT HEAD WITHOUT CONTRAST CT CERVICAL SPINE WITHOUT CONTRAST TECHNIQUE: Multidetector CT imaging of the head and cervical spine was performed following the standard protocol without intravenous contrast. Multiplanar CT image reconstructions of the cervical spine were also generated. RADIATION DOSE REDUCTION: This exam was performed according to the departmental dose-optimization program which includes automated exposure control, adjustment of the mA and/or kV according to patient size and/or use of iterative reconstruction technique. COMPARISON:  CT head and cervical spine 03/19/2020. FINDINGS: CT HEAD FINDINGS Brain: There is no evidence of an acute infarct, intracranial hemorrhage, mass, midline shift, or extra-axial fluid collection. Moderate ventriculomegaly is unchanged and may reflect central predominant cerebral atrophy or chronic communicating  hydrocephalus. Cerebral white matter hypodensities are unchanged and nonspecific but compatible with mild chronic small vessel ischemic disease. 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 Vascular: Calcified atherosclerosis at the skull base. No hyperdense vessel. Skull: No acute fracture or suspicious osseous lesion. Sinuses/Orbits: Tiny mucous retention cyst in the left sphenoid sinus. Clear mastoid air cells. Bilateral cataract extraction. Other: None. CT CERVICAL SPINE FINDINGS Alignment: Unchanged trace retrolisthesis of C3 on C4. Skull base and vertebrae: Unchanged mild chronic C7 superior endplate compression fracture. No acute fracture or suspicious osseous lesion. Soft tissues and spinal canal: No prevertebral fluid or swelling. No visible canal hematoma. Disc levels: Similar appearance of multilevel disc degeneration, with moderate disc space narrowing noted from C3-4 through C5-6. Disc bulging and uncovertebral spurring at these levels result in mild spinal stenosis an asymmetrically advanced right-sided neural foraminal stenosis. Upper chest: No mass or consolidation in the included lung apices. Other: None. IMPRESSION: 1. No evidence of acute intracranial abnormality. 2. Mild chronic small vessel ischemic disease and unchanged chronic ventriculomegaly. 3. No acute cervical spine fracture. Electronically Signed   By: Logan Bores M.D.   On: 04/18/2022 17:02   CT Cervical Spine Wo Contrast  Result Date: 04/18/2022 CLINICAL DATA:  Head trauma, moderate-severe; Neck trauma (Age >= 65y). EXAM: CT HEAD WITHOUT CONTRAST CT CERVICAL SPINE WITHOUT CONTRAST TECHNIQUE: Multidetector CT imaging of the head and cervical spine was performed following the standard protocol without intravenous contrast. Multiplanar CT  image reconstructions of the cervical spine were also generated. RADIATION DOSE REDUCTION: This exam was performed according to the departmental dose-optimization program which  includes automated exposure control, adjustment of the mA and/or kV according to patient size and/or use of iterative reconstruction technique. COMPARISON:  CT head and cervical spine 03/19/2020. FINDINGS: CT HEAD FINDINGS Brain: There is no evidence of an acute infarct, intracranial hemorrhage, mass, midline shift, or extra-axial fluid collection. Moderate ventriculomegaly is unchanged and may reflect central predominant cerebral atrophy or chronic communicating hydrocephalus. Cerebral white matter hypodensities are unchanged and nonspecific but compatible with mild chronic small vessel ischemic disease. 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 Vascular: Calcified atherosclerosis at the skull base. No hyperdense vessel. Skull: No acute fracture or suspicious osseous lesion. Sinuses/Orbits: Tiny mucous retention cyst in the left sphenoid sinus. Clear mastoid air cells. Bilateral cataract extraction. Other: None. CT CERVICAL SPINE FINDINGS Alignment: Unchanged trace retrolisthesis of C3 on C4. Skull base and vertebrae: Unchanged mild chronic C7 superior endplate compression fracture. No acute fracture or suspicious osseous lesion. Soft tissues and spinal canal: No prevertebral fluid or swelling. No visible canal hematoma. Disc levels: Similar appearance of multilevel disc degeneration, with moderate disc space narrowing noted from C3-4 through C5-6. Disc bulging and uncovertebral spurring at these levels result in mild spinal stenosis an asymmetrically advanced right-sided neural foraminal stenosis. Upper chest: No mass or consolidation in the included lung apices. Other: None. IMPRESSION: 1. No evidence of acute intracranial abnormality. 2. Mild chronic small vessel ischemic disease and unchanged chronic ventriculomegaly. 3. No acute cervical spine fracture. Electronically Signed   By: Logan Bores M.D.   On: 04/18/2022 17:02    Labs on Admission: I have personally reviewed following  labs  CBC: Recent Labs  Lab 04/18/22 1446  WBC 8.7  NEUTROABS 6.7  HGB 12.6*  HCT 37.4*  MCV 100.3*  PLT [id number]   Basic Metabolic Panel: Recent Labs  Lab 04/18/22 1446  NA 137  K 4.1  CL 102  CO2 25  GLUCOSE 112*  BUN 27*  CREATININE 0.78  CALCIUM 8.9   GFR: Estimated Creatinine Clearance: 83.2 mL/min (by C-G formula based on SCr of 0.78 mg/dL).  Liver Function Tests: Recent Labs  Lab 04/18/22 1446  AST 63*  ALT 34  ALKPHOS 56  BILITOT 1.8*  PROT 7.8  ALBUMIN 3.8   Coagulation Profile:  Urine analysis:    Component Value Date/Time   COLORURINE AMBER (A) 04/18/2022 1700   APPEARANCEUR HAZY (A) 04/18/2022 1700   APPEARANCEUR Clear 03/09/2022 0817   LABSPEC 1.024 04/18/2022 1700   PHURINE 5.0 04/18/2022 1700   GLUCOSEU NEGATIVE 04/18/2022 1700   HGBUR NEGATIVE 04/18/2022 1700   BILIRUBINUR NEGATIVE 04/18/2022 1700   BILIRUBINUR Negative 03/09/2022 0817   KETONESUR NEGATIVE 04/18/2022 1700   PROTEINUR 100 (A) 04/18/2022 1700   NITRITE NEGATIVE 04/18/2022 1700   LEUKOCYTESUR NEGATIVE 04/18/2022 1700   This document was prepared using Dragon Voice Recognition software and may include unintentional dictation errors.  Dr. Tobie Poet [facility]  If 7PM-7AM, please contact overnight-coverage provider If 7AM-7PM, please contact day coverage provider [email or web address]  04/18/2022, 9:49 PM

## 2022-04-18 NOTE — ED Notes (Signed)
Called pt's POA to give update. Was unable to reach him and left a voicemail.

## 2022-04-18 NOTE — ED Provider Notes (Signed)
[facility]    CSN: [id number] Arrival date & time: 04/18/22  1338      History   Chief Complaint Chief Complaint  Patient presents with   Altered Mental Status    HPI Gary Dawson is a 82 y.o. male.   HPI  82 year old male with a significant past medical history of CVA, hypertension, persistent atrial fibrillation on anticoagulant therapy presents with altered mental status.  He presented to this urgent care yesterday for weakness in his legs that was also associated with pain and urinary incontinence.  He has been seen 2 days prior for back pain and leg pain and prescribed baclofen and tramadol.  On the second visit he fell coming to the front doors in the lobby and struck his head.  He opted not to have imaging done at that time.  Past Medical History:  Diagnosis Date   Atypical chest pain    a. 03/2012 St echo: nl EF, no wma's.   CVA (cerebral vascular accident)    Depression    Dysrhythmia    Elevated PSA    Essential hypertension    Hyperlipidemia    MVP (mitral valve prolapse)    Persistent atrial fibrillation    a. CHA2DS2VASc = 4-->chronic Xarelto.   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   Sleep disturbance 11/04/2013   Pure hypercholesterolemia 05/22/2013   Benign essential hypertension 05/08/2013   Hyperlipidemia 05/08/2013   Shortness of breath 05/08/2013   Permanent atrial fibrillation 05/06/2013   CVA (cerebral vascular accident) 05/06/2013   Benign localized prostatic hyperplasia with lower urinary tract symptoms (LUTS) 03/06/2012    Calculus in bladder 03/06/2012   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   Elevated prostate specific antigen (PSA) 03/06/2012   Incomplete emptying of bladder 03/06/2012   Kidney stone 03/06/2012   Microscopic hematuria 03/06/2012   Sleep apnea in adult 08/27/2003   Enlarged prostate 08/26/1973   Irregular heart beat 08/26/1973    Past Surgical History:  Procedure Laterality Date   COLONOSCOPY WITH PROPOFOL     CYSTOSCOPY WITH LITHOLAPAXY N/A 11/01/2018   Procedure: CYSTOSCOPY WITH LITHOLAPAXY;  Surgeon: Billey Co, MD;  Location: [facility];  Service: Urology;  Laterality: N/A;   HERNIA REPAIR     HOLEP-LASER ENUCLEATION OF THE PROSTATE WITH MORCELLATION N/A 11/01/2018   Procedure: HOLEP-LASER ENUCLEATION OF THE PROSTATE WITH MORCELLATION;  Surgeon: Billey Co, MD;  Location: [facility];  Service: Urology;  Laterality: N/A;   TONSILLECTOMY     VARICOSE VEIN SURGERY         Home Medications    Prior to Admission medications   Medication Sig Start Date End Date Taking? Authorizing Provider  diltiazem (CARDIZEM CD) 120 MG 24 hr capsule Take 1 capsule (120 mg) by mouth once daily at night 02/24/22  Yes Gollan, Kathlene November, MD  hydroxypropyl methylcellulose / hypromellose (ISOPTO TEARS / GONIOVISC) 2.5 % ophthalmic solution Place 1 drop into both eyes 2 (two) times daily as needed for dry eyes.   Yes [provider]  metoprolol succinate (TOPROL-XL) 50 MG 24  hr tablet Take 1 tablet (50 mg total) by mouth daily. 02/24/22 02/19/23 Yes Gollan, Kathlene November, MD  mirabegron ER (MYRBETRIQ) 50 MG TB24 tablet Take 1 tablet (50 mg total) by mouth daily. 03/30/22  Yes McGowan, Larene Beach A, PA-C  traMADol (ULTRAM) 50 MG tablet Take 1 tablet (50 mg total) by mouth every 12 (twelve) hours as needed for moderate pain or severe pain. 04/15/22  Yes Cook, Jayce G, DO  VITAMIN D, ERGOCALCIFEROL, PO Take 1,000 Int'l Units by mouth daily.   Yes [provider]  XARELTO 20 MG TABS tablet TAKE 1 TABLET  EVERY DAY WITH SUPPER 07/22/21  Yes Gollan, Kathlene November, MD    Family History Family History  Problem Relation Age of Onset   Heart attack Father    Hypertension Father    Hyperlipidemia Father     Social History Social History   Tobacco Use   Smoking status: Never   Smokeless tobacco: Never  Vaping Use   Vaping Use: Never used  Substance Use Topics   Alcohol use: Yes    Alcohol/week: 1.0 standard drink of alcohol    Types: 1 Glasses of wine per week    Comment: social    Drug use: No     Allergies   Other   Review of Systems Review of Systems  Psychiatric/Behavioral:  Positive for confusion.      Physical Exam Triage Vital Signs ED Triage Vitals  Enc Vitals Group     BP      Pulse      Resp      Temp      Temp src      SpO2      Weight      Height      Head Circumference      Peak Flow      Pain Score      Pain Loc      Pain Edu?      Excl. in [place]?    No data found.  Updated Vital Signs BP (!) 129/104 (BP Location: Left Arm)   Pulse 91   Temp 99.7 F (37.6 C) (Oral)   Ht 6\' 1"  (1.854 m)   Wt 194 lb 0.1 oz (88 kg)   SpO2 96%   BMI 25.60 kg/m   Visual Acuity Right Eye Distance:   Left Eye Distance:   Bilateral Distance:    Right Eye Near:   Left Eye Near:    Bilateral Near:     Physical Exam Vitals and nursing note reviewed.  Neurological:     Mental Status: He is disoriented.      UC Treatments / Results  Labs (all labs ordered are listed, but only abnormal results are displayed) Labs Reviewed - No data to display  EKG   Radiology No results found.  Procedures Procedures (including critical care time)  Medications Ordered in UC Medications - No data to display  Initial Impression / Assessment and Plan / UC Course  I have reviewed the triage vital signs and the nursing notes.  Pertinent labs & imaging results that were available during my care of the patient were reviewed by me and considered in my medical decision  making (see chart for details).   The patient is a pleasant 82 year old male who has a history of a recent fall and closed head injury while on Xarelto.  He presented to this urgent care yesterday for evaluation of pain in both of his legs, which  was his third visit to this urgent care for similar complaint.  During the course of assessment patient reports that he has been experiencing progressive weakness in both of his legs along with urinary incontinence or time several months.  The patient was directed to go to [facility] and he stated that he would have his ride taken but he never arrived.  He returned to this urgent care today because he did not understand what was being said on his AVS and he did not remember the conversation initially about going to the emergency department yesterday.  He is alert to person and time but not place.  Due to his mild altered mental status and his recent closed head injury on blood thinners I feel that he needs to be evaluated in the emergency department.  We will transfer him to [facility] via EMS.  Report was called to [facility], the charge nurse in the ER at [facility].  Report given to paramedic with [facility].  Care transferred.  Final Clinical Impressions(s) / UC Diagnoses   Final diagnoses:  Disorientation  Bilateral leg weakness  Urinary incontinence, unspecified type     Discharge Instructions      Please go to the emergency department for evaluation of your disorientation as well as your bilateral leg weakness and urinary incontinence.     ED Prescriptions   None    PDMP not reviewed this encounter.   Margarette Canada, NP 04/18/22 [phone]

## 2022-04-18 NOTE — ED Notes (Signed)
Patient is being discharged from the Urgent Care and sent to the Emergency Department via EMS . Per Margarette Canada NP, patient is in need of higher level of care due to Altered Mental Status. Patient is aware and verbalizes understanding of plan of care.  Vitals:   04/18/22 1346  BP: (!) 129/104  Pulse: 91  Temp: 99.7 F (37.6 C)  SpO2: 96%

## 2022-04-18 NOTE — Discharge Instructions (Signed)
Please go to the emergency department for evaluation of your disorientation as well as your bilateral leg weakness and urinary incontinence.

## 2022-04-18 NOTE — Assessment & Plan Note (Signed)
Resumed home diltiazem 120 mg nightly and home metoprolol succinate 50 mg daily on admission

## 2022-04-18 NOTE — ED Triage Notes (Signed)
Pt comes in via [facility] from [facility] with complaints of lower back pain from a fall. According to EMS staff at the urgent care said that the patient seems to be altered in their opinion. Pt states that he fell yesterday and hit his head. Pt states that he fell today and landed on his back. Pt is rating his pain 8/10 at this time.  Pt has a history of AFIB, and takes carvedilol and xarelto. Pt is alert and oriented x4, and with no signs of distress at this time.

## 2022-04-18 NOTE — ED Triage Notes (Addendum)
Pt presented to UC with confusion from his discharge from his visit yesterday. Pt was sent to the ED needing a further work up from fall. Pt is on a blood thinner.  After speaking with patient, patient noted to be confused and disoriented compared to presentation yesterday. Pt states he was dropped off in clinic by his friend.

## 2022-04-18 NOTE — Assessment & Plan Note (Signed)
-   CPAP nightly ordered 

## 2022-04-18 NOTE — Hospital Course (Addendum)
[name] is a 82 year old male with history of permanent atrial fibrillation on Xarelto, cardiomyopathy, OSA on CPAP, history of hernia repair, history of tonsillectomy, who presents to the emergency department from urgent care center for chief concerns frequent falls and was sent to the ED for altered mental status.  Vitals in the emergency department showed temperature of 99.7, respiration rate 29, heart rate 98, blood pressure 129/104, SpO2 of 96% on room air.  Serum sodium is 137, potassium 4.1, chloride 102, bicarb 25, BUN of 27, serum creatinine 0.78, nonfasting blood glucose 112, EGFR greater than 60, WBC 8.7, hemoglobin 12.6, platelets of 166.  High since troponin is 19.  Lactic acid is 1.7.  CT of the head without contrast and CT cervical spine without contrast: Was read as no evidence of acute intracranial abnormality.  No acute cervical spine fracture.  Mild chronic small vessel ischemic disease and unchanged chronic ventriculomegaly.  CT abdomen pelvis with contrast: Was read as infrarenal saccular abdominal aortic aneurysm measuring up to 5.1 cm in maximum dimension.  Vascular surgical consultation is recommended.  Borderline prominence of gas and stool query mild constipation.  Moderate cardiomegaly with atherosclerotic calcification of the coronary arteries.  5 x 4 mm right middle lobe pulmonary nodule, no follow-up imaging of this nodule recommended.  Aortic and coronary atherosclerosis.  2.3 x 2.0 cm exophytic hypodense lesion in the right kidney and upper pole with internal density of 14 Hounsfield units.  No follow-up of this lesion is recommended.  CT L-spine: Was read as no acute osseous abnormality identified in the lumbar spine.  Lumbar disc and facet degeneration and moderate spinal stenosis at L3-4 and L4-5  ED treatment: Diltiazem 10 mg IV one-time dose, diltiazem gtt., sodium chloride 1 L bolus.  4/3: Vital stable.  Heart rate 80.  Cardiology and vascular surgery  on board.  Patient had high risk aneurysm which need urgent intervention, will need a cardiac cath and goals of care discussion before that.  B12 elevated above 2500. Cardizem infusion was discontinued and patient was started on metoprolol. Echocardiogram with further decreased LVEF to 30 to 35%.  It was 45% on prior echo. Patient was started on heparin infusion while holding Xarelto and most likely will be getting his cardiac cath on Thursday afternoon or Friday morning.  4/4: Hemodynamically stable with well-controlled heart rate this morning.  Awaiting cardiac catheterization.  4/5: Heart rate remained mildly elevated in low 100s.  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  EF was moderately to severely reduced by echo.  Right heart cath shows mildly elevated left filling pressure, mild pulmonary hypertension and normal cardiac output.. Metoprolol dose was increased and they placed him as moderate risk for surgical interventions.  Cardiology also started him on digoxin. Vascular surgery will do procedure in 2 weeks-heparin is being switched with Xarelto  PT is recommending SNF.  TOC consult placed.  4/6: Hemodynamically stable.  Labs stable.  Awaiting SNF now.  4/7: Remained stable with improved heart rate.  Awaiting SNF placement, now had a bed offer-need to get insurance authorization.  4/8: Vitals and BMP stable.  Digoxin levels improving but still subtherapeutic at 0.5.  Low-dose losartan was added by cardiology with consideration to switching to [facility] as outpatient.  Awaiting insurance authorization for SNF.  4/9: Patient remained stable.  Awaiting insurance authorization. Patient need to hold Xarelto 2 days  prior to his surgery for AAA

## 2022-04-18 NOTE — ED Provider Notes (Signed)
[facility] Provider Note    Event Date/Time   First MD Initiated Contact with Patient 04/18/22 [phone]     (approximate)   History   Back Pain   HPI  Terreance Valcourt Towry is a 82 y.o. male  here with generalized weakness, falls, dysuria/frequency. Pt reports that over the past several weeks he has had progressively worsening urinary frequency and urgency.  He has also noticed generalized weakness.  Said decreased appetite.  He has fallen multiple times in the last week due to his legs "giving out."  He has fallen 3 times in the last 48 hours or so.  He states he did hit his lower back and has had some aching pain since then.  He continues to have urinary frequency and feeling like he is not emptying out fully.  He has a history of A-fib.  Denies any chest pain.  States he feels generally weak.  He went to urgent care today and was sent in for further evaluation.     Physical Exam   Triage Vital Signs: ED Triage Vitals  Enc Vitals Group     BP 04/18/22 1442 (!) 150/97     Pulse Rate 04/18/22 1442 (!) 115     Resp 04/18/22 1442 (!) 29     Temp 04/18/22 1442 98.2 F (36.8 C)     Temp src --      SpO2 04/18/22 1442 99 %     Weight --      Height --      Head Circumference --      Peak Flow --      Pain Score 04/18/22 1443 8     Pain Loc --      Pain Edu? --      Excl. in [place]? --     Most recent vital signs: Vitals:   04/18/22 1442 04/18/22 1600  BP: (!) 150/97 (!) 148/89  Pulse: (!) 115 99  Resp: (!) 29 19  Temp: 98.2 F (36.8 C)   SpO2: 99% 94%     General: Awake, no distress.  CV:  Good peripheral perfusion.  Tachycardic, irregular rhythm. Resp:  Normal work of breathing.  Mild tachypnea noted but lungs clear. Abd:  No distention.  Minimal suprapubic tenderness.  No fullness.  No distention.  No peritonitis. Other:  Mildly dry mucous membranes.  No focal neurological deficits.   ED Results / Procedures / Treatments   Labs (all labs  ordered are listed, but only abnormal results are displayed) Labs Reviewed  CBC WITH DIFFERENTIAL/PLATELET - Abnormal; Notable for the following components:      Result Value   RBC 3.73 (*)    Hemoglobin 12.6 (*)    HCT 37.4 (*)    MCV 100.3 (*)    Monocytes Absolute 1.2 (*)    All other components within normal limits  COMPREHENSIVE METABOLIC PANEL - Abnormal; Notable for the following components:   Glucose, Bld 112 (*)    BUN 27 (*)    AST 63 (*)    Total Bilirubin 1.8 (*)    All other components within normal limits  URINALYSIS, ROUTINE W REFLEX MICROSCOPIC - Abnormal; Notable for the following components:   Color, Urine AMBER (*)    APPearance HAZY (*)    Protein, ur 100 (*)    All other components within normal limits  TROPONIN I (HIGH SENSITIVITY) - Abnormal; Notable for the following components:   Troponin I (High Sensitivity) 19 (*)  All other components within normal limits  LACTIC ACID, PLASMA  LACTIC ACID, PLASMA     EKG Atrial fibrillation with rapid ventricular response, ventricular 138.  QRS 82, QTc 476.  No acute ST elevations.  Nonspecific ST changes, likely rate related.   RADIOLOGY CT head/C-spine:No acute abnormality, no C Spine injury Chest x-ray:Clear CT Abdo/pelvis:No acute intra-abd pathology. 5.1cm saccular aneurysm. CT lumbar spine:No acute fx/injury, moderate spinal stenossi   I also independently reviewed and agree with radiologist interpretations.   PROCEDURES:  Critical Care performed: Yes, see critical care procedure note(s)  .Critical Care  Performed by: Duffy Bruce, MD Authorized by: Duffy Bruce, MD   Critical care provider statement:    Critical care time (minutes):  30   Critical care time was exclusive of:  Separately billable procedures and treating other patients   Critical care was necessary to treat or prevent imminent or life-threatening deterioration of the following conditions:  Cardiac failure, circulatory  failure and respiratory failure   Critical care was time spent personally by me on the following activities:  Development of treatment plan with patient or surrogate, discussions with consultants, evaluation of patient's response to treatment, examination of patient, ordering and review of laboratory studies, ordering and review of radiographic studies, ordering and performing treatments and interventions, pulse oximetry, re-evaluation of patient's condition and review of old [place] ED: Medications  diltiazem (CARDIZEM) 1 mg/mL load via infusion 10 mg (has no administration in time range)    And  diltiazem (CARDIZEM) 125 mg in dextrose 5% 125 mL (1 mg/mL) infusion (has no administration in time range)  sodium chloride 0.9 % bolus 1,000 mL (1,000 mLs Intravenous New Bag/Given 04/18/22 1709)  diltiazem (CARDIZEM) injection 10 mg (10 mg Intravenous Given 04/18/22 1657)  iohexol (OMNIPAQUE) 300 MG/ML solution 100 mL (100 mLs Intravenous Contrast Given 04/18/22 1633)     IMPRESSION / MDM / [place] / ED COURSE  I reviewed the triage vital signs and the nursing notes.                              Differential diagnosis includes, but is not limited to, UTI, BPH with retention and frequency, AKI, AFib RVR, ACS, anemia, polypharmacy, deconditioning  Patient's presentation is most consistent with acute presentation with potential threat to life or bodily function.  The patient is on the cardiac monitor to evaluate for evidence of arrhythmia and/or significant heart rate changes   82 year old male with history of A-fib on Eliquis, hyperlipidemia, chest pain, here with generalized weakness, back pain after fall.  Regarding his weakness, suspect possible symptomatic A-fib and the patient appears to be in RVR here.  Differential includes possible UTI although urinalysis obtained, reviewed, is unremarkable.  Lactic acid is normal.  No ST elevations or signs of acute  ischemia.  Troponin 19 which I suspect is demand related.  CT imaging obtained, shows no acute injury.  Does have significant lumbar spondylosis which I suspect is the etiology of his back pain.  CT abdomen/pelvis obtained, query mild constipation.  The patient does have a saccular aneurysm as well measuring up to 5.1 cm.  There is no stranding.  I do not suspect this is the etiology of his back pain and his back pain is more consistent with musculoskeletal pain after a fall.  However, did discuss with Dr. Laurence Compton, who will see the patient as an inpatient.  Likely will need cardiology clearance for more urgent repair in the next several weeks.  Patient remains in RVR after single dose of diltiazem.  Will place on drip and admit to medicine.   FINAL CLINICAL IMPRESSION(S) / ED DIAGNOSES   Final diagnoses:  Abdominal aortic aneurysm (AAA) without rupture, unspecified part  Atrial fibrillation with rapid ventricular response     Rx / DC Orders   ED Discharge Orders     None        Note:  This document was prepared using Dragon voice recognition software and may include unintentional dictation errors.   Duffy Bruce, MD 04/18/22 [phone]

## 2022-04-18 NOTE — Assessment & Plan Note (Addendum)
Generalized weakness Query deconditioning however given patient's age, B12 elevated Can be due to worsening cardiomyopathy. -PT/OT are recommending home health

## 2022-04-19 ENCOUNTER — Inpatient Hospital Stay (HOSPITAL_COMMUNITY)
Admit: 2022-04-19 | Discharge: 2022-04-19 | Disposition: A | Discharge status: Home / Self Care | Payer: Medicare HMO | Attending: Internal Medicine | Admitting: Internal Medicine

## 2022-04-19 DIAGNOSIS — Z01810 Encounter for preprocedural cardiovascular examination: Secondary | ICD-10-CM

## 2022-04-19 DIAGNOSIS — I4821 Permanent atrial fibrillation: Secondary | ICD-10-CM | POA: Diagnosis not present

## 2022-04-19 DIAGNOSIS — R531 Weakness: Secondary | ICD-10-CM | POA: Diagnosis not present

## 2022-04-19 DIAGNOSIS — I428 Other cardiomyopathies: Secondary | ICD-10-CM

## 2022-04-19 DIAGNOSIS — I2510 Atherosclerotic heart disease of native coronary artery without angina pectoris: Secondary | ICD-10-CM

## 2022-04-19 DIAGNOSIS — I5020 Unspecified systolic (congestive) heart failure: Secondary | ICD-10-CM

## 2022-04-19 DIAGNOSIS — I4891 Unspecified atrial fibrillation: Secondary | ICD-10-CM | POA: Diagnosis not present

## 2022-04-19 DIAGNOSIS — I7143 Infrarenal abdominal aortic aneurysm, without rupture: Secondary | ICD-10-CM

## 2022-04-19 DIAGNOSIS — I10 Essential (primary) hypertension: Secondary | ICD-10-CM

## 2022-04-19 DIAGNOSIS — I429 Cardiomyopathy, unspecified: Secondary | ICD-10-CM

## 2022-04-19 DIAGNOSIS — G4733 Obstructive sleep apnea (adult) (pediatric): Secondary | ICD-10-CM

## 2022-04-19 LAB — ECHOCARDIOGRAM COMPLETE
AR max vel: 3.35 cm2
AV Area VTI: 3.35 cm2
AV Area mean vel: 3.27 cm2
AV Mean grad: 2 mmHg
AV Peak grad: 3.1 mmHg
Ao pk vel: 0.88 m/s
Area-P 1/2: 6.43 cm2
Calc EF: 39.5 %
Height: 73 in
P 1/2 time: 595 msec
S' Lateral: 4.9 cm
Single Plane A2C EF: 44.7 %
Single Plane A4C EF: 33.9 %
Weight: 3104.08 oz

## 2022-04-19 LAB — BASIC METABOLIC PANEL
Anion gap: 9 (ref 5–15)
BUN: 23 mg/dL (ref 8–23)
CO2: 24 mmol/L (ref 22–32)
Calcium: 8.4 mg/dL — ABNORMAL LOW (ref 8.9–10.3)
Chloride: 105 mmol/L (ref 98–111)
Creatinine, Ser: 0.72 mg/dL (ref 0.61–1.24)
GFR, Estimated: >60 mL/min (ref >60)
Glucose, Bld: 104 mg/dL — ABNORMAL HIGH (ref 70–99)
Potassium: 3.7 mmol/L (ref 3.5–5.1)
Sodium: 138 mmol/L (ref 135–145)

## 2022-04-19 LAB — TROPONIN I (HIGH SENSITIVITY): Troponin I (High Sensitivity): 16 ng/L (ref <18)

## 2022-04-19 LAB — APTT: aPTT: 45 seconds — ABNORMAL HIGH (ref 24–36)

## 2022-04-19 LAB — PROTIME-INR
INR: 2.9 — ABNORMAL HIGH (ref 0.8–1.2)
Prothrombin Time: 30.1 seconds — ABNORMAL HIGH (ref 11.4–15.2)

## 2022-04-19 LAB — CBC
HCT: 34.8 % — ABNORMAL LOW (ref 39.0–52.0)
Hemoglobin: 11.6 g/dL — ABNORMAL LOW (ref 13.0–17.0)
MCH: 33.6 pg (ref 26.0–34.0)
MCHC: 33.3 g/dL (ref 30.0–36.0)
MCV: 100.9 fL — ABNORMAL HIGH (ref 80.0–100.0)
Platelets: 162 10*3/uL (ref 150–400)
RBC: 3.45 MIL/uL — ABNORMAL LOW (ref 4.22–5.81)
RDW: 13.1 % (ref 11.5–15.5)
WBC: 6.2 10*3/uL (ref 4.0–10.5)
nRBC: 0 % (ref 0.0–0.2)

## 2022-04-19 LAB — HEPARIN LEVEL (UNFRACTIONATED): Heparin Unfractionated: >1.1 IU/mL — ABNORMAL HIGH (ref 0.30–0.70)

## 2022-04-19 LAB — MAGNESIUM: Magnesium: 2 mg/dL (ref 1.7–2.4)

## 2022-04-19 MED ORDER — METOPROLOL SUCCINATE ER 50 MG PO TB24
50.0000 mg | ORAL_TABLET | Freq: Two times a day (BID) | ORAL | Status: DC
  Administered 2022-04-19 – 2022-04-20 (×3): 50 mg via ORAL
  Filled 2022-04-19 (×3): qty 1

## 2022-04-19 MED ORDER — HEPARIN (PORCINE) 25000 UT/250ML-% IV SOLN
1200.0000 [IU]/h | INTRAVENOUS | Status: DC
  Administered 2022-04-19: 1200 [IU]/h via INTRAVENOUS
  Filled 2022-04-19: qty 250

## 2022-04-19 MED ORDER — ATORVASTATIN CALCIUM 20 MG PO TABS
40.0000 mg | ORAL_TABLET | Freq: Every day | ORAL | Status: DC
  Administered 2022-04-19 – 2022-04-26 (×8): 40 mg via ORAL
  Filled 2022-04-19 (×8): qty 2

## 2022-04-19 MED ORDER — METOPROLOL TARTRATE 25 MG PO TABS
25.0000 mg | ORAL_TABLET | Freq: Once | ORAL | Status: AC
  Administered 2022-04-19: 25 mg via ORAL
  Filled 2022-04-19: qty 1

## 2022-04-19 MED ORDER — ASPIRIN 81 MG PO TBEC
81.0000 mg | DELAYED_RELEASE_TABLET | Freq: Every day | ORAL | Status: DC
  Administered 2022-04-19 – 2022-04-24 (×6): 81 mg via ORAL
  Filled 2022-04-19 (×6): qty 1

## 2022-04-19 NOTE — Consult Note (Signed)
Cardiology Consultation:   Patient ID: Codi Krahenbuhl; [id number]; 1940-09-21   Admit date: 04/18/2022 Date of Consult: 04/19/2022  Primary Care Provider: Baxter Hire, MD Primary Cardiologist: Rockey Situ Primary Electrophysiologist:  None   Patient Profile:   Pollard Nou Bramlette is a 82 y.o. male with a hx of permanent A-fib on rivaroxaban, CVA in 2010, HFrEF, HTN, HLD, and OSA on CPAP who is being seen today for preoperative cardiac risk stratification at the request of Dr. Tobie Poet.  History of Present Illness:   Mr. Silerio' history of A-fib dates back to at least 2008.  He was found to have a CVA in 06/2008.  Echo at that time showed an EF greater than 55%.  Lexiscan MPI from 2016 demonstrated a medium defect of severe severity present in the mid inferior septal, mid inferior, and apical inferior location.  Findings were consistent with prior MI with peri-infarct ischemia.  LVEF 30 to 44%.  Overall, this was felt to be an intermediate risk study.  In follow-up, patient's symptoms were felt to be less likely related to ischemia in follow-up, patient's symptoms were felt to be less likely related to ischemia and further testing was deferred.  Echo in 05/2020 demonstrated an EF of 45%, global hypokinesis, mild LVH, moderately reduced RV systolic function with mildly enlarged ventricular cavity size, mildly elevated PASP, moderately dilated left atrium, severely dilated right atrium, trivial mitral regurgitation with moderate mitral annular calcification, severe tricuspid regurgitation, mild aortic regurgitation, and mild to moderate aortic valve sclerosis without evidence of stenosis.  He underwent Lexiscan MPI 08/2020 that was abnormal and potentially risk with a moderate-sized, moderate severity, partially reversible defect involving the inferoseptal myocardium with an LVEF of 25%.  CT attenuated corrected images that show dense coronary calcification as well as aortic atherosclerosis.   Allowing for differences in technique, the inferolateral defect was similar to study in 2016.  In follow-up, further testing was deferred.  More recently, the patient has been evaluated at an outside urgent care for several days in a row for lower back pain, fall, urinary incontinence, lower extremity weakness, and concern for disorientation.  Given recurrent visits to urgent care, ED evaluation was recommended for ongoing evaluation and management.  He was subsequently admitted to [facility] on 04/18/2022 with generalized weakness and found to have A-fib with RVR.  CT head showed no evidence of acute intracranial abnormality with mild chronic small vessel ischemic disease.  CT of the cervical spine showed no acute cervical fracture.  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  CT of the lumbar spine showed no acute osseous abnormality with lumbar disc and facet degeneration and moderate spinal stenosis at L3-4 and L4-5.  Chest x-ray showed stable enlarged cardiac silhouette with no acute intrathoracic process.  Initially, the patient was noted to be in A-fib with RVR and hemodynamically stable.  He was continued on PTA Toprol and diltiazem along with rivaroxaban.  High-sensitivity troponin 19.  Cardiology is consulted for preoperative risk stratification for possible repair of the patient's AAA.  Upon cardiology evaluation, the patient reported a several month history of increased exertional shortness of breath and fatigue.  He reports he previously used his self-propelled mower to [employer] multiple yards, upwards of 2 acres, though has been unable to do this for quite some time secondary to exertional shortness of breath and fatigue.  He also reports multiple falls over the past  several weeks, and upwards of 3 times in the past couple of days.  He denies any hematochezia or melena.  He reports he has noted some cognitive  decline and himself.  No significant lower extremity swelling or orthopnea.   Past Medical History:  Diagnosis Date   Atypical chest pain    a. 03/2012 St echo: nl EF, no wma's.   CVA (cerebral vascular accident)    Depression    Dysrhythmia    Elevated PSA    Essential hypertension    Hyperlipidemia    MVP (mitral valve prolapse)    Persistent atrial fibrillation    a. CHA2DS2VASc = 4-->chronic Xarelto.   Psoriasis    Reflux    Sleep apnea    Vitamin B 12 deficiency     Past Surgical History:  Procedure Laterality Date   COLONOSCOPY WITH PROPOFOL     CYSTOSCOPY WITH LITHOLAPAXY N/A 11/01/2018   Procedure: CYSTOSCOPY WITH LITHOLAPAXY;  Surgeon: Billey Co, MD;  Location: [facility] ORS;  Service: Urology;  Laterality: N/A;   HERNIA REPAIR     HOLEP-LASER ENUCLEATION OF THE PROSTATE WITH MORCELLATION N/A 11/01/2018   Procedure: HOLEP-LASER ENUCLEATION OF THE PROSTATE WITH MORCELLATION;  Surgeon: Billey Co, MD;  Location: [facility] ORS;  Service: Urology;  Laterality: N/A;   TONSILLECTOMY     VARICOSE VEIN SURGERY       Home Meds: Prior to Admission medications   Medication Sig Start Date End Date Taking? Authorizing Provider  diltiazem (CARDIZEM CD) 120 MG 24 hr capsule Take 1 capsule (120 mg) by mouth once daily at night 02/24/22  Yes Gollan, Kathlene November, MD  hydroxypropyl methylcellulose / hypromellose (ISOPTO TEARS / GONIOVISC) 2.5 % ophthalmic solution Place 1 drop into both eyes 2 (two) times daily as needed for dry eyes.   Yes [provider]  metoprolol succinate (TOPROL-XL) 50 MG 24 hr tablet Take 1 tablet (50 mg total) by mouth daily. 02/24/22 02/19/23 Yes Gollan, Kathlene November, MD  mirabegron ER (MYRBETRIQ) 50 MG TB24 tablet Take 1 tablet (50 mg total) by mouth daily. 03/30/22  Yes McGowan, Larene Beach A, PA-C  traMADol (ULTRAM) 50 MG tablet Take 1 tablet (50 mg total) by mouth every 12 (twelve) hours as needed for moderate pain or severe pain. 04/15/22  Yes Cook, Jayce G,  DO  VITAMIN D, ERGOCALCIFEROL, PO Take 1,000 Int'l Units by mouth daily.   Yes [provider]  XARELTO 20 MG TABS tablet TAKE 1 TABLET EVERY DAY WITH SUPPER 07/22/21  Yes Gollan, Kathlene November, MD    Inpatient Medications: Scheduled Meds:  lidocaine  2 patch Transdermal Q24H   metoprolol succinate  50 mg Oral BID   Continuous Infusions:  heparin     PRN Meds: acetaminophen **OR** acetaminophen, ondansetron **OR** ondansetron (ZOFRAN) IV, polyvinyl alcohol, senna-docusate, traMADol  Allergies:   Allergies  Allergen Reactions   Other Other (See Comments)    Trees and grasses     Social History:   Social History   Socioeconomic History   Marital status: Divorced    Spouse name: Not on file   Number of children: Not on file   Years of education: Not on file   Highest education level: Not on file  Occupational History   Not on file  Tobacco Use   Smoking status: Never   Smokeless tobacco: Never  Vaping Use   Vaping Use: Never used  Substance and Sexual Activity   Alcohol use: Yes    Alcohol/week: 1.0  standard drink of alcohol    Types: 1 Glasses of wine per week    Comment: social    Drug use: No   Sexual activity: Not Currently  Other Topics Concern   Not on file  Social History Narrative   Not on file   Social Determinants of Health   Financial Resource Strain: Not on file  Food Insecurity: No Food Insecurity (04/19/2022)   Hunger Vital Sign    Worried About Running Out of Food in the Last Year: Never true    Ran Out of Food in the Last Year: Never true  Transportation Needs: No Transportation Needs (04/19/2022)   PRAPARE - Hydrologist (Medical): No    Lack of Transportation (Non-Medical): No  Physical Activity: Not on file  Stress: Not on file  Social Connections: Not on file  Intimate Partner Violence: Not At Risk (04/19/2022)   Humiliation, Afraid, Rape, and Kick questionnaire    Fear of Current or Ex-Partner: No     Emotionally Abused: No    Physically Abused: No    Sexually Abused: No     Family History:   Family History  Problem Relation Age of Onset   Heart attack Father    Hypertension Father    Hyperlipidemia Father     ROS:  Review of Systems  Constitutional:  Positive for malaise/fatigue. Negative for chills, diaphoresis, fever and weight loss.  HENT:  Negative for congestion.   Eyes:  Negative for discharge and redness.  Respiratory:  Positive for shortness of breath. Negative for cough, sputum production and wheezing.   Cardiovascular:  Negative for chest pain, palpitations, orthopnea, claudication, leg swelling and PND.  Gastrointestinal:  Negative for abdominal pain, blood in stool, heartburn, melena, nausea and vomiting.  Musculoskeletal:  Positive for back pain and falls. Negative for myalgias.  Skin:  Negative for rash.  Neurological:  Positive for weakness. Negative for dizziness, tingling, tremors, sensory change, speech change, focal weakness and loss of consciousness.       Question of cognitive decline  Endo/Heme/Allergies:  Does not bruise/bleed easily.  Psychiatric/Behavioral:  Negative for substance abuse. The patient is not nervous/anxious.       Physical Exam/Data:   Vitals:   04/19/22 0900 04/19/22 1002 04/19/22 1030 04/19/22 1209  BP: (!) 124/98 112/66 124/78 138/82  Pulse: (!) 107 (!) 130 (!) 111 98  Resp:  20 20 18   Temp:    98.1 F (36.7 C)  TempSrc:    Oral  SpO2: 95% 99% 95% 100%  Weight:    88 kg  Height:    6\' 1"  (1.854 m)    Intake/Output Summary (Last 24 hours) at 04/19/2022 1322 Last data filed at 04/18/2022 2032 Gross per 24 hour  Intake 15.43 ml  Output 400 ml  Net -384.57 ml   Filed Weights   04/19/22 1209  Weight: 88 kg   Body mass index is 25.6 kg/m.   Physical Exam: General: Well developed, well nourished, in no acute distress. Head: Normocephalic, atraumatic, sclera non-icteric, no xanthomas, nares without discharge.  Neck:  Negative for carotid bruits. JVD not elevated. Lungs: Clear bilaterally to auscultation without wheezes, rales, or rhonchi. Breathing is unlabored. Heart: IRIR with S1 S2. I/VI systolic murmur RUSB, no rubs, or gallops appreciated. Abdomen: Soft, non-tender, non-distended with normoactive bowel sounds. No hepatomegaly. No rebound/guarding. No obvious abdominal masses. Msk:  Strength and tone appear normal for age. Extremities: No clubbing or cyanosis. No edema. Distal  pedal pulses are 2+ and equal bilaterally. Neuro: Alert and oriented X 3. No facial asymmetry. No focal deficit. Moves all extremities spontaneously. Psych:  Responds to questions appropriately with a normal affect.   EKG:  The EKG was personally reviewed and demonstrates: A-fib with RVR, 138 bpm, early repolarization abnormality, nonspecific ST-T changes Telemetry:  Telemetry was personally reviewed and demonstrates: A-fib with RVR with ventricular rates in the 140s bpm improving to the 90s to low 100s bpm  Weights: Filed Weights   04/19/22 1209  Weight: 88 kg    Relevant CV Studies:  2D echo 04/19/2022: Pending __________  Carlton Adam MPI 08/18/2020: Abnormal, potentially high risk pharmacologic myocardial perfusion stress test. There is a moderate in size, moderate in severity, partially reversible defect involving the inferoseptal myocardium. The left ventricular ejection fraction is severely decreased (25%). Dense coronary artery calcification as well as aortic atherosclerosis are noted on the attenuation correction CT. Allowing for differences in technique, the inferolateral defect is similar to the prior study on 11/06/2014. __________  2D echo 05/18/2020: 1. Left ventricular ejection fraction, by estimation, is 45%. The left  ventricle has mild to moderately decreased function. The left ventricle  demonstrates global hypokinesis. There is mild left ventricular  hypertrophy. Left ventricular diastolic  parameters are  indeterminate.   2. Right ventricular systolic function is moderately reduced. The right  ventricular size is mildly enlarged. There is mildly elevated pulmonary  artery systolic pressure.   3. Left atrial size was moderately dilated.   4. Right atrial size was severely dilated.   5. The mitral valve is degenerative. Trivial mitral valve regurgitation.  Moderate mitral annular calcification.   6. Tricuspid valve regurgitation is severe.   7. The aortic valve is tricuspid. There is moderate thickening of the  aortic valve. Aortic valve regurgitation is mild. Mild to moderate aortic  valve sclerosis/calcification is present, without any evidence of aortic  stenosis.   8. Aortic dilatation noted. There is mild dilatation of the aortic root  and of the ascending aorta, measuring 42 mm.   9. The inferior vena cava is dilated in size with <50% respiratory  variability, suggesting right atrial pressure of 15 mmHg.  10. Evidence of atrial level shunting detected by color flow Doppler.  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  __________  Carlton Adam MPI 11/06/2014: There was no ST segment deviation noted during stress. Defect 1: There is a medium defect of severe severity present in the mid inferoseptal, mid inferior and apical inferior location. Findings consistent with prior myocardial infarction with peri-infarct ischemia. This is an intermediate risk study. The left ventricular ejection fraction is moderately decreased (30-44%).   Laboratory Data:  Chemistry Recent Labs  Lab 04/18/22 1446 04/19/22 0512  NA 137 138  K 4.1 3.7  CL 102 105  CO2 25 24  GLUCOSE 112* 104*  BUN 27* 23  CREATININE 0.78 0.72  CALCIUM 8.9 8.4*  GFRNONAA >60 >60  ANIONGAP 10 9    Recent Labs  Lab 04/18/22 1446  PROT 7.8  ALBUMIN 3.8  AST 63*  ALT 34  ALKPHOS 56  BILITOT 1.8*   Hematology Recent Labs  Lab 04/18/22 1446  04/19/22 0512  WBC 8.7 6.2  RBC 3.73* 3.45*  HGB 12.6* 11.6*  HCT 37.4* 34.8*  MCV 100.3* 100.9*  MCH 33.8 33.6  MCHC 33.7 33.3  RDW 13.2 13.1  PLT 166 162   Cardiac EnzymesNo results for  input(s): "TROPONINI" in the last 168 hours. No results for input(s): "TROPIPOC" in the last 168 hours.  BNPNo results for input(s): "BNP", "PROBNP" in the last 168 hours.  DDimer No results for input(s): "DDIMER" in the last 168 hours.  Radiology/Studies:  [facility] Chest Port 1 View  Result Date: 04/18/2022 IMPRESSION: 1. Stable enlarged cardiac silhouette. 2. No acute intrathoracic process. Electronically Signed   By: Randa Ngo M.D.   On: 04/18/2022 18:24   CT ABDOMEN PELVIS W CONTRAST  Result Date: 04/18/2022 IMPRESSION: 1. 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 Vascular surgical consultation is recommended. 2. Borderline prominence of gas and stool in the colon, query mild constipation. 3. Moderate cardiomegaly with atherosclerotic calcification of the coronary arteries. 4. Moderate degenerative hip arthropathy bilaterally. 5. 5 by 4 mm right middle lobe pulmonary nodule. No follow-up imaging of this nodule is recommended. 6. Aortic and coronary atherosclerosis. 7. 2.3 by 2.0 cm exophytic hypodense lesion of the right kidney upper pole with internal density of 14 Hounsfield units and relatively homogeneous density characteristics. No follow up of this lesion is recommended. Aortic Atherosclerosis (ICD10-I70.0). Electronically Signed   By: Van Clines M.D.   On: 04/18/2022 17:12   CT L-SPINE NO CHARGE  Result Date: 04/18/2022 IMPRESSION: 1. No acute osseous abnormality identified in the lumbar spine. 2. 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 Electronically Signed   By: Logan Bores M.D.   On: 04/18/2022 17:11   CT HEAD WO CONTRAST (5MM)  Result Date: 04/18/2022 IMPRESSION: 1. No evidence of acute  intracranial abnormality. 2. Mild chronic small vessel ischemic disease and unchanged chronic ventriculomegaly. 3. No acute cervical spine fracture. Electronically Signed   By: Logan Bores M.D.   On: 04/18/2022 17:02   CT Cervical Spine Wo Contrast  Result Date: 04/18/2022 IMPRESSION: 1. No evidence of acute intracranial abnormality. 2. Mild chronic small vessel ischemic disease and unchanged chronic ventriculomegaly. 3. No acute cervical spine fracture. Electronically Signed   By: Logan Bores M.D.   On: 04/18/2022 17:02    Assessment and Plan:   1.  CAD with exertional dyspnea and elevated high-sensitivity troponin: -Notes a long history of exertional dyspnea and fatigue, possibly concerning for anginal equivalent -Initial high-sensitivity troponin 19, cycle to peak -Patient with 2 intermediate to high risk stress tests in the past -No prior LHC -Echo pending -If patient is agreeable, he will need [facility] for further evaluation of his exertional dyspnea, elevated high-sensitivity troponin, evaluation of cardiomyopathy, and for preprocedure cardiac risk stratification -Heparin drip during admission until it is clear he will no longer require inpatient invasive testing  2.  Permanent A-fib: -Ventricular rate improved -Stop diltiazem given underlying cardiomyopathy -Titrate Toprol-XL to 50 mg twice daily -CHA2DS2-VASc at least 7 (CHF, HTN, age x 2, CVA x 2, vascular disease) -During admission, hold rivaroxaban -Heparin drip  3.  HFrEF: -Etiology of cardiomyopathy is unclear at this time -Appears euvolemic and well compensated -Stop diltiazem given cardiomyopathy -Now on titrated dose of Toprol-XL as outlined above -Escalate GDMT with addition of ARNI, MRA, and SGLT2 inhibitor as able moving forward -Not currently requiring a standing loop diuretic -Will need ischemic evaluation as outlined above  4.  Preoperative cardiac risk stratification: -Difficult to risk stratify patient at this  time, unable to evaluate METs given patient's sedentary lifestyle with noted exertional dyspnea and fatigue -Patient will require further ischemic testing prior to providing formal risk  stratification as outlined above  5.  Infrarenal AAA: -Evaluated by vascular surgery with plans for intervention in the near future following evaluation of the patient's exertional dyspnea, elevated high-sensitivity troponin, and cardiomyopathy as outlined above  6.  Frequent falls with generalized weakness and concern for cognitive decline: -PT/OT consulted -If patient continues to have frequent falls and concerns for cognitive decline, [place] may need to be revisited down the road, this will be deferred to patient's primary cardiologist  7.  HTN: -Blood pressure well-controlled -Now on titrated dose of Toprol-XL for rate control and management of cardiomyopathy without diltiazem as outlined above  8.  HLD: -LDL 82 in 09/2021 with goal being at least less than 70 with dense coronary artery calcification noted on prior CT imaging -Not on statin therapy as outpatient -Start atorvastatin 40 mg daily        For questions or updates, please contact [facility] Please consult [email or web address] for contact info under Cardiology/STEMI.   Signed, Christell Faith, PA-C [facility] Pager: [phone] 04/19/2022, 1:22 PM

## 2022-04-19 NOTE — Progress Notes (Signed)
PT Cancellation Note  Patient Details Name: Gary Dawson MRN: [id number] DOB: May 09, 1940   Cancelled Treatment:    Reason Eval/Treat Not Completed: Medical issues which prohibited therapy. HR in 130s currently. Will continue to monitor and see when appropriate.   Niklas Chretien 04/19/2022, 10:19 AM

## 2022-04-19 NOTE — Assessment & Plan Note (Signed)
Blood pressure currently within goal. -Continue metoprolol -Home Cardizem was discontinued -Most likely he will be started on Entresto by cardiology

## 2022-04-19 NOTE — ED Notes (Signed)
Patient with 8 beat run of vtach. Patient asymptomatic, denies chest pain shortness of breath or palpitations. States he is feeling much better than he did earlier in the night. Morton Amy notified via secure chat.

## 2022-04-19 NOTE — Assessment & Plan Note (Signed)
Patient has an history of CVA.  No acute concerns -Continue aspirin and Lipitor

## 2022-04-19 NOTE — Progress Notes (Signed)
*  PRELIMINARY RESULTS* Echocardiogram 2D Echocardiogram has been performed.  Gary Dawson 04/19/2022, 1:07 PM

## 2022-04-19 NOTE — Assessment & Plan Note (Signed)
-  Continue Lipitor °

## 2022-04-19 NOTE — Evaluation (Signed)
Occupational Therapy Evaluation Patient Details Name: Gary Dawson MRN: [id number] DOB: 08-07-1940 Today's Date: 04/19/2022   History of Present Illness Pt is 82 y/o male with PMH including a-fib, CVA, OSA, weakness. Pt admitted with increasing weakness, back pain, two recent falls per medical record.   Clinical Impression   Patient received for OT evaluation. See flowsheet below for details of function. Generally, patient (I) for bed mobility, CGA with gait belt for functional mobility (side steps), and set up-MIN A for ADLs. Significant concerns for higher level IADLs due to cognitive deficits. Patient will benefit from continued OT while in acute care.       Recommendations for follow up therapy are one component of a multi-disciplinary discharge planning process, led by the attending physician.  Recommendations may be updated based on patient status, additional functional criteria and insurance authorization.   Assistance Recommended at Discharge Frequent or constant Supervision/Assistance  Patient can return home with the following A little help with walking and/or transfers;A little help with bathing/dressing/bathroom;Assistance with cooking/housework;Direct supervision/assist for medications management;Direct supervision/assist for financial management;Assist for transportation;Help with stairs or ramp for entrance    Functional Status Assessment  Patient has had a recent decline in their functional status and demonstrates the ability to make significant improvements in function in a reasonable and predictable amount of time.  Equipment Recommendations  Other (comment) (shower chair)    Recommendations for Other Services PT consult     Precautions / Restrictions Precautions Precautions: Fall Restrictions Weight Bearing Restrictions: No      Mobility Bed Mobility Overal bed mobility: Modified Independent             General bed mobility comments: with extra  time to edge of stretcher    Transfers Overall transfer level: Needs assistance Equipment used: Quad cane Transfers: Sit to/from Stand Sit to Stand: Min guard           General transfer comment: gait belt used for safety. Pt stating "I feel      Balance Overall balance assessment: Needs assistance Sitting-balance support: Feet supported Sitting balance-Leahy Scale: Good     Standing balance support: Single extremity supported Standing balance-Leahy Scale: Poor Standing balance comment: used cane                           ADL either performed or assessed with clinical judgement   ADL Overall ADL's : Needs assistance/impaired Eating/Feeding: Independent Eating/Feeding Details (indicate cue type and reason): Pt with finished meal tray in front of him                 Lower Body Dressing: Set up;Sitting/lateral leans (to don BIL shoes)             Tub/Shower Transfer Details (indicate cue type and reason): Anticipate pt to need assistance for safety 2/2 decreased balance Functional mobility during ADLs: Min guard;Cane (quad cane) General ADL Comments: Pt with several recent falls. Appears to be safe for seated ADLs; good flexibility. Likely to need CGA/SBA for standing ADLs.     Vision Baseline Vision/History: 1 Wears glasses Ability to See in Adequate Light: 0 Adequate       Perception     Praxis      Pertinent Vitals/Pain Pain Assessment Pain Assessment: 0-10 (denies pain at rest; when standing he states that his R side buttocks/low back hurts) Pain Score:  (unrated) Pain Location: buttocks/low back Pain Descriptors / Indicators: Aching Pain Intervention(s): Limited  activity within patient's tolerance     Hand Dominance Right   Extremity/Trunk Assessment Upper Extremity Assessment Upper Extremity Assessment: Overall WFL for tasks assessed   Lower Extremity Assessment Lower Extremity Assessment: Defer to PT evaluation        Communication Communication Communication: Other (comment);No difficulties (some word-finding difficulty noted.)   Cognition Arousal/Alertness: Awake/alert Behavior During Therapy: WFL for tasks assessed/performed Overall Cognitive Status: No family/caregiver present to determine baseline cognitive functioning                                 General Comments: Pt demonstrates deficits in memory (states that today is Thursday and it is March of 2024; it is actually April and Wednesday). Pt is very tangential, sometimes not answering OT's question at all, but rather talking about topic or other topics. Difficulty with word-finding intermittently. Pt is pleasant and cooperative throughout session, following one-step verbal commands consistently. Often needs redirection, as he would continue talking tangentially.     General Comments       Exercises     Shoulder Instructions      Home Living Family/patient expects to be discharged to:: Private residence Living Arrangements: Non-relatives/Friends Available Help at Discharge: Other (Comment) (unknown; pt unable to clarify) Type of Home: House Home Access: Stairs to enter [employer] of Steps: 2 (garage) Entrance Stairs-Rails:  (unknown) Home Layout: Two level Alternate Level Stairs-Number of Steps: flight Alternate Level Stairs-Rails:  (unknown) Bathroom Shower/Tub: Occupational psychologist: Standard     Home Equipment: Cane - quad   Additional Comments: Pt states he goes in through the garage; says his downstairs bathroom is small.      Prior Functioning/Environment Prior Level of Function : Driving;History of Falls (last six months);Independent/Modified Independent             Mobility Comments: Pt states he used to use quad cane sometimes and now will have to use it all the time. States that he hasn't had "falls" but rather "trips" and sounds irritated that people keep saying he fell,  although he does say he has caught himself with his hands. Medical record disagrees, stating pt has fallen on his back, and a fall in the lobby of urgent care. ADLs Comments: Pt states he is (I) with ADLs. States he has grab bars in shower and by the toilet. States that he drives. He says all his renter does for him is "lift heavy things if I need him to", although pt states that he does carry in his own groceries.        OT Problem List: Impaired balance (sitting and/or standing);Decreased cognition;Decreased activity tolerance;Decreased safety awareness      OT Treatment/Interventions: Self-care/ADL training;Therapeutic exercise;Therapeutic activities;Patient/family education    OT Goals(Current goals can be found in the care plan section) Acute Rehab OT Goals Patient Stated Goal: Get better OT Goal Formulation: With patient Time For Goal Achievement: 05/03/22 Potential to Achieve Goals: Good  OT Frequency: Min 2X/week    Co-evaluation              AM-PAC OT "6 Clicks" Daily Activity     Outcome Measure Help from another person eating meals?: None Help from another person taking care of personal grooming?: None Help from another person toileting, which includes using toliet, bedpan, or urinal?: A Little Help from another person bathing (including washing, rinsing, drying)?: A Little Help from another person to  put on and taking off regular upper body clothing?: None Help from another person to put on and taking off regular lower body clothing?: None 6 Click Score: 22   End of Session Equipment Utilized During Treatment: Gait belt;Other (comment) (quad cane) Nurse Communication: Mobility status  Activity Tolerance: Patient tolerated treatment well Patient left: in bed;with call bell/phone within reach  OT Visit Diagnosis: Unsteadiness on feet (R26.81);History of falling (Z91.81)                Time: [id number] OT Time Calculation (min): 28 min Charges:  OT General  Charges $OT Visit: 1 Visit OT Evaluation $OT Eval Moderate Complexity: 1 Mod OT Treatments $Self Care/Home Management : 8-22 mins  Waymon Amato, MS, OTR/L  Vania Rea 04/19/2022, 1:07 PM

## 2022-04-19 NOTE — Assessment & Plan Note (Signed)
Echocardiogram today with further decrease in his EF as compared to the prior done in 2022.  Apparently he also has an high risk stress test and never had left heart catheterization. -For cardiac catheterization in a day or so

## 2022-04-19 NOTE — Consult Note (Signed)
ANTICOAGULATION CONSULT NOTE - Initial Consult  Pharmacy Consult for heparin infusion Indication: atrial fibrillation  Allergies  Allergen Reactions   Other Other (See Comments)    Trees and grasses     Patient Measurements:   Heparin Dosing Weight: 88 kg  Vital Signs: Temp: 98 F (36.7 C) (04/03 0508) Temp Source: Oral (04/03 0508) BP: 128/88 (04/03 0730) Pulse Rate: 80 (04/03 0745)  Labs: Recent Labs    04/18/22 1446 04/19/22 0512  HGB 12.6* 11.6*  HCT 37.4* 34.8*  PLT 166 162  CREATININE 0.78 0.72  TROPONINIHS 19*  --     Estimated Creatinine Clearance: 83.2 mL/min (by C-G formula based on SCr of 0.72 mg/dL).   Medical History: Past Medical History:  Diagnosis Date   Atypical chest pain    a. 03/2012 St echo: nl EF, no wma's.   CVA (cerebral vascular accident)    Depression    Dysrhythmia    Elevated PSA    Essential hypertension    Hyperlipidemia    MVP (mitral valve prolapse)    Persistent atrial fibrillation    a. CHA2DS2VASc = 4-->chronic Xarelto.   Psoriasis    Reflux    Sleep apnea    Vitamin B 12 deficiency     Medications:  Xarelto 20 mg po daily PTA for Afib, last dose 04/19/22 @ 0025  Assessment: 82 yo male presented to the ED due to generalized weakness with increased urinary frequency and urgency.  Patient on Xarelto PTA for Afib.  Pharmacy consulted to start heparin infusion.  Labs: hgb 11.6, hct 34.8, plt 162, INR pending, aPTT pending, HL pending  Goal of Therapy:  Heparin level 0.3-0.7 units/ml aPTT 66-102 seconds Monitor platelets by anticoagulation protocol: Yes   Plan:  Per consult start heparin drip when next dose of Xarelto would be due. No bolus will be ordered due to being on active anticoagulation Initiate heparin infusion at rate of 1200 units/hr, 04/19/22 @ 2300 Will order aPTT and HL for 8 hours after infusion is started Adjust based on aPTT until correlation with HL Daily CBC while on heparin  Lorin Picket,  PharmD 04/19/2022,8:45 AM

## 2022-04-19 NOTE — Progress Notes (Signed)
Progress Note   Patient: Gary Dawson [id number] DOB: [birth year] DOA: 04/18/2022     1 DOS: the patient was seen and examined on 04/19/2022   Brief hospital course: Mr. Khingston Oetken is a 82 year old male with history of permanent atrial fibrillation on Xarelto, cardiomyopathy, OSA on CPAP, history of hernia repair, history of tonsillectomy, who presents to the emergency department from urgent care center for chief concerns frequent falls and was sent to the ED for altered mental status.  Vitals in the emergency department showed temperature of 99.7, respiration rate 29, heart rate 98, blood pressure 129/104, SpO2 of 96% on room air.  Serum sodium is 137, potassium 4.1, chloride 102, bicarb 25, BUN of 27, serum creatinine 0.78, nonfasting blood glucose 112, EGFR greater than 60, WBC 8.7, hemoglobin 12.6, platelets of 166.  High since troponin is 19.  Lactic acid is 1.7.  CT of the head without contrast and CT cervical spine without contrast: Was read as no evidence of acute intracranial abnormality.  No acute cervical spine fracture.  Mild chronic small vessel ischemic disease and unchanged chronic ventriculomegaly.  CT abdomen pelvis with contrast: Was read as infrarenal saccular abdominal aortic aneurysm measuring up to 5.1 cm in maximum dimension.  Vascular surgical consultation is recommended.  Borderline prominence of gas and stool query mild constipation.  Moderate cardiomegaly with atherosclerotic calcification of the coronary arteries.  5 x 4 mm right middle lobe pulmonary nodule, no follow-up imaging of this nodule recommended.  Aortic and coronary atherosclerosis.  2.3 x 2.0 cm exophytic hypodense lesion in the right kidney and upper pole with internal density of 14 Hounsfield units.  No follow-up of this lesion is recommended.  CT L-spine: Was read as no acute osseous abnormality identified in the lumbar spine.  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  4/3: Vital stable.  Heart rate 80.  Cardiology and vascular surgery on board.  Patient had high risk aneurysm which need urgent intervention, will need a cardiac cath and goals of care discussion before that.  B12 elevated above 2500. Cardizem infusion was discontinued and patient was started on metoprolol. Echocardiogram with further decreased LVEF to 30 to 35%.  It was 45% on prior echo. Patient was started on heparin infusion while holding Xarelto and most likely will be getting his cardiac cath on Thursday afternoon or Friday morning. PT is recommending home health  Assessment and Plan: * Atrial fibrillation with rapid ventricular response  TSH level checked on 02/08/2022 and the value was 1.50 UA done was negative for leukocytes and nitrites and bacteria No clinical signs for infectious etiology at this time And s/p Cardizem infusion which has been no switched with metoprolol. Echocardiogram with worsening of EF so home Cardizem was discontinued. Home Xarelto is being converted with heparin for cardiac cath either on Thursday afternoon or Friday morning. Cardiology is on board-appreciate their help  Infrarenal abdominal aortic aneurysm (AAA) without rupture Infrarenal saccular abdominal aortic aneurysm measuring up to 5.1 cm in maximum dimension Vascular surgeon, Dr. Lucky Cowboy has been consulted and states the patient will be seen Vascular services was resting cardiology to be consulted for cardiology clearance Plan to fix AAA in the next few weeks  Weakness Generalized weakness Query deconditioning however given patient's age, B12 elevated Can be due to worsening cardiomyopathy. -PT/OT are recommending home health  Permanent atrial fibrillation -  Continue home metoprolol-will need titration of dose -Home Cardizem was discontinued by cardiology due to concern of worsening  cardiomyopathy -Home Xarelto is temporarily being converted with heparin  Cardiomyopathy Echocardiogram today with further decrease in his EF as compared to the prior done in 2022.  Apparently he also has an high risk stress test and never had left heart catheterization. -For cardiac catheterization in a day or so  OSA on CPAP CPAP nightly ordered  Hyperlipidemia -Continue Lipitor  Benign essential hypertension Blood pressure currently within goal. -Continue metoprolol -Home Cardizem was discontinued -Most likely he will be started on Entresto by cardiology  CVA (cerebral vascular accident) Patient has an history of CVA.  No acute concerns -Continue aspirin and Lipitor   Subjective: Patient was seen and examined today.  Stating that I am always in A-fib.  Feeling weak and having some balance issues for the past couple of months.  Denies any falls.  Physical Exam: Vitals:   04/19/22 0900 04/19/22 1002 04/19/22 1030 04/19/22 1209  BP: (!) 124/98 112/66 124/78 138/82  Pulse: (!) 107 (!) 130 (!) 111 98  Resp:  20 20 18   Temp:    98.1 F (36.7 C)  TempSrc:    Oral  SpO2: 95% 99% 95% 100%  Weight:    88 kg  Height:    6\' 1"  (1.854 m)   General.  Frail elderly man in no acute distress. Pulmonary.  Lungs clear bilaterally, normal respiratory effort. CV.  Irregularly irregular Abdomen.  Soft, nontender, nondistended, BS positive. CNS.  Alert and oriented .  No focal neurologic deficit. Extremities.  No edema, no cyanosis, pulses intact and symmetrical. Psychiatry.  Judgment and insight appears normal.   Data Reviewed: Prior data reviewed  Family Communication: Called brother and left a voicemail  Disposition: Status is: Inpatient Remains inpatient appropriate because: Severity of illness  Planned Discharge Destination: Home with Home Health  DVT prophylaxis.  Heparin Time spent: 50 minutes  This record has been created using Systems analyst. Errors  have been sought and corrected,but may not always be located. Such creation errors do not reflect on the standard of care.   Author: Lorella Nimrod, MD 04/19/2022 3:52 PM  For on call review [email or web address].

## 2022-04-19 NOTE — Evaluation (Signed)
Physical Therapy Evaluation Patient Details Name: Gary Dawson MRN: [id number] DOB: 1940-04-17 Today's Date: 04/19/2022  History of Present Illness  Pt is 82 y/o male with PMH including a-fib, CVA, OSA, weakness. Pt admitted with increasing weakness, back pain, two recent falls per medical record.  Clinical Impression  Patient received on stretcher in ED. Patient agreeable to PT assessment. Patient required min A to raise trunk to seated position. Patient requires min A for sit to stand and min guard for ambulation with RW ~100 feet. Patient requires cues for safety with AD. He will continue to benefit from skilled PT to improve functional independence and safety with mobility.        Recommendations for follow up therapy are one component of a multi-disciplinary discharge planning process, led by the attending physician.  Recommendations may be updated based on patient status, additional functional criteria and insurance authorization.  Follow Up Recommendations       Assistance Recommended at Discharge Intermittent Supervision/Assistance  Patient can return home with the following  A little help with walking and/or transfers;A little help with bathing/dressing/bathroom;Assist for transportation;Assistance with cooking/housework;Help with stairs or ramp for entrance    Equipment Recommendations Rollator (4 wheels)  Recommendations for Other Services       Functional Status Assessment Patient has had a recent decline in their functional status and demonstrates the ability to make significant improvements in function in a reasonable and predictable amount of time.     Precautions / Restrictions Precautions Precautions: Fall Restrictions Weight Bearing Restrictions: No      Mobility  Bed Mobility Overal bed mobility: Needs Assistance Bed Mobility: Supine to Sit     Supine to sit: Min assist     General bed mobility comments: with extra time to edge of stretcher     Transfers Overall transfer level: Needs assistance Equipment used: Rolling walker (2 wheels) Transfers: Sit to/from Stand Sit to Stand: Min guard                Ambulation/Gait Ambulation/Gait assistance: Min guard Gait Distance (Feet): 120 Feet Assistive device: Rolling walker (2 wheels) Gait Pattern/deviations: Step-through pattern, Decreased step length - right, Decreased step length - left, Decreased stride length, Shuffle Gait velocity: decreased     General Gait Details: Patient with decreased B foot clearance. Increased fall risk. Cues needed for safety with use of AD  Stairs            Wheelchair Mobility    Modified Rankin (Stroke Patients Only)       Balance Overall balance assessment: Needs assistance Sitting-balance support: Feet supported Sitting balance-Leahy Scale: Good     Standing balance support: Bilateral upper extremity supported, During functional activity, Reliant on assistive device for balance Standing balance-Leahy Scale: Fair Standing balance comment: reliant on RW                             Pertinent Vitals/Pain Pain Assessment Pain Assessment: No/denies pain    Home Living Family/patient expects to be discharged to:: Private residence Living Arrangements: Non-relatives/Friends Available Help at Discharge: Friend(s) Type of Home: House Home Access: Stairs to enter Entrance Stairs-Rails:  (unknown) Entrance Stairs-Number of Steps: 2 (garage) Alternate Level Stairs-Number of Steps: flight Home Layout: Two level Home Equipment: Cane - Holiday representative (2 wheels) Additional Comments: Pt states he goes in through the garage; says his downstairs bathroom is small.    Prior Function Prior Level of Function :  Driving;History of Falls (last six months);Independent/Modified Independent             Mobility Comments: Pt states he used to use quad cane sometimes and now will have to use it all the time. States  that he hasn't had "falls" but rather "trips" and sounds irritated that people keep saying he fell, although he does say he has caught himself with his hands. Medical record disagrees, stating pt has fallen on his back, and a fall in the lobby of urgent care. ADLs Comments: Pt states he is (I) with ADLs. States he has grab bars in shower and by the toilet. States that he drives. He says all his renter does for him is "lift heavy things if I need him to", although pt states that he does carry in his own groceries.     Hand Dominance   Dominant Hand: Right    Extremity/Trunk Assessment   Upper Extremity Assessment Upper Extremity Assessment: Defer to OT evaluation    Lower Extremity Assessment Lower Extremity Assessment: Generalized weakness    Cervical / Trunk Assessment Cervical / Trunk Assessment: Normal  Communication   Communication: Other (comment);No difficulties  Cognition Arousal/Alertness: Awake/alert Behavior During Therapy: WFL for tasks assessed/performed Overall Cognitive Status: No family/caregiver present to determine baseline cognitive functioning                                          General Comments      Exercises     Assessment/Plan    PT Assessment Patient needs continued PT services  PT Problem List Decreased strength;Decreased activity tolerance;Decreased balance;Decreased mobility;Decreased knowledge of use of DME;Decreased safety awareness;Decreased cognition;Decreased coordination       PT Treatment Interventions DME instruction;Gait training;Stair training;Functional mobility training;Therapeutic activities;Patient/family education;Therapeutic exercise;Balance training    PT Goals (Current goals can be found in the Care Plan section)  Acute Rehab PT Goals Patient Stated Goal: to return home PT Goal Formulation: With patient Time For Goal Achievement: 05/03/22 Potential to Achieve Goals: Good    Frequency Min 3X/week      Co-evaluation               AM-PAC PT "6 Clicks" Mobility  Outcome Measure Help needed turning from your back to your side while in a flat bed without using bedrails?: None Help needed moving from lying on your back to sitting on the side of a flat bed without using bedrails?: A Little Help needed moving to and from a bed to a chair (including a wheelchair)?: A Little Help needed standing up from a chair using your arms (e.g., wheelchair or bedside chair)?: A Little Help needed to walk in hospital room?: A Little Help needed climbing 3-5 steps with a railing? : A Lot 6 Click Score: 18    End of Session   Activity Tolerance: Patient tolerated treatment well Patient left: in chair;Other (comment) (patient left with transport to be moved to room) Nurse Communication: Mobility status PT Visit Diagnosis: Unsteadiness on feet (R26.81);Other abnormalities of gait and mobility (R26.89);Muscle weakness (generalized) (M62.81);Difficulty in walking, not elsewhere classified (R26.2)    Time: [id number] PT Time Calculation (min) (ACUTE ONLY): 15 min   Charges:   PT Evaluation $PT Eval Moderate Complexity: 1 Mod          Kody Vigil, PT, GCS 04/19/22,1:29 PM

## 2022-04-19 NOTE — Consult Note (Signed)
Hospital Consult    Reason for Consult:  AAA Saccular Aneurysm Requesting Physician:  Dr Duffy Bruce MD MRN #:  [id number]  History of Present Illness: This is a 82 y.o. male here with generalized weakness, falls, dysuria/frequency.  Patient endorses he has had multiple falls over the past weeks related to his legs giving out.  He states he has fallen 3 times in the last 48 hours.  He denies hitting his head or passing out.  He has a history of atrial fibrillation.  Today he denies any chest pain shortness of breath dizziness or blurred vision.  He came to [facility] emergency department after going to urgent care yesterday.   On exam today he is resting comfortably in the stretcher in the emergency department.  He has palpable pulses throughout.  He has positive reflexes to his lower extremities.  There are no open wounds ulcers or sores on his lower extremities.  Upon workup with CT scan of the abdomen and pelvis he was found to have a 5.1 cm aortic aneurysm.  Skin surgery was consulted to see the patient.  Past Medical History:  Diagnosis Date   Atypical chest pain    a. 03/2012 St echo: nl EF, no wma's.   CVA (cerebral vascular accident)    Depression    Dysrhythmia    Elevated PSA    Essential hypertension    Hyperlipidemia    MVP (mitral valve prolapse)    Persistent atrial fibrillation    a. CHA2DS2VASc = 4-->chronic Xarelto.   Psoriasis    Reflux    Sleep apnea    Vitamin B 12 deficiency     Past Surgical History:  Procedure Laterality Date   COLONOSCOPY WITH PROPOFOL     CYSTOSCOPY WITH LITHOLAPAXY N/A 11/01/2018   Procedure: CYSTOSCOPY WITH LITHOLAPAXY;  Surgeon: Billey Co, MD;  Location: [facility] ORS;  Service: Urology;  Laterality: N/A;   HERNIA REPAIR     HOLEP-LASER ENUCLEATION OF THE PROSTATE WITH MORCELLATION N/A 11/01/2018   Procedure: HOLEP-LASER ENUCLEATION OF THE PROSTATE WITH MORCELLATION;  Surgeon: Billey Co, MD;  Location: [facility] ORS;  Service:  Urology;  Laterality: N/A;   TONSILLECTOMY     VARICOSE VEIN SURGERY      Allergies  Allergen Reactions   Other Other (See Comments)    Trees and grasses     Prior to Admission medications   Medication Sig Start Date End Date Taking? Authorizing Provider  diltiazem (CARDIZEM CD) 120 MG 24 hr capsule Take 1 capsule (120 mg) by mouth once daily at night 02/24/22  Yes Gollan, Kathlene November, MD  hydroxypropyl methylcellulose / hypromellose (ISOPTO TEARS / GONIOVISC) 2.5 % ophthalmic solution Place 1 drop into both eyes 2 (two) times daily as needed for dry eyes.   Yes [provider]  metoprolol succinate (TOPROL-XL) 50 MG 24 hr tablet Take 1 tablet (50 mg total) by mouth daily. 02/24/22 02/19/23 Yes Gollan, Kathlene November, MD  mirabegron ER (MYRBETRIQ) 50 MG TB24 tablet Take 1 tablet (50 mg total) by mouth daily. 03/30/22  Yes McGowan, Larene Beach A, PA-C  traMADol (ULTRAM) 50 MG tablet Take 1 tablet (50 mg total) by mouth every 12 (twelve) hours as needed for moderate pain or severe pain. 04/15/22  Yes Cook, Jayce G, DO  VITAMIN D, ERGOCALCIFEROL, PO Take 1,000 Int'l Units by mouth daily.   Yes [provider]  XARELTO 20 MG TABS tablet TAKE 1 TABLET EVERY DAY WITH SUPPER 07/22/21  Yes Gollan,  Kathlene November, MD    Social History   Socioeconomic History   Marital status: Divorced    Spouse name: Not on file   Number of children: Not on file   Years of education: Not on file   Highest education level: Not on file  Occupational History   Not on file  Tobacco Use   Smoking status: Never   Smokeless tobacco: Never  Vaping Use   Vaping Use: Never used  Substance and Sexual Activity   Alcohol use: Yes    Alcohol/week: 1.0 standard drink of alcohol    Types: 1 Glasses of wine per week    Comment: social    Drug use: No   Sexual activity: Not Currently  Other Topics Concern   Not on file  Social History Narrative   Not on file   Social Determinants of Health   Financial Resource  Strain: Not on file  Food Insecurity: Not on file  Transportation Needs: Not on file  Physical Activity: Not on file  Stress: Not on file  Social Connections: Not on file  Intimate Partner Violence: Not on file     Family History  Problem Relation Age of Onset   Heart attack Father    Hypertension Father    Hyperlipidemia Father     ROS: Otherwise negative unless mentioned in HPI  Physical Examination  Vitals:   04/19/22 0830 04/19/22 0900  BP: (!) 127/97 (!) 124/98  Pulse:  (!) 107  Resp:    Temp:    SpO2: 96% 95%   There is no height or weight on file to calculate BMI.  General:  WDWN in NAD Gait: Not observed HENT: WNL, normocephalic Pulmonary: normal non-labored breathing, without Rales, rhonchi,  wheezing Cardiac: irregular, HX AFIB now in 120's, with Murmurs, rubs or gallops; without carotid bruits Abdomen: Positive bowel sounds, soft, NT/ND, no masses Skin: without rashes Vascular Exam/Pulses: Palpable pulses throughout.  Neuro lower extremities warm to touch. 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  Patient is unable to recall specific details as related to his current medical condition or past medical history.  Patient was alert and oriented x 1 and able to tell me his name and birthdate.  But he was unable to tell me what day it was or who the president [place] was.  Patient's affect is joyful.. Lymph:  Unremarkable  CBC    Component Value Date/Time   WBC 6.2 04/19/2022 0512   RBC 3.45 (L) 04/19/2022 0512   HGB 11.6 (L) 04/19/2022 0512   HCT 34.8 (L) 04/19/2022 0512   PLT 162 04/19/2022 0512   MCV 100.9 (H) 04/19/2022 0512   MCH 33.6 04/19/2022 0512   MCHC 33.3 04/19/2022 0512   RDW  13.1 04/19/2022 0512   LYMPHSABS 0.8 04/18/2022 1446   MONOABS 1.2 (H) 04/18/2022 1446   EOSABS 0.0 04/18/2022 1446   BASOSABS 0.0 04/18/2022 1446    BMET    Component Value Date/Time   NA 138 04/19/2022 0512   NA 139 05/17/2020 1455   K 3.7 04/19/2022 0512   CL 105 04/19/2022 0512   CO2 24 04/19/2022 0512   GLUCOSE 104 (H) 04/19/2022 0512   BUN 23 04/19/2022 0512   BUN 27 05/17/2020 1455  CREATININE 0.72 04/19/2022 0512   CALCIUM 8.4 (L) 04/19/2022 0512   GFRNONAA >60 04/19/2022 0512   GFRAA >60 09/19/2018 1453    COAGS: No results found for: "INR", "PROTIME"   Non-Invasive Vascular Imaging:    EXAM: CT ABDOMEN AND PELVIS WITH CONTRAST   IMPRESSION: 1. 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 Vascular surgical consultation is recommended. 2. Borderline prominence of gas and stool in the colon, query mild constipation. 3. Moderate cardiomegaly with atherosclerotic calcification of the coronary arteries. 4. Moderate degenerative hip arthropathy bilaterally. 5. 5 by 4 mm right middle lobe pulmonary nodule. No follow-up imaging of this nodule is recommended. 6. Aortic and coronary atherosclerosis. 7. 2.3 by 2.0 cm exophytic hypodense lesion of the right kidney upper pole with internal density of 14 Hounsfield units and relatively homogeneous density characteristics. No follow up of this lesion is recommended.  Statin:  No. Beta Blocker:  Yes.   Aspirin:  No. ACEI:  No. ARB:  No. CCB use:  Yes Other antiplatelets/anticoagulants:  Yes.   Xarelto 20 mg Daily   ASSESSMENT/PLAN: This is a 82 y.o. male who presents to [facility] emergency department for generalized weakness and falls.  Upon workup he was found to have on CT scan of the abdomen and pelvis a 5.1 cm aortic aneurysm.  Patient will need to have this treated in the near future.  Cardiology was then consulted to see the patient prior to this procedure.   Cardiology notes that the patient was to undergo a cardiac catheterization 2 years ago but was never followed up on.  Therefore the patient will need cardiac catheterization prior to any elective procedure.  Also brought into question is the patient's current level of dementia.  A family meeting and discussion would be best to discuss the patient's current condition and treatment plans going forward.  Vascular surgery will continue to follow and postcardiac catheterization with a family discussion a procedure will be scheduled to treat patient's AAA if family and patient agree.  I discussed the plan with Dr. Leotis Pain MD   -I discussed in detail the plan with Dr. Leotis Pain MD and he is in agreement with the plan.   Drema Pry [facility] 04/19/2022 9:46 AM

## 2022-04-20 ENCOUNTER — Other Ambulatory Visit (HOSPITAL_COMMUNITY): Payer: Self-pay

## 2022-04-20 ENCOUNTER — Encounter
Admission: EM | Disposition: A | Discharge status: Skilled Nursing Facility | Payer: Self-pay | Source: Home / Self Care | Attending: Internal Medicine

## 2022-04-20 DIAGNOSIS — I4891 Unspecified atrial fibrillation: Secondary | ICD-10-CM | POA: Diagnosis not present

## 2022-04-20 DIAGNOSIS — I4821 Permanent atrial fibrillation: Secondary | ICD-10-CM | POA: Diagnosis not present

## 2022-04-20 DIAGNOSIS — I5023 Acute on chronic systolic (congestive) heart failure: Secondary | ICD-10-CM

## 2022-04-20 DIAGNOSIS — R531 Weakness: Secondary | ICD-10-CM | POA: Diagnosis not present

## 2022-04-20 DIAGNOSIS — I714 Abdominal aortic aneurysm, without rupture, unspecified: Secondary | ICD-10-CM

## 2022-04-20 DIAGNOSIS — I7143 Infrarenal abdominal aortic aneurysm, without rupture: Secondary | ICD-10-CM | POA: Diagnosis not present

## 2022-04-20 HISTORY — PX: RIGHT/LEFT HEART CATH AND CORONARY ANGIOGRAPHY: CATH118266

## 2022-04-20 LAB — CBC
HCT: 35.8 % — ABNORMAL LOW (ref 39.0–52.0)
Hemoglobin: 12.1 g/dL — ABNORMAL LOW (ref 13.0–17.0)
MCH: 33.8 pg (ref 26.0–34.0)
MCHC: 33.8 g/dL (ref 30.0–36.0)
MCV: 100 fL (ref 80.0–100.0)
Platelets: 168 10*3/uL (ref 150–400)
RBC: 3.58 MIL/uL — ABNORMAL LOW (ref 4.22–5.81)
RDW: 13.1 % (ref 11.5–15.5)
WBC: 5.6 10*3/uL (ref 4.0–10.5)
nRBC: 0 % (ref 0.0–0.2)

## 2022-04-20 LAB — POCT I-STAT EG7
Acid-Base Excess: 0 mmol/L (ref 0.0–2.0)
Bicarbonate: 24.1 mmol/L (ref 20.0–28.0)
Calcium, Ion: 1.23 mmol/L (ref 1.15–1.40)
HCT: 34 % — ABNORMAL LOW (ref 39.0–52.0)
Hemoglobin: 11.6 g/dL — ABNORMAL LOW (ref 13.0–17.0)
O2 Saturation: 59 %
Potassium: 4.1 mmol/L (ref 3.5–5.1)
Sodium: 138 mmol/L (ref 135–145)
TCO2: 25 mmol/L (ref 22–32)
pCO2, Ven: 36.4 mmHg — ABNORMAL LOW (ref 44–60)
pH, Ven: 7.429 (ref 7.25–7.43)
pO2, Ven: 30 mmHg — CL (ref 32–45)

## 2022-04-20 LAB — POCT I-STAT 7, (LYTES, BLD GAS, ICA,H+H)
Acid-base deficit: 1 mmol/L (ref 0.0–2.0)
Bicarbonate: 22.1 mmol/L (ref 20.0–28.0)
Calcium, Ion: 1.21 mmol/L (ref 1.15–1.40)
HCT: 34 % — ABNORMAL LOW (ref 39.0–52.0)
Hemoglobin: 11.6 g/dL — ABNORMAL LOW (ref 13.0–17.0)
O2 Saturation: 95 %
Potassium: 4.1 mmol/L (ref 3.5–5.1)
Sodium: 137 mmol/L (ref 135–145)
TCO2: 23 mmol/L (ref 22–32)
pCO2 arterial: 31.2 mmHg — ABNORMAL LOW (ref 32–48)
pH, Arterial: 7.457 — ABNORMAL HIGH (ref 7.35–7.45)
pO2, Arterial: 71 mmHg — ABNORMAL LOW (ref 83–108)

## 2022-04-20 LAB — APTT: aPTT: 69 seconds — ABNORMAL HIGH (ref 24–36)

## 2022-04-20 LAB — HEPARIN LEVEL (UNFRACTIONATED): Heparin Unfractionated: >1.1 IU/mL — ABNORMAL HIGH (ref 0.30–0.70)

## 2022-04-20 SURGERY — RIGHT/LEFT HEART CATH AND CORONARY ANGIOGRAPHY
Anesthesia: Moderate Sedation

## 2022-04-20 MED ORDER — MIDAZOLAM HCL 2 MG/2ML IJ SOLN
INTRAMUSCULAR | Status: DC | PRN
  Administered 2022-04-20: 1 mg via INTRAVENOUS

## 2022-04-20 MED ORDER — HEPARIN (PORCINE) IN NACL 1000-0.9 UT/500ML-% IV SOLN
INTRAVENOUS | Status: DC | PRN
  Administered 2022-04-20: 1000 mL

## 2022-04-20 MED ORDER — IOHEXOL 300 MG/ML  SOLN
INTRAMUSCULAR | Status: DC | PRN
  Administered 2022-04-20: 55 mL

## 2022-04-20 MED ORDER — SODIUM CHLORIDE 0.9 % IV SOLN: INTRAVENOUS | Status: DC

## 2022-04-20 MED ORDER — SODIUM CHLORIDE 0.9% FLUSH: 3.0000 mL | Freq: Two times a day (BID) | INTRAVENOUS | Status: DC

## 2022-04-20 MED ORDER — HEPARIN SODIUM (PORCINE) 1000 UNIT/ML IJ SOLN
INTRAMUSCULAR | Status: DC | PRN
  Administered 2022-04-20: 4000 [IU] via INTRAVENOUS

## 2022-04-20 MED ORDER — HEPARIN (PORCINE) IN NACL 1000-0.9 UT/500ML-% IV SOLN
INTRAVENOUS | Status: AC
  Filled 2022-04-20: qty 1000

## 2022-04-20 MED ORDER — HEPARIN SODIUM (PORCINE) 1000 UNIT/ML IJ SOLN
INTRAMUSCULAR | Status: AC
  Filled 2022-04-20: qty 10

## 2022-04-20 MED ORDER — SODIUM CHLORIDE 0.9% FLUSH
3.0000 mL | Freq: Two times a day (BID) | INTRAVENOUS | Status: DC
  Administered 2022-04-20 – 2022-04-26 (×12): 3 mL via INTRAVENOUS

## 2022-04-20 MED ORDER — HEPARIN (PORCINE) 25000 UT/250ML-% IV SOLN
1200.0000 [IU]/h | INTRAVENOUS | Status: DC
  Administered 2022-04-20 (×2): 1200 [IU]/h via INTRAVENOUS
  Filled 2022-04-20: qty 250

## 2022-04-20 MED ORDER — SODIUM CHLORIDE 0.9% FLUSH: 3.0000 mL | INTRAVENOUS | Status: DC | PRN

## 2022-04-20 MED ORDER — MIDAZOLAM HCL 2 MG/2ML IJ SOLN
INTRAMUSCULAR | Status: AC
  Filled 2022-04-20: qty 2

## 2022-04-20 MED ORDER — FENTANYL CITRATE (PF) 100 MCG/2ML IJ SOLN
INTRAMUSCULAR | Status: AC
  Filled 2022-04-20: qty 2

## 2022-04-20 MED ORDER — SODIUM CHLORIDE 0.9 % IV SOLN: 250.0000 mL | INTRAVENOUS | Status: DC | PRN

## 2022-04-20 MED ORDER — ASPIRIN 81 MG PO CHEW: 81.0000 mg | CHEWABLE_TABLET | ORAL | Status: DC

## 2022-04-20 MED ORDER — VERAPAMIL HCL 2.5 MG/ML IV SOLN
INTRAVENOUS | Status: DC | PRN
  Administered 2022-04-20: 2.5 mg via INTRA_ARTERIAL

## 2022-04-20 MED ORDER — VERAPAMIL HCL 2.5 MG/ML IV SOLN
INTRAVENOUS | Status: AC
  Filled 2022-04-20: qty 2

## 2022-04-20 MED ORDER — METOPROLOL SUCCINATE ER 100 MG PO TB24
100.0000 mg | ORAL_TABLET | Freq: Two times a day (BID) | ORAL | Status: DC
  Administered 2022-04-20: 100 mg via ORAL
  Filled 2022-04-20 (×2): qty 1

## 2022-04-20 SURGICAL SUPPLY — 14 items
CATH BALLN WEDGE 5F 110CM (CATHETERS) IMPLANT
CATH INFINITI 5FR JK (CATHETERS) IMPLANT
CATH INFINITI 5FR JL4 (CATHETERS) IMPLANT
DEVICE RAD TR BAND REGULAR (VASCULAR PRODUCTS) IMPLANT
DRAPE BRACHIAL (DRAPES) IMPLANT
GLIDESHEATH SLEND SS 6F .021 (SHEATH) IMPLANT
GUIDEWIRE .025 260CM (WIRE) IMPLANT
GUIDEWIRE INQWIRE 1.5J.035X260 (WIRE) IMPLANT
INQWIRE 1.5J .035X260CM (WIRE) ×1
PACK CARDIAC CATH (CUSTOM PROCEDURE TRAY) ×1 IMPLANT
PROTECTION STATION PRESSURIZED (MISCELLANEOUS) ×1
SET ATX-X65L (MISCELLANEOUS) IMPLANT
SHEATH GLIDE SLENDER 4/5FR (SHEATH) IMPLANT
STATION PROTECTION PRESSURIZED (MISCELLANEOUS) IMPLANT

## 2022-04-20 NOTE — Consult Note (Signed)
ANTICOAGULATION CONSULT NOTE Pharmacy Consult for heparin infusion Indication: atrial fibrillation  Allergies  Allergen Reactions   Other Other (See Comments)    Trees and grasses     Patient Measurements: Height: 6\' 1"  (185.4 cm) Weight: 87.7 kg (193 lb 5.5 oz) IBW/kg (Calculated) : 79.9 Heparin Dosing Weight: 88 kg  Vital Signs: Temp: 97.5 F (36.4 C) (04/04 1312) Temp Source: Oral (04/04 1312) BP: 154/78 (04/04 1515) Pulse Rate: 112 (04/04 1515)  Labs: Recent Labs    04/18/22 1446 04/19/22 0512 04/19/22 1008 04/19/22 1408 04/20/22 0711 04/20/22 1414 04/20/22 1417  HGB 12.6* 11.6*  --   --  12.1* 11.6* 11.6*  HCT 37.4* 34.8*  --   --  35.8* 34.0* 34.0*  PLT 166 162  --   --  168  --   --   APTT  --   --  45*  --  69*  --   --   LABPROT  --   --  30.1*  --   --   --   --   INR  --   --  2.9*  --   --   --   --   HEPARINUNFRC  --   --  >1.10*  --  >1.10*  --   --   CREATININE 0.78 0.72  --   --   --   --   --   TROPONINIHS 19*  --   --  16  --   --   --      Estimated Creatinine Clearance: 83.2 mL/min (by C-G formula based on SCr of 0.72 mg/dL).   Medical History: Past Medical History:  Diagnosis Date   Atypical chest pain    a. 03/2012 St echo: nl EF, no wma's.   CVA (cerebral vascular accident)    Depression    Dysrhythmia    Elevated PSA    Essential hypertension    Hyperlipidemia    MVP (mitral valve prolapse)    Persistent atrial fibrillation    a. CHA2DS2VASc = 4-->chronic Xarelto.   Psoriasis    Reflux    Sleep apnea    Vitamin B 12 deficiency     Medications:  Xarelto 20 mg po daily PTA for Afib, last dose 04/19/22 @ 0025  Assessment: 82 yo male presented to the ED due to generalized weakness with increased urinary frequency and urgency.  Patient on Xarelto PTA for Afib.  Pharmacy consulted to start heparin infusion while holding rivaroxaban due to plan cardiac cath .CBC stable. IV heparin was stopped earlier in the day for PCI   Goal  of Therapy:  Heparin level 0.3-0.7 units/ml once heparin level and aPTT correlate.  aPTT 66-102 seconds Monitor platelets by anticoagulation protocol: Yes   Plan:  ---we have an order from cardiology to restart heparin infusion at 1800 ---restart heparin infusion at 1200 units/hr at 1800  ---Recheck aPTT in 8 hours after restarting. ---Order heparin level and CBC with AM labs.  ---Once heparin level is correlating with aPTT, switch to heparin level monitoring.  ---daily CBC while on IV heparin  Dallie Piles, PharmD 04/20/2022,3:18 PM

## 2022-04-20 NOTE — Progress Notes (Signed)
OT Cancellation Note  Patient Details Name: Gary Dawson MRN: [id number] DOB: [birth year]   Cancelled Treatment:    Reason Eval/Treat Not Completed: Patient declined, no reason specified;Medical issues which prohibited therapy. OT attempted to see pt. He is awaiting cardiac procedure this afternoon. States he has been getting out of bed without issue. HR noted to be 112-122 at rest while semi-reclined in bed. Will attempt to see pt tomorrow after procedure this afternoon.  Vania Rea 04/20/2022, 12:18 PM

## 2022-04-20 NOTE — Assessment & Plan Note (Addendum)
TSH level checked on 02/08/2022 and the value was 1.50 UA done was negative for leukocytes and nitrites and bacteria No clinical signs for infectious etiology at this time S/P Cardizem infusion which has been no switched with metoprolol. Echocardiogram with worsening of EF so home Cardizem was discontinued. Heart rate now well-controlled with higher doses of metoprolol and addition of digoxin. -Continue to monitor -Heparin now switched to Xarelto

## 2022-04-20 NOTE — Assessment & Plan Note (Addendum)
Echocardiogram today with further decrease in his EF as compared to the prior done in 2022.  Apparently he also has an high risk stress test and never had left heart catheterization. 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  Concern of secondary to tachycardia. -Cardiology added Lasix -Low-dose losartan was added -consideration to switching to [facility] as outpatient -Continue Marcelline Deist

## 2022-04-20 NOTE — Progress Notes (Deleted)
SATURATION QUALIFICATIONS: (This note is used to comply with regulatory documentation for home oxygen)  Patient Saturations on Room Air at Rest = 97%  Patient Saturations on Room Air while Ambulating = 87%  Patient Saturations on 2 Liters of oxygen while Ambulating = 92%  Please briefly explain why patient needs home oxygen: needs 02 2L with ambulation or exertion

## 2022-04-20 NOTE — Progress Notes (Signed)
Progress Note   Patient: Gary Dawson [id number] DOB: 05-31-1940 DOA: 04/18/2022     2 DOS: the patient was seen and examined on 04/20/2022   Brief hospital course: Gary Dawson is a 82 year old male with history of permanent atrial fibrillation on Xarelto, cardiomyopathy, OSA on CPAP, history of hernia repair, history of tonsillectomy, who presents to the emergency department from urgent care center for chief concerns frequent falls and was sent to the ED for altered mental status.  Vitals in the emergency department showed temperature of 99.7, respiration rate 29, heart rate 98, blood pressure 129/104, SpO2 of 96% on room air.  Serum sodium is 137, potassium 4.1, chloride 102, bicarb 25, BUN of 27, serum creatinine 0.78, nonfasting blood glucose 112, EGFR greater than 60, WBC 8.7, hemoglobin 12.6, platelets of 166.  High since troponin is 19.  Lactic acid is 1.7.  CT of the head without contrast and CT cervical spine without contrast: Was read as no evidence of acute intracranial abnormality.  No acute cervical spine fracture.  Mild chronic small vessel ischemic disease and unchanged chronic ventriculomegaly.  CT abdomen pelvis with contrast: Was read as infrarenal saccular abdominal aortic aneurysm measuring up to 5.1 cm in maximum dimension.  Vascular surgical consultation is recommended.  Borderline prominence of gas and stool query mild constipation.  Moderate cardiomegaly with atherosclerotic calcification of the coronary arteries.  5 x 4 mm right middle lobe pulmonary nodule, no follow-up imaging of this nodule recommended.  Aortic and coronary atherosclerosis.  2.3 x 2.0 cm exophytic hypodense lesion in the right kidney and upper pole with internal density of 14 Hounsfield units.  No follow-up of this lesion is recommended.  CT L-spine: Was read as no acute osseous abnormality identified in the lumbar spine.  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  4/3: Vital stable.  Heart rate 80.  Cardiology and vascular surgery on board.  Patient had high risk aneurysm which need urgent intervention, will need a cardiac cath and goals of care discussion before that.  B12 elevated above 2500. Cardizem infusion was discontinued and patient was started on metoprolol. Echocardiogram with further decreased LVEF to 30 to 35%.  It was 45% on prior echo. Patient was started on heparin infusion while holding Xarelto and most likely will be getting his cardiac cath on Thursday afternoon or Friday morning. PT is recommending home health  4/4: Hemodynamically stable with well-controlled heart rate this morning.  Awaiting cardiac catheterization.  Assessment and Plan: * Atrial fibrillation with rapid ventricular response  TSH level checked on 02/08/2022 and the value was 1.50 UA done was negative for leukocytes and nitrites and bacteria No clinical signs for infectious etiology at this time S/P Cardizem infusion which has been no switched with metoprolol. Echocardiogram with worsening of EF so home Cardizem was discontinued. Home Xarelto is being converted with heparin for cardiac cath today Cardiology is on board-appreciate their help  Infrarenal abdominal aortic aneurysm (AAA) without rupture Infrarenal saccular abdominal aortic aneurysm measuring up to 5.1 cm in maximum dimension Vascular surgeon, Dr. Lucky Cowboy has been consulted and states the patient will be seen Vascular services was resting cardiology to be consulted for cardiology clearance Plan to fix AAA in the next few weeks  Weakness Generalized weakness Query deconditioning however given patient's age, B12 elevated Can be due to worsening cardiomyopathy. -PT/OT are  recommending home health  Permanent atrial fibrillation -Continue home metoprolol-will need titration of dose -Home  Cardizem was discontinued by cardiology due to concern of worsening cardiomyopathy -Home Xarelto is temporarily being converted with heparin  Cardiomyopathy Echocardiogram today with further decrease in his EF as compared to the prior done in 2022.  Apparently he also has an high risk stress test and never had left heart catheterization. -For cardiac catheterization today  OSA on CPAP CPAP nightly ordered  Hyperlipidemia -Continue Lipitor  Benign essential hypertension Blood pressure currently within goal. -Continue metoprolol -Home Cardizem was discontinued -Most likely he will be started on Entresto by cardiology  CVA (cerebral vascular accident) Patient has an history of CVA.  No acute concerns -Continue aspirin and Lipitor   Subjective: Patient was seen and examined today.  No new complaint waiting for his cardiac cath.  Physical Exam: Vitals:   04/20/22 1312 04/20/22 1450 04/20/22 1500 04/20/22 1515  BP: 137/79 (!) 151/89 (!) 148/86 (!) 154/78  Pulse: (!) 115 (!) 112 (!) 114 (!) 112  Resp: 19 18 18 14   Temp: (!) 97.5 F (36.4 C)     TempSrc: Oral     SpO2: 97% 93% 96% 95%  Weight:      Height:       General.  Well-developed elderly man, in no acute distress. Pulmonary.  Lungs clear bilaterally, normal respiratory effort. CV.  Regular rate and rhythm, no JVD, rub or murmur. Abdomen.  Soft, nontender, nondistended, BS positive. CNS.  Alert and oriented .  No focal neurologic deficit. Extremities.  No edema, no cyanosis, pulses intact and symmetrical. Psychiatry.  Judgment and insight appears normal.    Data Reviewed: Prior data reviewed  Family Communication: Speak with brother and his wife on phone  Disposition: Status is: Inpatient Remains inpatient appropriate because: Severity of illness  Planned Discharge Destination: Home with Home Health  DVT prophylaxis.  Heparin Time spent: 45 minutes  This record has been created using TEFL teacher. Errors have been sought and corrected,but may not always be located. Such creation errors do not reflect on the standard of care.   Author: Lorella Nimrod, MD 04/20/2022 3:28 PM  For on call review [email or web address].

## 2022-04-20 NOTE — Assessment & Plan Note (Addendum)
Generalized weakness Query deconditioning however given patient's age, B12 elevated Can be due to worsening cardiomyopathy. -PT/OT are recommending -SNF as he lives alone and will require supervision

## 2022-04-20 NOTE — Progress Notes (Signed)
PT Cancellation Note  Patient Details Name: Watson Gelb MRN: [id number] DOB: 07/02/1940   Cancelled Treatment:    Reason Eval/Treat Not Completed: Patient at procedure or test/unavailable.  Pt currently off floor at procedure.  Will re-attempt PT session at a later date/time.  Leitha Bleak, PT 04/20/22, 1:21 PM

## 2022-04-20 NOTE — Consult Note (Signed)
ANTICOAGULATION CONSULT NOTE Pharmacy Consult for heparin infusion Indication: atrial fibrillation  Allergies  Allergen Reactions   Other Other (See Comments)    Trees and grasses     Patient Measurements: Height: 6\' 1"  (185.4 cm) Weight: 87.7 kg (193 lb 5.5 oz) IBW/kg (Calculated) : 79.9 Heparin Dosing Weight: 88 kg  Vital Signs: Temp: 97.8 F (36.6 C) (04/04 0736) BP: 120/101 (04/04 0736) Pulse Rate: 67 (04/04 0736)  Labs: Recent Labs    04/18/22 1446 04/19/22 0512 04/19/22 1008 04/19/22 1408 04/20/22 0711  HGB 12.6* 11.6*  --   --   --   HCT 37.4* 34.8*  --   --   --   PLT 166 162  --   --   --   APTT  --   --  45*  --  69*  LABPROT  --   --  30.1*  --   --   INR  --   --  2.9*  --   --   HEPARINUNFRC  --   --  >1.10*  --  >1.10*  CREATININE 0.78 0.72  --   --   --   TROPONINIHS 19*  --   --  16  --      Estimated Creatinine Clearance: 83.2 mL/min (by C-G formula based on SCr of 0.72 mg/dL).   Medical History: Past Medical History:  Diagnosis Date   Atypical chest pain    a. 03/2012 St echo: nl EF, no wma's.   CVA (cerebral vascular accident)    Depression    Dysrhythmia    Elevated PSA    Essential hypertension    Hyperlipidemia    MVP (mitral valve prolapse)    Persistent atrial fibrillation    a. CHA2DS2VASc = 4-->chronic Xarelto.   Psoriasis    Reflux    Sleep apnea    Vitamin B 12 deficiency     Medications:  Xarelto 20 mg po daily PTA for Afib, last dose 04/19/22 @ 0025  Assessment: 82 yo male presented to the ED due to generalized weakness with increased urinary frequency and urgency.  Patient on Xarelto PTA for Afib.  Pharmacy consulted to start heparin infusion while holding rivaroxaban due to plan cardiac cath .CBC stable.   4/4 0711 aPTT 69. HL > 1.1   Goal of Therapy:  Heparin level 0.3-0.7 units/ml once heparin level and aPTT correlate.  aPTT 66-102 seconds Monitor platelets by anticoagulation protocol: Yes   Plan:  aPTT is  therapeutic. Will continue heparin infusion at 1200 units/hr. Recheck aPTT in 8 hours. Order heparin level and CBC with AM labs. Once heparin level is correlating with aPTT, switch to heparin level monitoring.    Oswald Hillock, PharmD 04/20/2022,8:16 AM

## 2022-04-20 NOTE — Progress Notes (Signed)
Rounding Note    Patient Name: Gary Dawson Date of Encounter: 04/20/2022  [facility] Cardiologist: Ida Rogue, MD   Subjective   He continues to be in atrial fibrillation with rapid ventricular response.  He denies chest pain but has chronic exertional dyspnea.  Inpatient Medications    Scheduled Meds:  aspirin EC  81 mg Oral Daily   atorvastatin  40 mg Oral Daily   lidocaine  2 patch Transdermal Q24H   metoprolol succinate  50 mg Oral BID   Continuous Infusions:  heparin 1,200 Units/hr (04/19/22 2259)   PRN Meds: acetaminophen **OR** acetaminophen, ondansetron **OR** ondansetron (ZOFRAN) IV, polyvinyl alcohol, senna-docusate, traMADol   Vital Signs    Vitals:   04/19/22 2306 04/20/22 0454 04/20/22 0454 04/20/22 0736  BP: (!) 148/95  (!) 133/90 (!) 120/101  Pulse: 68  99 67  Resp: 18  18 20   Temp: 98.4 F (36.9 C)  (!) 97.5 F (36.4 C) 97.8 F (36.6 C)  TempSrc:      SpO2: 98%  97% 99%  Weight:  87.7 kg    Height:        Intake/Output Summary (Last 24 hours) at 04/20/2022 0946 Last data filed at 04/20/2022 0900 Gross per 24 hour  Intake 600 ml  Output 900 ml  Net -300 ml      04/20/2022    4:54 AM 04/19/2022   12:09 PM 04/18/2022    1:45 PM  Last 3 Weights  Weight (lbs) 193 lb 5.5 oz 194 lb 0.1 oz 194 lb 0.1 oz  Weight (kg) 87.7 kg 88 kg 88 kg      Telemetry    Atrial fibrillation with rapid ventricular response with a heart rate ranging from 100 to 135 bpm- Personally Reviewed  ECG     - Personally Reviewed  Physical Exam   GEN: No acute distress.   Neck: No JVD Cardiac: Irregularly irregular and tachycardic, no murmurs, rubs, or gallops.  Respiratory: Clear to auscultation bilaterally. GI: Soft, nontender, non-distended  MS: No edema; No deformity. Neuro:  Nonfocal  Psych: Normal affect   Labs    High Sensitivity Troponin:   Recent Labs  Lab 04/18/22 1446 04/19/22 1408  TROPONINIHS 19* 16      Chemistry Recent Labs  Lab 04/18/22 1446 04/19/22 0512  NA 137 138  K 4.1 3.7  CL 102 105  CO2 25 24  GLUCOSE 112* 104*  BUN 27* 23  CREATININE 0.78 0.72  CALCIUM 8.9 8.4*  MG  --  2.0  PROT 7.8  --   ALBUMIN 3.8  --   AST 63*  --   ALT 34  --   ALKPHOS 56  --   BILITOT 1.8*  --   GFRNONAA >60 >60  ANIONGAP 10 9    Lipids No results for input(s): "CHOL", "TRIG", "HDL", "LABVLDL", "LDLCALC", "CHOLHDL" in the last 168 hours.  Hematology Recent Labs  Lab 04/18/22 1446 04/19/22 0512 04/20/22 0711  WBC 8.7 6.2 5.6  RBC 3.73* 3.45* 3.58*  HGB 12.6* 11.6* 12.1*  HCT 37.4* 34.8* 35.8*  MCV 100.3* 100.9* 100.0  MCH 33.8 33.6 33.8  MCHC 33.7 33.3 33.8  RDW 13.2 13.1 13.1  PLT 166 162 168   Thyroid No results for input(s): "TSH", "FREET4" in the last 168 hours.  BNPNo results for input(s): "BNP", "PROBNP" in the last 168 hours.  DDimer No results for input(s): "DDIMER" in the last 168 hours.   Radiology  ECHOCARDIOGRAM COMPLETE  Result Date: 04/19/2022    ECHOCARDIOGRAM REPORT   Patient Name:   Gary Dawson [facility] Date of Exam: 04/19/2022 Medical Rec #:  [id number]              Height:       73.0 in Accession #:    [id number]             Weight:       194.0 lb Date of Birth:  10/15/1940              BSA:          2.124 m Patient Age:    82 years               BP:           132/87 mmHg Patient Gender: M                      HR:           88 bpm. Exam Location:  [facility] Procedure: 2D Echo, Cardiac Doppler and Color Doppler Indications:     Atrial Fibrillation I48.91  History:         Patient has prior history of Echocardiogram examinations, most                  recent 05/19/2020. Mitral Valve Prolapse; Risk                  Factors:Hypertension. Persistent Afib.  Sonographer:     Sherrie Sport Referring Phys:  [id number] AMY N COX Diagnosing Phys: Kate Sable MD IMPRESSIONS  1. Left ventricular ejection fraction, by estimation, is 30 to 35%. The left ventricle has moderate to  severely decreased function. The left ventricle demonstrates global hypokinesis. There is mild left ventricular hypertrophy. Left ventricular diastolic parameters are indeterminate.  2. Right ventricular systolic function is mildly reduced. The right ventricular size is moderately enlarged. There is mildly elevated pulmonary artery systolic pressure.  3. Right atrial size was severely dilated.  4. The mitral valve is normal in structure. Mild mitral valve regurgitation.  5. Tricuspid valve regurgitation is moderate.  6. The aortic valve is tricuspid. Aortic valve regurgitation is mild. Aortic valve sclerosis/calcification is present, without any evidence of aortic stenosis.  7. Aortic dilatation noted. There is mild dilatation of the aortic root, measuring 40 mm.  8. The inferior vena cava is normal in size with <50% respiratory variability, suggesting right atrial pressure of 8 mmHg. FINDINGS  Left Ventricle: Left ventricular ejection fraction, by estimation, is 30 to 35%. The left ventricle has moderate to severely decreased function. The left ventricle demonstrates global hypokinesis. The left ventricular internal cavity size was normal in size. There is mild left ventricular hypertrophy. Left ventricular diastolic parameters are indeterminate. Right Ventricle: The right ventricular size is moderately enlarged. No increase in right ventricular wall thickness. Right ventricular systolic function is mildly reduced. There is mildly elevated pulmonary artery systolic pressure. The tricuspid regurgitant velocity is 2.84 m/s, and with an assumed right atrial pressure of 8 mmHg, the estimated right ventricular systolic pressure is 0000000 mmHg. Left Atrium: Left atrial size was normal in size. Right Atrium: Right atrial size was severely dilated. Pericardium: There is no evidence of pericardial effusion. Mitral Valve: The mitral valve is normal in structure. Mild mitral valve regurgitation. Tricuspid Valve: The tricuspid  valve is normal in structure. Tricuspid valve regurgitation is moderate. Aortic Valve: The aortic valve is  tricuspid. Aortic valve regurgitation is mild. Aortic regurgitation PHT measures 595 msec. Aortic valve sclerosis/calcification is present, without any evidence of aortic stenosis. Aortic valve mean gradient measures 2.0  mmHg. Aortic valve peak gradient measures 3.1 mmHg. Aortic valve area, by VTI measures 3.35 cm. Pulmonic Valve: The pulmonic valve was normal in structure. Pulmonic valve regurgitation is mild. Aorta: Aortic dilatation noted. There is mild dilatation of the aortic root, measuring 40 mm. Venous: The inferior vena cava is normal in size with less than 50% respiratory variability, suggesting right atrial pressure of 8 mmHg. IAS/Shunts: No atrial level shunt detected by color flow Doppler.  LEFT VENTRICLE PLAX 2D LVIDd:         5.30 cm LVIDs:         4.90 cm LV PW:         1.10 cm LV IVS:        1.20 cm LVOT diam:     2.30 cm LV SV:         52 LV SV Index:   24 LVOT Area:     4.15 cm  LV Volumes (MOD) LV vol d, MOD A2C: 99.4 ml LV vol d, MOD A4C: 109.2 ml LV vol s, MOD A2C: 55.0 ml LV vol s, MOD A4C: 72.2 ml LV SV MOD A2C:     44.4 ml LV SV MOD A4C:     109.2 ml LV SV MOD BP:      43.1 ml RIGHT VENTRICLE RV Basal diam:  5.60 cm RV Mid diam:    3.20 cm RV S prime:     7.83 cm/s TAPSE (M-mode): 1.1 cm LEFT ATRIUM             Index        RIGHT ATRIUM           Index LA diam:        4.50 cm 2.12 cm/m   RA Area:     36.40 cm LA Vol (A2C):   45.3 ml 21.33 ml/m  RA Volume:   151.00 ml 71.09 ml/m LA Vol (A4C):   47.5 ml 22.36 ml/m LA Biplane Vol: 50.7 ml 23.87 ml/m  AORTIC VALVE AV Area (Vmax):    3.35 cm AV Area (Vmean):   3.27 cm AV Area (VTI):     3.35 cm AV Vmax:           88.15 cm/s AV Vmean:          59.250 cm/s AV VTI:            0.155 m AV Peak Grad:      3.1 mmHg AV Mean Grad:      2.0 mmHg LVOT Vmax:         71.10 cm/s LVOT Vmean:        46.600 cm/s LVOT VTI:          0.125 m LVOT/AV  VTI ratio: 0.81 AI PHT:            595 msec  AORTA Ao Root diam: 4.07 cm MITRAL VALVE               TRICUSPID VALVE MV Area (PHT): 6.43 cm    TR Peak grad:   32.3 mmHg MV Decel Time: 118 msec    TR Vmax:        284.00 cm/s MV E velocity: 76.50 cm/s  SHUNTS                            Systemic VTI:  0.12 m                            Systemic Diam: 2.30 cm Kate Sable MD Electronically signed by Kate Sable MD Signature Date/Time: 04/19/2022/1:45:20 PM    Final    DG Chest Port 1 View  Result Date: 04/18/2022 CLINICAL DATA:  Confusion, weakness EXAM: PORTABLE CHEST 1 VIEW COMPARISON:  09/29/2019, 04/18/2022 FINDINGS: Single frontal view of the chest demonstrates stable enlargement of the cardiac silhouette. No acute airspace disease, effusion, or pneumothorax. No acute bony abnormality. IMPRESSION: 1. Stable enlarged cardiac silhouette. 2. No acute intrathoracic process. Electronically Signed   By: Randa Ngo M.D.   On: 04/18/2022 18:24   CT ABDOMEN PELVIS W CONTRAST  Result Date: 04/18/2022 CLINICAL DATA:  Acute abdominal pain and low back pain after fall. EXAM: CT ABDOMEN AND PELVIS WITH CONTRAST TECHNIQUE: Multidetector CT imaging of the abdomen and pelvis was performed using the standard protocol following bolus administration of intravenous contrast. RADIATION DOSE REDUCTION: This exam was performed according to the departmental dose-optimization program which includes automated exposure control, adjustment of the mA and/or kV according to patient size and/or use of iterative reconstruction technique. CONTRAST:  149mL OMNIPAQUE IOHEXOL 300 MG/ML  SOLN COMPARISON:  12/06/2009 FINDINGS: Despite efforts by the technologist and patient, motion artifact is present on today's exam and could not be eliminated. This reduces exam sensitivity and specificity. Lower chest: Moderate cardiomegaly with atherosclerotic calcification of the left anterior descending, right, and  circumflex coronary arteries as well as the descending thoracic aorta. 5 by 4 mm right middle lobe pulmonary nodule on image 1 series 4, not included on the prior CT of 12/06/2009. No follow-up imaging of this nodule is recommended. JACR 2018 Feb; 264-273, Management of the Incidental Renal Mass on CT, [employer] 2021; 814-848, Bosniak Classification of Cystic Renal Masses, Version 2019. Per Fleischner Society Guidelines, no routine follow-up imaging is recommended. These guidelines do not apply to immunocompromised patients and patients with cancer. Follow up in patients with significant comorbidities as clinically warranted. For lung cancer screening, adhere to Lung-RADS guidelines. Reference: Radiology. 2017; 284(1):228-43. Hepatobiliary: Unremarkable Pancreas: Unremarkable Spleen: Unremarkable Adrenals/Urinary Tract: Both adrenal glands appear normal. 2.3 by 2.0 cm exophytic hypodense lesion of the right kidney upper pole with internal density of 14 Hounsfield units and relatively homogeneous density characteristics. No follow-up imaging of this cyst is recommended. JACR 2018 Feb; 264-273, Management of the Incidental Renal Mass on CT, [employer] 2021; 814-848, Bosniak Classification of Cystic Renal Masses, Version 2019. Additional small hypodense renal cysts in both kidneys noted. No further imaging workup of these lesions is indicated. Stomach/Bowel: Borderline prominence of gas and stool in the colon, query mild constipation. 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 The saccular portion of the aneurysm measures 4.6 by 2.9 by 4.0 cm, with a margin of thrombosis and a centrally enhancing component. If we include the abdominal aorta itself in the overall size determination, this measures 5.1 cm in maximum dimension. I do not see evidence of acute or  recent retroperitoneal hemorrhage or current aortoduodenal fistula, although the duodenum abuts this saccular aneurysm. Vascular surgical consultation  is recommended. Reference: J Vasc Surg [id number]. 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 Mild residual asymmetry with the right peripheral prostate gland or protuberant than the left. Other: No supplemental non-categorized findings. Musculoskeletal: Please see dedicated spine CT reports. Moderate degenerative hip arthropathy bilaterally. IMPRESSION: 1. 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 Vascular surgical consultation is recommended. 2. Borderline prominence of gas and stool in the colon, query mild constipation. 3. Moderate cardiomegaly with atherosclerotic calcification of the coronary arteries. 4. Moderate degenerative hip arthropathy bilaterally. 5. 5 by 4 mm right middle lobe pulmonary nodule. No follow-up imaging of this nodule is recommended. 6. Aortic and coronary atherosclerosis. 7. 2.3 by 2.0 cm exophytic hypodense lesion of the right kidney upper pole with internal density of 14 Hounsfield units and relatively homogeneous density characteristics. No follow up of this lesion is recommended. Aortic Atherosclerosis (ICD10-I70.0). Electronically Signed   By: Van Clines M.D.   On: 04/18/2022 17:12   CT L-SPINE NO CHARGE  Result Date: 04/18/2022 CLINICAL DATA:  Fall. EXAM: CT LUMBAR SPINE WITHOUT CONTRAST TECHNIQUE: Multidetector CT imaging of the lumbar spine was performed without intravenous contrast administration. Multiplanar CT image reconstructions were also generated. RADIATION DOSE REDUCTION: This exam was performed according to the departmental dose-optimization program which includes automated exposure control, adjustment of the mA and/or kV according to  patient size and/or use of iterative reconstruction technique. COMPARISON:  CT abdomen and pelvis 12/06/2009 FINDINGS: Segmentation: There is transitional lumbosacral anatomy. Designating the lowest ribs is T12, the transitional segment is a partially sacralized L5. Alignment: Mild lumbar levoscoliosis. Trace anterolisthesis of L3 on L4. Vertebrae: Mild T11 and L1 superior endplate compression fractures, chronic in appearance. Scattered Schmorl's nodes. No definite acute fracture. No destructive osseous lesion. Solid bridging anterior vertebral osteophytes at T8-9 and T9-10. Paraspinal and other soft tissues: No acute abnormality in the paraspinal soft tissues. Remainder of the abdomen and pelvis reported separately. Disc levels: Disc bulging and posterior element hypertrophy result in mild spinal stenosis at L2-3 and likely moderate spinal stenosis at L3-4 and L4-5. Mild-to-moderate multilevel neural foraminal stenosis. IMPRESSION: 1. No acute osseous abnormality identified in the lumbar spine. 2. 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 Electronically Signed   By: Logan Bores M.D.   On: 04/18/2022 17:11   CT HEAD WO CONTRAST (5MM)  Result Date: 04/18/2022 CLINICAL DATA:  Head trauma, moderate-severe; Neck trauma (Age >= 65y). EXAM: CT HEAD WITHOUT CONTRAST CT CERVICAL SPINE WITHOUT CONTRAST TECHNIQUE: Multidetector CT imaging of the head and cervical spine was performed following the standard protocol without intravenous contrast. Multiplanar CT image reconstructions of the cervical spine were also generated. RADIATION DOSE REDUCTION: This exam was performed according to the departmental dose-optimization program which includes automated exposure control, adjustment of the mA and/or kV according to patient size and/or use of iterative reconstruction technique. COMPARISON:  CT head and cervical spine 03/19/2020. FINDINGS: CT HEAD FINDINGS Brain: There is no evidence of  an acute infarct, intracranial hemorrhage, mass, midline shift, or extra-axial fluid collection. Moderate ventriculomegaly is unchanged and may reflect central predominant cerebral atrophy or chronic communicating hydrocephalus. Cerebral white matter hypodensities are unchanged and nonspecific but compatible with mild chronic small vessel ischemic disease. 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 Vascular: Calcified atherosclerosis at the skull base. No hyperdense vessel. Skull: No acute  fracture or suspicious osseous lesion. Sinuses/Orbits: Tiny mucous retention cyst in the left sphenoid sinus. Clear mastoid air cells. Bilateral cataract extraction. Other: None. CT CERVICAL SPINE FINDINGS Alignment: Unchanged trace retrolisthesis of C3 on C4. Skull base and vertebrae: Unchanged mild chronic C7 superior endplate compression fracture. No acute fracture or suspicious osseous lesion. Soft tissues and spinal canal: No prevertebral fluid or swelling. No visible canal hematoma. Disc levels: Similar appearance of multilevel disc degeneration, with moderate disc space narrowing noted from C3-4 through C5-6. Disc bulging and uncovertebral spurring at these levels result in mild spinal stenosis an asymmetrically advanced right-sided neural foraminal stenosis. Upper chest: No mass or consolidation in the included lung apices. Other: None. IMPRESSION: 1. No evidence of acute intracranial abnormality. 2. Mild chronic small vessel ischemic disease and unchanged chronic ventriculomegaly. 3. No acute cervical spine fracture. Electronically Signed   By: Logan Bores M.D.   On: 04/18/2022 17:02   CT Cervical Spine Wo Contrast  Result Date: 04/18/2022 CLINICAL DATA:  Head trauma, moderate-severe; Neck trauma (Age >= 65y). EXAM: CT HEAD WITHOUT CONTRAST CT CERVICAL SPINE WITHOUT CONTRAST TECHNIQUE: Multidetector CT imaging of the head and cervical spine was performed following the standard protocol without  intravenous contrast. Multiplanar CT image reconstructions of the cervical spine were also generated. RADIATION DOSE REDUCTION: This exam was performed according to the departmental dose-optimization program which includes automated exposure control, adjustment of the mA and/or kV according to patient size and/or use of iterative reconstruction technique. COMPARISON:  CT head and cervical spine 03/19/2020. FINDINGS: CT HEAD FINDINGS Brain: There is no evidence of an acute infarct, intracranial hemorrhage, mass, midline shift, or extra-axial fluid collection. Moderate ventriculomegaly is unchanged and may reflect central predominant cerebral atrophy or chronic communicating hydrocephalus. Cerebral white matter hypodensities are unchanged and nonspecific but compatible with mild chronic small vessel ischemic disease. 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 Vascular: Calcified atherosclerosis at the skull base. No hyperdense vessel. Skull: No acute fracture or suspicious osseous lesion. Sinuses/Orbits: Tiny mucous retention cyst in the left sphenoid sinus. Clear mastoid air cells. Bilateral cataract extraction. Other: None. CT CERVICAL SPINE FINDINGS Alignment: Unchanged trace retrolisthesis of C3 on C4. Skull base and vertebrae: Unchanged mild chronic C7 superior endplate compression fracture. No acute fracture or suspicious osseous lesion. Soft tissues and spinal canal: No prevertebral fluid or swelling. No visible canal hematoma. Disc levels: Similar appearance of multilevel disc degeneration, with moderate disc space narrowing noted from C3-4 through C5-6. Disc bulging and uncovertebral spurring at these levels result in mild spinal stenosis an asymmetrically advanced right-sided neural foraminal stenosis. Upper chest: No mass or consolidation in the included lung apices. Other: None. IMPRESSION: 1. No evidence of acute intracranial abnormality. 2. Mild chronic small vessel ischemic disease and  unchanged chronic ventriculomegaly. 3. No acute cervical spine fracture. Electronically Signed   By: Logan Bores M.D.   On: 04/18/2022 17:02    Cardiac Studies   Echocardiogram done yesterday showed an EF of 30 to 35% with global hypokinesis, mildly reduced RV function and mild pulmonary hypertension there was mild to moderate mitral regurgitation and at least moderate tricuspid regurgitation  Patient Profile     82 y.o. male  with a hx of permanent A-fib on rivaroxaban, CVA in 2010, HFrEF, HTN, HLD, and OSA on CPAP who is being seen today for preoperative cardiac risk stratification at the request of Dr. Tobie Poet.    Assessment & Plan    1. Acute on chronic systolic heart  failure: Worsening LV systolic function could be due to tachycardia induced cardiomyopathy or progressive underlying coronary artery disease.  Due to this and given the need for preop cardiovascular evaluation for abdominal aortic aneurysm repair, I recommend proceeding with a right and left cardiac catheterization and possible PCI.  I discussed the procedure in details as well as risk and benefits and I also called the patient's brother and his sister-in-law and they are agreeable. No ACE inhibitor or ARB at this time given the need for up titration of his beta-blocker for better rate control of atrial fibrillation.  2.  Permanent atrial fibrillation with RVR: Diltiazem was discontinued due to drop in ejection fraction and Toprol was increased.  If ventricular rate remains uncontrolled, will consider adding digoxin.  Xarelto is currently on hold in anticipation of cardiac catheterization today.  Continue heparin drip in the meanwhile.  3.  Abdominal aortic aneurysm: 5.1 cm.  He was seen by vascular surgery with plans for endovascular repair pending cardiac evaluation.   Total encounter time more than 50 minutes. Greater than 50% was spent in counseling and coordination of care with the patient.       For questions or  updates, please contact [place] Please consult [email or web address] for contact info under        Signed, Kathlyn Sacramento, MD  04/20/2022, 9:46 AM

## 2022-04-20 NOTE — TOC Benefit Eligibility Note (Addendum)
Patient Product/process development scientist completed.    The patient is currently admitted and upon discharge could be taking Entresto 24-26 mg.  The current 30 day co-pay is $47.00.   The patient is currently admitted and upon discharge could be taking Jardiance 10 mg.  The current 30 day co-pay is $47.00.   The patient is currently admitted and upon discharge could be taking Farxiga 10 mg.  The current 30 day co-pay is $47.00.   The patient is insured through [employer] Part D   This test claim was processed through [facility]- copay amounts may vary at other pharmacies due to pharmacy/plan contracts, or as the patient moves through the different stages of their insurance plan.  Gary Dawson, CPHT Pharmacy Patient Advocate Specialist [facility] Patient Advocate Team Direct Number: [phone]  Fax: [phone]

## 2022-04-21 ENCOUNTER — Encounter: Payer: Self-pay | Admitting: Cardiovascular Disease

## 2022-04-21 DIAGNOSIS — I4821 Permanent atrial fibrillation: Secondary | ICD-10-CM | POA: Diagnosis not present

## 2022-04-21 DIAGNOSIS — I428 Other cardiomyopathies: Secondary | ICD-10-CM

## 2022-04-21 DIAGNOSIS — I4891 Unspecified atrial fibrillation: Secondary | ICD-10-CM | POA: Diagnosis not present

## 2022-04-21 DIAGNOSIS — I714 Abdominal aortic aneurysm, without rupture, unspecified: Secondary | ICD-10-CM

## 2022-04-21 DIAGNOSIS — I7143 Infrarenal abdominal aortic aneurysm, without rupture: Secondary | ICD-10-CM | POA: Diagnosis not present

## 2022-04-21 DIAGNOSIS — R531 Weakness: Secondary | ICD-10-CM | POA: Diagnosis not present

## 2022-04-21 LAB — CBC
HCT: 35.7 % — ABNORMAL LOW (ref 39.0–52.0)
Hemoglobin: 12.1 g/dL — ABNORMAL LOW (ref 13.0–17.0)
MCH: 33.7 pg (ref 26.0–34.0)
MCHC: 33.9 g/dL (ref 30.0–36.0)
MCV: 99.4 fL (ref 80.0–100.0)
Platelets: 181 10*3/uL (ref 150–400)
RBC: 3.59 MIL/uL — ABNORMAL LOW (ref 4.22–5.81)
RDW: 13 % (ref 11.5–15.5)
WBC: 5.4 10*3/uL (ref 4.0–10.5)
nRBC: 0 % (ref 0.0–0.2)

## 2022-04-21 LAB — APTT
aPTT: 89 seconds — ABNORMAL HIGH (ref 24–36)
aPTT: 82 seconds — ABNORMAL HIGH (ref 24–36)

## 2022-04-21 LAB — HEPARIN LEVEL (UNFRACTIONATED): Heparin Unfractionated: 0.61 IU/mL (ref 0.30–0.70)

## 2022-04-21 MED ORDER — DIGOXIN 250 MCG PO TABS
0.2500 mg | ORAL_TABLET | Freq: Every day | ORAL | Status: DC
  Administered 2022-04-21 – 2022-04-26 (×6): 0.25 mg via ORAL
  Filled 2022-04-21 (×6): qty 1

## 2022-04-21 MED ORDER — FUROSEMIDE 10 MG/ML IJ SOLN
40.0000 mg | Freq: Once | INTRAMUSCULAR | Status: AC
  Administered 2022-04-21: 40 mg via INTRAVENOUS
  Filled 2022-04-21: qty 4

## 2022-04-21 MED ORDER — METOPROLOL TARTRATE 50 MG PO TABS
50.0000 mg | ORAL_TABLET | Freq: Four times a day (QID) | ORAL | Status: DC
  Administered 2022-04-21 – 2022-04-22 (×8): 50 mg via ORAL
  Filled 2022-04-21 (×8): qty 1

## 2022-04-21 MED ORDER — DILTIAZEM HCL 30 MG PO TABS
30.0000 mg | ORAL_TABLET | Freq: Four times a day (QID) | ORAL | Status: DC
  Administered 2022-04-21 – 2022-04-22 (×6): 30 mg via ORAL
  Filled 2022-04-21 (×6): qty 1

## 2022-04-21 MED ORDER — RIVAROXABAN 20 MG PO TABS
20.0000 mg | ORAL_TABLET | Freq: Every day | ORAL | Status: DC
  Administered 2022-04-21 – 2022-04-25 (×5): 20 mg via ORAL
  Filled 2022-04-21 (×5): qty 1

## 2022-04-21 MED ORDER — FUROSEMIDE 20 MG PO TABS
20.0000 mg | ORAL_TABLET | Freq: Every day | ORAL | Status: DC
  Administered 2022-04-22: 20 mg via ORAL
  Filled 2022-04-21: qty 1

## 2022-04-21 NOTE — Consult Note (Signed)
ANTICOAGULATION CONSULT NOTE Pharmacy Consult for heparin infusion Indication: atrial fibrillation  Allergies  Allergen Reactions   Other Other (See Comments)    Trees and grasses     Patient Measurements: Height: 6\' 1"  (185.4 cm) Weight: 85.3 kg (188 lb 0.8 oz) IBW/kg (Calculated) : 79.9 Heparin Dosing Weight: 88 kg  Vital Signs: Temp: 97.7 F (36.5 C) (04/05 0802) BP: 127/110 (04/05 0802) Pulse Rate: 105 (04/05 0802)  Labs: Recent Labs    04/18/22 1446 04/19/22 0512 04/19/22 0512 04/19/22 1008 04/19/22 1408 04/20/22 0711 04/20/22 1414 04/20/22 1417 04/21/22 0201 04/21/22 0428 04/21/22 1004  HGB 12.6* 11.6*  --   --   --  12.1* 11.6* 11.6*  --  12.1*  --   HCT 37.4* 34.8*  --   --   --  35.8* 34.0* 34.0*  --  35.7*  --   PLT 166 162  --   --   --  168  --   --   --  181  --   APTT  --   --    < > 45*  --  69*  --   --  89*  --  82*  LABPROT  --   --   --  30.1*  --   --   --   --   --   --   --   INR  --   --   --  2.9*  --   --   --   --   --   --   --   HEPARINUNFRC  --   --   --  >1.10*  --  >1.10*  --   --   --   --  0.61  CREATININE 0.78 0.72  --   --   --   --   --   --   --   --   --   TROPONINIHS 19*  --   --   --  16  --   --   --   --   --   --    < > = values in this interval not displayed.     Estimated Creatinine Clearance: 83.2 mL/min (by C-G formula based on SCr of 0.72 mg/dL).   Medical History: Past Medical History:  Diagnosis Date   Atypical chest pain    a. 03/2012 St echo: nl EF, no wma's.   CVA (cerebral vascular accident)    Depression    Dysrhythmia    Elevated PSA    Essential hypertension    Hyperlipidemia    MVP (mitral valve prolapse)    Persistent atrial fibrillation    a. CHA2DS2VASc = 4-->chronic Xarelto.   Psoriasis    Reflux    Sleep apnea    Vitamin B 12 deficiency     Medications:  Xarelto 20 mg po daily PTA for Afib, last dose 04/19/22 @ 0025  Assessment: 82 yo male presented to the ED due to generalized  weakness with increased urinary frequency and urgency.  Patient on Xarelto PTA for Afib.  Pharmacy consulted to start heparin infusion while holding rivaroxaban due to plan cardiac cath .CBC stable. IV heparin was stopped earlier in the day for PCI and restarted.   4/5 0201 aPTT 89 4/5 1004 aPTT 82 HL 0.61    Goal of Therapy:  Heparin level 0.3-0.7 units/ml once heparin level and aPTT correlate.  aPTT 66-102 seconds Monitor platelets by anticoagulation protocol: Yes  Plan:  aPTT is therapeutic and heparin level is trending down. Will continue heparin infusion at 1200 units/hr. Recheck aPTT/heparin level/ CBC with AM labs. Once heparin level is correlating with aPTT, switch to heparin level monitoring. If no procedure is planned possibly can switch back to rivaroxaban.    Paschal Dopp, PharmD, 04/21/2022 10:45 AM

## 2022-04-21 NOTE — Progress Notes (Signed)
Occupational Therapy Treatment Patient Details Name: Gary Dawson MRN: [id number] DOB: [birth year] Today's Date: 04/21/2022   History of present illness Pt is 82 y/o male with PMH including a-fib, CVA, OSA, weakness. Pt admitted with increasing weakness, back pain, two recent falls per medical record.   OT comments  Pt received semi-reclined in bed. Appearing alert; OT noted once pt started moving that he'd had bowel and bladder incontinence in the bed; willing to work with OT on going to the bathroom for toileting. T/f MIN A from bed elevated and use of gait belt. In bathroom, pt with bowel and bladder incontinence right before sitting down on toilet; pt unsafe with use of RW despite OT cues for safety while managing stressful situation.  See flowsheet below for further details of session. Left seated in chair with chair alarm on with all needs in reach.     Recommendations for follow up therapy are one component of a multi-disciplinary discharge planning process, led by the attending physician.  Recommendations may be updated based on patient status, additional functional criteria and insurance authorization.    Assistance Recommended at Discharge Frequent or constant Supervision/Assistance  Patient can return home with the following  A little help with walking and/or transfers;A little help with bathing/dressing/bathroom;Assistance with cooking/housework;Direct supervision/assist for medications management;Direct supervision/assist for financial management;Assist for transportation;Help with stairs or ramp for entrance   Equipment Recommendations  Other (comment) (defer to next venue of care)    Recommendations for Other Services      Precautions / Restrictions Precautions Precautions: Fall Restrictions Weight Bearing Restrictions: No       Mobility Bed Mobility Overal bed mobility: Needs Assistance Bed Mobility: Supine to Sit     Supine to sit: Supervision           Transfers Overall transfer level: Needs assistance Equipment used: Rolling walker (2 wheels) Transfers: Sit to/from Stand Sit to Stand: From elevated surface, Min assist           General transfer comment: Pt initially tried twice to stand from low bed, but unable to do so; gait belt donned and bed elevated; MIN A ultimately.     Balance Overall balance assessment: Needs assistance Sitting-balance support: Feet supported Sitting balance-Leahy Scale: Good     Standing balance support: Bilateral upper extremity supported, During functional activity, Reliant on assistive device for balance Standing balance-Leahy Scale: Fair Standing balance comment: reliant on RW                           ADL either performed or assessed with clinical judgement   ADL Overall ADL's : Needs assistance/impaired                     Lower Body Dressing: Set up;Maximal assistance Lower Body Dressing Details (indicate cue type and reason): Pt set up to don shoes at EOB; however, once shoes/socks soiled on BSC, pt requiring MAX A to change into non-skid socks. Toilet Transfer: Minimal Dentistassistance Toilet Transfer Details (indicate cue type and reason): Pt with poor stand to sit coordination and needing cues on hand placement for safe transfer. Toileting- Clothing Manipulation and Hygiene: Moderate assistance Toileting - Clothing Manipulation Details (indicate cue type and reason): Pt requiring MOD A to be sure he is clean from bowel incontinence that got all over buttocks and back of legs.       General ADL Comments: Pt demonstrating poor management of RW during  ADLs, needing frequent cues; became even more unsafe when he began having incontinence and was rushing himself to the bathroom despite OT cues to slow down and that fall prevention was more important than the mess on the floor. Aniticpate pt would be very unsafe and very high risk for falls in home environment if he were to have  such an episode at home without assistance.    Extremity/Trunk Assessment Upper Extremity Assessment Upper Extremity Assessment: Overall WFL for tasks assessed   Lower Extremity Assessment Lower Extremity Assessment: Defer to PT evaluation        Vision       Perception     Praxis      Cognition Arousal/Alertness: Awake/alert Behavior During Therapy: WFL for tasks assessed/performed Overall Cognitive Status: No family/caregiver present to determine baseline cognitive functioning                                 General Comments: Pt with deficits in memory (not knowing what day it is; not knowing if he had procedure yesterday or not); pt able to state he has poor memory. Pt demonstrating very decreased safety awareness today during functional mobility and with bowel and bladder incontinence, trying to rush to the toilet, turning quickly, poor walker management; very high risk for falls. Pt pleasant and cooperative throughout session. Pt was able to state that he wanted cream and sugar with his coffee, and able to use the hospital phone to call dining services to make the request.        Exercises      Shoulder Instructions       General Comments HR remains high; cleared by RN for mobility to the bathroom despite HR range from 120's-150's. RN in and out of room throughout session; assisting with supplies after pt incontinence episode. Pt on heparin drip throughout most of session; RN disconnected it for end of session to mobilize to chair. Pt left in chair with alarm on; RN, NT, MD, and PT aware of pt status and session.    Pertinent Vitals/ Pain       Pain Assessment Pain Assessment: No/denies pain  Home Living                                          Prior Functioning/Environment              Frequency  Min 2X/week        Progress Toward Goals  OT Goals(current goals can now be found in the care plan section)  Progress  towards OT goals: Progressing toward goals  Acute Rehab OT Goals Patient Stated Goal: Get better OT Goal Formulation: With patient Time For Goal Achievement: 05/03/22 Potential to Achieve Goals: Good  Plan Discharge plan needs to be updated    Co-evaluation                 AM-PAC OT "6 Clicks" Daily Activity     Outcome Measure   Help from another person eating meals?: None Help from another person taking care of personal grooming?: A Little Help from another person toileting, which includes using toliet, bedpan, or urinal?: A Lot Help from another person bathing (including washing, rinsing, drying)?: A Lot Help from another person to put on and taking off regular upper body clothing?: A Little  Help from another person to put on and taking off regular lower body clothing?: A Little 6 Click Score: 17    End of Session Equipment Utilized During Treatment: Gait belt;Rolling walker (2 wheels) (BSC over toilet)  OT Visit Diagnosis: Unsteadiness on feet (R26.81);History of falling (Z91.81)   Activity Tolerance Patient tolerated treatment well   Patient Left in chair;with call bell/phone within reach;with chair alarm set   Nurse Communication Mobility status        Time: 7829-5621 OT Time Calculation (min): 51 min  Charges: OT General Charges $OT Visit: 1 Visit OT Treatments $Self Care/Home Management : 38-52 mins  Linward Foster, MS, OTR/L   Alvester Morin 04/21/2022, 9:57 AM

## 2022-04-21 NOTE — Progress Notes (Signed)
Progress Note   Patient: Gary Dawson DOB: March 04, 1940 DOA: 04/18/2022     3 DOS: the patient was seen and examined on 04/21/2022   Brief hospital course: [name] is a 82 year old male with history of permanent atrial fibrillation on Xarelto, cardiomyopathy, OSA on CPAP, history of hernia repair, history of tonsillectomy, who presents to the emergency department from urgent care center for chief concerns frequent falls and was sent to the ED for altered mental status.  Vitals in the emergency department showed temperature of 99.7, respiration rate 29, heart rate 98, blood pressure 129/104, SpO2 of 96% on room air.  Serum sodium is 137, potassium 4.1, chloride 102, bicarb 25, BUN of 27, serum creatinine 0.78, nonfasting blood glucose 112, EGFR greater than 60, WBC 8.7, hemoglobin 12.6, platelets of 166.  High since troponin is 19.  Lactic acid is 1.7.  CT of the head without contrast and CT cervical spine without contrast: Was read as no evidence of acute intracranial abnormality.  No acute cervical spine fracture.  Mild chronic small vessel ischemic disease and unchanged chronic ventriculomegaly.  CT abdomen pelvis with contrast: Was read as infrarenal saccular abdominal aortic aneurysm measuring up to 5.1 cm in maximum dimension.  Vascular surgical consultation is recommended.  Borderline prominence of gas and stool query mild constipation.  Moderate cardiomegaly with atherosclerotic calcification of the coronary arteries.  5 x 4 mm right middle lobe pulmonary nodule, no follow-up imaging of this nodule recommended.  Aortic and coronary atherosclerosis.  2.3 x 2.0 cm exophytic hypodense lesion in the right kidney and upper pole with internal density of 14 Hounsfield units.  No follow-up of this lesion is recommended.  CT L-spine: Was read as no acute osseous abnormality identified in the lumbar spine.  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  4/3: Vital stable.  Heart rate 80.  Cardiology and vascular surgery on board.  Patient had high risk aneurysm which need urgent intervention, will need a cardiac cath and goals of care discussion before that.  B12 elevated above 2500. Cardizem infusion was discontinued and patient was started on metoprolol. Echocardiogram with further decreased LVEF to 30 to 35%.  It was 45% on prior echo. Patient was started on heparin infusion while holding Xarelto and most likely will be getting his cardiac cath on Thursday afternoon or Friday morning.  4/4: Hemodynamically stable with well-controlled heart rate this morning.  Awaiting cardiac catheterization.  4/5: Heart rate remained mildly elevated in low 100s.  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  EF was moderately to severely reduced by echo.  Right heart cath shows mildly elevated left filling pressure, mild pulmonary hypertension and normal cardiac output.. Metoprolol dose was increased and they placed him as moderate risk for surgical interventions.  Cardiology also started him on digoxin. Vascular surgery will do procedure in 2 weeks-heparin is being switched with Xarelto  PT is recommending SNF.  TOC consult placed  Assessment and Plan: * Atrial fibrillation with rapid ventricular response  TSH level checked on 02/08/2022 and the value was 1.50 UA done was negative for leukocytes and nitrites and bacteria No clinical signs for infectious etiology at this time S/P Cardizem infusion which has been no switched with metoprolol. Echocardiogram  with worsening of EF so home Cardizem was discontinued. Has remained in RVR despite increasing the dose of metoprolol Cardiology  added digoxin. -Continue to monitor -Heparin now switched to Xarelto  Infrarenal abdominal aortic aneurysm (AAA) without rupture Infrarenal saccular abdominal aortic aneurysm measuring up to 5.1 cm in maximum dimension Vascular surgeon, Dr. Wyn Quaker has been consulted and they will plan a repair in 2 weeks as outpatient  Weakness Generalized weakness Query deconditioning however given patient's age, B12 elevated Can be due to worsening cardiomyopathy. -PT/OT are recommending home health  Permanent atrial fibrillation -Continue home metoprolol-will need titration of dose -Home Cardizem was discontinued by cardiology due to concern of worsening cardiomyopathy -Home Xarelto is temporarily being converted with heparin  NICM (nonischemic cardiomyopathy) Echocardiogram today with further decrease in his EF as compared to the prior done in 2022.  Apparently he also has an high risk stress test and never had left heart catheterization. 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  Concern of secondary to tachycardia. -Cardiology added Lasix -We will consider adding ACE/ARB as blood pressure permits after adequate up titration of beta-blocker/heart rate control  OSA on CPAP CPAP nightly ordered  Hyperlipidemia -Continue Lipitor  Benign essential hypertension Blood pressure currently within goal. -Continue metoprolol -Home Cardizem was discontinued -Most likely he will be started on Entresto by cardiology  CVA (cerebral vascular accident) Patient has an history of CVA.  No acute concerns -Continue aspirin and Lipitor   Subjective: Patient was seen and examined today.  No new concern.  Agrees to go to rehab before returning home.  Physical Exam: Vitals:   04/20/22 2338 04/21/22 0441 04/21/22 0802 04/21/22 1128  BP: (!) 130/97 (!) 126/90 (!) 127/110 127/76  Pulse: 100 (!) 106 (!) 105 (!) 112  Resp: 18 18 20 20   Temp: 98.3 F  (36.8 C) 98.3 F (36.8 C) 97.7 F (36.5 C) (!) 97.5 F (36.4 C)  TempSrc:      SpO2: 98% 98% 100% 98%  Weight:  85.3 kg    Height:       General.  Frail elderly man, in no acute distress. Pulmonary.  Lungs clear bilaterally, normal respiratory effort. CV.  Irregularly irregular Abdomen.  Soft, nontender, nondistended, BS positive. CNS.  Alert and oriented .  No focal neurologic deficit. Extremities.  No edema, no cyanosis, pulses intact and symmetrical. Psychiatry.  Judgment and insight appears normal.   Data Reviewed: Prior data reviewed  Family Communication:   Disposition: Status is: Inpatient Remains inpatient appropriate because: Severity of illness  Planned Discharge Destination: Home with Home Health  DVT prophylaxis.  Heparin Time spent: 44 minutes  This record has been created using Conservation officer, historic buildings. Errors have been sought and corrected,but may not always be located. Such creation errors do not reflect on the standard of care.   Author: Arnetha Courser, MD 04/21/2022 3:28 PM  For on call review [email or web address].

## 2022-04-21 NOTE — NC FL2 (Signed)
[place] MEDICAID FL2 LEVEL OF CARE FORM     IDENTIFICATION  Patient Name: Gary Dawson Birthdate: 1940/07/06 Sex: male Admission Date (Current Location): 04/18/2022  [facility] and [place] Number:  Chiropodist and Address:  [facility], [address] [place], [place] [address]      Provider Number: [id number]  Attending Physician Name and Address:  Arnetha Courser, MD  Relative Name and Phone Number:  Verdis Frederickson  [phone]    Current Level of Care: Hospital Recommended Level of Care: Skilled Nursing Facility Prior Approval Number:    Date Approved/Denied:   PASRR Number: [id number]  Discharge Plan: SNF    Current Diagnoses: Patient Active Problem List   Diagnosis Date Noted   Abdominal aortic aneurysm (AAA) without rupture 04/21/2022   Acute on chronic systolic (congestive) heart failure 04/20/2022   NICM (nonischemic cardiomyopathy) 04/19/2022   Atrial fibrillation with rapid ventricular response 04/18/2022   Infrarenal abdominal aortic aneurysm (AAA) without rupture 04/18/2022   Weakness 04/18/2022   OSA on CPAP 04/18/2022   Hindfoot pronation of right foot 04/18/2021   Neuropathy 04/15/2021   Chronic pain of right ankle 05/26/2019   Torticollis 12/15/2016   Encounter for anticoagulation discussion and counseling 11/13/2014   Increased frequency of urination 11/04/2013   PIN III (prostatic intraepithelial neoplasm III) 11/04/2013   Sleep disturbance 11/04/2013   Pure hypercholesterolemia 05/22/2013   Benign essential hypertension 05/08/2013   Hyperlipidemia 05/08/2013   Shortness of breath 05/08/2013   Permanent atrial fibrillation 05/06/2013   CVA (cerebral vascular accident) 05/06/2013   Benign localized prostatic hyperplasia with lower urinary tract symptoms (LUTS) 03/06/2012   Calculus in bladder 03/06/2012   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   Elevated prostate specific antigen (PSA) 03/06/2012    Incomplete emptying of bladder 03/06/2012   Kidney stone 03/06/2012   Microscopic hematuria 03/06/2012   Sleep apnea in adult 08/27/2003   Enlarged prostate 08/26/1973   Irregular heart beat 08/26/1973    Orientation RESPIRATION BLADDER Height & Weight     Self, Place  Normal External catheter Weight: 85.3 kg Height:  6\' 1"  (185.4 cm)  BEHAVIORAL SYMPTOMS/MOOD NEUROLOGICAL BOWEL NUTRITION STATUS  Other (Comment) (n/a)  (n/a) Incontinent Diet (Heart)  AMBULATORY STATUS COMMUNICATION OF NEEDS Skin   Limited Assist Verbally Normal                       Personal Care Assistance Level of Assistance  Bathing, Dressing Bathing Assistance: Limited assistance   Dressing Assistance: Limited assistance     Functional Limitations Info  Sight Sight Info: Impaired        SPECIAL CARE FACTORS FREQUENCY  PT (By licensed PT), OT (By licensed OT)     PT Frequency: Min 2x weekly OT Frequency: Min 2x weekly            Contractures Contractures Info: Not present    Additional Factors Info  Code Status, Allergies Code Status Info: FULL Allergies Info: Other           Current Medications (04/21/2022):  This is the current hospital active medication list Current Facility-Administered Medications  Medication Dose Route Frequency Provider Last Rate Last Admin   0.9 %  sodium chloride infusion  250 mL Intravenous PRN [name], MD       acetaminophen (TYLENOL) tablet 650 mg  650 mg Oral Q6H PRN [name], MD   650 mg at 04/20/22 1800   Or  acetaminophen (TYLENOL) suppository 650 mg  650 mg Rectal Q6H PRN [name], MD       aspirin EC tablet 81 mg  81 mg Oral Daily Lorine Bears A, MD   81 mg at 04/21/22 0954   atorvastatin (LIPITOR) tablet 40 mg  40 mg Oral Daily [name], MD   40 mg at 04/21/22 5732   digoxin (LANOXIN) tablet 0.25 mg  0.25 mg Oral Daily Debbe Odea, MD   0.25 mg at 04/21/22 1048   [START ON 04/22/2022] furosemide  (LASIX) tablet 20 mg  20 mg Oral Daily Furth, Cadence H, PA-C       lidocaine (LIDODERM) 5 % 2 patch  2 patch Transdermal Q24H [name], MD   2 patch at 04/20/22 2154   metoprolol tartrate (LOPRESSOR) tablet 50 mg  50 mg Oral QID Debbe Odea, MD   50 mg at 04/21/22 1152   ondansetron (ZOFRAN) tablet 4 mg  4 mg Oral Q6H PRN [name], MD       Or   ondansetron (ZOFRAN) injection 4 mg  4 mg Intravenous Q6H PRN [name], MD       polyvinyl alcohol (LIQUIFILM TEARS) 1.4 % ophthalmic solution 1 drop  1 drop Both Eyes BID PRN [name], MD       rivaroxaban (XARELTO) tablet 20 mg  20 mg Oral Q supper Arnetha Courser, MD   20 mg at 04/21/22 1152   senna-docusate (Senokot-S) tablet 1 tablet  1 tablet Oral QHS PRN Lorine Bears A, MD       sodium chloride flush (NS) 0.9 % injection 3 mL  3 mL Intravenous Q12H Lorine Bears A, MD   3 mL at 04/21/22 1025   sodium chloride flush (NS) 0.9 % injection 3 mL  3 mL Intravenous PRN [name], MD       traMADol (ULTRAM) tablet 50 mg  50 mg Oral Q12H PRN [name], MD         Discharge Medications: Please see discharge summary for a list of discharge medications.  Relevant Imaging Results:  Relevant Lab Results:   Additional Information SS# [id number]  Truddie Hidden, RN

## 2022-04-21 NOTE — Consult Note (Signed)
ANTICOAGULATION CONSULT NOTE Pharmacy Consult for heparin infusion Indication: atrial fibrillation  Allergies  Allergen Reactions   Other Other (See Comments)    Trees and grasses     Patient Measurements: Height: 6\' 1"  (185.4 cm) Weight: 87.7 kg (193 lb 5.5 oz) IBW/kg (Calculated) : 79.9 Heparin Dosing Weight: 88 kg  Vital Signs: Temp: 98.3 F (36.8 C) (04/04 2338) BP: 130/97 (04/04 2338) Pulse Rate: 100 (04/04 2338)  Labs: Recent Labs    04/18/22 1446 04/19/22 0512 04/19/22 1008 04/19/22 1408 04/20/22 0711 04/20/22 1414 04/20/22 1417 04/21/22 0201  HGB 12.6* 11.6*  --   --  12.1* 11.6* 11.6*  --   HCT 37.4* 34.8*  --   --  35.8* 34.0* 34.0*  --   PLT 166 162  --   --  168  --   --   --   APTT  --   --  45*  --  69*  --   --  89*  LABPROT  --   --  30.1*  --   --   --   --   --   INR  --   --  2.9*  --   --   --   --   --   HEPARINUNFRC  --   --  >1.10*  --  >1.10*  --   --   --   CREATININE 0.78 0.72  --   --   --   --   --   --   TROPONINIHS 19*  --   --  16  --   --   --   --      Estimated Creatinine Clearance: 83.2 mL/min (by C-G formula based on SCr of 0.72 mg/dL).   Medical History: Past Medical History:  Diagnosis Date   Atypical chest pain    a. 03/2012 St echo: nl EF, no wma's.   CVA (cerebral vascular accident)    Depression    Dysrhythmia    Elevated PSA    Essential hypertension    Hyperlipidemia    MVP (mitral valve prolapse)    Persistent atrial fibrillation    a. CHA2DS2VASc = 4-->chronic Xarelto.   Psoriasis    Reflux    Sleep apnea    Vitamin B 12 deficiency     Medications:  Xarelto 20 mg po daily PTA for Afib, last dose 04/19/22 @ 0025  Assessment: 82 yo male presented to the ED due to generalized weakness with increased urinary frequency and urgency.  Patient on Xarelto PTA for Afib.  Pharmacy consulted to start heparin infusion while holding rivaroxaban due to plan cardiac cath .CBC stable. IV heparin was stopped earlier in  the day for PCI   Goal of Therapy:  Heparin level 0.3-0.7 units/ml once heparin level and aPTT correlate.  aPTT 66-102 seconds Monitor platelets by anticoagulation protocol: Yes   4/05 0201 aPTT 89, therapeutic x 1  Plan:  ---Continue heparin infusion at 1200 units/hr  ---Recheck aPTT in 8 hours to confirm, then daily ---Order heparin level and CBC with AM labs.  ---Once heparin level is correlating with aPTT, switch to heparin level monitoring.  ---daily CBC while on IV heparin  Otelia Sergeant, PharmD, [facility] 04/21/2022 3:18 AM

## 2022-04-21 NOTE — Progress Notes (Signed)
PT Cancellation Note  Patient Details Name: Bard Nattress MRN: [id number] DOB: 06-29-1940   Cancelled Treatment:    Reason Eval/Treat Not Completed: Medical issues which prohibited therapy (Per tele monitor, pt in AF, sustaind rates 120-150s. DIscussed with RN. WIll wait until VSS prior to proceeding with PT evaluation.)  11:00 AM, 04/21/22 Rosamaria Lints, PT, DPT Physical Therapist - [facility]  [phone] (ASCOM)    Willow Reczek C 04/21/2022, 10:59 AM

## 2022-04-21 NOTE — Progress Notes (Signed)
  Progress Note    04/21/2022 12:09 PM 1 Day Post-Op  Subjective: Patient resting comfortably in bed today.  Plan is to take patient to cardiac catheterization for left and right heart cath.  He continues to be in atrial fibrillation.  He denies any chest pain or shortness of breath this morning.   Vitals:   04/21/22 0802 04/21/22 1128  BP: (!) 127/110 127/76  Pulse: (!) 105 (!) 112  Resp: 20 20  Temp: 97.7 F (36.5 C) (!) 97.5 F (36.4 C)  SpO2: 100% 98%   Physical Exam: Cardiac:  AFIB with irregularity and tachycardic. No murmurs, rubs or gallops.  Lungs: There to auscultation bilaterally. Incisions:  N/A Extremities: Multiple pulses throughout Abdomen: Positive bowel sounds on auscultation, soft flat nontender nondistended Neurologic: Nonfocal  CBC    Component Value Date/Time   WBC 5.4 04/21/2022 0428   RBC 3.59 (L) 04/21/2022 0428   HGB 12.1 (L) 04/21/2022 0428   HCT 35.7 (L) 04/21/2022 0428   PLT 181 04/21/2022 0428   MCV 99.4 04/21/2022 0428   MCH 33.7 04/21/2022 0428   MCHC 33.9 04/21/2022 0428   RDW 13.0 04/21/2022 0428   LYMPHSABS 0.8 04/18/2022 1446   MONOABS 1.2 (H) 04/18/2022 1446   EOSABS 0.0 04/18/2022 1446   BASOSABS 0.0 04/18/2022 1446    BMET    Component Value Date/Time   NA 137 04/20/2022 1417   NA 139 05/17/2020 1455   K 4.1 04/20/2022 1417   CL 105 04/19/2022 0512   CO2 24 04/19/2022 0512   GLUCOSE 104 (H) 04/19/2022 0512   BUN 23 04/19/2022 0512   BUN 27 05/17/2020 1455   CREATININE 0.72 04/19/2022 0512   CALCIUM 8.4 (L) 04/19/2022 0512   GFRNONAA >60 04/19/2022 0512   GFRAA >60 09/19/2018 1453    INR    Component Value Date/Time   INR 2.9 (H) 04/19/2022 1008     Intake/Output Summary (Last 24 hours) at 04/21/2022 1209 Last data filed at 04/21/2022 1208 Gross per 24 hour  Intake 240 ml  Output 2300 ml  Net -2060 ml     Assessment/Plan:  82 y.o. male is s/p cardiac catheterization without intervention.  1 Day Post-Op    PLAN: Vascular Surgery will set up follow-up in 2 weeks on an outpatient basis to discuss endovascular intervention to repair saccular AAA. Vascular surgery is okay with patient being on NOAC at this time.  DVT prophylaxis:  Xarelto 20 mg daily.   Marcie Bal [facility] 04/21/2022 12:09 PM

## 2022-04-21 NOTE — Assessment & Plan Note (Signed)
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 Wyn Quaker has been consulted and they will plan a repair in 2 weeks as outpatient -Patient will need to hold Xarelto 2 days prior to surgery

## 2022-04-21 NOTE — Progress Notes (Signed)
Rounding Note    Patient Name: Gary Dawson Date of Encounter: 04/21/2022  [facility] Cardiologist: Julien Nordmann, MD   Subjective   Denies palpitations, endorses shortness of breath for the first time today.  Inpatient Medications    Scheduled Meds:  aspirin EC  81 mg Oral Daily   atorvastatin  40 mg Oral Daily   digoxin  0.25 mg Oral Daily   [START ON 04/22/2022] furosemide  20 mg Oral Daily   lidocaine  2 patch Transdermal Q24H   metoprolol tartrate  50 mg Oral QID   rivaroxaban  20 mg Oral Q supper   sodium chloride flush  3 mL Intravenous Q12H   Continuous Infusions:  sodium chloride     PRN Meds: sodium chloride, acetaminophen **OR** acetaminophen, ondansetron **OR** ondansetron (ZOFRAN) IV, polyvinyl alcohol, senna-docusate, sodium chloride flush, traMADol   Vital Signs    Vitals:   04/20/22 2338 04/21/22 0441 04/21/22 0802 04/21/22 1128  BP: (!) 130/97 (!) 126/90 (!) 127/110 127/76  Pulse: 100 (!) 106 (!) 105 (!) 112  Resp: 18 18 20 20   Temp: 98.3 F (36.8 C) 98.3 F (36.8 C) 97.7 F (36.5 C) (!) 97.5 F (36.4 C)  TempSrc:      SpO2: 98% 98% 100% 98%  Weight:  85.3 kg    Height:        Intake/Output Summary (Last 24 hours) at 04/21/2022 1157 Last data filed at 04/21/2022 0900 Gross per 24 hour  Intake 240 ml  Output 1100 ml  Net -860 ml      04/21/2022    4:41 AM 04/20/2022    4:54 AM 04/19/2022   12:09 PM  Last 3 Weights  Weight (lbs) 188 lb 0.8 oz 193 lb 5.5 oz 194 lb 0.1 oz  Weight (kg) 85.3 kg 87.7 kg 88 kg      Telemetry    Atrial fibrillation, heart rate 146- Personally Reviewed  ECG     - Personally Reviewed  Physical Exam   GEN: No acute distress.   Neck: No JVD Cardiac: Irregular, tachycardic Respiratory: Diminished breath sounds at bases GI: Soft, nontender, non-distended  MS: No edema; No deformity. Neuro:  Nonfocal  Psych: Normal affect   Labs    High Sensitivity Troponin:   Recent Labs  Lab  04/18/22 1446 04/19/22 1408  TROPONINIHS 19* 16     Chemistry Recent Labs  Lab 04/18/22 1446 04/19/22 0512 04/20/22 1414 04/20/22 1417  NA 137 138 138 137  K 4.1 3.7 4.1 4.1  CL 102 105  --   --   CO2 25 24  --   --   GLUCOSE 112* 104*  --   --   BUN 27* 23  --   --   CREATININE 0.78 0.72  --   --   CALCIUM 8.9 8.4*  --   --   MG  --  2.0  --   --   PROT 7.8  --   --   --   ALBUMIN 3.8  --   --   --   AST 63*  --   --   --   ALT 34  --   --   --   ALKPHOS 56  --   --   --   BILITOT 1.8*  --   --   --   GFRNONAA >60 >60  --   --   ANIONGAP 10 9  --   --  Lipids No results for input(s): "CHOL", "TRIG", "HDL", "LABVLDL", "LDLCALC", "CHOLHDL" in the last 168 hours.  Hematology Recent Labs  Lab 04/19/22 0512 04/20/22 0711 04/20/22 1414 04/20/22 1417 04/21/22 0428  WBC 6.2 5.6  --   --  5.4  RBC 3.45* 3.58*  --   --  3.59*  HGB 11.6* 12.1* 11.6* 11.6* 12.1*  HCT 34.8* 35.8* 34.0* 34.0* 35.7*  MCV 100.9* 100.0  --   --  99.4  MCH 33.6 33.8  --   --  33.7  MCHC 33.3 33.8  --   --  33.9  RDW 13.1 13.1  --   --  13.0  PLT 162 168  --   --  181   Thyroid No results for input(s): "TSH", "FREET4" in the last 168 hours.  BNPNo results for input(s): "BNP", "PROBNP" in the last 168 hours.  DDimer No results for input(s): "DDIMER" in the last 168 hours.   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   1st Diag lesion is 40% stenosed.   Mid LAD to Dist LAD lesion is 30% stenosed. 1.  Heavily calcified coronary arteries with mild to moderate nonobstructive coronary artery disease with the exception of an occluded proximal right PDA with left-to-right collaterals. 2.  Left ventricular angiography was not performed.  EF was moderately to severely reduced by echo. 3.  Right heart catheterization showed mildly elevated left-sided filling pressures, mild pulmonary hypertension and normal cardiac output. 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 His coronary arteries are heavily calcified but with no obstructive disease and does not require revascularization.  Recommend aggressive medical therapy.\ I increased the dose of Toprol for better rate control of atrial fibrillation. Heparin can be resumed at 6 PM and can be transition back to Xarelto if no plans for AAA repair during this admission. The patient is considered at moderate risk from a cardiac standpoint.   ECHOCARDIOGRAM COMPLETE  Result Date: 04/19/2022    ECHOCARDIOGRAM REPORT   Patient Name:   Gary HYNEK [facility] Date of Exam: 04/19/2022 Medical Rec #:  [id number]              Height:       73.0 in Accession #:    [id number]             Weight:       194.0 lb Date of Birth:  1940-10-22              BSA:          2.124 m Patient Age:    82 years               BP:           132/87 mmHg Patient Gender: M                      HR:           88 bpm. Exam Location:  [facility] Procedure: 2D Echo, Cardiac Doppler and Color Doppler Indications:     Atrial Fibrillation I48.91  History:         Patient has prior history of Echocardiogram examinations, most                  recent 05/19/2020. Mitral Valve Prolapse; Risk                  Factors:Hypertension. Persistent Afib.  Sonographer:     Cristela BlueJerry Hege Referring Phys:  [id number] AMY N COX Diagnosing Phys: Debbe OdeaBrian Agbor-Etang MD IMPRESSIONS  1. Left ventricular ejection fraction, by estimation, is 30 to 35%. The left ventricle has moderate to severely decreased function. The left ventricle demonstrates global hypokinesis. There is mild left ventricular hypertrophy. Left ventricular diastolic parameters are indeterminate.  2. Right ventricular systolic function is mildly reduced. The right ventricular size is moderately enlarged. There is mildly elevated pulmonary artery systolic pressure.  3. Right atrial size was severely dilated.  4. The mitral valve is normal in structure. Mild mitral valve regurgitation.  5.  Tricuspid valve regurgitation is moderate.  6. The aortic valve is tricuspid. Aortic valve regurgitation is mild. Aortic valve sclerosis/calcification is present, without any evidence of aortic stenosis.  7. Aortic dilatation noted. There is mild dilatation of the aortic root, measuring 40 mm.  8. The inferior vena cava is normal in size with <50% respiratory variability, suggesting right atrial pressure of 8 mmHg. FINDINGS  Left Ventricle: Left ventricular ejection fraction, by estimation, is 30 to 35%. The left ventricle has moderate to severely decreased function. The left ventricle demonstrates global hypokinesis. The left ventricular internal cavity size was normal in size. There is mild left ventricular hypertrophy. Left ventricular diastolic parameters are indeterminate. Right Ventricle: The right ventricular size is moderately enlarged. No increase in right ventricular wall thickness. Right ventricular systolic function is mildly reduced. There is mildly elevated pulmonary artery systolic pressure. The tricuspid regurgitant velocity is 2.84 m/s, and with an assumed right atrial pressure of 8 mmHg, the estimated right ventricular systolic pressure is 40.3 mmHg. Left Atrium: Left atrial size was normal in size. Right Atrium: Right atrial size was severely dilated. Pericardium: There is no evidence of pericardial effusion. Mitral Valve: The mitral valve is normal in structure. Mild mitral valve regurgitation. Tricuspid Valve: The tricuspid valve is normal in structure. Tricuspid valve regurgitation is moderate. Aortic Valve: The aortic valve is tricuspid. Aortic valve regurgitation is mild. Aortic regurgitation PHT measures 595 msec. Aortic valve sclerosis/calcification is present, without any evidence of aortic stenosis. Aortic valve mean gradient measures 2.0  mmHg. Aortic valve peak gradient measures 3.1 mmHg. Aortic valve area, by VTI measures 3.35 cm. Pulmonic Valve: The pulmonic valve was normal in  structure. Pulmonic valve regurgitation is mild. Aorta: Aortic dilatation noted. There is mild dilatation of the aortic root, measuring 40 mm. Venous: The inferior vena cava is normal in size with less than 50% respiratory variability, suggesting right atrial pressure of 8 mmHg. IAS/Shunts: No atrial level shunt detected by color flow Doppler.  LEFT VENTRICLE PLAX 2D LVIDd:         5.30 cm LVIDs:         4.90 cm LV PW:         1.10 cm LV IVS:        1.20 cm LVOT diam:     2.30 cm LV SV:         52 LV SV Index:   24 LVOT Area:     4.15 cm  LV Volumes (MOD) LV vol d, MOD A2C: 99.4 ml LV vol d, MOD A4C: 109.2 ml LV vol s, MOD A2C: 55.0 ml LV vol s, MOD A4C: 72.2 ml LV SV MOD A2C:     44.4 ml LV SV MOD A4C:     109.2 ml LV SV MOD BP:      43.1 ml RIGHT VENTRICLE RV Basal diam:  5.60 cm  RV Mid diam:    3.20 cm RV S prime:     7.83 cm/s TAPSE (M-mode): 1.1 cm LEFT ATRIUM             Index        RIGHT ATRIUM           Index LA diam:        4.50 cm 2.12 cm/m   RA Area:     36.40 cm LA Vol (A2C):   45.3 ml 21.33 ml/m  RA Volume:   151.00 ml 71.09 ml/m LA Vol (A4C):   47.5 ml 22.36 ml/m LA Biplane Vol: 50.7 ml 23.87 ml/m  AORTIC VALVE AV Area (Vmax):    3.35 cm AV Area (Vmean):   3.27 cm AV Area (VTI):     3.35 cm AV Vmax:           88.15 cm/s AV Vmean:          59.250 cm/s AV VTI:            0.155 m AV Peak Grad:      3.1 mmHg AV Mean Grad:      2.0 mmHg LVOT Vmax:         71.10 cm/s LVOT Vmean:        46.600 cm/s LVOT VTI:          0.125 m LVOT/AV VTI ratio: 0.81 AI PHT:            595 msec  AORTA Ao Root diam: 4.07 cm MITRAL VALVE               TRICUSPID VALVE MV Area (PHT): 6.43 cm    TR Peak grad:   32.3 mmHg MV Decel Time: 118 msec    TR Vmax:        284.00 cm/s MV E velocity: 76.50 cm/s                            SHUNTS                            Systemic VTI:  0.12 m                            Systemic Diam: 2.30 cm Debbe Odea MD Electronically signed by Debbe Odea MD Signature Date/Time:  04/19/2022/1:45:20 PM    Final     Cardiac Studies   LHC 04/20/2022   RPDA lesion is 100% stenosed.   1st Diag lesion is 40% stenosed.   Mid LAD to Dist LAD lesion is 30% stenosed.   1.  Heavily calcified coronary arteries with mild to moderate nonobstructive coronary artery disease with the exception of an occluded proximal right PDA with left-to-right collaterals. 2.  Left ventricular angiography was not performed.  EF was moderately to severely reduced by echo. 3.  Right heart catheterization showed mildly elevated left-sided filling pressures, mild pulmonary hypertension and normal cardiac output.   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 His coronary arteries are heavily calcified but with no obstructive disease and does not require revascularization.  Recommend aggressive medical therapy.  TTE 04/19/2022 1. Left ventricular ejection fraction, by estimation, is 30 to 35%. The  left ventricle has moderate to severely decreased function. The left  ventricle demonstrates global hypokinesis. There is mild left ventricular  hypertrophy. Left ventricular  diastolic parameters are indeterminate.   2. Right ventricular systolic function is mildly reduced. The right  ventricular size is moderately enlarged. There is mildly elevated  pulmonary artery systolic pressure.   3. Right atrial size was severely dilated.   4. The mitral valve is normal in structure. Mild mitral valve  regurgitation.   5. Tricuspid valve regurgitation is moderate.   6. The aortic valve is tricuspid. Aortic valve regurgitation is mild.  Aortic valve sclerosis/calcification is present, without any evidence of  aortic stenosis.   7. Aortic dilatation noted. There is mild dilatation of the aortic root,  measuring 40 mm.   8. The inferior vena cava is normal in size with <50% respiratory  variability, suggesting right atrial pressure of 8 mmHg.   Patient Profile     82 y.o. male  with history of permanent atrial fibrillation on Xarelto, CVA, HFrEF, AAA 5.1 cm being seen for A-fib RVR and preop evaluation  Assessment & Plan    Permanent A-fib with RVR -Patient is still tachycardic -Toprol-XL switched to Lopressor.   -Start Lopressor 50 mg every 6, Xarelto. -Start digoxin, check dig levels in a.m. -Avoiding Cardizem with reduced EF.  2.  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 -Possibly tach induced -Management of A-fib as above. -Lasix 40 mg x 1, 20 mg daily starting tomorrow. -Consider adding ACE/ARB as BP permits after adequate up titration of beta-blocker/heart rate control.  3.  Preop evaluation, abdominal aortic aneurysm -Patient is moderate risk -Endovascular repair as per vascular surgery after adequate heart rate control.  Total encounter time more than 50 minutes  Greater than 50% was spent in counseling and coordination of care with the patient     Signed, Debbe Odea, MD  04/21/2022, 11:57 AM

## 2022-04-21 NOTE — Care Management Important Message (Signed)
Important Message  Patient Details  Name: Gary Dawson MRN: [id number] Date of Birth: [birth year]   Medicare Important Message Given:  Yes     [name] 04/21/2022, 12:48 PM

## 2022-04-21 NOTE — Progress Notes (Signed)
PT Cancellation Note  Patient Details Name: Gary Dawson MRN: [id number] DOB: 28-Aug-1940   Cancelled Treatment:    Reason Eval/Treat Not Completed: Medical issues which prohibited therapy (Per tele, rates remain in AF ranging from 100s to 130s. Will continue to follow, resume services once VSS.)  3:11 PM, 04/21/22 Rosamaria Lints, PT, DPT Physical Therapist - [facility]  [phone] (ASCOM)    [name] 04/21/2022, 3:10 PM

## 2022-04-21 NOTE — TOC Initial Note (Signed)
Transition of Care [facility]) - Initial/Assessment Note    Patient Details  Name: Gary Dawson MRN: [id number] Date of Birth: Jan 28, 1940  [facility]) CM/SW Contact:    Truddie Hidden, RN Phone Number: 04/21/2022, 3:42 PM  Clinical Narrative:                 Spoke with patient regarding SNF. He is agreeable to SNF. He would like a list to review. Medicare. Gov list provided. Reviewed list of local facilities.  He would prefer to communicate with his brother if needed.  [place] PASSR obtained.  FL2 completed. Bed search started.         Patient Goals and CMS Choice            Expected Discharge Plan and Services                                              Prior Living Arrangements/Services                       Activities of Daily Living Home Assistive Devices/Equipment: Cane (specify quad or straight) ADL Screening (condition at time of admission) Patient's cognitive ability adequate to safely complete daily activities?: Yes Is the patient deaf or have difficulty hearing?: No Does the patient have difficulty seeing, even when wearing glasses/contacts?: No Does the patient have difficulty concentrating, remembering, or making decisions?: No Patient able to express need for assistance with ADLs?: Yes Does the patient have difficulty dressing or bathing?: No Independently performs ADLs?: Yes (appropriate for developmental age) Does the patient have difficulty walking or climbing stairs?: No Weakness of Legs: None Weakness of Arms/Hands: None  Permission Sought/Granted                  Emotional Assessment              Admission diagnosis:  Atrial fibrillation with rapid ventricular response [I48.91] Atrial fibrillation with RVR [I48.91] Abdominal aortic aneurysm (AAA) without rupture, unspecified part [I71.40] Patient Active Problem List   Diagnosis Date Noted   Abdominal aortic aneurysm (AAA) without rupture  04/21/2022   Acute on chronic systolic (congestive) heart failure 04/20/2022   NICM (nonischemic cardiomyopathy) 04/19/2022   Atrial fibrillation with rapid ventricular response 04/18/2022   Infrarenal abdominal aortic aneurysm (AAA) without rupture 04/18/2022   Weakness 04/18/2022   OSA on CPAP 04/18/2022   Hindfoot pronation of right foot 04/18/2021   Neuropathy 04/15/2021   Chronic pain of right ankle 05/26/2019   Torticollis 12/15/2016   Encounter for anticoagulation discussion and counseling 11/13/2014   Increased frequency of urination 11/04/2013   PIN III (prostatic intraepithelial neoplasm III) 11/04/2013   Sleep disturbance 11/04/2013   Pure hypercholesterolemia 05/22/2013   Benign essential hypertension 05/08/2013   Hyperlipidemia 05/08/2013   Shortness of breath 05/08/2013   Permanent atrial fibrillation 05/06/2013   CVA (cerebral vascular accident) 05/06/2013   Benign localized prostatic hyperplasia with lower urinary tract symptoms (LUTS) 03/06/2012   Calculus in bladder 03/06/2012   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   Elevated prostate specific antigen (PSA) 03/06/2012   Incomplete emptying of bladder 03/06/2012   Kidney stone 03/06/2012   Microscopic hematuria 03/06/2012   Sleep apnea in adult 08/27/2003   Enlarged prostate 08/26/1973   Irregular heart beat 08/26/1973   PCP:  Gracelyn Nurse, MD Pharmacy:  [facility] - [place], [place] - [address] [facility] Portal to Registered [facility] [address] [place] [address] Phone: [phone] Fax: [phone]  [facility] [phone] [place], [place] - [address] [place] [address] Phone: [phone] Fax: [phone]     Social Determinants of Health (SDOH) Social History: SDOH Screenings   Food Insecurity: No Food Insecurity (04/19/2022)  Housing: Low Risk  (04/19/2022)  Transportation Needs: No Transportation Needs (04/19/2022)  Utilities: Not At  Risk (04/19/2022)  Tobacco Use: Low Risk  (04/21/2022)   SDOH Interventions:     Readmission Risk Interventions     No data to display

## 2022-04-22 DIAGNOSIS — I4891 Unspecified atrial fibrillation: Secondary | ICD-10-CM | POA: Diagnosis not present

## 2022-04-22 DIAGNOSIS — R531 Weakness: Secondary | ICD-10-CM | POA: Diagnosis not present

## 2022-04-22 DIAGNOSIS — I4821 Permanent atrial fibrillation: Secondary | ICD-10-CM | POA: Diagnosis not present

## 2022-04-22 DIAGNOSIS — I7143 Infrarenal abdominal aortic aneurysm, without rupture: Secondary | ICD-10-CM | POA: Diagnosis not present

## 2022-04-22 LAB — BASIC METABOLIC PANEL
Anion gap: 9 (ref 5–15)
BUN: 23 mg/dL (ref 8–23)
CO2: 24 mmol/L (ref 22–32)
Calcium: 8.5 mg/dL — ABNORMAL LOW (ref 8.9–10.3)
Chloride: 104 mmol/L (ref 98–111)
Creatinine, Ser: 0.81 mg/dL (ref 0.61–1.24)
GFR, Estimated: >60 mL/min (ref >60)
Glucose, Bld: 87 mg/dL (ref 70–99)
Potassium: 3.8 mmol/L (ref 3.5–5.1)
Sodium: 137 mmol/L (ref 135–145)

## 2022-04-22 LAB — CBC
HCT: 35.9 % — ABNORMAL LOW (ref 39.0–52.0)
Hemoglobin: 12.1 g/dL — ABNORMAL LOW (ref 13.0–17.0)
MCH: 33.6 pg (ref 26.0–34.0)
MCHC: 33.7 g/dL (ref 30.0–36.0)
MCV: 99.7 fL (ref 80.0–100.0)
Platelets: 189 10*3/uL (ref 150–400)
RBC: 3.6 MIL/uL — ABNORMAL LOW (ref 4.22–5.81)
RDW: 12.8 % (ref 11.5–15.5)
WBC: 5.7 10*3/uL (ref 4.0–10.5)
nRBC: 0 % (ref 0.0–0.2)

## 2022-04-22 LAB — DIGOXIN LEVEL: Digoxin Level: 0.2 ng/mL — ABNORMAL LOW (ref 0.8–2.0)

## 2022-04-22 MED ORDER — DAPAGLIFLOZIN PROPANEDIOL 10 MG PO TABS
10.0000 mg | ORAL_TABLET | Freq: Every day | ORAL | Status: DC
  Administered 2022-04-23 – 2022-04-26 (×4): 10 mg via ORAL
  Filled 2022-04-22 (×4): qty 1

## 2022-04-22 NOTE — Progress Notes (Signed)
Rounding Note    Patient Name: Gary Dawson Date of Encounter: 04/22/2022  [facility] Cardiologist: Julien Nordmann, MD   Subjective   Feeling much better today--does not think he is at baseline but feels much better than he did a few days ago. No chest pain. Breathing better but feels that he isn't taking deep breaths.   Inpatient Medications    Scheduled Meds:  aspirin EC  81 mg Oral Daily   atorvastatin  40 mg Oral Daily   [START ON 04/23/2022] dapagliflozin propanediol  10 mg Oral Daily   digoxin  0.25 mg Oral Daily   diltiazem  30 mg Oral QID   lidocaine  2 patch Transdermal Q24H   metoprolol tartrate  50 mg Oral QID   rivaroxaban  20 mg Oral Q supper   sodium chloride flush  3 mL Intravenous Q12H   Continuous Infusions:  sodium chloride     PRN Meds: sodium chloride, acetaminophen **OR** acetaminophen, ondansetron **OR** ondansetron (ZOFRAN) IV, polyvinyl alcohol, senna-docusate, sodium chloride flush, traMADol   Vital Signs    Vitals:   04/21/22 1624 04/21/22 2059 04/21/22 2349 04/22/22 0805  BP: 117/79 118/80 105/74 125/81  Pulse: 72 (!) 106 (!) 110 89  Resp: 20 16 15 18   Temp: 97.8 F (36.6 C) 98.1 F (36.7 C) 97.9 F (36.6 C) 97.6 F (36.4 C)  TempSrc:   Oral   SpO2: 98% 97% 98% 99%  Weight:      Height:        Intake/Output Summary (Last 24 hours) at 04/22/2022 1111 Last data filed at 04/22/2022 1025 Gross per 24 hour  Intake 1320 ml  Output 2250 ml  Net -930 ml      04/21/2022    4:41 AM 04/20/2022    4:54 AM 04/19/2022   12:09 PM  Last 3 Weights  Weight (lbs) 188 lb 0.8 oz 193 lb 5.5 oz 194 lb 0.1 oz  Weight (kg) 85.3 kg 87.7 kg 88 kg      Telemetry    Atrial fibrillation, rates 90s-120s, average ~100 - Personally Reviewed  ECG    No new since 4/2 - Personally Reviewed  Physical Exam   GEN: Well nourished, well developed in no acute distress HEENT: Normal, moist mucous membranes NECK: No JVD CARDIAC: irregularly  irregular rhythm, normal S1 and S2, no rubs or gallops. No murmur. VASCULAR: Radial and DP pulses 2+ bilaterally. No carotid bruits RESPIRATORY:  Clear to auscultation without rales, wheezing or rhonchi  ABDOMEN: Soft, non-tender, non-distended MUSCULOSKELETAL:  Ambulates independently SKIN: Warm and dry, no edema NEUROLOGIC:  No focal neuro deficits noted. PSYCHIATRIC:  Normal affect    Labs    High Sensitivity Troponin:   Recent Labs  Lab 04/18/22 1446 04/19/22 1408  TROPONINIHS 19* 16     Chemistry Recent Labs  Lab 04/18/22 1446 04/19/22 0512 04/20/22 1414 04/20/22 1417 04/22/22 0639  NA 137 138 138 137 137  K 4.1 3.7 4.1 4.1 3.8  CL 102 105  --   --  104  CO2 25 24  --   --  24  GLUCOSE 112* 104*  --   --  87  BUN 27* 23  --   --  23  CREATININE 0.78 0.72  --   --  0.81  CALCIUM 8.9 8.4*  --   --  8.5*  MG  --  2.0  --   --   --   PROT 7.8  --   --   --   --  ALBUMIN 3.8  --   --   --   --   AST 63*  --   --   --   --   ALT 34  --   --   --   --   ALKPHOS 56  --   --   --   --   BILITOT 1.8*  --   --   --   --   GFRNONAA >60 >60  --   --  >60  ANIONGAP 10 9  --   --  9    Lipids No results for input(s): "CHOL", "TRIG", "HDL", "LABVLDL", "LDLCALC", "CHOLHDL" in the last 168 hours.  Hematology Recent Labs  Lab 04/20/22 0711 04/20/22 1414 04/20/22 1417 04/21/22 0428 04/22/22 0639  WBC 5.6  --   --  5.4 5.7  RBC 3.58*  --   --  3.59* 3.60*  HGB 12.1*   < > 11.6* 12.1* 12.1*  HCT 35.8*   < > 34.0* 35.7* 35.9*  MCV 100.0  --   --  99.4 99.7  MCH 33.8  --   --  33.7 33.6  MCHC 33.8  --   --  33.9 33.7  RDW 13.1  --   --  13.0 12.8  PLT 168  --   --  181 189   < > = values in this interval not displayed.   Thyroid No results for input(s): "TSH", "FREET4" in the last 168 hours.  BNPNo results for input(s): "BNP", "PROBNP" in the last 168 hours.  DDimer No results for input(s): "DDIMER" in the last 168 hours.   Radiology    CARDIAC  CATHETERIZATION  Result Date: 04/20/2022   RPDA lesion is 100% stenosed.   1st Diag lesion is 40% stenosed.   Mid LAD to Dist LAD lesion is 30% stenosed. 1.  Heavily calcified coronary arteries with mild to moderate nonobstructive coronary artery disease with the exception of an occluded proximal right PDA with left-to-right collaterals. 2.  Left ventricular angiography was not performed.  EF was moderately to severely reduced by echo. 3.  Right heart catheterization showed mildly elevated left-sided filling pressures, mild pulmonary hypertension and normal cardiac output. 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 His coronary arteries are heavily calcified but with no obstructive disease and does not require revascularization.  Recommend aggressive medical therapy.\ I increased the dose of Toprol for better rate control of atrial fibrillation. Heparin can be resumed at 6 PM and can be transition back to Xarelto if no plans for AAA repair during this admission. The patient is considered at moderate risk from a cardiac standpoint.    Cardiac Studies   LHC 04/20/2022   RPDA lesion is 100% stenosed.   1st Diag lesion is 40% stenosed.   Mid LAD to Dist LAD lesion is 30% stenosed.   1.  Heavily calcified coronary arteries with mild to moderate nonobstructive coronary artery disease with the exception of an occluded proximal right PDA with left-to-right collaterals. 2.  Left ventricular angiography was not performed.  EF was moderately to severely reduced by echo. 3.  Right heart catheterization showed mildly elevated left-sided filling pressures, mild pulmonary hypertension and normal cardiac output.   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 His coronary arteries are heavily calcified but with no obstructive disease and does not require revascularization.  Recommend aggressive medical therapy.   TTE 04/19/2022 1. Left ventricular  ejection fraction, by estimation, is 30 to 35%. The  left ventricle has moderate to severely decreased function. The left  ventricle demonstrates global hypokinesis. There is mild left ventricular  hypertrophy. Left ventricular  diastolic parameters are indeterminate.   2. Right ventricular systolic function is mildly reduced. The right  ventricular size is moderately enlarged. There is mildly elevated  pulmonary artery systolic pressure.   3. Right atrial size was severely dilated.   4. The mitral valve is normal in structure. Mild mitral valve  regurgitation.   5. Tricuspid valve regurgitation is moderate.   6. The aortic valve is tricuspid. Aortic valve regurgitation is mild.  Aortic valve sclerosis/calcification is present, without any evidence of  aortic stenosis.   7. Aortic dilatation noted. There is mild dilatation of the aortic root,  measuring 40 mm.   8. The inferior vena cava is normal in size with <50% respiratory  variability, suggesting right atrial pressure of 8 mmHg.   Patient Profile     82 y.o. 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 Currently ordered for 30 mg four times daily, will stop as soon as rate stabilized -continue digoxin, level 0.2 (low) today. 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 -continue aspirin, atorvastatin -Possibly tachycardia  exacerbated -Management of A-fib as above. -appears euvolemic, change oral furosemide 20 mg daily to SGLT2i tomorrow. -BP low normal today. Once able to discontinue diltiazem, would start entresto   Preop evaluation, abdominal aortic aneurysm -Patient is moderate risk -Endovascular repair as per vascular surgery, per notes this is planned for ~2 weeks, continue Xarelto  Awaiting SNF placement  For questions or updates, please contact [facility] Please consult [email or web address] for contact info under        Signed, Jodelle Red, MD  04/22/2022, 11:11 AM

## 2022-04-22 NOTE — Progress Notes (Signed)
Progress Note   Patient: Gary Dawson NOB:[id number] DOB: 1940-09-23 DOA: 04/18/2022     4 DOS: the patient was seen and examined on 04/22/2022   Brief hospital course: Mr. Con Quinney is a 82 year old male with history of permanent atrial fibrillation on Xarelto, cardiomyopathy, OSA on CPAP, history of hernia repair, history of tonsillectomy, who presents to the emergency department from urgent care center for chief concerns frequent falls and was sent to the ED for altered mental status.  Vitals in the emergency department showed temperature of 99.7, respiration rate 29, heart rate 98, blood pressure 129/104, SpO2 of 96% on room air.  Serum sodium is 137, potassium 4.1, chloride 102, bicarb 25, BUN of 27, serum creatinine 0.78, nonfasting blood glucose 112, EGFR greater than 60, WBC 8.7, hemoglobin 12.6, platelets of 166.  High since troponin is 19.  Lactic acid is 1.7.  CT of the head without contrast and CT cervical spine without contrast: Was read as no evidence of acute intracranial abnormality.  No acute cervical spine fracture.  Mild chronic small vessel ischemic disease and unchanged chronic ventriculomegaly.  CT abdomen pelvis with contrast: Was read as infrarenal saccular abdominal aortic aneurysm measuring up to 5.1 cm in maximum dimension.  Vascular surgical consultation is recommended.  Borderline prominence of gas and stool query mild constipation.  Moderate cardiomegaly with atherosclerotic calcification of the coronary arteries.  5 x 4 mm right middle lobe pulmonary nodule, no follow-up imaging of this nodule recommended.  Aortic and coronary atherosclerosis.  2.3 x 2.0 cm exophytic hypodense lesion in the right kidney and upper pole with internal density of 14 Hounsfield units.  No follow-up of this lesion is recommended.  CT L-spine: Was read as no acute osseous abnormality identified in the lumbar spine.  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  4/3: Vital stable.  Heart rate 80.  Cardiology and vascular surgery on board.  Patient had high risk aneurysm which need urgent intervention, will need a cardiac cath and goals of care discussion before that.  B12 elevated above 2500. Cardizem infusion was discontinued and patient was started on metoprolol. Echocardiogram with further decreased LVEF to 30 to 35%.  It was 45% on prior echo. Patient was started on heparin infusion while holding Xarelto and most likely will be getting his cardiac cath on Thursday afternoon or Friday morning.  4/4: Hemodynamically stable with well-controlled heart rate this morning.  Awaiting cardiac catheterization.  4/5: Heart rate remained mildly elevated in low 100s.  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  EF was moderately to severely reduced by echo.  Right heart cath shows mildly elevated left filling pressure, mild pulmonary hypertension and normal cardiac output.. Metoprolol dose was increased and they placed him as moderate risk for surgical interventions.  Cardiology also started him on digoxin. Vascular surgery will do procedure in 2 weeks-heparin is being switched with Xarelto  PT is recommending SNF.  TOC consult placed.  4/6: Hemodynamically stable.  Labs stable.  Awaiting SNF now.  Assessment and Plan: * Atrial fibrillation with rapid ventricular response  TSH level checked on 02/08/2022 and the value was 1.50 UA done was negative for leukocytes and nitrites and bacteria No clinical signs for infectious etiology at this time  S/P Cardizem infusion which has been no switched with metoprolol. Echocardiogram with worsening of EF so home Cardizem was discontinued. Has  remained in RVR despite increasing the dose of metoprolol Cardiology added digoxin. -Continue to monitor -Heparin now switched to Xarelto  Infrarenal abdominal aortic aneurysm (AAA) without rupture Infrarenal saccular abdominal aortic aneurysm measuring up to 5.1 cm in maximum dimension Vascular surgeon, Dr. Wyn Quaker has been consulted and they will plan a repair in 2 weeks as outpatient  Weakness Generalized weakness Query deconditioning however given patient's age, B12 elevated Can be due to worsening cardiomyopathy. -PT/OT are recommending -SNF as he lives alone and will require supervision  Permanent atrial fibrillation -Continue home metoprolol-will need titration of dose -Home Cardizem was discontinued by cardiology due to concern of worsening cardiomyopathy -Home Xarelto is temporarily being converted with heparin  NICM (nonischemic cardiomyopathy) Echocardiogram today with further decrease in his EF as compared to the prior done in 2022.  Apparently he also has an high risk stress test and never had left heart catheterization. 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  Concern of secondary to tachycardia. -Cardiology added Lasix -We will consider adding ACE/ARB as blood pressure permits after adequate up titration of beta-blocker/heart rate control  OSA on CPAP CPAP nightly ordered  Hyperlipidemia -Continue Lipitor  Benign essential hypertension Blood pressure currently within goal. -Continue metoprolol -Home Cardizem was discontinued -Most likely he will be started on Entresto by cardiology  CVA (cerebral vascular accident) Patient has an history of CVA.  No acute concerns -Continue aspirin and Lipitor   Subjective: Patient was seen and examined today.  No new complaint.  Physical Exam: Vitals:   04/21/22 2059 04/21/22 2349 04/22/22 0805 04/22/22 1118  BP: 118/80 105/74 125/81 113/62  Pulse: (!) 106 (!)  110 89 (!) 107  Resp: 16 15 18 18   Temp: 98.1 F (36.7 C) 97.9 F (36.6 C) 97.6 F (36.4 C) (!) 97.5 F (36.4 C)  TempSrc:  Oral    SpO2: 97% 98% 99% 99%  Weight:      Height:       General.  Frail elderly man, in no acute distress. Pulmonary.  Lungs clear bilaterally, normal respiratory effort. CV.  Irregularly irregular. Abdomen.  Soft, nontender, nondistended, BS positive. CNS.  Alert and oriented .  No focal neurologic deficit. Extremities.  No edema, no cyanosis, pulses intact and symmetrical. Psychiatry.  Judgment and insight appears normal.   Data Reviewed: Prior data reviewed  Family Communication: Discussed with patient  Disposition: Status is: Inpatient Remains inpatient appropriate because: Severity of illness  Planned Discharge Destination: Home with Home Health  DVT prophylaxis.  Heparin Time spent: 40 minutes  This record has been created using Conservation officer, historic buildings. Errors have been sought and corrected,but may not always be located. Such creation errors do not reflect on the standard of care.   Author: Arnetha Courser, MD 04/22/2022 3:19 PM  For on call review [email or web address].

## 2022-04-23 DIAGNOSIS — I4891 Unspecified atrial fibrillation: Secondary | ICD-10-CM | POA: Diagnosis not present

## 2022-04-23 DIAGNOSIS — I4821 Permanent atrial fibrillation: Secondary | ICD-10-CM | POA: Diagnosis not present

## 2022-04-23 DIAGNOSIS — R531 Weakness: Secondary | ICD-10-CM | POA: Diagnosis not present

## 2022-04-23 DIAGNOSIS — I7143 Infrarenal abdominal aortic aneurysm, without rupture: Secondary | ICD-10-CM | POA: Diagnosis not present

## 2022-04-23 LAB — BASIC METABOLIC PANEL
Anion gap: 6 (ref 5–15)
BUN: 20 mg/dL (ref 8–23)
CO2: 27 mmol/L (ref 22–32)
Calcium: 8.6 mg/dL — ABNORMAL LOW (ref 8.9–10.3)
Chloride: 105 mmol/L (ref 98–111)
Creatinine, Ser: 0.94 mg/dL (ref 0.61–1.24)
GFR, Estimated: >60 mL/min (ref >60)
Glucose, Bld: 89 mg/dL (ref 70–99)
Potassium: 3.9 mmol/L (ref 3.5–5.1)
Sodium: 138 mmol/L (ref 135–145)

## 2022-04-23 MED ORDER — METOPROLOL SUCCINATE ER 100 MG PO TB24: 100.0000 mg | ORAL_TABLET | Freq: Two times a day (BID) | ORAL | Status: DC

## 2022-04-23 MED ORDER — METOPROLOL SUCCINATE ER 100 MG PO TB24
100.0000 mg | ORAL_TABLET | Freq: Two times a day (BID) | ORAL | Status: DC
  Administered 2022-04-23 – 2022-04-26 (×6): 100 mg via ORAL
  Filled 2022-04-23 (×6): qty 1

## 2022-04-23 MED ORDER — METOPROLOL SUCCINATE ER 50 MG PO TB24: 50.0000 mg | ORAL_TABLET | Freq: Four times a day (QID) | ORAL | Status: DC

## 2022-04-23 MED ORDER — METOPROLOL SUCCINATE ER 50 MG PO TB24
50.0000 mg | ORAL_TABLET | Freq: Two times a day (BID) | ORAL | Status: DC
  Administered 2022-04-23: 50 mg via ORAL
  Filled 2022-04-23: qty 1

## 2022-04-23 NOTE — Progress Notes (Signed)
Physical Therapy Treatment Patient Details Name: Gary Dawson MRN: [id number] DOB: August 07, 1940 Today's Date: 04/23/2022   History of Present Illness Pt is 82 y/o male with PMH including a-fib, CVA, OSA, weakness. Pt admitted with increasing weakness, back pain, two recent falls per medical record.    PT Comments    Pt seen for mobility progression; however, limited this session secondary to significantly elevated HR from resting baseline with very minimal activity. Pt stood from recliner chair x2 with use of RW and PT providing supervision. In static standing, pt's HR increasing to as high as 168 bpm with slow return to 115-130 bpm, which is his baseline at rest. PT demonstrated and instructed pt in bed/chair level HEP (see below). Pt would continue to benefit from skilled physical therapy services at this time while admitted and after d/c to address the below listed limitations in order to improve overall safety and independence with functional mobility.   Recommendations for follow up therapy are one component of a multi-disciplinary discharge planning process, led by the attending physician.  Recommendations may be updated based on patient status, additional functional criteria and insurance authorization.  Follow Up Recommendations  Can patient physically be transported by private vehicle: Yes    Assistance Recommended at Discharge Intermittent Supervision/Assistance  Patient can return home with the following A little help with walking and/or transfers;A little help with bathing/dressing/bathroom;Assist for transportation;Assistance with cooking/housework;Help with stairs or ramp for entrance   Equipment Recommendations  Rollator (4 wheels)    Recommendations for Other Services       Precautions / Restrictions Precautions Precautions: Fall Precaution Comments: monitor HR Restrictions Weight Bearing Restrictions: No     Mobility  Bed Mobility                General bed mobility comments: pt OOB in recliner chair upon PT arrival    Transfers Overall transfer level: Needs assistance Equipment used: Rolling walker (2 wheels) Transfers: Sit to/from Stand Sit to Stand: Supervision           General transfer comment: pt stood from recliner chair x2 with supervision and use of RW, good technique utilized as well    Ambulation/Gait               General Gait Details: deferred secondary to significant increase in HR with standing   Stairs             Wheelchair Mobility    Modified Rankin (Stroke Patients Only)       Balance Overall balance assessment: Needs assistance Sitting-balance support: Feet supported Sitting balance-Leahy Scale: Good     Standing balance support: Bilateral upper extremity supported, During functional activity, Reliant on assistive device for balance Standing balance-Leahy Scale: Poor                              Cognition Arousal/Alertness: Awake/alert Behavior During Therapy: WFL for tasks assessed/performed Overall Cognitive Status: No family/caregiver present to determine baseline cognitive functioning Area of Impairment: Memory, Attention                   Current Attention Level: Sustained Memory: Decreased short-term memory                  Exercises Other Exercises Other Exercises: PT demonstrated and instructed pt in bed/chair level HEP per his request, including: ankle pumps, LAQs, seated marches, glute squeezes, supine glute bridges, supine hip abd/add,  supine SLRs    General Comments        Pertinent Vitals/Pain Pain Assessment Pain Assessment: No/denies pain    Home Living                          Prior Function            PT Goals (current goals can now be found in the care plan section) Acute Rehab PT Goals PT Goal Formulation: With patient Time For Goal Achievement: 05/03/22 Potential to Achieve Goals: Good Progress  towards PT goals: Progressing toward goals    Frequency    Min 3X/week      PT Plan Current plan remains appropriate    Co-evaluation              AM-PAC PT "6 Clicks" Mobility   Outcome Measure  Help needed turning from your back to your side while in a flat bed without using bedrails?: None Help needed moving from lying on your back to sitting on the side of a flat bed without using bedrails?: A Little Help needed moving to and from a bed to a chair (including a wheelchair)?: A Little Help needed standing up from a chair using your arms (e.g., wheelchair or bedside chair)?: A Little Help needed to walk in hospital room?: A Little Help needed climbing 3-5 steps with a railing? : A Lot 6 Click Score: 18    End of Session   Activity Tolerance: Treatment limited secondary to medical complications (Comment);Other (comment) (significantly increased HR) Patient left: in chair;with call bell/phone within reach;with chair alarm set Nurse Communication: Mobility status PT Visit Diagnosis: Unsteadiness on feet (R26.81);Other abnormalities of gait and mobility (R26.89);Muscle weakness (generalized) (M62.81);Difficulty in walking, not elsewhere classified (R26.2)     Time: 7824-2353 PT Time Calculation (min) (ACUTE ONLY): 43 min  Charges:  $Therapeutic Activity: 23-37 mins $Self Care/Home Management: 8-22                     Gary Dawson, DPT  Acute Rehabilitation Services Office [phone]    Gary Dawson Gary Dawson 04/23/2022, 12:40 PM

## 2022-04-23 NOTE — Progress Notes (Signed)
Rounding Note    Patient Name: Gary Dawson Date of Encounter: 04/23/2022  [facility] Cardiologist: Julien Nordmann, MD   Subjective   HR improved in afib. Patient is overall feeling well.   Inpatient Medications    Scheduled Meds:  aspirin EC  81 mg Oral Daily   atorvastatin  40 mg Oral Daily   dapagliflozin propanediol  10 mg Oral Daily   digoxin  0.25 mg Oral Daily   diltiazem  30 mg Oral QID   lidocaine  2 patch Transdermal Q24H   metoprolol tartrate  50 mg Oral QID   rivaroxaban  20 mg Oral Q supper   sodium chloride flush  3 mL Intravenous Q12H   Continuous Infusions:  sodium chloride     PRN Meds: sodium chloride, acetaminophen **OR** acetaminophen, ondansetron **OR** ondansetron (ZOFRAN) IV, polyvinyl alcohol, senna-docusate, sodium chloride flush, traMADol   Vital Signs    Vitals:   04/22/22 1538 04/22/22 1954 04/22/22 2356 04/23/22 0600  BP: (!) 95/55 (!) 100/58 117/78 101/61  Pulse: 94 77 71 82  Resp: 14 17 18 16   Temp: (!) 97.5 F (36.4 C) 98.4 F (36.9 C) 98.4 F (36.9 C) 97.8 F (36.6 C)  TempSrc:  Oral Oral   SpO2: 100% 98% 98% 98%  Weight:      Height:        Intake/Output Summary (Last 24 hours) at 04/23/2022 0843 Last data filed at 04/23/2022 0300 Gross per 24 hour  Intake 407.5 ml  Output 2400 ml  Net -1992.5 ml      04/21/2022    4:41 AM 04/20/2022    4:54 AM 04/19/2022   12:09 PM  Last 3 Weights  Weight (lbs) 188 lb 0.8 oz 193 lb 5.5 oz 194 lb 0.1 oz  Weight (kg) 85.3 kg 87.7 kg 88 kg      Telemetry    Afib HR 70-80s, rare pause around 2 seconds - Personally Reviewed  ECG    No new - Personally Reviewed  Physical Exam   GEN: No acute distress.   Neck: No JVD Cardiac: Irreg Irreg, no murmurs, rubs, or gallops.  Respiratory: Clear to auscultation bilaterally. GI: Soft, nontender, non-distended  MS: No edema; No deformity. Neuro:  Nonfocal  Psych: Normal affect   Labs    High Sensitivity Troponin:    Recent Labs  Lab 04/18/22 1446 04/19/22 1408  TROPONINIHS 19* 16     Chemistry Recent Labs  Lab 04/18/22 1446 04/19/22 0512 04/20/22 1414 04/20/22 1417 04/22/22 0639 04/23/22 0613  NA 137 138   < > 137 137 138  K 4.1 3.7   < > 4.1 3.8 3.9  CL 102 105  --   --  104 105  CO2 25 24  --   --  24 27  GLUCOSE 112* 104*  --   --  87 89  BUN 27* 23  --   --  23 20  CREATININE 0.78 0.72  --   --  0.81 0.94  CALCIUM 8.9 8.4*  --   --  8.5* 8.6*  MG  --  2.0  --   --   --   --   PROT 7.8  --   --   --   --   --   ALBUMIN 3.8  --   --   --   --   --   AST 63*  --   --   --   --   --  ALT 34  --   --   --   --   --   ALKPHOS 56  --   --   --   --   --   BILITOT 1.8*  --   --   --   --   --   GFRNONAA >60 >60  --   --  >60 >60  ANIONGAP 10 9  --   --  9 6   < > = values in this interval not displayed.    Lipids No results for input(s): "CHOL", "TRIG", "HDL", "LABVLDL", "LDLCALC", "CHOLHDL" in the last 168 hours.  Hematology Recent Labs  Lab 04/20/22 0711 04/20/22 1414 04/20/22 1417 04/21/22 0428 04/22/22 0639  WBC 5.6  --   --  5.4 5.7  RBC 3.58*  --   --  3.59* 3.60*  HGB 12.1*   < > 11.6* 12.1* 12.1*  HCT 35.8*   < > 34.0* 35.7* 35.9*  MCV 100.0  --   --  99.4 99.7  MCH 33.8  --   --  33.7 33.6  MCHC 33.8  --   --  33.9 33.7  RDW 13.1  --   --  13.0 12.8  PLT 168  --   --  181 189   < > = values in this interval not displayed.   Thyroid No results for input(s): "TSH", "FREET4" in the last 168 hours.  BNPNo results for input(s): "BNP", "PROBNP" in the last 168 hours.  DDimer No results for input(s): "DDIMER" in the last 168 hours.   Radiology    No results found.  Cardiac Studies   LHC 04/20/2022   RPDA lesion is 100% stenosed.   1st Diag lesion is 40% stenosed.   Mid LAD to Dist LAD lesion is 30% stenosed.   1.  Heavily calcified coronary arteries with mild to moderate nonobstructive coronary artery disease with the exception of an occluded proximal right PDA  with left-to-right collaterals. 2.  Left ventricular angiography was not performed.  EF was moderately to severely reduced by echo. 3.  Right heart catheterization showed mildly elevated left-sided filling pressures, mild pulmonary hypertension and normal cardiac output.   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 His coronary arteries are heavily calcified but with no obstructive disease and does not require revascularization.  Recommend aggressive medical therapy.   TTE 04/19/2022 1. Left ventricular ejection fraction, by estimation, is 30 to 35%. The  left ventricle has moderate to severely decreased function. The left  ventricle demonstrates global hypokinesis. There is mild left ventricular  hypertrophy. Left ventricular  diastolic parameters are indeterminate.   2. Right ventricular systolic function is mildly reduced. The right  ventricular size is moderately enlarged. There is mildly elevated  pulmonary artery systolic pressure.   3. Right atrial size was severely dilated.   4. The mitral valve is normal in structure. Mild mitral valve  regurgitation.   5. Tricuspid valve regurgitation is moderate.   6. The aortic valve is tricuspid. Aortic valve regurgitation is mild.  Aortic valve sclerosis/calcification is present, without any evidence of  aortic stenosis.   7. Aortic dilatation noted. There is mild dilatation of the aortic root,  measuring 40 mm.   8. The inferior vena cava is normal in size with <50% respiratory  variability, suggesting right atrial pressure of 8 mmHg.     Patient Profile     82 y.o. 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 being seen for A-fib RVR and preop evaluation   Assessment & Plan    Permanent Afib - heart rates improved with digoxin and lopressor - lopressor 50mg  Q6H, can consolidate to Toprol - echo showed reduced LVEF 30-35% - avoid dilt with low EF -  continue digoxin 0.25 mg daily - CHADSVASC of 6, continue Xarelto  NICM EF 30-35% CAD - low EF in the setting of rapid afib, may be tachy-mediated - [facility] showed occluded distal right PDA with collaterals - continue ASA and Lipid therapy - continue Farxiga 10mg  daily - BP limiting GDMT  Pre-op eval for abdominal aortic aneurysm - patient is moderate risk - endovascular repair per vascular surgery - continue Xarelto  Disposition: awaiting SNF placement  For questions or updates, please contact [facility] Please consult [email or web address] for contact info under        Signed, Raelle Chambers David StallH Adileny Delon, PA-C  04/23/2022, 8:43 AM

## 2022-04-23 NOTE — Progress Notes (Signed)
Progress Note   Patient: Gary Dawson EML:[id number] DOB: [birth year] DOA: 04/18/2022     5 DOS: the patient was seen and examined on 04/23/2022   Brief hospital course: [name] is a 82 year old male with history of permanent atrial fibrillation on Xarelto, cardiomyopathy, OSA on CPAP, history of hernia repair, history of tonsillectomy, who presents to the emergency department from urgent care center for chief concerns frequent falls and was sent to the ED for altered mental status.  Vitals in the emergency department showed temperature of 99.7, respiration rate 29, heart rate 98, blood pressure 129/104, SpO2 of 96% on room air.  Serum sodium is 137, potassium 4.1, chloride 102, bicarb 25, BUN of 27, serum creatinine 0.78, nonfasting blood glucose 112, EGFR greater than 60, WBC 8.7, hemoglobin 12.6, platelets of 166.  High since troponin is 19.  Lactic acid is 1.7.  CT of the head without contrast and CT cervical spine without contrast: Was read as no evidence of acute intracranial abnormality.  No acute cervical spine fracture.  Mild chronic small vessel ischemic disease and unchanged chronic ventriculomegaly.  CT abdomen pelvis with contrast: Was read as infrarenal saccular abdominal aortic aneurysm measuring up to 5.1 cm in maximum dimension.  Vascular surgical consultation is recommended.  Borderline prominence of gas and stool query mild constipation.  Moderate cardiomegaly with atherosclerotic calcification of the coronary arteries.  5 x 4 mm right middle lobe pulmonary nodule, no follow-up imaging of this nodule recommended.  Aortic and coronary atherosclerosis.  2.3 x 2.0 cm exophytic hypodense lesion in the right kidney and upper pole with internal density of 14 Hounsfield units.  No follow-up of this lesion is recommended.  CT L-spine: Was read as no acute osseous abnormality identified in the lumbar spine.  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  4/3: Vital stable.  Heart rate 80.  Cardiology and vascular surgery on board.  Patient had high risk aneurysm which need urgent intervention, will need a cardiac cath and goals of care discussion before that.  B12 elevated above 2500. Cardizem infusion was discontinued and patient was started on metoprolol. Echocardiogram with further decreased LVEF to 30 to 35%.  It was 45% on prior echo. Patient was started on heparin infusion while holding Xarelto and most likely will be getting his cardiac cath on Thursday afternoon or Friday morning.  4/4: Hemodynamically stable with well-controlled heart rate this morning.  Awaiting cardiac catheterization.  4/5: Heart rate remained mildly elevated in low 100s.  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  EF was moderately to severely reduced by echo.  Right heart cath shows mildly elevated left filling pressure, mild pulmonary hypertension and normal cardiac output.. Metoprolol dose was increased and they placed him as moderate risk for surgical interventions.  Cardiology also started him on digoxin. Vascular surgery will do procedure in 2 weeks-heparin is being switched with Xarelto  PT is recommending SNF.  TOC consult placed.  4/6: Hemodynamically stable.  Labs stable.  Awaiting SNF now.  4/7: Remained stable with improved heart rate.  Awaiting SNF placement, now had a bed offer-need to get insurance authorization  Assessment and Plan: * Atrial fibrillation with rapid ventricular response  TSH level checked on 02/08/2022 and the value  was 1.50 UA done was negative for leukocytes and nitrites and bacteria No clinical signs for infectious etiology at this time S/P Cardizem  infusion which has been no switched with metoprolol. Echocardiogram with worsening of EF so home Cardizem was discontinued. Has remained in RVR despite increasing the dose of metoprolol Cardiology added digoxin. -Continue to monitor -Heparin now switched to Xarelto  Infrarenal abdominal aortic aneurysm (AAA) without rupture Infrarenal saccular abdominal aortic aneurysm measuring up to 5.1 cm in maximum dimension Vascular surgeon, Dr. Wyn Quaker has been consulted and they will plan a repair in 2 weeks as outpatient  Weakness Generalized weakness Query deconditioning however given patient's age, B12 elevated Can be due to worsening cardiomyopathy. -PT/OT are recommending -SNF as he lives alone and will require supervision  Permanent atrial fibrillation -Continue home metoprolol-will need titration of dose -Home Cardizem was discontinued by cardiology due to concern of worsening cardiomyopathy -Home Xarelto is temporarily being converted with heparin  NICM (nonischemic cardiomyopathy) Echocardiogram today with further decrease in his EF as compared to the prior done in 2022.  Apparently he also has an high risk stress test and never had left heart catheterization. 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  Concern of secondary to tachycardia. -Cardiology added Lasix -We will consider adding ACE/ARB as blood pressure permits after adequate up titration of beta-blocker/heart rate control  OSA on CPAP CPAP nightly ordered  Hyperlipidemia -Continue Lipitor  Benign essential hypertension Blood pressure currently within goal. -Continue metoprolol -Home Cardizem was discontinued -Most likely he will be started on Entresto by cardiology  CVA (cerebral vascular accident) Patient has an history of CVA.  No acute concerns -Continue aspirin and Lipitor   Subjective: Patient with no new complaint today.  He was excited to go to  [facility] stating that his sister lives there.  Physical Exam: Vitals:   04/22/22 2356 04/23/22 0600 04/23/22 0708 04/23/22 1130  BP: 117/78 101/61 105/60 116/72  Pulse: 71 82  100  Resp: 18 16 18 18   Temp: 98.4 F (36.9 C) 97.8 F (36.6 C) (!) 97.5 F (36.4 C) 97.9 F (36.6 C)  TempSrc: Oral  Oral Oral  SpO2: 98% 98% 99% 99%  Weight:      Height:       General.  Frail elderly man, in no acute distress. Pulmonary.  Lungs clear bilaterally, normal respiratory effort. CV.  Regular rate and rhythm, no JVD, rub or murmur. Abdomen.  Soft, nontender, nondistended, BS positive. CNS.  Alert and oriented .  No focal neurologic deficit. Extremities.  No edema, no cyanosis, pulses intact and symmetrical. Psychiatry.  Judgment and insight appears normal.   Data Reviewed: Prior data reviewed  Family Communication: Discussed with patient  Disposition: Status is: Inpatient Remains inpatient appropriate because: Severity of illness  Planned Discharge Destination: Home with Home Health  DVT prophylaxis.  Heparin Time spent: 40 minutes  This record has been created using Conservation officer, historic buildings. Errors have been sought and corrected,but may not always be located. Such creation errors do not reflect on the standard of care.   Author: Arnetha Courser, MD 04/23/2022 1:25 PM  For on call review [email or web address].

## 2022-04-23 NOTE — TOC Progression Note (Addendum)
Transition of Care [facility]) - Progression Note    Patient Details  Name: Gary Dawson MRN: [id number] Date of Birth: 1940-02-10  Transition of Care [facility] CM/SW Contact  Bing Quarry, RN Phone Number: 04/23/2022, 10:12 AM  Clinical Narrative: 4/7: [facility] is only STR bed offer so far. List was given per prior CM note, but no preference documented as yet. Spoke with patient regarding choices and he stated his sister is in [facility] (bed only bed offered so far) so that would be a good choice for the whole family. Permission to proceed.  Gabriel Cirri RN CM    [employer] Medicare Authorization needed, notified AC at facility. Gabriel Cirri RN CM         Expected Discharge Plan and Services                                               Social Determinants of Health (SDOH) Interventions SDOH Screenings   Food Insecurity: No Food Insecurity (04/19/2022)  Housing: Low Risk  (04/19/2022)  Transportation Needs: No Transportation Needs (04/19/2022)  Utilities: Not At Risk (04/19/2022)  Tobacco Use: Low Risk  (04/21/2022)    Readmission Risk Interventions     No data to display

## 2022-04-24 ENCOUNTER — Other Ambulatory Visit (HOSPITAL_COMMUNITY): Payer: Self-pay

## 2022-04-24 DIAGNOSIS — I4821 Permanent atrial fibrillation: Secondary | ICD-10-CM | POA: Diagnosis not present

## 2022-04-24 DIAGNOSIS — R531 Weakness: Secondary | ICD-10-CM | POA: Diagnosis not present

## 2022-04-24 DIAGNOSIS — I4891 Unspecified atrial fibrillation: Secondary | ICD-10-CM | POA: Diagnosis not present

## 2022-04-24 DIAGNOSIS — I110 Hypertensive heart disease with heart failure: Secondary | ICD-10-CM | POA: Insufficient documentation

## 2022-04-24 DIAGNOSIS — I7143 Infrarenal abdominal aortic aneurysm, without rupture: Secondary | ICD-10-CM | POA: Diagnosis not present

## 2022-04-24 LAB — BASIC METABOLIC PANEL
Anion gap: 5 (ref 5–15)
BUN: 22 mg/dL (ref 8–23)
CO2: 27 mmol/L (ref 22–32)
Calcium: 8.7 mg/dL — ABNORMAL LOW (ref 8.9–10.3)
Chloride: 106 mmol/L (ref 98–111)
Creatinine, Ser: 1.02 mg/dL (ref 0.61–1.24)
GFR, Estimated: >60 mL/min (ref >60)
Glucose, Bld: 94 mg/dL (ref 70–99)
Potassium: 4.2 mmol/L (ref 3.5–5.1)
Sodium: 138 mmol/L (ref 135–145)

## 2022-04-24 LAB — DIGOXIN LEVEL: Digoxin Level: 0.5 ng/mL — ABNORMAL LOW (ref 0.8–2.0)

## 2022-04-24 MED ORDER — LOSARTAN POTASSIUM 25 MG PO TABS
25.0000 mg | ORAL_TABLET | Freq: Every day | ORAL | Status: DC
  Administered 2022-04-24 – 2022-04-26 (×3): 25 mg via ORAL
  Filled 2022-04-24 (×3): qty 1

## 2022-04-24 NOTE — Care Management Important Message (Signed)
Important Message  Patient Details  Name: Gary Dawson MRN: [id number] Date of Birth: October 18, 1940   Medicare Important Message Given:  Yes     [name] 04/24/2022, 12:00 PM

## 2022-04-24 NOTE — Progress Notes (Signed)
Physical Therapy Treatment Patient Details Name: Gary Dawson MRN: [id number] DOB: 1940-03-26 Today's Date: 04/24/2022   History of Present Illness Pt is 82 y/o male with PMH including a-fib, CVA, OSA, weakness. Pt admitted with increasing weakness, back pain, two recent falls per medical record.    PT Comments    Pt was long sitting in bed upon arrival. He is A and O however poor overall insight of deficits and safety safety awareness observed. He was able to exit L side of bed, stand to RW, and ambulate ~ 100 ft total. HR peaked at 138 BPM. He endorses no symptoms of distress or fatigue. Normal breathing observed throughout. He does need Vcs throughout standing for posture correction. He is still far from his baseline and will greatly benefit from continued skilled PT at DC to maximize his independence and safety with all ADLs.     Recommendations for follow up therapy are one component of a multi-disciplinary discharge planning process, led by the attending physician.  Recommendations may be updated based on patient status, additional functional criteria and insurance authorization.     Assistance Recommended at Discharge Intermittent Supervision/Assistance  Patient can return home with the following A little help with walking and/or transfers;A little help with bathing/dressing/bathroom;Assist for transportation;Assistance with cooking/housework;Help with stairs or ramp for entrance   Equipment Recommendations  Other (comment) (defer to next level of care)       Precautions / Restrictions Precautions Precautions: Fall Precaution Comments: monitor HR Restrictions Weight Bearing Restrictions: No     Mobility  Bed Mobility Overal bed mobility: Needs Assistance Bed Mobility: Supine to Sit  Supine to sit: Supervision   Transfers Overall transfer level: Needs assistance Equipment used: Rolling walker (2 wheels) Transfers: Sit to/from Stand Sit to Stand: Supervision      Ambulation/Gait Ambulation/Gait assistance: Min guard Gait Distance (Feet): 100 Feet Assistive device: Rolling walker (2 wheels) Gait Pattern/deviations: Step-through pattern, Decreased step length - right, Decreased step length - left, Decreased stride length, Shuffle Gait velocity: decreased  General Gait Details: Pt was able to ambulate ~ 100 ft with RW without LOB. HR elevated to 138bpm at max. Pt has poor gait posture. He did correct but was unable to maintain.    Balance Overall balance assessment: Needs assistance Sitting-balance support: Feet supported Sitting balance-Leahy Scale: Good   Standing balance support: Bilateral upper extremity supported, During functional activity, Reliant on assistive device for balance Standing balance-Leahy Scale: Fair        Cognition Arousal/Alertness: Awake/alert Behavior During Therapy: WFL for tasks assessed/performed Overall Cognitive Status: No family/caregiver present to determine baseline cognitive functioning    General Comments: Pt is A and O x 3.               Pertinent Vitals/Pain Pain Assessment Pain Assessment: No/denies pain     PT Goals (current goals can now be found in the care plan section) Acute Rehab PT Goals Patient Stated Goal: to return home Progress towards PT goals: Progressing toward goals    Frequency    Min 3X/week      PT Plan Current plan remains appropriate    AM-PAC PT "6 Clicks" Mobility   Outcome Measure  Help needed turning from your back to your side while in a flat bed without using bedrails?: None Help needed moving from lying on your back to sitting on the side of a flat bed without using bedrails?: A Little Help needed moving to and from a bed to a  chair (including a wheelchair)?: A Little Help needed standing up from a chair using your arms (e.g., wheelchair or bedside chair)?: A Little Help needed to walk in hospital room?: A Little Help needed climbing 3-5 steps with a  railing? : A Lot 6 Click Score: 18    End of Session   Activity Tolerance: Patient tolerated treatment well;Patient limited by fatigue Patient left: in bed;with call bell/phone within reach;with bed alarm set Nurse Communication: Mobility status PT Visit Diagnosis: Unsteadiness on feet (R26.81);Other abnormalities of gait and mobility (R26.89);Muscle weakness (generalized) (M62.81);Difficulty in walking, not elsewhere classified (R26.2)     Time: 9150-5697 PT Time Calculation (min) (ACUTE ONLY): 13 min  Charges:  $Gait Training: 8-22 mins                     Jetta Lout PTA 04/24/22, 3:52 PM

## 2022-04-24 NOTE — Progress Notes (Signed)
Rounding Note    Patient Name: Gary Dawson Gary Dawson Date of Encounter: 04/24/2022   [facility] Cardiologist: Julien Nordmannimothy Gollan, MD   Subjective   He is doing well with no chest pain or shortness of breath.  He has permanent atrial fibrillation.  Ventricular rate improved.  Inpatient Medications    Scheduled Meds:  atorvastatin  40 mg Oral Daily   dapagliflozin propanediol  10 mg Oral Daily   digoxin  0.25 mg Oral Daily   lidocaine  2 patch Transdermal Q24H   losartan  25 mg Oral Daily   metoprolol succinate  100 mg Oral BID   rivaroxaban  20 mg Oral Q supper   sodium chloride flush  3 mL Intravenous Q12H   Continuous Infusions:  sodium chloride     PRN Meds: sodium chloride, polyvinyl alcohol, senna-docusate, sodium chloride flush, traMADol   Vital Signs    Vitals:   04/23/22 1952 04/24/22 0018 04/24/22 0424 04/24/22 0737  BP: 133/86 (!) 141/75 125/84 134/83  Pulse: 97 75 68 80  Resp: 19 16 18 16   Temp: 97.9 F (36.6 C) 98 F (36.7 C) (!) 97.5 F (36.4 C) 97.9 F (36.6 C)  TempSrc: Oral Oral Oral   SpO2: 98% 100% 98% 99%  Weight:      Height:        Intake/Output Summary (Last 24 hours) at 04/24/2022 1102 Last data filed at 04/24/2022 1045 Gross per 24 hour  Intake 440 ml  Output 2250 ml  Net -1810 ml       04/21/2022    4:41 AM 04/20/2022    4:54 AM 04/19/2022   12:09 PM  Last 3 Weights  Weight (lbs) 188 lb 0.8 oz 193 lb 5.5 oz 194 lb 0.1 oz  Weight (kg) 85.3 kg 87.7 kg 88 kg      Telemetry    Afib HR in the 90s in the low 100- Personally Reviewed  ECG    No new - Personally Reviewed  Physical Exam   GEN: No acute distress.   Neck: No JVD Cardiac: Irreg Irreg, no murmurs, rubs, or gallops.  Respiratory: Clear to auscultation bilaterally. GI: Soft, nontender, non-distended  MS: No edema; No deformity. Neuro:  Nonfocal  Psych: Normal affect  Right radial pulse is normal.  No hematoma.  Labs    High Sensitivity Troponin:    Recent Labs  Lab 04/18/22 1446 04/19/22 1408  TROPONINIHS 19* 16      Chemistry Recent Labs  Lab 04/18/22 1446 04/19/22 0512 04/20/22 1414 04/22/22 0639 04/23/22 0613 04/24/22 0624  NA 137 138   < > 137 138 138  K 4.1 3.7   < > 3.8 3.9 4.2  CL 102 105  --  104 105 106  CO2 25 24  --  24 27 27   GLUCOSE 112* 104*  --  87 89 94  BUN 27* 23  --  23 20 22   CREATININE 0.78 0.72  --  0.81 0.94 1.02  CALCIUM 8.9 8.4*  --  8.5* 8.6* 8.7*  MG  --  2.0  --   --   --   --   PROT 7.8  --   --   --   --   --   ALBUMIN 3.8  --   --   --   --   --   AST 63*  --   --   --   --   --   ALT 34  --   --   --   --   --  ALKPHOS 56  --   --   --   --   --   BILITOT 1.8*  --   --   --   --   --   GFRNONAA >60 >60  --  >60 >60 >60  ANIONGAP 10 9  --  9 6 5    < > = values in this interval not displayed.     Lipids No results for input(s): "CHOL", "TRIG", "HDL", "LABVLDL", "LDLCALC", "CHOLHDL" in the last 168 hours.  Hematology Recent Labs  Lab 04/20/22 0711 04/20/22 1414 04/20/22 1417 04/21/22 0428 04/22/22 0639  WBC 5.6  --   --  5.4 5.7  RBC 3.58*  --   --  3.59* 3.60*  HGB 12.1*   < > 11.6* 12.1* 12.1*  HCT 35.8*   < > 34.0* 35.7* 35.9*  MCV 100.0  --   --  99.4 99.7  MCH 33.8  --   --  33.7 33.6  MCHC 33.8  --   --  33.9 33.7  RDW 13.1  --   --  13.0 12.8  PLT 168  --   --  181 189   < > = values in this interval not displayed.    Thyroid No results for input(s): "TSH", "FREET4" in the last 168 hours.  BNPNo results for input(s): "BNP", "PROBNP" in the last 168 hours.  DDimer No results for input(s): "DDIMER" in the last 168 hours.   Radiology    No results found.  Cardiac Studies   LHC 04/20/2022   RPDA lesion is 100% stenosed.   1st Diag lesion is 40% stenosed.   Mid LAD to Dist LAD lesion is 30% stenosed.   1.  Heavily calcified coronary arteries with mild to moderate nonobstructive coronary artery disease with the exception of an occluded proximal right PDA with  left-to-right collaterals. 2.  Left ventricular angiography was not performed.  EF was moderately to severely reduced by echo. 3.  Right heart catheterization showed mildly elevated left-sided filling pressures, mild pulmonary hypertension and normal cardiac output.   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 His coronary arteries are heavily calcified but with no obstructive disease and does not require revascularization.  Recommend aggressive medical therapy.   TTE 04/19/2022 1. Left ventricular ejection fraction, by estimation, is 30 to 35%. The  left ventricle has moderate to severely decreased function. The left  ventricle demonstrates global hypokinesis. There is mild left ventricular  hypertrophy. Left ventricular  diastolic parameters are indeterminate.   2. Right ventricular systolic function is mildly reduced. The right  ventricular size is moderately enlarged. There is mildly elevated  pulmonary artery systolic pressure.   3. Right atrial size was severely dilated.   4. The mitral valve is normal in structure. Mild mitral valve  regurgitation.   5. Tricuspid valve regurgitation is moderate.   6. The aortic valve is tricuspid. Aortic valve regurgitation is mild.  Aortic valve sclerosis/calcification is present, without any evidence of  aortic stenosis.   7. Aortic dilatation noted. There is mild dilatation of the aortic root,  measuring 40 mm.   8. The inferior vena cava is normal in size with <50% respiratory  variability, suggesting right atrial pressure of 8 mmHg.     Patient Profile     82 y.o. 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 Permanent Afib with RVR: Continue rate control.  Toprol was maximized to 100 mg twice daily and he was also started on digoxin which should be continued at 0.25 mg once daily.  Digoxin  level is 0.5 which is acceptable. - CHADSVASC of 6, continue Xarelto  2. 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 Continue Farxiga 10 mg daily. I added losartan 25 mg once daily.  Should consider switching to [facility] as an outpatient if blood pressure allows.  3.  Coronary artery disease: Cardiac catheterization done last week showed heavily calcified coronary arteries with mild to moderate nonobstructive coronary artery disease and an occluded proximal right PDA with left-to-right collaterals.  I recommended medical therapy with no need for revascularization. I discontinued aspirin today given that he is on anticoagulation.  4. Pre-op eval for abdominal aortic aneurysm - patient is moderate risk for endovascular repair. -Recommend holding Xarelto 2 days before planned surgery.  Disposition: awaiting SNF placement. Will arrange for follow-up in our office in 1 to 2 weeks.  Total encounter time more than 50 minutes. Greater than 50% was spent in counseling and coordination of care with the patient.   For questions or updates, please contact [facility] Please consult [email or web address] for contact info under        Signed, Lorine Bears, MD  04/24/2022, 11:02 AM

## 2022-04-24 NOTE — TOC Progression Note (Addendum)
Transition of Care [facility]) - Progression Note    Patient Details  Name: Gary Dawson MRN: [id number] Date of Birth: [birth year]  Transition of Care [facility]) CM/SW Contact  Truddie Hidden, RN Phone Number: 04/24/2022, 9:22 AM  Clinical Narrative:    Attempt to reach Gavin Pound, Scientist, research (physical sciences) at [facility] regarding auth. No answer. Unable to leave a message.   10:22pm Spoke with Gavin Pound in admissions from [facility] to confirm if Berkley Harvey was started. Auth started by facility today.         Expected Discharge Plan and Services                                               Social Determinants of Health (SDOH) Interventions SDOH Screenings   Food Insecurity: No Food Insecurity (04/19/2022)  Housing: Low Risk  (04/19/2022)  Transportation Needs: No Transportation Needs (04/19/2022)  Utilities: Not At Risk (04/19/2022)  Tobacco Use: Low Risk  (04/21/2022)    Readmission Risk Interventions     No data to display

## 2022-04-24 NOTE — Progress Notes (Signed)
Progress Note   Patient: Gary Dawson XNT:[id number] DOB: 11-Jun-1940 DOA: 04/18/2022     6 DOS: the patient was seen and examined on 04/24/2022   Brief hospital course: Mr. Makonnen Leibensperger is a 82 year old male with history of permanent atrial fibrillation on Xarelto, cardiomyopathy, OSA on CPAP, history of hernia repair, history of tonsillectomy, who presents to the emergency department from urgent care center for chief concerns frequent falls and was sent to the ED for altered mental status.  Vitals in the emergency department showed temperature of 99.7, respiration rate 29, heart rate 98, blood pressure 129/104, SpO2 of 96% on room air.  Serum sodium is 137, potassium 4.1, chloride 102, bicarb 25, BUN of 27, serum creatinine 0.78, nonfasting blood glucose 112, EGFR greater than 60, WBC 8.7, hemoglobin 12.6, platelets of 166.  High since troponin is 19.  Lactic acid is 1.7.  CT of the head without contrast and CT cervical spine without contrast: Was read as no evidence of acute intracranial abnormality.  No acute cervical spine fracture.  Mild chronic small vessel ischemic disease and unchanged chronic ventriculomegaly.  CT abdomen pelvis with contrast: Was read as infrarenal saccular abdominal aortic aneurysm measuring up to 5.1 cm in maximum dimension.  Vascular surgical consultation is recommended.  Borderline prominence of gas and stool query mild constipation.  Moderate cardiomegaly with atherosclerotic calcification of the coronary arteries.  5 x 4 mm right middle lobe pulmonary nodule, no follow-up imaging of this nodule recommended.  Aortic and coronary atherosclerosis.  2.3 x 2.0 cm exophytic hypodense lesion in the right kidney and upper pole with internal density of 14 Hounsfield units.  No follow-up of this lesion is recommended.  CT L-spine: Was read as no acute osseous abnormality identified in the lumbar spine.  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  4/3: Vital stable.  Heart rate 80.  Cardiology and vascular surgery on board.  Patient had high risk aneurysm which need urgent intervention, will need a cardiac cath and goals of care discussion before that.  B12 elevated above 2500. Cardizem infusion was discontinued and patient was started on metoprolol. Echocardiogram with further decreased LVEF to 30 to 35%.  It was 45% on prior echo. Patient was started on heparin infusion while holding Xarelto and most likely will be getting his cardiac cath on Thursday afternoon or Friday morning.  4/4: Hemodynamically stable with well-controlled heart rate this morning.  Awaiting cardiac catheterization.  4/5: Heart rate remained mildly elevated in low 100s.  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  EF was moderately to severely reduced by echo.  Right heart cath shows mildly elevated left filling pressure, mild pulmonary hypertension and normal cardiac output.. Metoprolol dose was increased and they placed him as moderate risk for surgical interventions.  Cardiology also started him on digoxin. Vascular surgery will do procedure in 2 weeks-heparin is being switched with Xarelto  PT is recommending SNF.  TOC consult placed.  4/6: Hemodynamically stable.  Labs stable.  Awaiting SNF now.  4/7: Remained stable with improved heart rate.  Awaiting SNF placement, now had a bed offer-need to get insurance authorization.  4/8: Vitals and BMP stable.  Digoxin levels improving but still subtherapeutic at 0.5.  Low-dose losartan was added  by cardiology with consideration to switching to [facility] as outpatient.  Awaiting insurance authorization for SNF.  Assessment and Plan: * Atrial  fibrillation with rapid ventricular response  TSH level checked on 02/08/2022 and the value was 1.50 UA done was negative for leukocytes and nitrites and bacteria No clinical signs for infectious etiology at this time S/P Cardizem infusion which has been no switched with metoprolol. Echocardiogram with worsening of EF so home Cardizem was discontinued. Has remained in RVR despite increasing the dose of metoprolol Cardiology added digoxin. -Continue to monitor -Heparin now switched to Xarelto  Infrarenal abdominal aortic aneurysm (AAA) without rupture Infrarenal saccular abdominal aortic aneurysm measuring up to 5.1 cm in maximum dimension Vascular surgeon, Dr. Wyn Quaker has been consulted and they will plan a repair in 2 weeks as outpatient  Weakness Generalized weakness Query deconditioning however given patient's age, B12 elevated Can be due to worsening cardiomyopathy. -PT/OT are recommending -SNF as he lives alone and will require supervision  Permanent atrial fibrillation -Continue home metoprolol-will need titration of dose -Home Cardizem was discontinued by cardiology due to concern of worsening cardiomyopathy -Home Xarelto is temporarily being converted with heparin  NICM (nonischemic cardiomyopathy) Echocardiogram today with further decrease in his EF as compared to the prior done in 2022.  Apparently he also has an high risk stress test and never had left heart catheterization. 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  Concern of secondary to tachycardia. -Cardiology added Lasix -Low-dose losartan was added today-consideration to switching to [facility] as outpatient  OSA on CPAP CPAP nightly ordered  Hyperlipidemia -Continue Lipitor  Benign essential hypertension Blood pressure currently within goal. -Continue metoprolol -Home Cardizem was discontinued -Most likely he will be started on Entresto by  cardiology  CVA (cerebral vascular accident) Patient has an history of CVA.  No acute concerns -Continue aspirin and Lipitor   Subjective: Patient with no new complaint today.  Physical Exam: Vitals:   04/24/22 0018 04/24/22 0424 04/24/22 0737 04/24/22 1140  BP: (!) 141/75 125/84 134/83 124/77  Pulse: 75 68 80 98  Resp: 16 18 16 18   Temp: 98 F (36.7 C) (!) 97.5 F (36.4 C) 97.9 F (36.6 C) 98.2 F (36.8 C)  TempSrc: Oral Oral    SpO2: 100% 98% 99% 97%  Weight:      Height:       General.  Frail gentleman, in no acute distress. Pulmonary.  Lungs clear bilaterally, normal respiratory effort. CV.  Irregularly irregular Abdomen.  Soft, nontender, nondistended, BS positive. CNS.  Alert and oriented .  No focal neurologic deficit. Extremities.  No edema, no cyanosis, pulses intact and symmetrical. Psychiatry.  Judgment and insight appears normal. .   Data Reviewed: Prior data reviewed  Family Communication: Discussed with patient  Disposition: Status is: Inpatient Remains inpatient appropriate because: Severity of illness  Planned Discharge Destination: Home with Home Health  DVT prophylaxis.  Heparin Time spent: 41 minutes  This record has been created using Conservation officer, historic buildings. Errors have been sought and corrected,but may not always be located. Such creation errors do not reflect on the standard of care.   Author: Arnetha Courser, MD 04/24/2022 2:40 PM  For on call review [email or web address].

## 2022-04-25 DIAGNOSIS — R531 Weakness: Secondary | ICD-10-CM | POA: Diagnosis not present

## 2022-04-25 DIAGNOSIS — I4891 Unspecified atrial fibrillation: Secondary | ICD-10-CM | POA: Diagnosis not present

## 2022-04-25 DIAGNOSIS — I7143 Infrarenal abdominal aortic aneurysm, without rupture: Secondary | ICD-10-CM | POA: Diagnosis not present

## 2022-04-25 DIAGNOSIS — I4821 Permanent atrial fibrillation: Secondary | ICD-10-CM | POA: Diagnosis not present

## 2022-04-25 LAB — BASIC METABOLIC PANEL
Anion gap: 6 (ref 5–15)
BUN: 26 mg/dL — ABNORMAL HIGH (ref 8–23)
CO2: 24 mmol/L (ref 22–32)
Calcium: 8.5 mg/dL — ABNORMAL LOW (ref 8.9–10.3)
Chloride: 107 mmol/L (ref 98–111)
Creatinine, Ser: 0.98 mg/dL (ref 0.61–1.24)
GFR, Estimated: >60 mL/min (ref >60)
Glucose, Bld: 141 mg/dL — ABNORMAL HIGH (ref 70–99)
Potassium: 3.8 mmol/L (ref 3.5–5.1)
Sodium: 137 mmol/L (ref 135–145)

## 2022-04-25 MED ORDER — POTASSIUM CHLORIDE CRYS ER 20 MEQ PO TBCR
40.0000 meq | EXTENDED_RELEASE_TABLET | Freq: Once | ORAL | Status: AC
  Administered 2022-04-25: 40 meq via ORAL
  Filled 2022-04-25: qty 2

## 2022-04-25 NOTE — TOC Progression Note (Signed)
Transition of Care [facility]) - Progression Note    Patient Details  Name: Ziaire Eversman MRN: [id number] Date of Birth: [birth year]  Transition of Care [facility]) CM/SW Contact  Truddie Hidden, RN Phone Number: 04/25/2022, 2:23 PM  Clinical Narrative:    Drucie Opitz still pending approval for [facility]         Expected Discharge Plan and Services                                               Social Determinants of Health (SDOH) Interventions SDOH Screenings   Food Insecurity: No Food Insecurity (04/19/2022)  Housing: Low Risk  (04/19/2022)  Transportation Needs: No Transportation Needs (04/19/2022)  Utilities: Not At Risk (04/19/2022)  Tobacco Use: Low Risk  (04/21/2022)    Readmission Risk Interventions     No data to display

## 2022-04-25 NOTE — Progress Notes (Signed)
Rounding Note    Patient Name: Gary Dawson Date of Encounter: 04/25/2022  [facility] Cardiologist: Julien Nordmannimothy Gollan, MD   Subjective   Afib rates 80-90s. Patient overall feels well. No chest pain or SOB.   Inpatient Medications    Scheduled Meds:  atorvastatin  40 mg Oral Daily   dapagliflozin propanediol  10 mg Oral Daily   digoxin  0.25 mg Oral Daily   lidocaine  2 patch Transdermal Q24H   losartan  25 mg Oral Daily   metoprolol succinate  100 mg Oral BID   rivaroxaban  20 mg Oral Q supper   sodium chloride flush  3 mL Intravenous Q12H   Continuous Infusions:  sodium chloride     PRN Meds: sodium chloride, polyvinyl alcohol, senna-docusate, sodium chloride flush, traMADol   Vital Signs    Vitals:   04/24/22 1942 04/24/22 2341 04/25/22 0354 04/25/22 0754  BP: (!) 102/56 113/84 119/84 108/72  Pulse: 88 (!) 106 95 80  Resp: 17 17 18 16   Temp: 98 F (36.7 C) 97.9 F (36.6 C) 98.1 F (36.7 C) 97.6 F (36.4 C)  TempSrc: Oral Oral Oral Oral  SpO2: 98% 98% 99% 98%  Weight:      Height:        Intake/Output Summary (Last 24 hours) at 04/25/2022 1108 Last data filed at 04/25/2022 1051 Gross per 24 hour  Intake 600 ml  Output 2100 ml  Net -1500 ml      04/21/2022    4:41 AM 04/20/2022    4:54 AM 04/19/2022   12:09 PM  Last 3 Weights  Weight (lbs) 188 lb 0.8 oz 193 lb 5.5 oz 194 lb 0.1 oz  Weight (kg) 85.3 kg 87.7 kg 88 kg      Telemetry    Afib 80-90s - Personally Reviewed  ECG    No new- Personally Reviewed  Physical Exam   GEN: No acute distress.   Neck: No JVD Cardiac: Irreg Irreg, no murmurs, rubs, or gallops.  Respiratory: Clear to auscultation bilaterally. GI: Soft, nontender, non-distended  MS: No edema; No deformity. Neuro:  Nonfocal  Psych: Normal affect   Labs    High Sensitivity Troponin:   Recent Labs  Lab 04/18/22 1446 04/19/22 1408  TROPONINIHS 19* 16     Chemistry Recent Labs  Lab 04/18/22 1446  04/19/22 0512 04/20/22 1414 04/23/22 0613 04/24/22 0624 04/25/22 0535  NA 137 138   < > 138 138 137  K 4.1 3.7   < > 3.9 4.2 3.8  CL 102 105   < > 105 106 107  CO2 25 24   < > 27 27 24   GLUCOSE 112* 104*   < > 89 94 141*  BUN 27* 23   < > 20 22 26*  CREATININE 0.78 0.72   < > 0.94 1.02 0.98  CALCIUM 8.9 8.4*   < > 8.6* 8.7* 8.5*  MG  --  2.0  --   --   --   --   PROT 7.8  --   --   --   --   --   ALBUMIN 3.8  --   --   --   --   --   AST 63*  --   --   --   --   --   ALT 34  --   --   --   --   --   ALKPHOS 56  --   --   --   --   --  BILITOT 1.8*  --   --   --   --   --   GFRNONAA >60 >60   < > >60 >60 >60  ANIONGAP 10 9   < > 6 5 6    < > = values in this interval not displayed.    Lipids No results for input(s): "CHOL", "TRIG", "HDL", "LABVLDL", "LDLCALC", "CHOLHDL" in the last 168 hours.  Hematology Recent Labs  Lab 04/20/22 0711 04/20/22 1414 04/20/22 1417 04/21/22 0428 04/22/22 0639  WBC 5.6  --   --  5.4 5.7  RBC 3.58*  --   --  3.59* 3.60*  HGB 12.1*   < > 11.6* 12.1* 12.1*  HCT 35.8*   < > 34.0* 35.7* 35.9*  MCV 100.0  --   --  99.4 99.7  MCH 33.8  --   --  33.7 33.6  MCHC 33.8  --   --  33.9 33.7  RDW 13.1  --   --  13.0 12.8  PLT 168  --   --  181 189   < > = values in this interval not displayed.   Thyroid No results for input(s): "TSH", "FREET4" in the last 168 hours.  BNPNo results for input(s): "BNP", "PROBNP" in the last 168 hours.  DDimer No results for input(s): "DDIMER" in the last 168 hours.   Radiology    No results found.  Cardiac Studies   LHC 04/20/2022   RPDA lesion is 100% stenosed.   1st Diag lesion is 40% stenosed.   Mid LAD to Dist LAD lesion is 30% stenosed.   1.  Heavily calcified coronary arteries with mild to moderate nonobstructive coronary artery disease with the exception of an occluded proximal right PDA with left-to-right collaterals. 2.  Left ventricular angiography was not performed.  EF was moderately to severely  reduced by echo. 3.  Right heart catheterization showed mildly elevated left-sided filling pressures, mild pulmonary hypertension and normal cardiac output.   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 His coronary arteries are heavily calcified but with no obstructive disease and does not require revascularization.  Recommend aggressive medical therapy.   TTE 04/19/2022 1. Left ventricular ejection fraction, by estimation, is 30 to 35%. The  left ventricle has moderate to severely decreased function. The left  ventricle demonstrates global hypokinesis. There is mild left ventricular  hypertrophy. Left ventricular  diastolic parameters are indeterminate.   2. Right ventricular systolic function is mildly reduced. The right  ventricular size is moderately enlarged. There is mildly elevated  pulmonary artery systolic pressure.   3. Right atrial size was severely dilated.   4. The mitral valve is normal in structure. Mild mitral valve  regurgitation.   5. Tricuspid valve regurgitation is moderate.   6. The aortic valve is tricuspid. Aortic valve regurgitation is mild.  Aortic valve sclerosis/calcification is present, without any evidence of  aortic stenosis.   7. Aortic dilatation noted. There is mild dilatation of the aortic root,  measuring 40 mm.   8. The inferior vena cava is normal in size with <50% respiratory  variability, suggesting right atrial pressure of 8 mmHg.   Patient Profile     82 y.o. 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  Assessment & Plan    Permanent Afib - rates better controlled in the 80s - continue Toprol 100mg  BID - digoxin 0.25mg  daily - CHADSVASC at  least 6, continue Xarelto  Acute on chronic systolic heart failure NICM LVEF 30-35% - worsening LVSF felt to be from tachycardia - continue [place] 10mg  daily -  Losartan>switch to [employer] as OP - euvolemic on exam  CAD - cardiac cath showed heavily calcified coronary arteries with mild to moderate nonobstructive CAD and an occluded proximal right PDA with left to right collaterals. Medical therapy recommended - No ASA given Xarelto  Pre-op eval for abdominal aortic aneurysm - patient is moderate risk for endovascular repair - recommend holding Xarelto 2 days prior to sugery  Disposition - awaiting SNF placement  For questions or updates, please contact [facility] Please consult [email or web address] for contact info under        Signed, Raelea Gosse David Stall, PA-C  04/25/2022, 11:08 AM

## 2022-04-25 NOTE — Progress Notes (Signed)
Progress Note   Patient: Gary Dawson OIT:[id number] DOB: November 23, 1940 DOA: 04/18/2022     7 DOS: the patient was seen and examined on 04/25/2022   Brief hospital course: Gary Dawson is a 82 year old male with history of permanent atrial fibrillation on Xarelto, cardiomyopathy, OSA on CPAP, history of hernia repair, history of tonsillectomy, who presents to the emergency department from urgent care center for chief concerns frequent falls and was sent to the ED for altered mental status.  Vitals in the emergency department showed temperature of 99.7, respiration rate 29, heart rate 98, blood pressure 129/104, SpO2 of 96% on room air.  Serum sodium is 137, potassium 4.1, chloride 102, bicarb 25, BUN of 27, serum creatinine 0.78, nonfasting blood glucose 112, EGFR greater than 60, WBC 8.7, hemoglobin 12.6, platelets of 166.  High since troponin is 19.  Lactic acid is 1.7.  CT of the head without contrast and CT cervical spine without contrast: Was read as no evidence of acute intracranial abnormality.  No acute cervical spine fracture.  Mild chronic small vessel ischemic disease and unchanged chronic ventriculomegaly.  CT abdomen pelvis with contrast: Was read as infrarenal saccular abdominal aortic aneurysm measuring up to 5.1 cm in maximum dimension.  Vascular surgical consultation is recommended.  Borderline prominence of gas and stool query mild constipation.  Moderate cardiomegaly with atherosclerotic calcification of the coronary arteries.  5 x 4 mm right middle lobe pulmonary nodule, no follow-up imaging of this nodule recommended.  Aortic and coronary atherosclerosis.  2.3 x 2.0 cm exophytic hypodense lesion in the right kidney and upper pole with internal density of 14 Hounsfield units.  No follow-up of this lesion is recommended.  CT L-spine: Was read as no acute osseous abnormality identified in the lumbar spine.  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  4/3: Vital stable.  Heart rate 80.  Cardiology and vascular surgery on board.  Patient had high risk aneurysm which need urgent intervention, will need a cardiac cath and goals of care discussion before that.  B12 elevated above 2500. Cardizem infusion was discontinued and patient was started on metoprolol. Echocardiogram with further decreased LVEF to 30 to 35%.  It was 45% on prior echo. Patient was started on heparin infusion while holding Xarelto and most likely will be getting his cardiac cath on Thursday afternoon or Friday morning.  4/4: Hemodynamically stable with well-controlled heart rate this morning.  Awaiting cardiac catheterization.  4/5: Heart rate remained mildly elevated in low 100s.  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  EF was moderately to severely reduced by echo.  Right heart cath shows mildly elevated left filling pressure, mild pulmonary hypertension and normal cardiac output.. Metoprolol dose was increased and they placed him as moderate risk for surgical interventions.  Cardiology also started him on digoxin. Vascular surgery will do procedure in 2 weeks-heparin is being switched with Xarelto  PT is recommending SNF.  TOC consult placed.  4/6: Hemodynamically stable.  Labs stable.  Awaiting SNF now.  4/7: Remained stable with improved heart rate.  Awaiting SNF placement, now had a bed offer-need to get insurance authorization.  4/8: Vitals and BMP stable.  Digoxin levels improving but still subtherapeutic at 0.5.  Low-dose losartan was added  by cardiology with consideration to switching to [facility] as outpatient.  Awaiting insurance authorization for SNF.  4/9: Patient remained stable.   Awaiting insurance authorization. Patient need to hold Xarelto 2 days prior to his surgery for AAA  Assessment and Plan: * Atrial fibrillation with rapid ventricular response  TSH level checked on 02/08/2022 and the value was 1.50 UA done was negative for leukocytes and nitrites and bacteria No clinical signs for infectious etiology at this time S/P Cardizem infusion which has been no switched with metoprolol. Echocardiogram with worsening of EF so home Cardizem was discontinued. Heart rate now well-controlled with higher doses of metoprolol and addition of digoxin. -Continue to monitor -Heparin now switched to Xarelto   Infrarenal abdominal aortic aneurysm (AAA) without rupture Infrarenal saccular abdominal aortic aneurysm measuring up to 5.1 cm in maximum dimension Vascular surgeon, Dr. Wyn Quaker has been consulted and they will plan a repair in 2 weeks as outpatient -Patient will need to hold Xarelto 2 days prior to surgery  Weakness Generalized weakness Query deconditioning however given patient's age, B12 elevated Can be due to worsening cardiomyopathy. -PT/OT are recommending -SNF as he lives alone and will require supervision  Permanent atrial fibrillation -Continue home metoprolol-will need titration of dose -Home Cardizem was discontinued by cardiology due to concern of worsening cardiomyopathy -Home Xarelto is temporarily being converted with heparin  NICM (nonischemic cardiomyopathy) Echocardiogram today with further decrease in his EF as compared to the prior done in 2022.  Apparently he also has an high risk stress test and never had left heart catheterization. 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  Concern of secondary to tachycardia. -Cardiology added Lasix -Low-dose losartan was added -consideration to switching to [facility] as outpatient -Continue Farxiga  OSA on CPAP CPAP nightly  ordered  Hyperlipidemia -Continue Lipitor  Benign essential hypertension Blood pressure currently within goal. -Continue metoprolol -Home Cardizem was discontinued -Most likely he will be started on Entresto by cardiology  CVA (cerebral vascular accident) Patient has an history of CVA.  No acute concerns -Continue aspirin and Lipitor   Subjective: Patient was seen and examined today.  No new concern.  Physical Exam: Vitals:   04/24/22 2341 04/25/22 0354 04/25/22 0754 04/25/22 1153  BP: 113/84 119/84 108/72 109/64  Pulse: (!) 106 95 80 92  Resp: 17 18 16 18   Temp: 97.9 F (36.6 C) 98.1 F (36.7 C) 97.6 F (36.4 C) 98.4 F (36.9 C)  TempSrc: Oral Oral Oral Oral  SpO2: 98% 99% 98% 99%  Weight:      Height:       General.  Frail gentleman, in no acute distress. Pulmonary.  Lungs clear bilaterally, normal respiratory effort. CV.  Irregularly irregular Abdomen.  Soft, nontender, nondistended, BS positive. CNS.  Alert and oriented .  No focal neurologic deficit. Extremities.  No edema, no cyanosis, pulses intact and symmetrical. Psychiatry.  Judgment and insight appears normal. .   Data Reviewed: Prior data reviewed  Family Communication: Discussed with patient  Disposition: Status is: Inpatient Remains inpatient appropriate because: Severity of illness  Planned Discharge Destination: Home with Home Health  DVT prophylaxis.  Heparin Time spent: 40 minutes  This record has been created using Conservation officer, historic buildings. Errors have been sought and corrected,but may not always be located. Such creation errors do not reflect on the standard of care.   Author: Arnetha Courser, MD 04/25/2022 1:54 PM  For on call review [email or web address].

## 2022-04-26 ENCOUNTER — Telehealth (INDEPENDENT_AMBULATORY_CARE_PROVIDER_SITE_OTHER): Payer: Self-pay | Admitting: Vascular Surgery

## 2022-04-26 DIAGNOSIS — Z743 Need for continuous supervision: Secondary | ICD-10-CM | POA: Diagnosis not present

## 2022-04-26 DIAGNOSIS — I5023 Acute on chronic systolic (congestive) heart failure: Secondary | ICD-10-CM | POA: Diagnosis not present

## 2022-04-26 DIAGNOSIS — M6281 Muscle weakness (generalized): Secondary | ICD-10-CM | POA: Diagnosis not present

## 2022-04-26 DIAGNOSIS — G4733 Obstructive sleep apnea (adult) (pediatric): Secondary | ICD-10-CM | POA: Diagnosis not present

## 2022-04-26 DIAGNOSIS — Z7189 Other specified counseling: Secondary | ICD-10-CM | POA: Diagnosis not present

## 2022-04-26 DIAGNOSIS — R52 Pain, unspecified: Secondary | ICD-10-CM | POA: Diagnosis not present

## 2022-04-26 DIAGNOSIS — I7143 Infrarenal abdominal aortic aneurysm, without rupture: Secondary | ICD-10-CM | POA: Diagnosis not present

## 2022-04-26 DIAGNOSIS — R531 Weakness: Secondary | ICD-10-CM | POA: Diagnosis not present

## 2022-04-26 DIAGNOSIS — I4821 Permanent atrial fibrillation: Secondary | ICD-10-CM | POA: Diagnosis not present

## 2022-04-26 DIAGNOSIS — I69398 Other sequelae of cerebral infarction: Secondary | ICD-10-CM | POA: Diagnosis not present

## 2022-04-26 DIAGNOSIS — I4891 Unspecified atrial fibrillation: Secondary | ICD-10-CM | POA: Diagnosis not present

## 2022-04-26 DIAGNOSIS — I428 Other cardiomyopathies: Secondary | ICD-10-CM | POA: Diagnosis not present

## 2022-04-26 DIAGNOSIS — K5900 Constipation, unspecified: Secondary | ICD-10-CM | POA: Diagnosis not present

## 2022-04-26 DIAGNOSIS — E785 Hyperlipidemia, unspecified: Secondary | ICD-10-CM | POA: Diagnosis not present

## 2022-04-26 MED ORDER — METOPROLOL SUCCINATE ER 100 MG PO TB24: 100.0000 mg | ORAL_TABLET | Freq: Two times a day (BID) | ORAL | 0 refills | Status: DC

## 2022-04-26 MED ORDER — ATORVASTATIN CALCIUM 40 MG PO TABS: 40.0000 mg | ORAL_TABLET | Freq: Every day | ORAL | Status: DC

## 2022-04-26 MED ORDER — LOSARTAN POTASSIUM 25 MG PO TABS: 25.0000 mg | ORAL_TABLET | Freq: Every day | ORAL | Status: DC

## 2022-04-26 MED ORDER — DIGOXIN 250 MCG PO TABS: 0.2500 mg | ORAL_TABLET | Freq: Every day | ORAL | Status: DC

## 2022-04-26 MED ORDER — DAPAGLIFLOZIN PROPANEDIOL 10 MG PO TABS: 10.0000 mg | ORAL_TABLET | Freq: Every day | ORAL | Status: DC

## 2022-04-26 MED ORDER — RIVAROXABAN 20 MG PO TABS: ORAL_TABLET | ORAL | 1 refills | Status: DC

## 2022-04-26 NOTE — Telephone Encounter (Signed)
Per Aleen Campi, NP, I LVM for pt's brother TCB and see if we can make a 2 week f/u with Dr. Wyn Quaker. Dr. Wyn Quaker would like for pt's brother, Antowan Vinyard, to be at the appointment so they both can understand what the procedure is and ask any questions that they have. We need to schedule a 2 week with Dr. Wyn Quaker at a time where both the patient (at [facility]) and his brother can be here. I will await a return phone call from Mr. Kirk Zajdel.

## 2022-04-26 NOTE — Progress Notes (Signed)
[facility] Liaison Note  Notified by Johny Shock of patient/family request of [facility] Paliative services.  [facility] hospital liaison will follow patient for discharge disposition.   Please call with any questions/concerns.    Thank you for the opportunity to participate in this patient's care.   Eugenie Birks, MSW [facility] Liaison  [phone]

## 2022-04-26 NOTE — TOC Transition Note (Signed)
Transition of Care [facility]) - CM/SW Discharge Note   Patient Details  Name: Claris Huger MRN: [id number] Date of Birth: 07-10-1940  [facility] CM/SW Contact:  Truddie Hidden, RN Phone Number: 04/26/2022, 1:34 PM   Clinical Narrative:    Spoke with Stanton Kidney  in admissions. Per facility patient admissions confirm for today. Patient assigned room # [facility]  Nurse will call report to [phone] Nurse, and family notified spoke with his brother, Renae Fickle Face sheet and medical necessity forms printed to the floor to be added to the EMS pack EMS arranged  Discharge summary and SNF tranfer report sent in HUB.  TOC signing off.          Patient Goals and CMS Choice      Discharge Placement                         Discharge Plan and Services Additional resources added to the After Visit Summary for                                       Social Determinants of Health (SDOH) Interventions SDOH Screenings   Food Insecurity: No Food Insecurity (04/19/2022)  Housing: Low Risk  (04/19/2022)  Transportation Needs: No Transportation Needs (04/19/2022)  Utilities: Not At Risk (04/19/2022)  Tobacco Use: Low Risk  (04/21/2022)     Readmission Risk Interventions     No data to display

## 2022-04-26 NOTE — Discharge Summary (Signed)
Physician Discharge Summary   Patient: Gary Dawson MRN: [id number] DOB: Oct 17, 1940  Admit date:     04/18/2022  Discharge date: 04/26/22  Discharge Physician: Delfino Lovett   PCP: Gracelyn Nurse, MD   Recommendations at discharge:   Follow-up with outpatient providers as requested Check BNP and digoxin level in about a week with results to cardiology Hold rivaroxaban 2 days before vascular procedure and restart as soon as after procedure as per vascular surgery Cardiology has cleared him for AAA intervention  Discharge Diagnoses: Principal Problem:   Atrial fibrillation with rapid ventricular response Active Problems:   Infrarenal abdominal aortic aneurysm (AAA) without rupture   Weakness   Permanent atrial fibrillation   NICM (nonischemic cardiomyopathy)   CVA (cerebral vascular accident)   Benign essential hypertension   Hyperlipidemia   OSA on CPAP   Acute on chronic HFrEF (heart failure with reduced ejection fraction)   Abdominal aortic aneurysm (AAA) without rupture  Hospital Course: Gary Dawson is a 82 year old male with history of permanent atrial fibrillation on Xarelto, cardiomyopathy, OSA on CPAP, history of hernia repair, history of tonsillectomy, who presents to the emergency department from urgent care center for chief concerns frequent falls and was sent to the ED for altered mental status.  Vitals in the emergency department showed temperature of 99.7, respiration rate 29, heart rate 98, blood pressure 129/104, SpO2 of 96% on room air.  Serum sodium is 137, potassium 4.1, chloride 102, bicarb 25, BUN of 27, serum creatinine 0.78, nonfasting blood glucose 112, EGFR greater than 60, WBC 8.7, hemoglobin 12.6, platelets of 166.  High since troponin is 19.  Lactic acid is 1.7.  CT of the head without contrast and CT cervical spine without contrast: Was read as no evidence of acute intracranial abnormality.  No acute cervical spine fracture.  Mild  chronic small vessel ischemic disease and unchanged chronic ventriculomegaly.  CT abdomen pelvis with contrast: Was read as infrarenal saccular abdominal aortic aneurysm measuring up to 5.1 cm in maximum dimension.  Vascular surgical consultation is recommended.  Borderline prominence of gas and stool query mild constipation.  Moderate cardiomegaly with atherosclerotic calcification of the coronary arteries.  5 x 4 mm right middle lobe pulmonary nodule, no follow-up imaging of this nodule recommended.  Aortic and coronary atherosclerosis.  2.3 x 2.0 cm exophytic hypodense lesion in the right kidney and upper pole with internal density of 14 Hounsfield units.  No follow-up of this lesion is recommended.  CT L-spine: Was read as no acute osseous abnormality identified in the lumbar spine.  Lumbar disc and facet degeneration and moderate spinal stenosis at L3-4 and L4-5  ED treatment: Diltiazem 10 mg IV one-time dose, diltiazem gtt., sodium chloride 1 L bolus.  4/3: Vital stable.  Heart rate 80.  Cardiology and vascular surgery on board.  Patient had high risk aneurysm which need urgent intervention, will need a cardiac cath and goals of care discussion before that.  B12 elevated above 2500. Cardizem infusion was discontinued and patient was started on metoprolol. Echocardiogram with further decreased LVEF to 30 to 35%.  It was 45% on prior echo. Patient was started on heparin infusion while holding Xarelto and most likely will be getting his cardiac cath on Thursday afternoon or Friday morning.  4/4: Hemodynamically stable with well-controlled heart rate this morning.  Awaiting cardiac catheterization.  4/5: Heart rate remained mildly elevated in low 100s.  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  EF was moderately to severely reduced  by echo.  Right heart cath shows mildly elevated left filling pressure, mild pulmonary hypertension and normal cardiac output.. Metoprolol dose was increased and they placed him as moderate risk for surgical interventions.  Cardiology also started him on digoxin. Vascular surgery will do procedure in 2 weeks-heparin is being switched with Xarelto  PT is recommending SNF.  TOC consult placed.  4/6: Hemodynamically stable.  Labs stable.  Awaiting SNF now.  4/7: Remained stable with improved heart rate.  Awaiting SNF placement, now had a bed offer-need to get insurance authorization.  4/8: Vitals and BMP stable.  Digoxin levels improving but still subtherapeutic at 0.5.  Low-dose losartan was added by cardiology with consideration to switching to [facility] as outpatient.  Awaiting insurance authorization for SNF.  4/9: Patient remained stable.  Awaiting insurance authorization. Patient need to hold Xarelto 2 days prior to his surgery for AAA  Assessment and Plan: * Atrial fibrillation with rapid ventricular response  TSH level checked on 02/08/2022 and the value was 1.50 UA done was negative for leukocytes and nitrites and bacteria No clinical signs for infectious etiology at this time S/P Cardizem infusion which has been no switched with metoprolol. Echocardiogram with worsening of EF so home Cardizem was discontinued. Heart rate now well-controlled with higher doses of metoprolol and addition of digoxin. -Heparin now switched to Xarelto - Rivaroxaban should be held for 2 days before the procedure and restarted as soon as it is safe to do so.   Infrarenal abdominal aortic aneurysm (AAA) without rupture Infrarenal saccular abdominal aortic aneurysm measuring up to 5.1 cm in maximum dimension Vascular surgeon, Dr. Wyn Quaker has been consulted and they will plan a repair in 2 weeks as outpatient -Patient will need to hold Xarelto 2 days prior to surgery and resume as soon as it is safe to do so per  vascular surgery  Weakness Generalized weakness -PT/OT are recommending -SNF as he lives alone and will require supervision.  He has a bed at [facility] where he is being discharged  Permanent atrial fibrillation -Continue metoprolol and digoxin for rate control -Home Cardizem was discontinued by cardiology due to concern of worsening cardiomyopathy -Xarelto for anticoagulation  NICM (nonischemic cardiomyopathy) Echocardiogram today with further decrease in his EF as compared to the prior done in 2022.  Apparently he also has an high risk stress test and never had left heart catheterization. 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  Concern of secondary to tachycardia. -Cardiology recommended to continue current heart failure regimen consisting of dapagliflozin, losartan, and metoprolol succinate.  Per cardiology he does not need standing diuretic therapy.  Continue digoxin in the setting of heart failure and atrial fibrillation.  OSA on CPAP CPAP nightly ordered  Hyperlipidemia -Continue Lipitor  Benign essential hypertension Blood pressure currently within goal. -Continue metoprolol  CVA (cerebral vascular accident) Patient has an history of CVA.  No acute concerns -Continue Xarelto and Lipitor  Avoid narcotics due to history of falls       Consultants: Cardiology Disposition: Skilled nursing facility/[facility] with palliative care Diet recommendation:  Discharge Diet Orders (From admission, onward)     Start     Ordered   04/26/22 0000  Diet - low sodium heart healthy        04/26/22 1222  Cardiac diet DISCHARGE MEDICATION: Allergies as of 04/26/2022       Reactions   Other Other (See Comments)   Trees and grasses        Medication List     STOP taking these medications    diltiazem 120 MG 24 hr capsule Commonly known as: CARDIZEM CD   traMADol 50 MG  tablet Commonly known as: ULTRAM       TAKE these medications    atorvastatin 40 MG tablet Commonly known as: LIPITOR Take 1 tablet (40 mg total) by mouth daily. Start taking on: April 27, 2022   dapagliflozin propanediol 10 MG Tabs tablet Commonly known as: FARXIGA Take 1 tablet (10 mg total) by mouth daily. Start taking on: April 27, 2022   digoxin 0.25 MG tablet Commonly known as: LANOXIN Take 1 tablet (0.25 mg total) by mouth daily. Start taking on: April 27, 2022   hydroxypropyl methylcellulose / hypromellose 2.5 % ophthalmic solution Commonly known as: ISOPTO TEARS / GONIOVISC Place 1 drop into both eyes 2 (two) times daily as needed for dry eyes.   losartan 25 MG tablet Commonly known as: COZAAR Take 1 tablet (25 mg total) by mouth daily. Start taking on: April 27, 2022   metoprolol succinate 100 MG 24 hr tablet Commonly known as: TOPROL-XL Take 1 tablet (100 mg total) by mouth 2 (two) times daily. Take with or immediately following a meal. What changed:  medication strength how much to take when to take this additional instructions   mirabegron ER 50 MG Tb24 tablet Commonly known as: MYRBETRIQ Take 1 tablet (50 mg total) by mouth daily.   rivaroxaban 20 MG Tabs tablet Commonly known as: Xarelto TAKE 1 TABLET EVERY DAY WITH SUPPER. He can proceed with AAA intervention at the discretion of vascular surgery.  Rivaroxaban should be held for 2 days before the procedure and restarted as soon as it is safe to do so. What changed: See the new instructions.   VITAMIN D (ERGOCALCIFEROL) PO Take 1,000 Int'l Units by mouth daily.        Contact information for follow-up providers     Dew, Marlow BaarsJason S, MD Follow up in 2 week(s).   Specialties: Vascular Surgery, Radiology, Interventional Cardiology Why: With Dr Wynona Mealsew Contact information: [address] [place] [address] [phone]         Gracelyn NurseJohnston, John D, MD. Schedule an appointment as  soon as possible for a visit in 1 week(s).   Specialty: Internal Medicine Why: [facility] Discharge F/UP Contact information: [address] [place] [address] [phone]         Antonieta IbaGollan, Timothy J, MD. Schedule an appointment as soon as possible for a visit in 3 week(s).   Specialty: Cardiology Why: [facility] Discharge F/UP Contact information: [address] [place] [address] [phone]              Contact information for after-discharge care     Destination     [facility] .   Service: Skilled Nursing Contact information: [address] [place] [id number] [phone]                    Discharge Exam: Gary MonsFiled Weights   04/20/22 0454 04/21/22 0441 04/26/22 0344  Weight: 87.7 kg 85.3 kg 70.3 kg   General.  Frail gentleman, in no acute distress. Pulmonary.  Lungs clear bilaterally, normal respiratory effort. CV.  Irregularly irregular Abdomen.  Soft, nontender, nondistended, BS positive. CNS.  Alert and oriented .  No focal neurologic deficit. Extremities.  No edema, no cyanosis, pulses intact and symmetrical. Psychiatry.  Judgment and insight appears normal. .   Condition at discharge: fair  The results of significant diagnostics from this hospitalization (including imaging, microbiology, ancillary and laboratory) are listed below for reference.   Imaging Studies: CARDIAC CATHETERIZATION  Result Date: 04/20/2022   RPDA lesion is 100% stenosed.   1st Diag lesion is 40% stenosed.   Mid LAD to Dist LAD lesion is 30% stenosed. 1.  Heavily calcified coronary arteries with mild to moderate nonobstructive coronary artery disease with the exception of an occluded proximal right PDA with left-to-right collaterals. 2.  Left ventricular angiography was not performed.  EF was moderately to severely reduced by echo. 3.  Right heart catheterization showed mildly elevated  left-sided filling pressures, mild pulmonary hypertension and normal cardiac output. 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 His coronary arteries are heavily calcified but with no obstructive disease and does not require revascularization.  Recommend aggressive medical therapy.\ I increased the dose of Toprol for better rate control of atrial fibrillation. Heparin can be resumed at 6 PM and can be transition back to Xarelto if no plans for AAA repair during this admission. The patient is considered at moderate risk from a cardiac standpoint.   ECHOCARDIOGRAM COMPLETE  Result Date: 04/19/2022    ECHOCARDIOGRAM REPORT   Patient Name:   Gary Dawson Date of Exam: 04/19/2022 Medical Rec #:  [id number]              Height:       73.0 in Accession #:    [id number]             Weight:       194.0 lb Date of Birth:  1940-05-21              BSA:          2.124 m Patient Age:    81 years               BP:           132/87 mmHg Patient Gender: M                      HR:           88 bpm. Exam Location:  [facility] Procedure: 2D Echo, Cardiac Doppler and Color Doppler Indications:     Atrial Fibrillation I48.91  History:         Patient has prior history of Echocardiogram examinations, most                  recent 05/19/2020. Mitral Valve Prolapse; Risk                  Factors:Hypertension. Persistent Afib.  Sonographer:     Cristela Blue Referring Phys:  [id number] AMY N COX Diagnosing Phys: Debbe Odea MD IMPRESSIONS  1. Left ventricular ejection fraction, by estimation, is 30 to 35%. The left ventricle has moderate to severely decreased function. The left ventricle demonstrates global hypokinesis. There is mild left ventricular hypertrophy. Left ventricular diastolic parameters are indeterminate.  2. Right ventricular systolic function is mildly reduced. The right ventricular size is moderately enlarged. There is mildly elevated pulmonary artery systolic pressure.  3. Right atrial  size was severely dilated.  4. The mitral valve is normal  in structure. Mild mitral valve regurgitation.  5. Tricuspid valve regurgitation is moderate.  6. The aortic valve is tricuspid. Aortic valve regurgitation is mild. Aortic valve sclerosis/calcification is present, without any evidence of aortic stenosis.  7. Aortic dilatation noted. There is mild dilatation of the aortic root, measuring 40 mm.  8. The inferior vena cava is normal in size with <50% respiratory variability, suggesting right atrial pressure of 8 mmHg. FINDINGS  Left Ventricle: Left ventricular ejection fraction, by estimation, is 30 to 35%. The left ventricle has moderate to severely decreased function. The left ventricle demonstrates global hypokinesis. The left ventricular internal cavity size was normal in size. There is mild left ventricular hypertrophy. Left ventricular diastolic parameters are indeterminate. Right Ventricle: The right ventricular size is moderately enlarged. No increase in right ventricular wall thickness. Right ventricular systolic function is mildly reduced. There is mildly elevated pulmonary artery systolic pressure. The tricuspid regurgitant velocity is 2.84 m/s, and with an assumed right atrial pressure of 8 mmHg, the estimated right ventricular systolic pressure is 40.3 mmHg. Left Atrium: Left atrial size was normal in size. Right Atrium: Right atrial size was severely dilated. Pericardium: There is no evidence of pericardial effusion. Mitral Valve: The mitral valve is normal in structure. Mild mitral valve regurgitation. Tricuspid Valve: The tricuspid valve is normal in structure. Tricuspid valve regurgitation is moderate. Aortic Valve: The aortic valve is tricuspid. Aortic valve regurgitation is mild. Aortic regurgitation PHT measures 595 msec. Aortic valve sclerosis/calcification is present, without any evidence of aortic stenosis. Aortic valve mean gradient measures 2.0  mmHg. Aortic valve peak gradient measures  3.1 mmHg. Aortic valve area, by VTI measures 3.35 cm. Pulmonic Valve: The pulmonic valve was normal in structure. Pulmonic valve regurgitation is mild. Aorta: Aortic dilatation noted. There is mild dilatation of the aortic root, measuring 40 mm. Venous: The inferior vena cava is normal in size with less than 50% respiratory variability, suggesting right atrial pressure of 8 mmHg. IAS/Shunts: No atrial level shunt detected by color flow Doppler.  LEFT VENTRICLE PLAX 2D LVIDd:         5.30 cm LVIDs:         4.90 cm LV PW:         1.10 cm LV IVS:        1.20 cm LVOT diam:     2.30 cm LV SV:         52 LV SV Index:   24 LVOT Area:     4.15 cm  LV Volumes (MOD) LV vol d, MOD A2C: 99.4 ml LV vol d, MOD A4C: 109.2 ml LV vol s, MOD A2C: 55.0 ml LV vol s, MOD A4C: 72.2 ml LV SV MOD A2C:     44.4 ml LV SV MOD A4C:     109.2 ml LV SV MOD BP:      43.1 ml RIGHT VENTRICLE RV Basal diam:  5.60 cm RV Mid diam:    3.20 cm RV S prime:     7.83 cm/s TAPSE (M-mode): 1.1 cm LEFT ATRIUM             Index        RIGHT ATRIUM           Index LA diam:        4.50 cm 2.12 cm/m   RA Area:     36.40 cm LA Vol (A2C):   45.3 ml 21.33 ml/m  RA Volume:   151.00 ml 71.09 ml/m LA Vol (  A4C):   47.5 ml 22.36 ml/m LA Biplane Vol: 50.7 ml 23.87 ml/m  AORTIC VALVE AV Area (Vmax):    3.35 cm AV Area (Vmean):   3.27 cm AV Area (VTI):     3.35 cm AV Vmax:           88.15 cm/s AV Vmean:          59.250 cm/s AV VTI:            0.155 m AV Peak Grad:      3.1 mmHg AV Mean Grad:      2.0 mmHg LVOT Vmax:         71.10 cm/s LVOT Vmean:        46.600 cm/s LVOT VTI:          0.125 m LVOT/AV VTI ratio: 0.81 AI PHT:            595 msec  AORTA Ao Root diam: 4.07 cm MITRAL VALVE               TRICUSPID VALVE MV Area (PHT): 6.43 cm    TR Peak grad:   32.3 mmHg MV Decel Time: 118 msec    TR Vmax:        284.00 cm/s MV E velocity: 76.50 cm/s                            SHUNTS                            Systemic VTI:  0.12 m                            Systemic  Diam: 2.30 cm Debbe Odea MD Electronically signed by Debbe Odea MD Signature Date/Time: 04/19/2022/1:45:20 PM    Final    DG Chest Port 1 View  Result Date: 04/18/2022 CLINICAL DATA:  Confusion, weakness EXAM: PORTABLE CHEST 1 VIEW COMPARISON:  09/29/2019, 04/18/2022 FINDINGS: Single frontal view of the chest demonstrates stable enlargement of the cardiac silhouette. No acute airspace disease, effusion, or pneumothorax. No acute bony abnormality. IMPRESSION: 1. Stable enlarged cardiac silhouette. 2. No acute intrathoracic process. Electronically Signed   By: Sharlet Salina M.D.   On: 04/18/2022 18:24   CT ABDOMEN PELVIS W CONTRAST  Result Date: 04/18/2022 CLINICAL DATA:  Acute abdominal pain and low back pain after fall. EXAM: CT ABDOMEN AND PELVIS WITH CONTRAST TECHNIQUE: Multidetector CT imaging of the abdomen and pelvis was performed using the standard protocol following bolus administration of intravenous contrast. RADIATION DOSE REDUCTION: This exam was performed according to the departmental dose-optimization program which includes automated exposure control, adjustment of the mA and/or kV according to patient size and/or use of iterative reconstruction technique. CONTRAST:  OMNIPAQUE IOHEXOL 300 MG/ML  SOLN COMPARISON:  12/06/2009 FINDINGS: Despite efforts by the technologist and patient, motion artifact is present on today's exam and could not be eliminated. This reduces exam sensitivity and specificity. Lower chest: Moderate cardiomegaly with atherosclerotic calcification of the left anterior descending, right, and circumflex coronary arteries as well as the descending thoracic aorta. 5 by 4 mm right middle lobe pulmonary nodule on image 1 series 4, not included on the prior CT of 12/06/2009. No follow-up imaging of this nodule is recommended. JACR 2018 Feb; 264-273, Management of the Incidental Renal Mass on CT, [employer] 2021; 814-848, Bosniak  Classification of Cystic Renal  Masses, Version 2019. Per Fleischner Society Guidelines, no routine follow-up imaging is recommended. These guidelines do not apply to immunocompromised patients and patients with cancer. Follow up in patients with significant comorbidities as clinically warranted. For lung cancer screening, adhere to Lung-RADS guidelines. Reference: Radiology. 2017; 284(1):228-43. Hepatobiliary: Unremarkable Pancreas: Unremarkable Spleen: Unremarkable Adrenals/Urinary Tract: Both adrenal glands appear normal. 2.3 by 2.0 cm exophytic hypodense lesion of the right kidney upper pole with internal density of 14 Hounsfield units and relatively homogeneous density characteristics. No follow-up imaging of this cyst is recommended. JACR 2018 Feb; 264-273, Management of the Incidental Renal Mass on CT, [employer]Graphics 2021; 814-848, Bosniak Classification of Cystic Renal Masses, Version 2019. Additional small hypodense renal cysts in both kidneys noted. No further imaging workup of these lesions is indicated. Stomach/Bowel: Borderline prominence of gas and stool in the colon, query mild constipation. 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 The saccular portion of the aneurysm measures 4.6 by 2.9 by 4.0 cm, with a margin of thrombosis and a centrally enhancing component. If we include the abdominal aorta itself in the overall size determination, this measures 5.1 cm in maximum dimension. I do not see evidence of acute or recent retroperitoneal hemorrhage or current aortoduodenal fistula, although the duodenum abuts this saccular aneurysm. Vascular surgical consultation is recommended. Reference: J Vasc Surg 1610;96:0-45. 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  Mild residual asymmetry with the right peripheral prostate gland or protuberant than the left. Other: No supplemental non-categorized findings. Musculoskeletal: Please see dedicated spine CT reports. Moderate degenerative hip arthropathy bilaterally. IMPRESSION: 1. 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 Vascular surgical consultation is recommended. 2. Borderline prominence of gas and stool in the colon, query mild constipation. 3. Moderate cardiomegaly with atherosclerotic calcification of the coronary arteries. 4. Moderate degenerative hip arthropathy bilaterally. 5. 5 by 4 mm right middle lobe pulmonary nodule. No follow-up imaging of this nodule is recommended. 6. Aortic and coronary atherosclerosis. 7. 2.3 by 2.0 cm exophytic hypodense lesion of the right kidney upper pole with internal density of 14 Hounsfield units and relatively homogeneous density characteristics. No follow up of this lesion is recommended. Aortic Atherosclerosis (ICD10-I70.0). Electronically Signed   By: Gaylyn Rong M.D.   On: 04/18/2022 17:12   CT L-SPINE NO CHARGE  Result Date: 04/18/2022 CLINICAL DATA:  Fall. EXAM: CT LUMBAR SPINE WITHOUT CONTRAST TECHNIQUE: Multidetector CT imaging of the lumbar spine was performed without intravenous contrast administration. Multiplanar CT image reconstructions were also generated. RADIATION DOSE REDUCTION: This exam was performed according to the departmental dose-optimization program which includes automated exposure control, adjustment of the mA and/or kV according to patient size and/or use of iterative reconstruction technique. COMPARISON:  CT abdomen and pelvis 12/06/2009 FINDINGS: Segmentation: There is transitional lumbosacral anatomy. Designating the lowest ribs is T12, the transitional segment is a partially sacralized L5. Alignment: Mild lumbar levoscoliosis. Trace anterolisthesis of L3 on L4. Vertebrae: Mild T11 and L1 superior  endplate compression fractures, chronic in appearance. Scattered Schmorl's nodes. No definite acute fracture. No destructive osseous lesion. Solid bridging anterior vertebral osteophytes at T8-9 and T9-10. Paraspinal and other soft tissues: No acute abnormality in the paraspinal soft tissues. Remainder of the abdomen and pelvis  reported separately. Disc levels: Disc bulging and posterior element hypertrophy result in mild spinal stenosis at L2-3 and likely moderate spinal stenosis at L3-4 and L4-5. Mild-to-moderate multilevel neural foraminal stenosis. IMPRESSION: 1. No acute osseous abnormality identified in the lumbar spine. 2. 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 Electronically Signed   By: Sebastian Ache M.D.   On: 04/18/2022 17:11   CT HEAD WO CONTRAST ( )  Result Date: 04/18/2022 CLINICAL DATA:  Head trauma, moderate-severe; Neck trauma (Age >= 65y). EXAM: CT HEAD WITHOUT CONTRAST CT CERVICAL SPINE WITHOUT CONTRAST TECHNIQUE: Multidetector CT imaging of the head and cervical spine was performed following the standard protocol without intravenous contrast. Multiplanar CT image reconstructions of the cervical spine were also generated. RADIATION DOSE REDUCTION: This exam was performed according to the departmental dose-optimization program which includes automated exposure control, adjustment of the mA and/or kV according to patient size and/or use of iterative reconstruction technique. COMPARISON:  CT head and cervical spine 03/19/2020. FINDINGS: CT HEAD FINDINGS Brain: There is no evidence of an acute infarct, intracranial hemorrhage, mass, midline shift, or extra-axial fluid collection. Moderate ventriculomegaly is unchanged and may reflect central predominant cerebral atrophy or chronic communicating hydrocephalus. Cerebral white matter hypodensities are unchanged and nonspecific but compatible with mild chronic small vessel ischemic disease. 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 Vascular: Calcified atherosclerosis at the skull base. No hyperdense vessel. Skull: No acute fracture or suspicious osseous lesion. Sinuses/Orbits: Tiny mucous retention cyst in the left sphenoid sinus. Clear mastoid air cells. Bilateral cataract extraction. Other: None. CT CERVICAL SPINE FINDINGS Alignment: Unchanged trace retrolisthesis of C3 on C4. Skull base and vertebrae: Unchanged mild chronic C7 superior endplate compression fracture. No acute fracture or suspicious osseous lesion. Soft tissues and spinal canal: No prevertebral fluid or swelling. No visible canal hematoma. Disc levels: Similar appearance of multilevel disc degeneration, with moderate disc space narrowing noted from C3-4 through C5-6. Disc bulging and uncovertebral spurring at these levels result in mild spinal stenosis an asymmetrically advanced right-sided neural foraminal stenosis. Upper chest: No mass or consolidation in the included lung apices. Other: None. IMPRESSION: 1. No evidence of acute intracranial abnormality. 2. Mild chronic small vessel ischemic disease and unchanged chronic ventriculomegaly. 3. No acute cervical spine fracture. Electronically Signed   By: Sebastian Ache M.D.   On: 04/18/2022 17:02   CT Cervical Spine Wo Contrast  Result Date: 04/18/2022 CLINICAL DATA:  Head trauma, moderate-severe; Neck trauma (Age >= 65y). EXAM: CT HEAD WITHOUT CONTRAST CT CERVICAL SPINE WITHOUT CONTRAST TECHNIQUE: Multidetector CT imaging of the head and cervical spine was performed following the standard protocol without intravenous contrast. Multiplanar CT image reconstructions of the cervical spine were also generated. RADIATION DOSE REDUCTION: This exam was performed according to the departmental dose-optimization program which includes automated exposure control, adjustment of the mA and/or kV according to patient size and/or use of iterative reconstruction technique. COMPARISON:  CT head  and cervical spine 03/19/2020. FINDINGS: CT HEAD FINDINGS Brain: There is no evidence of an acute infarct, intracranial hemorrhage, mass, midline shift, or extra-axial fluid collection. Moderate ventriculomegaly is unchanged and may reflect central predominant cerebral atrophy or chronic communicating hydrocephalus. Cerebral white matter hypodensities are unchanged and nonspecific but compatible with mild chronic small vessel ischemic disease. 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 Vascular: Calcified atherosclerosis at the skull base. No hyperdense vessel. Skull: No acute fracture or suspicious osseous lesion. Sinuses/Orbits: Tiny mucous retention cyst in  the left sphenoid sinus. Clear mastoid air cells. Bilateral cataract extraction. Other: None. CT CERVICAL SPINE FINDINGS Alignment: Unchanged trace retrolisthesis of C3 on C4. Skull base and vertebrae: Unchanged mild chronic C7 superior endplate compression fracture. No acute fracture or suspicious osseous lesion. Soft tissues and spinal canal: No prevertebral fluid or swelling. No visible canal hematoma. Disc levels: Similar appearance of multilevel disc degeneration, with moderate disc space narrowing noted from C3-4 through C5-6. Disc bulging and uncovertebral spurring at these levels result in mild spinal stenosis an asymmetrically advanced right-sided neural foraminal stenosis. Upper chest: No mass or consolidation in the included lung apices. Other: None. IMPRESSION: 1. No evidence of acute intracranial abnormality. 2. Mild chronic small vessel ischemic disease and unchanged chronic ventriculomegaly. 3. No acute cervical spine fracture. Electronically Signed   By: Sebastian Ache M.D.   On: 04/18/2022 17:02    Microbiology: Results for orders placed or performed in visit on 03/09/22  Microscopic Examination     Status: None   Collection Time: 03/09/22  8:17 AM   Urine  Result Value Ref Range Status   WBC, UA 0-5 0 - 5 /hpf Final    RBC, Urine 0-2 0 - 2 /hpf Final   Epithelial Cells (non renal) 0-10 0 - 10 /hpf Final   Bacteria, UA Few None seen/Few Final    Labs: CBC: Recent Labs  Lab 04/20/22 0711 04/20/22 1414 04/20/22 1417 04/21/22 0428 04/22/22 0639  WBC 5.6  --   --  5.4 5.7  HGB 12.1* 11.6* 11.6* 12.1* 12.1*  HCT 35.8* 34.0* 34.0* 35.7* 35.9*  MCV 100.0  --   --  99.4 99.7  PLT 168  --   --  181 189   Basic Metabolic Panel: Recent Labs  Lab 04/20/22 1417 04/22/22 0639 04/23/22 0613 04/24/22 0624 04/25/22 0535  NA 137 137 138 138 137  K 4.1 3.8 3.9 4.2 3.8  CL  --  104 105 106 107  CO2  --  GLUCOSE  --  87 89 94 141*  BUN  --  26*  CREATININE  --  0.81 0.94 1.02 0.98  CALCIUM  --  8.5* 8.6* 8.7* 8.5*   Liver Function Tests: No results for input(s): "AST", "ALT", "ALKPHOS", "BILITOT", "PROT", "ALBUMIN" in the last 168 hours. CBG: No results for input(s): "GLUCAP" in the last 168 hours.  Discharge time spent: greater than 30 minutes.  Signed: Delfino Lovett, MD [facility] 04/26/2022

## 2022-04-26 NOTE — Progress Notes (Signed)
Occupational Therapy Treatment Patient Details Name: Gary Dawson MRN: [id number] DOB: Nov 27, 1940 Today's Date: 04/26/2022   History of present illness Zola Capponi is 82 y/o male with PMH including a-fib, CVA, OSA, weakness. Pt admitted with increasing weakness, back pain, two recent falls per medical record.   OT comments  Upon entering the room, pt supine in bed and agreeable to OT intervention. Pt reports performs bed mobility with supervision and stands with RW to ambulates to sink. Pt standing with close supervision for oral hygiene and washing face. Pt then reports need to urinate and begins to go on floor as purewick has malfunctioned. OT able to assist pt with holding urinal secondary to urgency while standing. Pt ambulates 100' with RW and min guard for balance with HR increasing to 130-140 bpm. Pt stopping to take rest break and work on breathing techniques before returning to sit in recliner chair at end of session. Call bell and all needed items within reach. Bed alarm activated for safety.    Recommendations for follow up therapy are one component of a multi-disciplinary discharge planning process, led by the attending physician.  Recommendations may be updated based on patient status, additional functional criteria and insurance authorization.    Assistance Recommended at Discharge Frequent or constant Supervision/Assistance  Patient can return home with the following  A little help with walking and/or transfers;A little help with bathing/dressing/bathroom;Assistance with cooking/housework;Direct supervision/assist for medications management;Direct supervision/assist for financial management;Assist for transportation;Help with stairs or ramp for entrance   Equipment Recommendations  Other (comment) (defer)       Precautions / Restrictions Precautions Precautions: Fall Precaution Comments: monitor HR Restrictions Weight Bearing Restrictions: No       Mobility  Bed Mobility Overal bed mobility: Needs Assistance Bed Mobility: Supine to Sit     Supine to sit: Supervision          Transfers Overall transfer level: Needs assistance Equipment used: Rolling walker (2 wheels) Transfers: Sit to/from Stand Sit to Stand: Supervision, Min guard                 Balance Overall balance assessment: Needs assistance Sitting-balance support: Feet supported Sitting balance-Leahy Scale: Good     Standing balance support: Bilateral upper extremity supported, During functional activity, Reliant on assistive device for balance Standing balance-Leahy Scale: Fair Standing balance comment: reliant on RW                           ADL either performed or assessed with clinical judgement   ADL Overall ADL's : Needs assistance/impaired     Grooming: Wash/dry hands;Wash/dry face;Oral care;Standing;Supervision/safety                                       Vision Patient Visual Report: No change from baseline            Cognition Arousal/Alertness: Awake/alert Behavior During Therapy: WFL for tasks assessed/performed Overall Cognitive Status: No family/caregiver present to determine baseline cognitive functioning                                 General Comments: min cuing for safety awareness                   Pertinent Vitals/ Pain  Pain Assessment Pain Assessment: No/denies pain         Frequency  Min 1X/week        Progress Toward Goals  OT Goals(current goals can now be found in the care plan section)  Progress towards OT goals: Progressing toward goals     Plan Discharge plan remains appropriate;Frequency needs to be updated       AM-PAC OT "6 Clicks" Daily Activity     Outcome Measure   Help from another person eating meals?: None Help from another person taking care of personal grooming?: A Little Help from another person toileting, which includes using toliet,  bedpan, or urinal?: A Little Help from another person bathing (including washing, rinsing, drying)?: A Little Help from another person to put on and taking off regular upper body clothing?: A Little Help from another person to put on and taking off regular lower body clothing?: A Little 6 Click Score: 19    End of Session Equipment Utilized During Treatment: Rolling walker (2 wheels)  OT Visit Diagnosis: Unsteadiness on feet (R26.81);History of falling (Z91.81)   Activity Tolerance Patient tolerated treatment well   Patient Left in chair;with call bell/phone within reach;with chair alarm set   Nurse Communication Mobility status        Time: 9629-5284 OT Time Calculation (min): 21 min  Charges: OT General Charges $OT Visit: 1 Visit OT Treatments $Self Care/Home Management : 8-22 mins  Jackquline Denmark, MS, OTR/L , CBIS ascom [phone]  04/26/22, 1:03 PM

## 2022-04-26 NOTE — Progress Notes (Signed)
Physical Therapy Treatment Patient Details Name: Gary Dawson MRN: [id number] DOB: 1940-08-12 Today's Date: 04/26/2022   History of Present Illness Gary Dawson is 82 y/o male with PMH including a-fib, CVA, OSA, weakness. Pt admitted with increasing weakness, back pain, two recent falls per medical record.    PT Comments    Pt has already AMB in hall upon arrival and is up to chair. Pt steadily improving, albeit remains weak, struggling with rising to standing and walking a normal pacing. Pt also more unsteady than baseline, multiple postural sways beyond a point of self-righting. Pt remains calm, pleasant, and motivated. Problem solving around obstacles in room while AMB is unsafe.    Recommendations for follow up therapy are one component of a multi-disciplinary discharge planning process, led by the attending physician.  Recommendations may be updated based on patient status, additional functional criteria and insurance authorization.  Follow Up Recommendations  Can patient physically be transported by private vehicle: Yes    Assistance Recommended at Discharge Intermittent Supervision/Assistance  Patient can return home with the following A little help with walking and/or transfers;A little help with bathing/dressing/bathroom;Assist for transportation;Assistance with cooking/housework;Help with stairs or ramp for entrance   Equipment Recommendations       Recommendations for Other Services       Precautions / Restrictions Restrictions Weight Bearing Restrictions: No     Mobility  Bed Mobility                    Transfers   Equipment used: Rolling walker (2 wheels) Transfers: Sit to/from Stand Sit to Stand: Supervision, Min guard                Ambulation/Gait Ambulation/Gait assistance: Min guard Gait Distance (Feet): 120 Feet Assistive device: Rolling walker (2 wheels) Gait Pattern/deviations: Step-to pattern       General Gait  Details: a few LOB, require author to intervene Administrator Rankin (Stroke Patients Only)       Balance                                            Cognition                                                Exercises Other Exercises Other Exercises: 5xSTS from elevated EOB (2 sets)    General Comments        Pertinent Vitals/Pain Pain Assessment Pain Assessment: No/denies pain    Home Living                          Prior Function            PT Goals (current goals can now be found in the care plan section) Acute Rehab PT Goals Patient Stated Goal: to return home PT Goal Formulation: With patient Time For Goal Achievement: 05/03/22 Potential to Achieve Goals: Good Progress towards PT goals: Progressing toward goals    Frequency    Min 3X/week      PT Plan Current plan remains appropriate    Co-evaluation  AM-PAC PT "6 Clicks" Mobility   Outcome Measure  Help needed turning from your back to your side while in a flat bed without using bedrails?: None Help needed moving from lying on your back to sitting on the side of a flat bed without using bedrails?: A Little Help needed moving to and from a bed to a chair (including a wheelchair)?: A Little Help needed standing up from a chair using your arms (e.g., wheelchair or bedside chair)?: A Little Help needed to walk in hospital room?: A Little Help needed climbing 3-5 steps with a railing? : A Little 6 Click Score: 19    End of Session Equipment Utilized During Treatment: Gait belt Activity Tolerance: Patient tolerated treatment well;No increased pain Patient left: with call bell/phone within reach;in chair;with chair alarm set Nurse Communication: Mobility status PT Visit Diagnosis: Unsteadiness on feet (R26.81);Other abnormalities of gait and mobility (R26.89);Muscle weakness  (generalized) (M62.81);Difficulty in walking, not elsewhere classified (R26.2)     Time: 9826-4158 PT Time Calculation (min) (ACUTE ONLY): 25 min  Charges:  $Therapeutic Exercise: 23-37 mins                    12:12 PM, 04/26/22 Rosamaria Lints, PT, DPT Physical Therapist - [facility]  [phone] (ASCOM)   Petula Rotolo C 04/26/2022, 12:10 PM

## 2022-04-27 ENCOUNTER — Telehealth: Payer: Self-pay

## 2022-04-27 DIAGNOSIS — K5900 Constipation, unspecified: Secondary | ICD-10-CM | POA: Diagnosis not present

## 2022-04-27 DIAGNOSIS — Z7189 Other specified counseling: Secondary | ICD-10-CM | POA: Diagnosis not present

## 2022-04-27 DIAGNOSIS — G4733 Obstructive sleep apnea (adult) (pediatric): Secondary | ICD-10-CM | POA: Diagnosis not present

## 2022-04-27 DIAGNOSIS — I428 Other cardiomyopathies: Secondary | ICD-10-CM | POA: Diagnosis not present

## 2022-04-27 DIAGNOSIS — R52 Pain, unspecified: Secondary | ICD-10-CM | POA: Diagnosis not present

## 2022-04-27 DIAGNOSIS — R531 Weakness: Secondary | ICD-10-CM | POA: Diagnosis not present

## 2022-04-27 DIAGNOSIS — I7143 Infrarenal abdominal aortic aneurysm, without rupture: Secondary | ICD-10-CM | POA: Diagnosis not present

## 2022-04-27 DIAGNOSIS — I4821 Permanent atrial fibrillation: Secondary | ICD-10-CM | POA: Diagnosis not present

## 2022-04-27 NOTE — Telephone Encounter (Signed)
   Called pt  to schedule NP appt late April/ early May. ([facility] already seeing next week) Hospitalist referral. Pt stated that he can't make an appointment at this time. He will call clinic (clinic number provided) when he is ready and reviews his calendar to schedule this appointment.

## 2022-05-01 DIAGNOSIS — R52 Pain, unspecified: Secondary | ICD-10-CM | POA: Diagnosis not present

## 2022-05-01 DIAGNOSIS — I4821 Permanent atrial fibrillation: Secondary | ICD-10-CM | POA: Diagnosis not present

## 2022-05-01 DIAGNOSIS — I7143 Infrarenal abdominal aortic aneurysm, without rupture: Secondary | ICD-10-CM | POA: Diagnosis not present

## 2022-05-01 DIAGNOSIS — K5900 Constipation, unspecified: Secondary | ICD-10-CM | POA: Diagnosis not present

## 2022-05-01 DIAGNOSIS — G4733 Obstructive sleep apnea (adult) (pediatric): Secondary | ICD-10-CM | POA: Diagnosis not present

## 2022-05-01 DIAGNOSIS — E785 Hyperlipidemia, unspecified: Secondary | ICD-10-CM | POA: Diagnosis not present

## 2022-05-01 DIAGNOSIS — R531 Weakness: Secondary | ICD-10-CM | POA: Diagnosis not present

## 2022-05-01 DIAGNOSIS — I428 Other cardiomyopathies: Secondary | ICD-10-CM | POA: Diagnosis not present

## 2022-05-02 DIAGNOSIS — I110 Hypertensive heart disease with heart failure: Secondary | ICD-10-CM | POA: Diagnosis not present

## 2022-05-02 DIAGNOSIS — I7143 Infrarenal abdominal aortic aneurysm, without rupture: Secondary | ICD-10-CM | POA: Diagnosis not present

## 2022-05-02 DIAGNOSIS — I4821 Permanent atrial fibrillation: Secondary | ICD-10-CM | POA: Diagnosis not present

## 2022-05-02 DIAGNOSIS — I5023 Acute on chronic systolic (congestive) heart failure: Secondary | ICD-10-CM | POA: Diagnosis not present

## 2022-05-02 DIAGNOSIS — E559 Vitamin D deficiency, unspecified: Secondary | ICD-10-CM | POA: Diagnosis not present

## 2022-05-02 DIAGNOSIS — E785 Hyperlipidemia, unspecified: Secondary | ICD-10-CM | POA: Diagnosis not present

## 2022-05-02 DIAGNOSIS — I428 Other cardiomyopathies: Secondary | ICD-10-CM | POA: Diagnosis not present

## 2022-05-02 DIAGNOSIS — R296 Repeated falls: Secondary | ICD-10-CM | POA: Diagnosis not present

## 2022-05-02 DIAGNOSIS — G4733 Obstructive sleep apnea (adult) (pediatric): Secondary | ICD-10-CM | POA: Diagnosis not present

## 2022-05-02 DIAGNOSIS — R531 Weakness: Secondary | ICD-10-CM | POA: Diagnosis not present

## 2022-05-02 DIAGNOSIS — H04129 Dry eye syndrome of unspecified lacrimal gland: Secondary | ICD-10-CM | POA: Diagnosis not present

## 2022-05-02 DIAGNOSIS — M6281 Muscle weakness (generalized): Secondary | ICD-10-CM | POA: Diagnosis not present

## 2022-05-02 DIAGNOSIS — I4891 Unspecified atrial fibrillation: Secondary | ICD-10-CM | POA: Diagnosis not present

## 2022-05-02 DIAGNOSIS — F03918 Unspecified dementia, unspecified severity, with other behavioral disturbance: Secondary | ICD-10-CM | POA: Diagnosis not present

## 2022-05-02 DIAGNOSIS — G629 Polyneuropathy, unspecified: Secondary | ICD-10-CM | POA: Diagnosis not present

## 2022-05-02 DIAGNOSIS — R339 Retention of urine, unspecified: Secondary | ICD-10-CM | POA: Diagnosis not present

## 2022-05-02 DIAGNOSIS — N400 Enlarged prostate without lower urinary tract symptoms: Secondary | ICD-10-CM | POA: Diagnosis not present

## 2022-05-02 DIAGNOSIS — Z7901 Long term (current) use of anticoagulants: Secondary | ICD-10-CM | POA: Diagnosis not present

## 2022-05-02 DIAGNOSIS — K5900 Constipation, unspecified: Secondary | ICD-10-CM | POA: Diagnosis not present

## 2022-05-02 DIAGNOSIS — Z8673 Personal history of transient ischemic attack (TIA), and cerebral infarction without residual deficits: Secondary | ICD-10-CM | POA: Diagnosis not present

## 2022-05-04 DIAGNOSIS — G4733 Obstructive sleep apnea (adult) (pediatric): Secondary | ICD-10-CM | POA: Diagnosis not present

## 2022-05-05 ENCOUNTER — Encounter (INDEPENDENT_AMBULATORY_CARE_PROVIDER_SITE_OTHER): Payer: Self-pay | Admitting: Vascular Surgery

## 2022-05-05 ENCOUNTER — Ambulatory Visit: Payer: Medicare HMO | Attending: Medical | Admitting: Medical

## 2022-05-05 ENCOUNTER — Encounter: Payer: Self-pay | Admitting: Medical

## 2022-05-05 ENCOUNTER — Ambulatory Visit (INDEPENDENT_AMBULATORY_CARE_PROVIDER_SITE_OTHER): Payer: Medicare HMO | Admitting: Vascular Surgery

## 2022-05-05 VITALS — BP 122/80 | HR 93 | Resp 16 | Wt 183.6 lb

## 2022-05-05 VITALS — BP 122/60 | HR 94 | Ht 73.0 in | Wt 184.0 lb

## 2022-05-05 DIAGNOSIS — I428 Other cardiomyopathies: Secondary | ICD-10-CM

## 2022-05-05 DIAGNOSIS — I2510 Atherosclerotic heart disease of native coronary artery without angina pectoris: Secondary | ICD-10-CM | POA: Diagnosis not present

## 2022-05-05 DIAGNOSIS — I10 Essential (primary) hypertension: Secondary | ICD-10-CM

## 2022-05-05 DIAGNOSIS — E782 Mixed hyperlipidemia: Secondary | ICD-10-CM

## 2022-05-05 DIAGNOSIS — Z79899 Other long term (current) drug therapy: Secondary | ICD-10-CM | POA: Diagnosis not present

## 2022-05-05 DIAGNOSIS — I5022 Chronic systolic (congestive) heart failure: Secondary | ICD-10-CM

## 2022-05-05 DIAGNOSIS — I4821 Permanent atrial fibrillation: Secondary | ICD-10-CM | POA: Diagnosis not present

## 2022-05-05 DIAGNOSIS — Z01810 Encounter for preprocedural cardiovascular examination: Secondary | ICD-10-CM

## 2022-05-05 DIAGNOSIS — I7143 Infrarenal abdominal aortic aneurysm, without rupture: Secondary | ICD-10-CM | POA: Diagnosis not present

## 2022-05-05 DIAGNOSIS — I4891 Unspecified atrial fibrillation: Secondary | ICD-10-CM

## 2022-05-05 MED ORDER — ATORVASTATIN CALCIUM 40 MG PO TABS: 40.0000 mg | ORAL_TABLET | Freq: Every day | ORAL | 1 refills | Status: DC

## 2022-05-05 MED ORDER — METOPROLOL SUCCINATE ER 50 MG PO TB24: 50.0000 mg | ORAL_TABLET | Freq: Two times a day (BID) | ORAL | 1 refills | Status: DC

## 2022-05-05 MED ORDER — DAPAGLIFLOZIN PROPANEDIOL 10 MG PO TABS: 10.0000 mg | ORAL_TABLET | Freq: Every day | ORAL | 1 refills | Status: DC

## 2022-05-05 MED ORDER — LOSARTAN POTASSIUM 25 MG PO TABS: 25.0000 mg | ORAL_TABLET | Freq: Every day | ORAL | 1 refills | Status: DC

## 2022-05-05 MED ORDER — RIVAROXABAN 20 MG PO TABS: ORAL_TABLET | ORAL | 1 refills | Status: DC

## 2022-05-05 MED ORDER — DIGOXIN 250 MCG PO TABS: 0.2500 mg | ORAL_TABLET | Freq: Every day | ORAL | 1 refills | Status: DC

## 2022-05-05 NOTE — Assessment & Plan Note (Signed)
blood pressure control important in reducing the progression of atherosclerotic disease. On appropriate oral medications.  

## 2022-05-05 NOTE — Progress Notes (Signed)
MRN : [id number]  Gary Dawson is a 82 y.o. (1940/12/20) male who presents with chief complaint of  Chief Complaint  Patient presents with   Follow-up    2 week follow up  .  History of Present Illness: Patient returns today in follow up of his AAA.  He was seen while he was in the hospital a couple of weeks ago for this aneurysm.  He has 2 daughters accompanying him today.  I have rereviewed his CT scan of his abdomen and pelvis and shown this to he and his family today.  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  The mid and proximal infrarenal aorta and the iliac arteries are calcific but not aneurysmal or particularly stenotic.  He is not currently having any aneurysm related symptoms. Specifically, the patient denies new back or abdominal pain, or signs of peripheral embolization   Current Outpatient Medications  Medication Sig Dispense Refill   atorvastatin (LIPITOR) 40 MG tablet Take 1 tablet (40 mg total) by mouth daily.     dapagliflozin propanediol (FARXIGA) 10 MG TABS tablet Take 1 tablet (10 mg total) by mouth daily. 30 tablet    digoxin (LANOXIN) 0.25 MG tablet Take 1 tablet (0.25 mg total) by mouth daily.     hydroxypropyl methylcellulose / hypromellose (ISOPTO TEARS / GONIOVISC) 2.5 % ophthalmic solution Place 1 drop into both eyes 2 (two) times daily as needed for dry eyes.     losartan (COZAAR) 25 MG tablet Take 1 tablet (25 mg total) by mouth daily.     metoprolol succinate (TOPROL-XL) 100 MG 24 hr tablet Take 1 tablet (100 mg total) by mouth 2 (two) times daily. Take with or immediately following a meal. 60 tablet 0   mirabegron ER (MYRBETRIQ) 50 MG TB24 tablet Take 1 tablet (50 mg total) by mouth daily. 90 tablet 3   rivaroxaban (XARELTO) 20 MG TABS tablet TAKE 1 TABLET EVERY DAY WITH SUPPER. He can proceed with AAA intervention at the discretion of vascular surgery.  Rivaroxaban should be held  for 2 days before the procedure and restarted as soon as it is safe to do so. 90 tablet 1   VITAMIN D, ERGOCALCIFEROL, PO Take 1,000 Int'l Units by mouth daily. (Patient not taking: Reported on 05/05/2022)     No current facility-administered medications for this visit.    Past Medical History:  Diagnosis Date   Atypical chest pain    a. 03/2012 St echo: nl EF, no wma's.   CVA (cerebral vascular accident)    Depression    Dysrhythmia    Elevated PSA    Essential hypertension    Hyperlipidemia    MVP (mitral valve prolapse)    Persistent atrial fibrillation    a. CHA2DS2VASc = 4-->chronic Xarelto.   Psoriasis    Reflux    Sleep apnea    Vitamin B 12 deficiency     Past Surgical History:  Procedure Laterality Date   COLONOSCOPY WITH PROPOFOL     CYSTOSCOPY WITH LITHOLAPAXY N/A 11/01/2018   Procedure: CYSTOSCOPY WITH LITHOLAPAXY;  Surgeon: [name], MD;  Location: [facility];  Service: Urology;  Laterality: N/A;   HERNIA REPAIR     HOLEP-LASER ENUCLEATION OF THE PROSTATE WITH MORCELLATION N/A 11/01/2018   Procedure: HOLEP-LASER ENUCLEATION OF THE PROSTATE WITH MORCELLATION;  Surgeon: [name], MD;  Location: [facility];  Service: Urology;  Laterality: N/A;  RIGHT/LEFT HEART CATH AND CORONARY ANGIOGRAPHY N/A 04/20/2022   Procedure: RIGHT/LEFT HEART CATH AND CORONARY ANGIOGRAPHY;  Surgeon: [name], MD;  Location: [facility];  Service: Cardiovascular;  Laterality: N/A;   TONSILLECTOMY     VARICOSE VEIN SURGERY       Social History   Tobacco Use   Smoking status: Never   Smokeless tobacco: Never  Vaping Use   Vaping Use: Never used  Substance Use Topics   Alcohol use: Yes    Alcohol/week: 1.0 standard drink of alcohol    Types: 1 Glasses of wine per week    Comment: social    Drug use: No      Family History  Problem Relation Age of Onset   Heart attack Father    Hypertension Father    Hyperlipidemia Father     Allergies  Allergen  Reactions   Other Other (See Comments)    Trees and grasses      REVIEW OF SYSTEMS (Negative unless checked)  Constitutional: Weight loss  Fever  Chills Cardiac: Chest pain   Chest pressure   Palpitations   Shortness of breath when laying flat   Shortness of breath at rest   Shortness of breath with exertion. Vascular:  Pain in legs with walking   Pain in legs at rest   Pain in legs when laying flat   Claudication   Pain in feet when walking  Pain in feet at rest  Pain in feet when laying flat   History of DVT   Phlebitis   Swelling in legs   Varicose veins   Non-healing ulcers Pulmonary:   Uses home oxygen   Productive cough   Hemoptysis   Wheeze  COPD   Asthma Neurologic:  Dizziness  Blackouts   Seizures   History of stroke   History of TIA  Aphasia   Temporary blindness   Dysphagia   Weakness or numbness in arms   Weakness or numbness in legs X positive for memory issues Musculoskeletal:  Arthritis   Joint swelling   Joint pain   Low back pain Hematologic:  Easy bruising  Easy bleeding   Hypercoagulable state   Anemic   Gastrointestinal:  Blood in stool   Vomiting blood  Gastroesophageal reflux/heartburn   Abdominal pain Genitourinary:  Chronic kidney disease   Difficult urination  Frequent urination  Burning with urination   Hematuria Skin:  Rashes   Ulcers   Wounds Psychological:  History of anxiety    History of major depression.  Physical Examination  BP 122/80 (BP Location: Right Arm)   Pulse 93   Resp 16   Wt 183 lb 9.6 oz (83.3 kg)   BMI 24.22 kg/m  Gen:  WD/WN, NAD Head: [place]/AT, No temporalis wasting. Ear/Nose/Throat: Hearing grossly intact, nares w/o erythema or drainage Eyes: Conjunctiva clear. Sclera non-icteric Neck: Supple.  Trachea midline Pulmonary:  Good air movement, no use of accessory muscles.  Cardiac: RRR, no JVD Vascular:  Vessel Right  Left  Radial Palpable Palpable                          PT Palpable Palpable  DP Palpable Palpable   Gastrointestinal: soft, non-tender/non-distended. No guarding/reflex. Enlarged aortic impulse Musculoskeletal: M/S 5/5 throughout.  No deformity or atrophy. Trace LE edema. Neurologic: Sensation grossly intact in extremities.  Symmetrical.  Speech is fluent.  Psychiatric: Judgment and insight are fairly poor Dermatologic: No rashes or  ulcers noted.  No cellulitis or open wounds.      Labs Recent Results (from the past 2160 hour(s))  CBC with Differential     Status: Abnormal   Collection Time: 02/08/22  3:34 PM  Result Value Ref Range   WBC 5.6 4.0 - 10.5 K/uL   RBC 3.68 (L) 4.22 - 5.81 MIL/uL   Hemoglobin 12.7 (L) 13.0 - 17.0 g/dL   HCT 29.5 (L) 62.1 - 30.8 %   MCV 100.0 80.0 - 100.0 fL   MCH 34.5 (H) 26.0 - 34.0 pg   MCHC 34.5 30.0 - 36.0 g/dL   RDW 65.7 84.6 - 96.2 %   Platelets 165 150 - 400 K/uL   nRBC 0.0 0.0 - 0.2 %   Neutrophils Relative % 69 %   Neutro Abs 3.8 1.7 - 7.7 K/uL   Lymphocytes Relative 16 %   Lymphs Abs 0.9 0.7 - 4.0 K/uL   Monocytes Relative 12 %   Monocytes Absolute 0.7 0.1 - 1.0 K/uL   Eosinophils Relative 2 %   Eosinophils Absolute 0.1 0.0 - 0.5 K/uL   Basophils Relative 1 %   Basophils Absolute 0.0 0.0 - 0.1 K/uL   Immature Granulocytes 0 %   Abs Immature Granulocytes 0.02 0.00 - 0.07 K/uL    Comment: Performed at [facility], [address] [place], [place] [address]  Comprehensive metabolic panel     Status: Abnormal   Collection Time: 02/08/22  3:34 PM  Result Value Ref Range   Sodium 135 135 - 145 mmol/L   Potassium 3.7 3.5 - 5.1 mmol/L   Chloride 103 98 - 111 mmol/L   CO2 20 (L) 22 - 32 mmol/L   Glucose, Bld 115 (H) 70 - 99 mg/dL    Comment: Glucose reference range applies only to samples taken after fasting for at least 8 hours.   BUN 33 (H) 8 - 23 mg/dL   Creatinine, Ser 1.32 0.61 - 1.24 mg/dL   Calcium 8.6 (L)  8.9 - 10.3 mg/dL   Total Protein 7.3 6.5 - 8.1 g/dL   Albumin 3.6 3.5 - 5.0 g/dL   AST 35 15 - 41 U/L   ALT 26 0 - 44 U/L   Alkaline Phosphatase 82 38 - 126 U/L   Total Bilirubin 0.6 0.3 - 1.2 mg/dL   GFR, Estimated >44 >01 mL/min    Comment: (NOTE) Calculated using the CKD-EPI Creatinine Equation (2021)    Anion gap 12 5 - 15    Comment: Performed at [facility], [address] [place], [place] [address]  TSH     Status: None   Collection Time: 02/08/22  3:34 PM  Result Value Ref Range   TSH 1.540 0.350 - 4.500 uIU/mL    Comment: Performed by a 3rd Generation assay with a functional sensitivity of <=0.01 uIU/mL. Performed at [facility], [address] [place], [place] [address]   Sedimentation rate     Status: None   Collection Time: 02/08/22  3:34 PM  Result Value Ref Range   Sed Rate 12 0 - 20 mm/hr    Comment: Performed at [facility], [address], [place], [place] [address]  Vitamin B12     Status: None   Collection Time: 02/08/22  3:34 PM  Result Value Ref Range   Vitamin B-12 480 180 - 914 pg/mL    Comment: (NOTE) This assay is not validated for testing neonatal or myeloproliferative syndrome  specimens for Vitamin B12 levels. Performed at [facility], [address] [place], [place] [address]   ANA w/Reflex if Positive     Status: None   Collection Time: 02/08/22  3:34 PM  Result Value Ref Range   Anti Nuclear Antibody (ANA) Negative Negative    Comment: (NOTE) Performed At: [facility] [address] [place], [place] [id number] Jolene Schimke MD [id number]   Urinalysis, Complete     Status: None   Collection Time: 03/09/22  8:17 AM  Result Value Ref Range   Specific Gravity, UA 1.025 1.005 - 1.030   pH, UA 5.0 5.0 - 7.5   Color, UA Yellow Yellow   Appearance Ur Clear Clear   Leukocytes,UA Negative Negative   Protein,UA Negative Negative/Trace   Glucose, UA Negative Negative   Ketones, UA  Negative Negative   RBC, UA Negative Negative   Bilirubin, UA Negative Negative   Urobilinogen, Ur 1.0 0.2 - 1.0 mg/dL   Nitrite, UA Negative Negative   Microscopic Examination See below:   Microscopic Examination     Status: None   Collection Time: 03/09/22  8:17 AM   Urine  Result Value Ref Range   WBC, UA 0-5 0 - 5 /hpf   RBC, Urine 0-2 0 - 2 /hpf   Epithelial Cells (non renal) 0-10 0 - 10 /hpf   Bacteria, UA Few None seen/Few  Bladder Scan (Post Void Residual) in office     Status: None   Collection Time: 03/09/22  8:31 AM  Result Value Ref Range   Scan Result 0 ml   Bladder Scan (Post Void Residual) in office     Status: None   Collection Time: 03/30/22  9:15 AM  Result Value Ref Range   Scan Result 15 ml   CBC with Differential     Status: Abnormal   Collection Time: 04/18/22  2:46 PM  Result Value Ref Range   WBC 8.7 4.0 - 10.5 K/uL   RBC 3.73 (L) 4.22 - 5.81 MIL/uL   Hemoglobin 12.6 (L) 13.0 - 17.0 g/dL   HCT 03.4 (L) 74.2 - 59.5 %   MCV 100.3 (H) 80.0 - 100.0 fL   MCH 33.8 26.0 - 34.0 pg   MCHC 33.7 30.0 - 36.0 g/dL   RDW 63.8 75.6 - 43.3 %   Platelets 166 150 - 400 K/uL   nRBC 0.0 0.0 - 0.2 %   Neutrophils Relative % 77 %   Neutro Abs 6.7 1.7 - 7.7 K/uL   Lymphocytes Relative 9 %   Lymphs Abs 0.8 0.7 - 4.0 K/uL   Monocytes Relative 14 %   Monocytes Absolute 1.2 (H) 0.1 - 1.0 K/uL   Eosinophils Relative 0 %   Eosinophils Absolute 0.0 0.0 - 0.5 K/uL   Basophils Relative 0 %   Basophils Absolute 0.0 0.0 - 0.1 K/uL   Immature Granulocytes 0 %   Abs Immature Granulocytes 0.03 0.00 - 0.07 K/uL    Comment: Performed at [facility], [address] [place], [place] [address]  Comprehensive metabolic panel     Status: Abnormal   Collection Time: 04/18/22  2:46 PM  Result Value Ref Range   Sodium 137 135 - 145 mmol/L   Potassium 4.1 3.5 - 5.1 mmol/L   Chloride 102 98 - 111 mmol/L   CO2 25 22 - 32 mmol/L   Glucose, Bld 112 (H) 70 - 99 mg/dL     Comment: Glucose reference range applies only  to samples taken after fasting for at least 8 hours.   BUN 27 (H) 8 - 23 mg/dL   Creatinine, Ser 1.61 0.61 - 1.24 mg/dL   Calcium 8.9 8.9 - 09.6 mg/dL   Total Protein 7.8 6.5 - 8.1 g/dL   Albumin 3.8 3.5 - 5.0 g/dL   AST 63 (H) 15 - 41 U/L   ALT 34 0 - 44 U/L   Alkaline Phosphatase 56 38 - 126 U/L   Total Bilirubin 1.8 (H) 0.3 - 1.2 mg/dL   GFR, Estimated >04 >54 mL/min    Comment: (NOTE) Calculated using the CKD-EPI Creatinine Equation (2021)    Anion gap 10 5 - 15    Comment: Performed at [facility], [address] [place], [place] [address]  Troponin I (High Sensitivity)     Status: Abnormal   Collection Time: 04/18/22  2:46 PM  Result Value Ref Range   Troponin I (High Sensitivity) 19 (H) <18 ng/L    Comment: (NOTE) Elevated high sensitivity troponin I (hsTnI) values and significant  changes across serial measurements may suggest ACS but many other  chronic and acute conditions are known to elevate hsTnI results.  Refer to the "Links" section for chest pain algorithms and additional  guidance. Performed at [facility], [address] [place], [place] [address]   Lactic acid, plasma     Status: None   Collection Time: 04/18/22  5:00 PM  Result Value Ref Range   Lactic Acid, Venous 1.7 0.5 - 1.9 mmol/L    Comment: Performed at [facility], [address] [place], [place] [address]  Urinalysis, Routine w reflex microscopic -Urine, Catheterized; In and out catheter     Status: Abnormal   Collection Time: 04/18/22  5:00 PM  Result Value Ref Range   Color, Urine AMBER (A) YELLOW    Comment: BIOCHEMICALS MAY BE AFFECTED BY COLOR   APPearance HAZY (A) CLEAR   Specific Gravity, Urine 1.024 1.005 - 1.030   pH 5.0 5.0 - 8.0   Glucose, UA NEGATIVE NEGATIVE mg/dL   Hgb urine dipstick NEGATIVE NEGATIVE   Bilirubin Urine NEGATIVE NEGATIVE   Ketones, ur NEGATIVE NEGATIVE mg/dL   Protein, ur 130 (A)  NEGATIVE mg/dL   Nitrite NEGATIVE NEGATIVE   Leukocytes,Ua NEGATIVE NEGATIVE   RBC / HPF 0-5 0 - 5 RBC/hpf   WBC, UA 0-5 0 - 5 WBC/hpf   Bacteria, UA NONE SEEN NONE SEEN   Squamous Epithelial / HPF 0-5 0 - 5 /HPF   Mucus PRESENT     Comment: Performed at [facility], [address] [place], [place] [address]  Lactic acid, plasma     Status: None   Collection Time: 04/18/22  6:52 PM  Result Value Ref Range   Lactic Acid, Venous 1.3 0.5 - 1.9 mmol/L    Comment: Performed at [facility], [address] [place], [place] [address]  Vitamin B12     Status: Abnormal   Collection Time: 04/18/22  6:52 PM  Result Value Ref Range   Vitamin B-12 2,546 (H) 180 - 914 pg/mL    Comment: (NOTE) This assay is not validated for testing neonatal or myeloproliferative syndrome specimens for Vitamin B12 levels. Performed at [facility], [address] [place], [place] [address]   Basic metabolic panel     Status: Abnormal   Collection Time: 04/19/22  5:12 AM  Result Value Ref Range   Sodium 138 135 - 145  mmol/L   Potassium 3.7 3.5 - 5.1 mmol/L   Chloride 105 98 - 111 mmol/L   CO2 24 22 - 32 mmol/L   Glucose, Bld 104 (H) 70 - 99 mg/dL    Comment: Glucose reference range applies only to samples taken after fasting for at least 8 hours.   BUN 23 8 - 23 mg/dL   Creatinine, Ser 1.61 0.61 - 1.24 mg/dL   Calcium 8.4 (L) 8.9 - 10.3 mg/dL   GFR, Estimated >09 >60 mL/min    Comment: (NOTE) Calculated using the CKD-EPI Creatinine Equation (2021)    Anion gap 9 5 - 15    Comment: Performed at [facility], [address] [place], [place] [address]  CBC     Status: Abnormal   Collection Time: 04/19/22  5:12 AM  Result Value Ref Range   WBC 6.2 4.0 - 10.5 K/uL   RBC 3.45 (L) 4.22 - 5.81 MIL/uL   Hemoglobin 11.6 (L) 13.0 - 17.0 g/dL   HCT 81.1 (L) 91.4 - 78.2 %   MCV 100.9 (H) 80.0 - 100.0 fL   MCH 33.6 26.0 - 34.0 pg   MCHC 33.3 30.0 - 36.0 g/dL   RDW 95.6  21.3 - 08.6 %   Platelets 162 150 - 400 K/uL   nRBC 0.0 0.0 - 0.2 %    Comment: Performed at [facility], [address] [place], [place] [address]  Magnesium     Status: None   Collection Time: 04/19/22  5:12 AM  Result Value Ref Range   Magnesium 2.0 1.7 - 2.4 mg/dL    Comment: Performed at [facility], [address] [place], [place] [address]  APTT     Status: Abnormal   Collection Time: 04/19/22 10:08 AM  Result Value Ref Range   aPTT 45 (H) 24 - 36 seconds    Comment:        IF BASELINE aPTT IS ELEVATED, SUGGEST PATIENT RISK ASSESSMENT BE USED TO DETERMINE APPROPRIATE ANTICOAGULANT THERAPY. Performed at [facility], [address] [place], [place] [address]   Heparin level (unfractionated)     Status: Abnormal   Collection Time: 04/19/22 10:08 AM  Result Value Ref Range   Heparin Unfractionated >1.10 (H) 0.30 - 0.70 IU/mL    Comment: (NOTE) The clinical reportable range upper limit is being lowered to >1.10 to align with the FDA approved guidance for the current laboratory assay.  If heparin results are below expected values, and patient dosage has  been confirmed, suggest follow up testing of antithrombin III levels. Performed at [facility], [address] [place], [place] [address]   Protime-INR     Status: Abnormal   Collection Time: 04/19/22 10:08 AM  Result Value Ref Range   Prothrombin Time 30.1 (H) 11.4 - 15.2 seconds   INR 2.9 (H) 0.8 - 1.2    Comment: (NOTE) INR goal varies based on device and disease states. Performed at [facility], [address] [place], [place] [address]   ECHOCARDIOGRAM COMPLETE     Status: None   Collection Time: 04/19/22  1:07 PM  Result Value Ref Range   Weight 3,104.08 oz   Height 73 in   BP 132/87 mmHg   Ao pk vel 0.88 m/s   AV Area VTI 3.35 cm2   AR max vel 3.35 cm2   AV Mean grad 2.0 mmHg   AV Peak grad 3.1 mmHg   Single Plane A2C EF 44.7 %  Single Plane  A4C EF 33.9 %   Calc EF 39.5 %   S' Lateral 4.90 cm   AV Area mean vel 3.27 cm2   Area-P 1/2 6.43 cm2   P 1/2 time 595 msec   Est EF 30 - 35%   Troponin I (High Sensitivity)     Status: None   Collection Time: 04/19/22  2:08 PM  Result Value Ref Range   Troponin I (High Sensitivity) 16 <18 ng/L    Comment: (NOTE) Elevated high sensitivity troponin I (hsTnI) values and significant  changes across serial measurements may suggest ACS but many other  chronic and acute conditions are known to elevate hsTnI results.  Refer to the "Links" section for chest pain algorithms and additional  guidance. Performed at [facility], [address] [place], [place] [address]   Heparin level (unfractionated)     Status: Abnormal   Collection Time: 04/20/22  7:11 AM  Result Value Ref Range   Heparin Unfractionated >1.10 (H) 0.30 - 0.70 IU/mL    Comment: (NOTE) The clinical reportable range upper limit is being lowered to >1.10 to align with the FDA approved guidance for the current laboratory assay.  If heparin results are below expected values, and patient dosage has  been confirmed, suggest follow up testing of antithrombin III levels. Performed at [facility], [address] [place], [place] [address]   APTT     Status: Abnormal   Collection Time: 04/20/22  7:11 AM  Result Value Ref Range   aPTT 69 (H) 24 - 36 seconds    Comment:        IF BASELINE aPTT IS ELEVATED, SUGGEST PATIENT RISK ASSESSMENT BE USED TO DETERMINE APPROPRIATE ANTICOAGULANT THERAPY. Performed at [facility], [address] [place], [place] [address]   CBC     Status: Abnormal   Collection Time: 04/20/22  7:11 AM  Result Value Ref Range   WBC 5.6 4.0 - 10.5 K/uL   RBC 3.58 (L) 4.22 - 5.81 MIL/uL   Hemoglobin 12.1 (L) 13.0 - 17.0 g/dL   HCT 91.4 (L) 78.2 - 95.6 %   MCV 100.0 80.0 - 100.0 fL   MCH 33.8 26.0 - 34.0 pg   MCHC 33.8 30.0 - 36.0 g/dL   RDW 21.3 08.6 - 57.8 %    Platelets 168 150 - 400 K/uL   nRBC 0.0 0.0 - 0.2 %    Comment: Performed at [facility], [address] [place], [place] [address]  POCT I-Stat EG7     Status: Abnormal   Collection Time: 04/20/22  2:14 PM  Result Value Ref Range   pH, Ven 7.429 7.25 - 7.43   pCO2, Ven 36.4 (L) 44 - 60 mmHg   pO2, Ven 30 (LL) 32 - 45 mmHg   Bicarbonate 24.1 20.0 - 28.0 mmol/L   TCO2 25 22 - 32 mmol/L   O2 Saturation 59 %   Acid-Base Excess 0.0 0.0 - 2.0 mmol/L   Sodium 138 135 - 145 mmol/L   Potassium 4.1 3.5 - 5.1 mmol/L   Calcium, Ion 1.23 1.15 - 1.40 mmol/L   HCT 34.0 (L) 39.0 - 52.0 %   Hemoglobin 11.6 (L) 13.0 - 17.0 g/dL   Sample type MIXED VENOUS SAMPLE    Comment NOTIFIED PHYSICIAN   I-STAT 7, (LYTES, BLD GAS, ICA, H+H)     Status: Abnormal   Collection Time: 04/20/22  2:17 PM  Result Value Ref Range  pH, Arterial 7.457 (H) 7.35 - 7.45   pCO2 arterial 31.2 (L) 32 - 48 mmHg   pO2, Arterial 71 (L) 83 - 108 mmHg   Bicarbonate 22.1 20.0 - 28.0 mmol/L   TCO2 23 22 - 32 mmol/L   O2 Saturation 95 %   Acid-base deficit 1.0 0.0 - 2.0 mmol/L   Sodium 137 135 - 145 mmol/L   Potassium 4.1 3.5 - 5.1 mmol/L   Calcium, Ion 1.21 1.15 - 1.40 mmol/L   HCT 34.0 (L) 39.0 - 52.0 %   Hemoglobin 11.6 (L) 13.0 - 17.0 g/dL   Sample type ARTERIAL   APTT     Status: Abnormal   Collection Time: 04/21/22  2:01 AM  Result Value Ref Range   aPTT 89 (H) 24 - 36 seconds    Comment:        IF BASELINE aPTT IS ELEVATED, SUGGEST PATIENT RISK ASSESSMENT BE USED TO DETERMINE APPROPRIATE ANTICOAGULANT THERAPY. Performed at [facility], [address] [place], [place] [address]   CBC     Status: Abnormal   Collection Time: 04/21/22  4:28 AM  Result Value Ref Range   WBC 5.4 4.0 - 10.5 K/uL   RBC 3.59 (L) 4.22 - 5.81 MIL/uL   Hemoglobin 12.1 (L) 13.0 - 17.0 g/dL   HCT 60.4 (L) 54.0 - 98.1 %   MCV 99.4 80.0 - 100.0 fL   MCH 33.7 26.0 - 34.0 pg   MCHC 33.9 30.0 - 36.0 g/dL   RDW 19.1  47.8 - 29.5 %   Platelets 181 150 - 400 K/uL   nRBC 0.0 0.0 - 0.2 %    Comment: Performed at [facility], [address] [place], [place] [address]  Heparin level (unfractionated)     Status: None   Collection Time: 04/21/22 10:04 AM  Result Value Ref Range   Heparin Unfractionated 0.61 0.30 - 0.70 IU/mL    Comment: (NOTE) The clinical reportable range upper limit is being lowered to >1.10 to align with the FDA approved guidance for the current laboratory assay.  If heparin results are below expected values, and patient dosage has  been confirmed, suggest follow up testing of antithrombin III levels. Performed at [facility], [address] [place], [place] [address]   APTT     Status: Abnormal   Collection Time: 04/21/22 10:04 AM  Result Value Ref Range   aPTT 82 (H) 24 - 36 seconds    Comment:        IF BASELINE aPTT IS ELEVATED, SUGGEST PATIENT RISK ASSESSMENT BE USED TO DETERMINE APPROPRIATE ANTICOAGULANT THERAPY. Performed at [facility], [address] [place], [place] [address]   Digoxin level     Status: Abnormal   Collection Time: 04/22/22  6:39 AM  Result Value Ref Range   Digoxin Level 0.2 (L) 0.8 - 2.0 ng/mL    Comment: Performed at [facility], [address] [place], [place] [address]  CBC     Status: Abnormal   Collection Time: 04/22/22  6:39 AM  Result Value Ref Range   WBC 5.7 4.0 - 10.5 K/uL   RBC 3.60 (L) 4.22 - 5.81 MIL/uL   Hemoglobin 12.1 (L) 13.0 - 17.0 g/dL   HCT 13.2 (L) 44.0 - 10.2 %   MCV 99.7 80.0 - 100.0 fL   MCH 33.6 26.0 - 34.0 pg   MCHC 33.7 30.0 - 36.0 g/dL   RDW 72.5 36.6 - 44.0 %  Platelets 189 150 - 400 K/uL   nRBC 0.0 0.0 - 0.2 %    Comment: Performed at [facility], [address] [place], [place] [address]  Basic metabolic panel     Status: Abnormal   Collection Time: 04/22/22  6:39 AM  Result Value Ref Range   Sodium 137 135 - 145 mmol/L   Potassium 3.8 3.5 - 5.1  mmol/L   Chloride 104 98 - 111 mmol/L   CO2 24 22 - 32 mmol/L   Glucose, Bld 87 70 - 99 mg/dL    Comment: Glucose reference range applies only to samples taken after fasting for at least 8 hours.   BUN 23 8 - 23 mg/dL   Creatinine, Ser 6.04 0.61 - 1.24 mg/dL   Calcium 8.5 (L) 8.9 - 10.3 mg/dL   GFR, Estimated >54 >09 mL/min    Comment: (NOTE) Calculated using the CKD-EPI Creatinine Equation (2021)    Anion gap 9 5 - 15    Comment: Performed at [facility], [address] [place], [place] [address]  Basic metabolic panel     Status: Abnormal   Collection Time: 04/23/22  6:13 AM  Result Value Ref Range   Sodium 138 135 - 145 mmol/L   Potassium 3.9 3.5 - 5.1 mmol/L   Chloride 105 98 - 111 mmol/L   CO2 27 22 - 32 mmol/L   Glucose, Bld 89 70 - 99 mg/dL    Comment: Glucose reference range applies only to samples taken after fasting for at least 8 hours.   BUN 20 8 - 23 mg/dL   Creatinine, Ser 4.78 0.61 - 1.24 mg/dL   Calcium 8.6 (L) 8.9 - 10.3 mg/dL   GFR, Estimated >29 >56 mL/min    Comment: (NOTE) Calculated using the CKD-EPI Creatinine Equation (2021)    Anion gap 6 5 - 15    Comment: Performed at [facility], [address] [place], [place] [address]  Basic metabolic panel     Status: Abnormal   Collection Time: 04/24/22  6:24 AM  Result Value Ref Range   Sodium 138 135 - 145 mmol/L   Potassium 4.2 3.5 - 5.1 mmol/L   Chloride 106 98 - 111 mmol/L   CO2 27 22 - 32 mmol/L   Glucose, Bld 94 70 - 99 mg/dL    Comment: Glucose reference range applies only to samples taken after fasting for at least 8 hours.   BUN 22 8 - 23 mg/dL   Creatinine, Ser 6.57 0.61 - 1.24 mg/dL   Calcium 8.7 (L) 8.9 - 10.3 mg/dL   GFR, Estimated >84 >69 mL/min    Comment: (NOTE) Calculated using the CKD-EPI Creatinine Equation (2021)    Anion gap 5 5 - 15    Comment: Performed at [facility], [address] [place], [place] [address]  Digoxin level     Status:  Abnormal   Collection Time: 04/24/22  6:24 AM  Result Value Ref Range   Digoxin Level 0.5 (L) 0.8 - 2.0 ng/mL    Comment: Performed at [facility], [address] [place], [place] [address]  Basic metabolic panel     Status: Abnormal   Collection Time: 04/25/22  5:35 AM  Result Value Ref Range   Sodium 137 135 - 145 mmol/L   Potassium 3.8 3.5 - 5.1 mmol/L   Chloride 107 98 - 111 mmol/L   CO2 24 22 - 32 mmol/L   Glucose, Bld 141 (H)  70 - 99 mg/dL    Comment: Glucose reference range applies only to samples taken after fasting for at least 8 hours.   BUN 26 (H) 8 - 23 mg/dL   Creatinine, Ser 1.61 0.61 - 1.24 mg/dL   Calcium 8.5 (L) 8.9 - 10.3 mg/dL   GFR, Estimated >09 >60 mL/min    Comment: (NOTE) Calculated using the CKD-EPI Creatinine Equation (2021)    Anion gap 6 5 - 15    Comment: Performed at [facility], [address], [place], [place] [address]    Radiology CARDIAC CATHETERIZATION  Result Date: 04/20/2022   RPDA lesion is 100% stenosed.   1st Diag lesion is 40% stenosed.   Mid LAD to Dist LAD lesion is 30% stenosed. 1.  Heavily calcified coronary arteries with mild to moderate nonobstructive coronary artery disease with the exception of an occluded proximal right PDA with left-to-right collaterals. 2.  Left ventricular angiography was not performed.  EF was moderately to severely reduced by echo. 3.  Right heart catheterization showed mildly elevated left-sided filling pressures, mild pulmonary hypertension and normal cardiac output. 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 His coronary arteries are heavily calcified but with no obstructive disease and does not require revascularization.  Recommend aggressive medical therapy.\ I increased the dose of Toprol for better rate control of atrial fibrillation. Heparin can be resumed at 6 PM and can be transition back to Xarelto if no plans for AAA repair during this admission.  The patient is considered at moderate risk from a cardiac standpoint.   ECHOCARDIOGRAM COMPLETE  Result Date: 04/19/2022    ECHOCARDIOGRAM REPORT   Patient Name:   GORMAN SAFI [facility] Date of Exam: 04/19/2022 Medical Rec #:  [id number]              Height:       73.0 in Accession #:    [id number]             Weight:       194.0 lb Date of Birth:  September 26, 1940              BSA:          2.124 m Patient Age:    81 years               BP:           132/87 mmHg Patient Gender: M                      HR:           88 bpm. Exam Location:  [facility] Procedure: 2D Echo, Cardiac Doppler and Color Doppler Indications:     Atrial Fibrillation I48.91  History:         Patient has prior history of Echocardiogram examinations, most                  recent 05/19/2020. Mitral Valve Prolapse; Risk                  Factors:Hypertension. Persistent Afib.  Sonographer:     Cristela Blue Referring Phys:  [id number] AMY N COX Diagnosing Phys: Debbe Odea MD IMPRESSIONS  1. Left ventricular ejection fraction, by estimation, is 30 to 35%. The left ventricle has moderate to severely decreased function. The left ventricle demonstrates global hypokinesis. There is mild left ventricular hypertrophy. Left ventricular diastolic parameters are indeterminate.  2. Right ventricular systolic function is mildly reduced. The  right ventricular size is moderately enlarged. There is mildly elevated pulmonary artery systolic pressure.  3. Right atrial size was severely dilated.  4. The mitral valve is normal in structure. Mild mitral valve regurgitation.  5. Tricuspid valve regurgitation is moderate.  6. The aortic valve is tricuspid. Aortic valve regurgitation is mild. Aortic valve sclerosis/calcification is present, without any evidence of aortic stenosis.  7. Aortic dilatation noted. There is mild dilatation of the aortic root, measuring 40 mm.  8. The inferior vena cava is normal in size with <50% respiratory variability, suggesting right atrial pressure of  8 mmHg. FINDINGS  Left Ventricle: Left ventricular ejection fraction, by estimation, is 30 to 35%. The left ventricle has moderate to severely decreased function. The left ventricle demonstrates global hypokinesis. The left ventricular internal cavity size was normal in size. There is mild left ventricular hypertrophy. Left ventricular diastolic parameters are indeterminate. Right Ventricle: The right ventricular size is moderately enlarged. No increase in right ventricular wall thickness. Right ventricular systolic function is mildly reduced. There is mildly elevated pulmonary artery systolic pressure. The tricuspid regurgitant velocity is 2.84 m/s, and with an assumed right atrial pressure of 8 mmHg, the estimated right ventricular systolic pressure is 40.3 mmHg. Left Atrium: Left atrial size was normal in size. Right Atrium: Right atrial size was severely dilated. Pericardium: There is no evidence of pericardial effusion. Mitral Valve: The mitral valve is normal in structure. Mild mitral valve regurgitation. Tricuspid Valve: The tricuspid valve is normal in structure. Tricuspid valve regurgitation is moderate. Aortic Valve: The aortic valve is tricuspid. Aortic valve regurgitation is mild. Aortic regurgitation PHT measures 595 msec. Aortic valve sclerosis/calcification is present, without any evidence of aortic stenosis. Aortic valve mean gradient measures 2.0  mmHg. Aortic valve peak gradient measures 3.1 mmHg. Aortic valve area, by VTI measures 3.35 cm. Pulmonic Valve: The pulmonic valve was normal in structure. Pulmonic valve regurgitation is mild. Aorta: Aortic dilatation noted. There is mild dilatation of the aortic root, measuring 40 mm. Venous: The inferior vena cava is normal in size with less than 50% respiratory variability, suggesting right atrial pressure of 8 mmHg. IAS/Shunts: No atrial level shunt detected by color flow Doppler.  LEFT VENTRICLE PLAX 2D LVIDd:         5.30 cm LVIDs:         4.90 cm  LV PW:         1.10 cm LV IVS:        1.20 cm LVOT diam:     2.30 cm LV SV:         52 LV SV Index:   24 LVOT Area:     4.15 cm  LV Volumes (MOD) LV vol d, MOD A2C: 99.4 ml LV vol d, MOD A4C: 109.2 ml LV vol s, MOD A2C: 55.0 ml LV vol s, MOD A4C: 72.2 ml LV SV MOD A2C:     44.4 ml LV SV MOD A4C:     109.2 ml LV SV MOD BP:      43.1 ml RIGHT VENTRICLE RV Basal diam:  5.60 cm RV Mid diam:    3.20 cm RV S prime:     7.83 cm/s TAPSE (M-mode): 1.1 cm LEFT ATRIUM             Index        RIGHT ATRIUM           Index LA diam:        4.50 cm 2.12 cm/m  RA Area:     36.40 cm LA Vol (A2C):   45.3 ml 21.33 ml/m  RA Volume:   151.00 ml 71.09 ml/m LA Vol (A4C):   47.5 ml 22.36 ml/m LA Biplane Vol: 50.7 ml 23.87 ml/m  AORTIC VALVE AV Area (Vmax):    3.35 cm AV Area (Vmean):   3.27 cm AV Area (VTI):     3.35 cm AV Vmax:           88.15 cm/s AV Vmean:          59.250 cm/s AV VTI:            0.155 m AV Peak Grad:      3.1 mmHg AV Mean Grad:      2.0 mmHg LVOT Vmax:         71.10 cm/s LVOT Vmean:        46.600 cm/s LVOT VTI:          0.125 m LVOT/AV VTI ratio: 0.81 AI PHT:            595 msec  AORTA Ao Root diam: 4.07 cm MITRAL VALVE               TRICUSPID VALVE MV Area (PHT): 6.43 cm    TR Peak grad:   32.3 mmHg MV Decel Time: 118 msec    TR Vmax:        284.00 cm/s MV E velocity: 76.50 cm/s                            SHUNTS                            Systemic VTI:  0.12 m                            Systemic Diam: 2.30 cm Debbe Odea MD Electronically signed by Debbe Odea MD Signature Date/Time: 04/19/2022/1:45:20 PM    Final    DG Chest Port 1 View  Result Date: 04/18/2022 CLINICAL DATA:  Confusion, weakness EXAM: PORTABLE CHEST 1 VIEW COMPARISON:  09/29/2019, 04/18/2022 FINDINGS: Single frontal view of the chest demonstrates stable enlargement of the cardiac silhouette. No acute airspace disease, effusion, or pneumothorax. No acute bony abnormality. IMPRESSION: 1. Stable enlarged cardiac silhouette. 2.  No acute intrathoracic process. Electronically Signed   By: Sharlet Salina M.D.   On: 04/18/2022 18:24   CT ABDOMEN PELVIS W CONTRAST  Result Date: 04/18/2022 CLINICAL DATA:  Acute abdominal pain and low back pain after fall. EXAM: CT ABDOMEN AND PELVIS WITH CONTRAST TECHNIQUE: Multidetector CT imaging of the abdomen and pelvis was performed using the standard protocol following bolus administration of intravenous contrast. RADIATION DOSE REDUCTION: This exam was performed according to the departmental dose-optimization program which includes automated exposure control, adjustment of the mA and/or kV according to patient size and/or use of iterative reconstruction technique. CONTRAST:  OMNIPAQUE IOHEXOL 300 MG/ML  SOLN COMPARISON:  12/06/2009 FINDINGS: Despite efforts by the technologist and patient, motion artifact is present on today's exam and could not be eliminated. This reduces exam sensitivity and specificity. Lower chest: Moderate cardiomegaly with atherosclerotic calcification of the left anterior descending, right, and circumflex coronary arteries as well as the descending thoracic aorta. 5 by 4 mm right middle lobe pulmonary nodule on image 1 series 4, not included on the  prior CT of 12/06/2009. No follow-up imaging of this nodule is recommended. JACR 2018 Feb; 264-273, Management of the Incidental Renal Mass on CT, [employer] 2021; 814-848, Bosniak Classification of Cystic Renal Masses, Version 2019. Per Fleischner Society Guidelines, no routine follow-up imaging is recommended. These guidelines do not apply to immunocompromised patients and patients with cancer. Follow up in patients with significant comorbidities as clinically warranted. For lung cancer screening, adhere to Lung-RADS guidelines. Reference: Radiology. 2017; 284(1):228-43. Hepatobiliary: Unremarkable Pancreas: Unremarkable Spleen: Unremarkable Adrenals/Urinary Tract: Both adrenal glands appear normal. 2.3 by 2.0 cm exophytic  hypodense lesion of the right kidney upper pole with internal density of 14 Hounsfield units and relatively homogeneous density characteristics. No follow-up imaging of this cyst is recommended. JACR 2018 Feb; 264-273, Management of the Incidental Renal Mass on CT, [employer] 2021; 814-848, Bosniak Classification of Cystic Renal Masses, Version 2019. Additional small hypodense renal cysts in both kidneys noted. No further imaging workup of these lesions is indicated. Stomach/Bowel: Borderline prominence of gas and stool in the colon, query mild constipation. 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 The saccular portion of the aneurysm measures 4.6 by 2.9 by 4.0 cm, with a margin of thrombosis and a centrally enhancing component. If we include the abdominal aorta itself in the overall size determination, this measures 5.1 cm in maximum dimension. I do not see evidence of acute or recent retroperitoneal hemorrhage or current aortoduodenal fistula, although the duodenum abuts this saccular aneurysm. Vascular surgical consultation is recommended. Reference: J Vasc Surg 1610;96:0-45. 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 Mild residual asymmetry with the right peripheral prostate gland or protuberant than the left. Other: No supplemental non-categorized findings. Musculoskeletal: Please see dedicated spine CT reports. Moderate degenerative hip arthropathy bilaterally. IMPRESSION: 1. 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 Vascular surgical consultation is recommended. 2. Borderline prominence of gas and stool in the colon, query mild constipation. 3. Moderate cardiomegaly with  atherosclerotic calcification of the coronary arteries. 4. Moderate degenerative hip arthropathy bilaterally. 5. 5 by 4 mm right middle lobe pulmonary nodule. No follow-up imaging of this nodule is recommended. 6. Aortic and coronary atherosclerosis. 7. 2.3 by 2.0 cm exophytic hypodense lesion of the right kidney upper pole with internal density of 14 Hounsfield units and relatively homogeneous density characteristics. No follow up of this lesion is recommended. Aortic Atherosclerosis (ICD10-I70.0). Electronically Signed   By: Gaylyn Rong M.D.   On: 04/18/2022 17:12   CT L-SPINE NO CHARGE  Result Date: 04/18/2022 CLINICAL DATA:  Fall. EXAM: CT LUMBAR SPINE WITHOUT CONTRAST TECHNIQUE: Multidetector CT imaging of the lumbar spine was performed without intravenous contrast administration. Multiplanar CT image reconstructions were also generated. RADIATION DOSE REDUCTION: This exam was performed according to the departmental dose-optimization program which includes automated exposure control, adjustment of the mA and/or kV according to patient size and/or use of iterative reconstruction technique. COMPARISON:  CT abdomen and pelvis 12/06/2009 FINDINGS: Segmentation: There is transitional lumbosacral anatomy. Designating the lowest ribs is T12, the transitional segment is a partially sacralized L5. Alignment: Mild lumbar levoscoliosis. Trace anterolisthesis of L3 on L4. Vertebrae: Mild T11 and L1 superior endplate compression fractures, chronic in appearance. Scattered Schmorl's nodes. No definite acute fracture. No destructive osseous lesion. Solid  bridging anterior vertebral osteophytes at T8-9 and T9-10. Paraspinal and other soft tissues: No acute abnormality in the paraspinal soft tissues. Remainder of the abdomen and pelvis reported separately. Disc levels: Disc bulging and posterior element hypertrophy result in mild spinal stenosis at L2-3 and likely moderate spinal stenosis at L3-4 and L4-5.  Mild-to-moderate multilevel neural foraminal stenosis. IMPRESSION: 1. No acute osseous abnormality identified in the lumbar spine. 2. 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 Electronically Signed   By: Sebastian Ache M.D.   On: 04/18/2022 17:11   CT HEAD WO CONTRAST ( )  Result Date: 04/18/2022 CLINICAL DATA:  Head trauma, moderate-severe; Neck trauma (Age >= 65y). EXAM: CT HEAD WITHOUT CONTRAST CT CERVICAL SPINE WITHOUT CONTRAST TECHNIQUE: Multidetector CT imaging of the head and cervical spine was performed following the standard protocol without intravenous contrast. Multiplanar CT image reconstructions of the cervical spine were also generated. RADIATION DOSE REDUCTION: This exam was performed according to the departmental dose-optimization program which includes automated exposure control, adjustment of the mA and/or kV according to patient size and/or use of iterative reconstruction technique. COMPARISON:  CT head and cervical spine 03/19/2020. FINDINGS: CT HEAD FINDINGS Brain: There is no evidence of an acute infarct, intracranial hemorrhage, mass, midline shift, or extra-axial fluid collection. Moderate ventriculomegaly is unchanged and may reflect central predominant cerebral atrophy or chronic communicating hydrocephalus. Cerebral white matter hypodensities are unchanged and nonspecific but compatible with mild chronic small vessel ischemic disease. 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 Vascular: Calcified atherosclerosis at the skull base. No hyperdense vessel. Skull: No acute fracture or suspicious osseous lesion. Sinuses/Orbits: Tiny mucous retention cyst in the left sphenoid sinus. Clear mastoid air cells. Bilateral cataract extraction. Other: None. CT CERVICAL SPINE FINDINGS Alignment: Unchanged trace retrolisthesis of C3 on C4. Skull base and vertebrae: Unchanged mild chronic C7 superior endplate compression fracture. No acute  fracture or suspicious osseous lesion. Soft tissues and spinal canal: No prevertebral fluid or swelling. No visible canal hematoma. Disc levels: Similar appearance of multilevel disc degeneration, with moderate disc space narrowing noted from C3-4 through C5-6. Disc bulging and uncovertebral spurring at these levels result in mild spinal stenosis an asymmetrically advanced right-sided neural foraminal stenosis. Upper chest: No mass or consolidation in the included lung apices. Other: None. IMPRESSION: 1. No evidence of acute intracranial abnormality. 2. Mild chronic small vessel ischemic disease and unchanged chronic ventriculomegaly. 3. No acute cervical spine fracture. Electronically Signed   By: Sebastian Ache M.D.   On: 04/18/2022 17:02   CT Cervical Spine Wo Contrast  Result Date: 04/18/2022 CLINICAL DATA:  Head trauma, moderate-severe; Neck trauma (Age >= 65y). EXAM: CT HEAD WITHOUT CONTRAST CT CERVICAL SPINE WITHOUT CONTRAST TECHNIQUE: Multidetector CT imaging of the head and cervical spine was performed following the standard protocol without intravenous contrast. Multiplanar CT image reconstructions of the cervical spine were also generated. RADIATION DOSE REDUCTION: This exam was performed according to the departmental dose-optimization program which includes automated exposure control, adjustment of the mA and/or kV according to patient size and/or use of iterative reconstruction technique. COMPARISON:  CT head and cervical spine 03/19/2020. FINDINGS: CT HEAD FINDINGS Brain: There is no evidence of an acute infarct, intracranial hemorrhage, mass, midline shift, or extra-axial fluid collection. Moderate ventriculomegaly is unchanged and may reflect central predominant cerebral atrophy or chronic communicating hydrocephalus. Cerebral white matter hypodensities are unchanged and nonspecific but compatible with mild chronic small vessel ischemic disease. Small chronic bilateral cerebellar infarcts  are again  noted. Vascular: Calcified atherosclerosis at the skull base. No hyperdense vessel. Skull: No acute fracture or suspicious osseous lesion. Sinuses/Orbits: Tiny mucous retention cyst in the left sphenoid sinus. Clear mastoid air cells. Bilateral cataract extraction. Other: None. CT CERVICAL SPINE FINDINGS Alignment: Unchanged trace retrolisthesis of C3 on C4. Skull base and vertebrae: Unchanged mild chronic C7 superior endplate compression fracture. No acute fracture or suspicious osseous lesion. Soft tissues and spinal canal: No prevertebral fluid or swelling. No visible canal hematoma. Disc levels: Similar appearance of multilevel disc degeneration, with moderate disc space narrowing noted from C3-4 through C5-6. Disc bulging and uncovertebral spurring at these levels result in mild spinal stenosis an asymmetrically advanced right-sided neural foraminal stenosis. Upper chest: No mass or consolidation in the included lung apices. Other: None. IMPRESSION: 1. No evidence of acute intracranial abnormality. 2. Mild chronic small vessel ischemic disease and unchanged chronic ventriculomegaly. 3. No acute cervical spine fracture. Electronically Signed   By: Sebastian Ache M.D.   On: 04/18/2022 17:02    Assessment/Plan  Benign essential hypertension blood pressure control important in reducing the progression of atherosclerotic disease. On appropriate oral medications.   Hyperlipidemia lipid control important in reducing the progression of atherosclerotic disease. Continue statin therapy   Abdominal aortic aneurysm (AAA) without rupture I have rereviewed his CT scan of his abdomen and pelvis and shown this to he and his family today.  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  The mid and proximal infrarenal aorta and the iliac arteries are calcific but not aneurysmal or particularly stenotic.  I had a long discussion with the patient and his  daughters today.  This is definitely a very concerning aneurysm given the large size of a saccular aneurysm.  This is at high risk of lethal rupture.  This would be a relatively straightforward repair with a stent graft and this is what I would recommend for him.  He has been evaluated by cardiology and is following up with them and we would appreciate their input for cardiac optimization prior to his surgery.  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    Festus Barren, MD  05/05/2022 2:47 PM    This note was created with Dragon medical transcription system.  Any errors from dictation are purely unintentional

## 2022-05-05 NOTE — Progress Notes (Unsigned)
Cardiology Office Note:    Date:  05/11/2022   ID:  [name], [name] [birth year], MRN [id number]  PCP:  Gracelyn Nurse, MD  [facility] Cardiologist:  Julien Nordmann, MD  [facility] Electrophysiologist:  None   Referring MD: Gracelyn Nurse, MD   Chief Complaint: Hospital follow-up  History of Present Illness:    Gary Dawson is a 82 y.o. 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  Patient presented to the ER from urgent care for frequent falls and altered mental status.  High-sensitivity troponin was 19.CTH was non-acute. CT abdomen/pelvis showed aortic aneurysm measuring up to 5.1cm. CT spine showed no acute fractures. He was noted to be in rapid afib started on IV dilt with improvement of heart rate. Echo showed reduced LVEF 30-35% and he was started on IV heparin. The patient underwent right and left cardiac cath showing RPDA lesion 100% stenosed with collaterals, no intervention was done. Overall it showed heavily calcified coronary arteries with mild to moderate nonobstructive CAD. EF was moderately to severely reduced by echo. Right heart cath shows mildly elevated left filling pressure, mild pulmonary HTN and normal cardiac output. Metoprolol dose was increased and they placed him as moderate risk for surgical intervention. The patient was started on digoxin. IV heparin was switched to Xarelto. He was discharged to SNF.   Today, the patient reports he has been stable since being home. No chest pain or shortness of breath. No lower leg edema. He is accompanied by two family members who have a lot of questions regarding hospitalization and treatment options. They reported Dr. Wyn Quaker is planning on AAA repair, but needs cardiology clearance prior to surgery. They are wanting to go the [facility] for a second opinion regarding PCI options and heart failure treatment and medications. They said the patient  was discharged from SNF 4 days ago and has not been on his medications since that time.   We reviewed patient discharge medications: Toprol 100mg  BID Xarelto 20mg  daily Farxiga 10mg  adaily Losartan 25mg  daily Digoxin 0.25mg  dig level Lipitor 40mg  daily  They report he is also on PRN: muscle spasms Baclofen 10mg  and Tramadol 50mg  daily   Given BP, unsure he will tolerate Toprol 100mg  BID and Losartan. May need to go back down on Toprol dose 50mg  BID.   Cath site stable  Past Medical History:  Diagnosis Date   Atypical chest pain    a. 03/2012 St echo: nl EF, no wma's.   CVA (cerebral vascular accident)    Depression    Dysrhythmia    Elevated PSA    Essential hypertension    Hyperlipidemia    MVP (mitral valve prolapse)    Persistent atrial fibrillation    a. CHA2DS2VASc = 4-->chronic Xarelto.   Psoriasis    Reflux    Sleep apnea    Vitamin B 12 deficiency     Past Surgical History:  Procedure Laterality Date   COLONOSCOPY WITH PROPOFOL     CYSTOSCOPY WITH LITHOLAPAXY N/A 11/01/2018   Procedure: CYSTOSCOPY WITH LITHOLAPAXY;  Surgeon: [name], MD;  Location: [facility] ORS;  Service: Urology;  Laterality: N/A;   HERNIA REPAIR     HOLEP-LASER ENUCLEATION OF THE PROSTATE WITH MORCELLATION N/A 11/01/2018   Procedure: HOLEP-LASER ENUCLEATION OF THE PROSTATE WITH MORCELLATION;  Surgeon: [name], MD;  Location: [facility] ORS;  Service: Urology;  Laterality: N/A;   RIGHT/LEFT HEART  CATH AND CORONARY ANGIOGRAPHY N/A 04/20/2022   Procedure: RIGHT/LEFT HEART CATH AND CORONARY ANGIOGRAPHY;  Surgeon: [name], MD;  Location: [facility] INVASIVE CV LAB;  Service: Cardiovascular;  Laterality: N/A;   TONSILLECTOMY     VARICOSE VEIN SURGERY      Current Medications: Current Meds  Medication Sig   baclofen (LIORESAL) 10 MG tablet Take 10 mg by mouth 3 (three) times daily.   traMADol (ULTRAM) 50 MG tablet Take by mouth every 6 (six) hours as needed.   [DISCONTINUED]  metoprolol succinate (TOPROL-XL) 100 MG 24 hr tablet Take 1 tablet (100 mg total) by mouth 2 (two) times daily. Take with or immediately following a meal. (Patient taking differently: Take 50 mg by mouth 2 (two) times daily. Take with or immediately following a meal.)     Allergies:   Other   Social History   Socioeconomic History   Marital status: Divorced    Spouse name: Not on file   Number of children: Not on file   Years of education: Not on file   Highest education level: Not on file  Occupational History   Not on file  Tobacco Use   Smoking status: Never   Smokeless tobacco: Never  Vaping Use   Vaping Use: Never used  Substance and Sexual Activity   Alcohol use: Yes    Alcohol/week: 1.0 standard drink of alcohol    Types: 1 Glasses of wine per week    Comment: social    Drug use: No   Sexual activity: Not Currently  Other Topics Concern   Not on file  Social History Narrative   Not on file   Social Determinants of Health   Financial Resource Strain: Not on file  Food Insecurity: No Food Insecurity (04/19/2022)   Hunger Vital Sign    Worried About Running Out of Food in the Last Year: Never true    Ran Out of Food in the Last Year: Never true  Transportation Needs: No Transportation Needs (04/19/2022)   PRAPARE - Administrator, Civil Service (Medical): No    Lack of Transportation (Non-Medical): No  Physical Activity: Not on file  Stress: Not on file  Social Connections: Not on file     Family History: The patient's family history includes Heart attack in his father; Hyperlipidemia in his father; Hypertension in his father.  ROS:   Please see the history of present illness.     All other systems reviewed and are negative.  EKGs/Labs/Other Studies Reviewed:    The following studies were reviewed today:  LHC 04/20/2022   RPDA lesion is 100% stenosed.   1st Diag lesion is 40% stenosed.   Mid LAD to Dist LAD lesion is 30% stenosed.   1.  Heavily  calcified coronary arteries with mild to moderate nonobstructive coronary artery disease with the exception of an occluded proximal right PDA with left-to-right collaterals. 2.  Left ventricular angiography was not performed.  EF was moderately to severely reduced by echo. 3.  Right heart catheterization showed mildly elevated left-sided filling pressures, mild pulmonary hypertension and normal cardiac output.   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 His coronary arteries are heavily calcified but with no obstructive disease and does not require revascularization.  Recommend aggressive medical therapy.   TTE 04/19/2022 1. Left ventricular ejection fraction, by estimation, is 30 to 35%. The  left ventricle has moderate to severely decreased function. The left  ventricle demonstrates global hypokinesis. There is  mild left ventricular  hypertrophy. Left ventricular  diastolic parameters are indeterminate.   2. Right ventricular systolic function is mildly reduced. The right  ventricular size is moderately enlarged. There is mildly elevated  pulmonary artery systolic pressure.   3. Right atrial size was severely dilated.   4. The mitral valve is normal in structure. Mild mitral valve  regurgitation.   5. Tricuspid valve regurgitation is moderate.   6. The aortic valve is tricuspid. Aortic valve regurgitation is mild.  Aortic valve sclerosis/calcification is present, without any evidence of  aortic stenosis.   7. Aortic dilatation noted. There is mild dilatation of the aortic root,  measuring 40 mm.   8. The inferior vena cava is normal in size with <50% respiratory  variability, suggesting right atrial pressure of 8 mmHg.   EKG:  EKG is ordered today.  The ekg ordered today demonstrates Afib, 95bpm, nonspecific ST changes  Recent Labs: 02/08/2022: TSH 1.540 04/18/2022: ALT 34 04/19/2022: Magnesium 2.0 04/22/2022: Hemoglobin 12.1; Platelets  189 05/10/2022: BUN 22; Creatinine, Ser 0.81; Potassium 4.3; Sodium 136  Recent Lipid Panel No results found for: "CHOL", "TRIG", "HDL", "CHOLHDL", "VLDL", "LDLCALC", "LDLDIRECT"   Physical Exam:    VS:  BP 122/60   Pulse 94   Ht  (1.854 m)   Wt 184 lb (83.5 kg)   SpO2 99%   BMI 24.28 kg/m     Wt Readings from Last 3 Encounters:  05/05/22 184 lb (83.5 kg)  05/05/22 183 lb 9.6 oz (83.3 kg)  04/26/22 154 lb 15.7 oz (70.3 kg)     GEN:  Well nourished, well developed in no acute distress HEENT: Normal NECK: No JVD; No carotid bruits LYMPHATICS: No lymphadenopathy CARDIAC: Irreg Irreg, no murmurs, rubs, gallops RESPIRATORY:  Clear to auscultation without rales, wheezing or rhonchi  ABDOMEN: Soft, non-tender, non-distended MUSCULOSKELETAL:  No edema; No deformity  SKIN: Warm and dry NEUROLOGIC:  Alert and oriented x 3 PSYCHIATRIC:  Normal affect   ASSESSMENT:    1. Permanent atrial fibrillation   2. Chronic systolic heart failure   3. NICM (nonischemic cardiomyopathy)   4. Infrarenal abdominal aortic aneurysm (AAA) without rupture   5. Medication management   6. Coronary artery disease involving native coronary artery of native heart without angina pectoris   7. Pre-operative cardiovascular examination    PLAN:    In order of problems listed above:  Permanent Afib The patient is in rate controlled Afib on EKG today. The patient was discharged from SNF and has not been on all his cardiac medications. He restarted Toprol  BID and rate and BP are OK. I will continue Toprol  BID. Continue digoxin, check a dig level today. Continue Xarelto for stroke ppx.   Chronic systolic heart failure NICM Recent echo showed worsening LVEF 30-35%, felt to be from tachycardia. Prior EF was 45%. Cardiac cath showed no PCI lesions, medical therapy was recommended. The patient is euvolemic on exam today. He has not been on all his cardiac medications. BP is normal. Re-start Toprol   BID, Losartan  daily, and Farxiga  daily. Unsure BP will tolerate further GDMT, will evaluate this at follow-up. Family is wanting second opinion from [facility] regarding heart failure medications.   CAD Recent cardiac cath showed heavily calcified coronary arteries with mild to moderate nonobstructive CAD and occluded proximal right PDA with L>R collaterals. Medical therapy was recommended. Cath site is stable.The patient denies chest pain. No ASA given Xarelto. I will refer patient to  cardiac rehab. Family is wanting a second opinion from [facility] regarding CAD treatment. Continue Lipitor and Toprol.   Pre-op for AAA Patient is able to walk 1-2 blocks. He can care for him self and do light work around the house. METs >4. He is moderate risk for endovascular repair. According to the RCRI he is Class IV risk with 15% 30 day risk of death, MI or cardiac arrest. OK to pursue AAA without further cardiac work-up.  Recommend holding Xarelto 2 days prior to surgery.  Disposition: Follow up in 2 week(s) with MD/APP    Signed, Kavita Bartl David Stall, PA-C  05/11/2022 1:55 PM    [facility]

## 2022-05-05 NOTE — H&P (View-Only) (Signed)
  MRN : [id number]  Gary Dawson is a 82 y.o. (01/07/1941) male who presents with chief complaint of  Chief Complaint  Patient presents with   Follow-up    2 week follow up  .  History of Present Illness: Patient returns today in follow up of his AAA.  He was seen while he was in the hospital a couple of weeks ago for this aneurysm.  He has 2 daughters accompanying him today.  I have rereviewed his CT scan of his abdomen and pelvis and shown this to he and his family today.  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  The mid and proximal infrarenal aorta and the iliac arteries are calcific but not aneurysmal or particularly stenotic.  He is not currently having any aneurysm related symptoms. Specifically, the patient denies new back or abdominal pain, or signs of peripheral embolization   Current Outpatient Medications  Medication Sig Dispense Refill   atorvastatin (LIPITOR) 40 MG tablet Take 1 tablet (40 mg total) by mouth daily.     dapagliflozin propanediol (FARXIGA) 10 MG TABS tablet Take 1 tablet (10 mg total) by mouth daily. 30 tablet    digoxin (LANOXIN) 0.25 MG tablet Take 1 tablet (0.25 mg total) by mouth daily.     hydroxypropyl methylcellulose / hypromellose (ISOPTO TEARS / GONIOVISC) 2.5 % ophthalmic solution Place 1 drop into both eyes 2 (two) times daily as needed for dry eyes.     losartan (COZAAR) 25 MG tablet Take 1 tablet (25 mg total) by mouth daily.     metoprolol succinate (TOPROL-XL) 100 MG 24 hr tablet Take 1 tablet (100 mg total) by mouth 2 (two) times daily. Take with or immediately following a meal. 60 tablet 0   mirabegron ER (MYRBETRIQ) 50 MG TB24 tablet Take 1 tablet (50 mg total) by mouth daily. 90 tablet 3   rivaroxaban (XARELTO) 20 MG TABS tablet TAKE 1 TABLET EVERY DAY WITH SUPPER. He can proceed with AAA intervention at the discretion of vascular surgery.  Rivaroxaban should be held  for 2 days before the procedure and restarted as soon as it is safe to do so. 90 tablet 1   VITAMIN D, ERGOCALCIFEROL, PO Take 1,000 Int'l Units by mouth daily. (Patient not taking: Reported on 05/05/2022)     No current facility-administered medications for this visit.    Past Medical History:  Diagnosis Date   Atypical chest pain    a. 03/2012 St echo: nl EF, no wma's.   CVA (cerebral vascular accident)    Depression    Dysrhythmia    Elevated PSA    Essential hypertension    Hyperlipidemia    MVP (mitral valve prolapse)    Persistent atrial fibrillation    a. CHA2DS2VASc = 4-->chronic Xarelto.   Psoriasis    Reflux    Sleep apnea    Vitamin B 12 deficiency     Past Surgical History:  Procedure Laterality Date   COLONOSCOPY WITH PROPOFOL     CYSTOSCOPY WITH LITHOLAPAXY N/A 11/01/2018   Procedure: CYSTOSCOPY WITH LITHOLAPAXY;  Surgeon: Sninsky, Brian C, MD;  Location: [facility];  Service: Urology;  Laterality: N/A;   HERNIA REPAIR     HOLEP-LASER ENUCLEATION OF THE PROSTATE WITH MORCELLATION N/A 11/01/2018   Procedure: HOLEP-LASER ENUCLEATION OF THE PROSTATE WITH MORCELLATION;  Surgeon: Sninsky, Brian C, MD;  Location: [facility] OR[facility];  Service: Urology;  Laterality: N/A;     RIGHT/LEFT HEART CATH AND CORONARY ANGIOGRAPHY N/A 04/20/2022   Procedure: RIGHT/LEFT HEART CATH AND CORONARY ANGIOGRAPHY;  Surgeon: Arida, Muhammad A, MD;  Location: [facility];  Service: Cardiovascular;  Laterality: N/A;   TONSILLECTOMY     VARICOSE VEIN SURGERY       Social History   Tobacco Use   Smoking status: Never   Smokeless tobacco: Never  Vaping Use   Vaping Use: Never used  Substance Use Topics   Alcohol use: Yes    Alcohol/week: 1.0 standard drink of alcohol    Types: 1 Glasses of wine per week    Comment: social    Drug use: No      Family History  Problem Relation Age of Onset   Heart attack Father    Hypertension Father    Hyperlipidemia Father     Allergies  Allergen  Reactions   Other Other (See Comments)    Trees and grasses      REVIEW OF SYSTEMS (Negative unless checked)  Constitutional: []Weight loss  []Fever  []Chills Cardiac: []Chest pain   []Chest pressure   []Palpitations   []Shortness of breath when laying flat   []Shortness of breath at rest   []Shortness of breath with exertion. Vascular:  []Pain in legs with walking   []Pain in legs at rest   []Pain in legs when laying flat   []Claudication   []Pain in feet when walking  []Pain in feet at rest  []Pain in feet when laying flat   []History of DVT   []Phlebitis   []Swelling in legs   []Varicose veins   []Non-healing ulcers Pulmonary:   []Uses home oxygen   []Productive cough   []Hemoptysis   []Wheeze  []COPD   []Asthma Neurologic:  []Dizziness  []Blackouts   []Seizures   []History of stroke   []History of TIA  []Aphasia   []Temporary blindness   []Dysphagia   []Weakness or numbness in arms   []Weakness or numbness in legs X positive for memory issues Musculoskeletal:  [x]Arthritis   []Joint swelling   []Joint pain   []Low back pain Hematologic:  []Easy bruising  []Easy bleeding   []Hypercoagulable state   []Anemic   Gastrointestinal:  []Blood in stool   []Vomiting blood  []Gastroesophageal reflux/heartburn   []Abdominal pain Genitourinary:  []Chronic kidney disease   []Difficult urination  []Frequent urination  []Burning with urination   []Hematuria Skin:  [x]Rashes   []Ulcers   []Wounds Psychological:  []History of anxiety   [] History of major depression.  Physical Examination  BP 122/80 (BP Location: Right Arm)   Pulse 93   Resp 16   Wt 183 lb 9.6 oz (83.3 kg)   BMI 24.22 kg/m  Gen:  WD/WN, NAD Head: [place]/AT, No temporalis wasting. Ear/Nose/Throat: Hearing grossly intact, nares w/o erythema or drainage Eyes: Conjunctiva clear. Sclera non-icteric Neck: Supple.  Trachea midline Pulmonary:  Good air movement, no use of accessory muscles.  Cardiac: RRR, no JVD Vascular:  Vessel Right  Left  Radial Palpable Palpable                          PT Palpable Palpable  DP Palpable Palpable   Gastrointestinal: soft, non-tender/non-distended. No guarding/reflex. Enlarged aortic impulse Musculoskeletal: M/S 5/5 throughout.  No deformity or atrophy. Trace LE edema. Neurologic: Sensation grossly intact in extremities.  Symmetrical.  Speech is fluent.  Psychiatric: Judgment and insight are fairly poor Dermatologic: No rashes or   ulcers noted.  No cellulitis or open wounds.      Labs Recent Results (from the past 2160 hour(s))  CBC with Differential     Status: Abnormal   Collection Time: 02/08/22  3:34 PM  Result Value Ref Range   WBC 5.6 4.0 - 10.5 K/uL   RBC 3.68 (L) 4.22 - 5.81 MIL/uL   Hemoglobin 12.7 (L) 13.0 - 17.0 g/dL   HCT 36.8 (L) 39.0 - 52.0 %   MCV 100.0 80.0 - 100.0 fL   MCH 34.5 (H) 26.0 - 34.0 pg   MCHC 34.5 30.0 - 36.0 g/dL   RDW 13.4 11.5 - 15.5 %   Platelets 165 150 - 400 K/uL   nRBC 0.0 0.0 - 0.2 %   Neutrophils Relative % 69 %   Neutro Abs 3.8 1.7 - 7.7 K/uL   Lymphocytes Relative 16 %   Lymphs Abs 0.9 0.7 - 4.0 K/uL   Monocytes Relative 12 %   Monocytes Absolute 0.7 0.1 - 1.0 K/uL   Eosinophils Relative 2 %   Eosinophils Absolute 0.1 0.0 - 0.5 K/uL   Basophils Relative 1 %   Basophils Absolute 0.0 0.0 - 0.1 K/uL   Immature Granulocytes 0 %   Abs Immature Granulocytes 0.02 0.00 - 0.07 K/uL    Comment: Performed at [facility], [address] [place], [place] [address]  Comprehensive metabolic panel     Status: Abnormal   Collection Time: 02/08/22  3:34 PM  Result Value Ref Range   Sodium 135 135 - 145 mmol/L   Potassium 3.7 3.5 - 5.1 mmol/L   Chloride 103 98 - 111 mmol/L   CO2 20 (L) 22 - 32 mmol/L   Glucose, Bld 115 (H) 70 - 99 mg/dL    Comment: Glucose reference range applies only to samples taken after fasting for at least 8 hours.   BUN 33 (H) 8 - 23 mg/dL   Creatinine, Ser 0.86 0.61 - 1.24 mg/dL   Calcium 8.6 (L)  8.9 - 10.3 mg/dL   Total Protein 7.3 6.5 - 8.1 g/dL   Albumin 3.6 3.5 - 5.0 g/dL   AST 35 15 - 41 U/L   ALT 26 0 - 44 U/L   Alkaline Phosphatase 82 38 - 126 U/L   Total Bilirubin 0.6 0.3 - 1.2 mg/dL   GFR, Estimated >60 >60 mL/min    Comment: (NOTE) Calculated using the CKD-EPI Creatinine Equation (2021)    Anion gap 12 5 - 15    Comment: Performed at [facility], [address] [place], [place] [address]  TSH     Status: None   Collection Time: 02/08/22  3:34 PM  Result Value Ref Range   TSH 1.540 0.350 - 4.500 uIU/mL    Comment: Performed by a 3rd Generation assay with a functional sensitivity of <=0.01 uIU/mL. Performed at [facility], [address] [place], [place] [address]   Sedimentation rate     Status: None   Collection Time: 02/08/22  3:34 PM  Result Value Ref Range   Sed Rate 12 0 - 20 mm/hr    Comment: Performed at [facility], [address] [place], [place] [address]  Vitamin B12     Status: None   Collection Time: 02/08/22  3:34 PM  Result Value Ref Range   Vitamin B-12 480 180 - 914 pg/mL    Comment: (NOTE) This assay is not validated for testing neonatal or myeloproliferative syndrome   specimens for Vitamin B12 levels. Performed at [facility], [address] [place], [place] [address]   ANA w/Reflex if Positive     Status: None   Collection Time: 02/08/22  3:34 PM  Result Value Ref Range   Anti Nuclear Antibody (ANA) Negative Negative    Comment: (NOTE) Performed At: [facility] [address] [place], [place] [id number] Nagendra Sanjai MD Ph:[phone]   Urinalysis, Complete     Status: None   Collection Time: 03/09/22  8:17 AM  Result Value Ref Range   Specific Gravity, UA 1.025 1.005 - 1.030   pH, UA 5.0 5.0 - 7.5   Color, UA Yellow Yellow   Appearance Ur Clear Clear   Leukocytes,UA Negative Negative   Protein,UA Negative Negative/Trace   Glucose, UA Negative Negative   Ketones, UA  Negative Negative   RBC, UA Negative Negative   Bilirubin, UA Negative Negative   Urobilinogen, Ur 1.0 0.2 - 1.0 mg/dL   Nitrite, UA Negative Negative   Microscopic Examination See below:   Microscopic Examination     Status: None   Collection Time: 03/09/22  8:17 AM   Urine  Result Value Ref Range   WBC, UA 0-5 0 - 5 /hpf   RBC, Urine 0-2 0 - 2 /hpf   Epithelial Cells (non renal) 0-10 0 - 10 /hpf   Bacteria, UA Few None seen/Few  Bladder Scan (Post Void Residual) in office     Status: None   Collection Time: 03/09/22  8:31 AM  Result Value Ref Range   Scan Result 0 ml   Bladder Scan (Post Void Residual) in office     Status: None   Collection Time: 03/30/22  9:15 AM  Result Value Ref Range   Scan Result 15 ml   CBC with Differential     Status: Abnormal   Collection Time: 04/18/22  2:46 PM  Result Value Ref Range   WBC 8.7 4.0 - 10.5 K/uL   RBC 3.73 (L) 4.22 - 5.81 MIL/uL   Hemoglobin 12.6 (L) 13.0 - 17.0 g/dL   HCT 37.4 (L) 39.0 - 52.0 %   MCV 100.3 (H) 80.0 - 100.0 fL   MCH 33.8 26.0 - 34.0 pg   MCHC 33.7 30.0 - 36.0 g/dL   RDW 13.2 11.5 - 15.5 %   Platelets 166 150 - 400 K/uL   nRBC 0.0 0.0 - 0.2 %   Neutrophils Relative % 77 %   Neutro Abs 6.7 1.7 - 7.7 K/uL   Lymphocytes Relative 9 %   Lymphs Abs 0.8 0.7 - 4.0 K/uL   Monocytes Relative 14 %   Monocytes Absolute 1.2 (H) 0.1 - 1.0 K/uL   Eosinophils Relative 0 %   Eosinophils Absolute 0.0 0.0 - 0.5 K/uL   Basophils Relative 0 %   Basophils Absolute 0.0 0.0 - 0.1 K/uL   Immature Granulocytes 0 %   Abs Immature Granulocytes 0.03 0.00 - 0.07 K/uL    Comment: Performed at [facility], [address] [place], [place] [address]  Comprehensive metabolic panel     Status: Abnormal   Collection Time: 04/18/22  2:46 PM  Result Value Ref Range   Sodium 137 135 - 145 mmol/L   Potassium 4.1 3.5 - 5.1 mmol/L   Chloride 102 98 - 111 mmol/L   CO2 25 22 - 32 mmol/L   Glucose, Bld 112 (H) 70 - 99 mg/dL     Comment: Glucose reference range applies only   to samples taken after fasting for at least 8 hours.   BUN 27 (H) 8 - 23 mg/dL   Creatinine, Ser 0.78 0.61 - 1.24 mg/dL   Calcium 8.9 8.9 - 10.3 mg/dL   Total Protein 7.8 6.5 - 8.1 g/dL   Albumin 3.8 3.5 - 5.0 g/dL   AST 63 (H) 15 - 41 U/L   ALT 34 0 - 44 U/L   Alkaline Phosphatase 56 38 - 126 U/L   Total Bilirubin 1.8 (H) 0.3 - 1.2 mg/dL   GFR, Estimated >60 >60 mL/min    Comment: (NOTE) Calculated using the CKD-EPI Creatinine Equation (2021)    Anion gap 10 5 - 15    Comment: Performed at [facility], [address] [place], [place] [address]  Troponin I (High Sensitivity)     Status: Abnormal   Collection Time: 04/18/22  2:46 PM  Result Value Ref Range   Troponin I (High Sensitivity) 19 (H) <18 ng/L    Comment: (NOTE) Elevated high sensitivity troponin I (hsTnI) values and significant  changes across serial measurements may suggest ACS but many other  chronic and acute conditions are known to elevate hsTnI results.  Refer to the "Links" section for chest pain algorithms and additional  guidance. Performed at [facility], [address] [place], [place] [address]   Lactic acid, plasma     Status: None   Collection Time: 04/18/22  5:00 PM  Result Value Ref Range   Lactic Acid, Venous 1.7 0.5 - 1.9 mmol/L    Comment: Performed at [facility], [address] [place], [place] [address]  Urinalysis, Routine w reflex microscopic -Urine, Catheterized; In and out catheter     Status: Abnormal   Collection Time: 04/18/22  5:00 PM  Result Value Ref Range   Color, Urine AMBER (A) YELLOW    Comment: BIOCHEMICALS MAY BE AFFECTED BY COLOR   APPearance HAZY (A) CLEAR   Specific Gravity, Urine 1.024 1.005 - 1.030   pH 5.0 5.0 - 8.0   Glucose, UA NEGATIVE NEGATIVE mg/dL   Hgb urine dipstick NEGATIVE NEGATIVE   Bilirubin Urine NEGATIVE NEGATIVE   Ketones, ur NEGATIVE NEGATIVE mg/dL   Protein, ur 100 (A)  NEGATIVE mg/dL   Nitrite NEGATIVE NEGATIVE   Leukocytes,Ua NEGATIVE NEGATIVE   RBC / HPF 0-5 0 - 5 RBC/hpf   WBC, UA 0-5 0 - 5 WBC/hpf   Bacteria, UA NONE SEEN NONE SEEN   Squamous Epithelial / HPF 0-5 0 - 5 /HPF   Mucus PRESENT     Comment: Performed at [facility], [address] [place], [place] [address]  Lactic acid, plasma     Status: None   Collection Time: 04/18/22  6:52 PM  Result Value Ref Range   Lactic Acid, Venous 1.3 0.5 - 1.9 mmol/L    Comment: Performed at [facility], [address] [place], [place] [address]  Vitamin B12     Status: Abnormal   Collection Time: 04/18/22  6:52 PM  Result Value Ref Range   Vitamin B-12 2,546 (H) 180 - 914 pg/mL    Comment: (NOTE) This assay is not validated for testing neonatal or myeloproliferative syndrome specimens for Vitamin B12 levels. Performed at [facility], [address] [place], [place] [address]   Basic metabolic panel     Status: Abnormal   Collection Time: 04/19/22  5:12 AM  Result Value Ref Range   Sodium 138 135 - 145   mmol/L   Potassium 3.7 3.5 - 5.1 mmol/L   Chloride 105 98 - 111 mmol/L   CO2 24 22 - 32 mmol/L   Glucose, Bld 104 (H) 70 - 99 mg/dL    Comment: Glucose reference range applies only to samples taken after fasting for at least 8 hours.   BUN 23 8 - 23 mg/dL   Creatinine, Ser 0.72 0.61 - 1.24 mg/dL   Calcium 8.4 (L) 8.9 - 10.3 mg/dL   GFR, Estimated >60 >60 mL/min    Comment: (NOTE) Calculated using the CKD-EPI Creatinine Equation (2021)    Anion gap 9 5 - 15    Comment: Performed at [facility], [address] [place], [place] [address]  CBC     Status: Abnormal   Collection Time: 04/19/22  5:12 AM  Result Value Ref Range   WBC 6.2 4.0 - 10.5 K/uL   RBC 3.45 (L) 4.22 - 5.81 MIL/uL   Hemoglobin 11.6 (L) 13.0 - 17.0 g/dL   HCT 34.8 (L) 39.0 - 52.0 %   MCV 100.9 (H) 80.0 - 100.0 fL   MCH 33.6 26.0 - 34.0 pg   MCHC 33.3 30.0 - 36.0 g/dL   RDW 13.1  11.5 - 15.5 %   Platelets 162 150 - 400 K/uL   nRBC 0.0 0.0 - 0.2 %    Comment: Performed at [facility], [address] [place], [place] [address]  Magnesium     Status: None   Collection Time: 04/19/22  5:12 AM  Result Value Ref Range   Magnesium 2.0 1.7 - 2.4 mg/dL    Comment: Performed at [facility], [address] [place], [place] [address]  APTT     Status: Abnormal   Collection Time: 04/19/22 10:08 AM  Result Value Ref Range   aPTT 45 (H) 24 - 36 seconds    Comment:        IF BASELINE aPTT IS ELEVATED, SUGGEST PATIENT RISK ASSESSMENT BE USED TO DETERMINE APPROPRIATE ANTICOAGULANT THERAPY. Performed at [facility], [address] [place], [place] [address]   Heparin level (unfractionated)     Status: Abnormal   Collection Time: 04/19/22 10:08 AM  Result Value Ref Range   Heparin Unfractionated >1.10 (H) 0.30 - 0.70 IU/mL    Comment: (NOTE) The clinical reportable range upper limit is being lowered to >1.10 to align with the FDA approved guidance for the current laboratory assay.  If heparin results are below expected values, and patient dosage has  been confirmed, suggest follow up testing of antithrombin III levels. Performed at [facility], [address] [place] , [place] [address]   Protime-INR     Status: Abnormal   Collection Time: 04/19/22 10:08 AM  Result Value Ref Range   Prothrombin Time 30.1 (H) 11.4 - 15.2 seconds   INR 2.9 (H) 0.8 - 1.2    Comment: (NOTE) INR goal varies based on device and disease states. Performed at [facility], [address] [place], [place] [address]   ECHOCARDIOGRAM COMPLETE     Status: None   Collection Time: 04/19/22  1:07 PM  Result Value Ref Range   Weight 3,104.08 oz   Height 73 in   BP 132/87 mmHg   Ao pk vel 0.88 m/s   AV Area VTI 3.35 cm2   AR max vel 3.35 cm2   AV Mean grad 2.0 mmHg   AV Peak grad 3.1 mmHg   Single Plane A2C EF 44.7 %     Single Plane  A4C EF 33.9 %   Calc EF 39.5 %   S' Lateral 4.90 cm   AV Area mean vel 3.27 cm2   Area-P 1/2 6.43 cm2   P 1/2 time 595 msec   Est EF 30 - 35%   Troponin I (High Sensitivity)     Status: None   Collection Time: 04/19/22  2:08 PM  Result Value Ref Range   Troponin I (High Sensitivity) 16 <18 ng/L    Comment: (NOTE) Elevated high sensitivity troponin I (hsTnI) values and significant  changes across serial measurements may suggest ACS but many other  chronic and acute conditions are known to elevate hsTnI results.  Refer to the "Links" section for chest pain algorithms and additional  guidance. Performed at [facility], [address] [place], [place] [address]   Heparin level (unfractionated)     Status: Abnormal   Collection Time: 04/20/22  7:11 AM  Result Value Ref Range   Heparin Unfractionated >1.10 (H) 0.30 - 0.70 IU/mL    Comment: (NOTE) The clinical reportable range upper limit is being lowered to >1.10 to align with the FDA approved guidance for the current laboratory assay.  If heparin results are below expected values, and patient dosage has  been confirmed, suggest follow up testing of antithrombin III levels. Performed at [facility], [address] [place], [place] [address]   APTT     Status: Abnormal   Collection Time: 04/20/22  7:11 AM  Result Value Ref Range   aPTT 69 (H) 24 - 36 seconds    Comment:        IF BASELINE aPTT IS ELEVATED, SUGGEST PATIENT RISK ASSESSMENT BE USED TO DETERMINE APPROPRIATE ANTICOAGULANT THERAPY. Performed at [facility], [address] [place], [place] [address]   CBC     Status: Abnormal   Collection Time: 04/20/22  7:11 AM  Result Value Ref Range   WBC 5.6 4.0 - 10.5 K/uL   RBC 3.58 (L) 4.22 - 5.81 MIL/uL   Hemoglobin 12.1 (L) 13.0 - 17.0 g/dL   HCT 35.8 (L) 39.0 - 52.0 %   MCV 100.0 80.0 - 100.0 fL   MCH 33.8 26.0 - 34.0 pg   MCHC 33.8 30.0 - 36.0 g/dL   RDW 13.1 11.5 - 15.5 %    Platelets 168 150 - 400 K/uL   nRBC 0.0 0.0 - 0.2 %    Comment: Performed at [facility], [address] [place], [place] [address]  POCT I-Stat EG7     Status: Abnormal   Collection Time: 04/20/22  2:14 PM  Result Value Ref Range   pH, Ven 7.429 7.25 - 7.43   pCO2, Ven 36.4 (L) 44 - 60 mmHg   pO2, Ven 30 (LL) 32 - 45 mmHg   Bicarbonate 24.1 20.0 - 28.0 mmol/L   TCO2 25 22 - 32 mmol/L   O2 Saturation 59 %   Acid-Base Excess 0.0 0.0 - 2.0 mmol/L   Sodium 138 135 - 145 mmol/L   Potassium 4.1 3.5 - 5.1 mmol/L   Calcium, Ion 1.23 1.15 - 1.40 mmol/L   HCT 34.0 (L) 39.0 - 52.0 %   Hemoglobin 11.6 (L) 13.0 - 17.0 g/dL   Sample type MIXED VENOUS SAMPLE    Comment NOTIFIED PHYSICIAN   I-STAT 7, (LYTES, BLD GAS, ICA, H+H)     Status: Abnormal   Collection Time: 04/20/22  2:17 PM  Result Value Ref Range     pH, Arterial 7.457 (H) 7.35 - 7.45   pCO2 arterial 31.2 (L) 32 - 48 mmHg   pO2, Arterial 71 (L) 83 - 108 mmHg   Bicarbonate 22.1 20.0 - 28.0 mmol/L   TCO2 23 22 - 32 mmol/L   O2 Saturation 95 %   Acid-base deficit 1.0 0.0 - 2.0 mmol/L   Sodium 137 135 - 145 mmol/L   Potassium 4.1 3.5 - 5.1 mmol/L   Calcium, Ion 1.21 1.15 - 1.40 mmol/L   HCT 34.0 (L) 39.0 - 52.0 %   Hemoglobin 11.6 (L) 13.0 - 17.0 g/dL   Sample type ARTERIAL   APTT     Status: Abnormal   Collection Time: 04/21/22  2:01 AM  Result Value Ref Range   aPTT 89 (H) 24 - 36 seconds    Comment:        IF BASELINE aPTT IS ELEVATED, SUGGEST PATIENT RISK ASSESSMENT BE USED TO DETERMINE APPROPRIATE ANTICOAGULANT THERAPY. Performed at [facility], [address] [place], [place] [address]   CBC     Status: Abnormal   Collection Time: 04/21/22  4:28 AM  Result Value Ref Range   WBC 5.4 4.0 - 10.5 K/uL   RBC 3.59 (L) 4.22 - 5.81 MIL/uL   Hemoglobin 12.1 (L) 13.0 - 17.0 g/dL   HCT 35.7 (L) 39.0 - 52.0 %   MCV 99.4 80.0 - 100.0 fL   MCH 33.7 26.0 - 34.0 pg   MCHC 33.9 30.0 - 36.0 g/dL   RDW 13.0  11.5 - 15.5 %   Platelets 181 150 - 400 K/uL   nRBC 0.0 0.0 - 0.2 %    Comment: Performed at [facility], [address] [place], [place] [address]  Heparin level (unfractionated)     Status: None   Collection Time: 04/21/22 10:04 AM  Result Value Ref Range   Heparin Unfractionated 0.61 0.30 - 0.70 IU/mL    Comment: (NOTE) The clinical reportable range upper limit is being lowered to >1.10 to align with the FDA approved guidance for the current laboratory assay.  If heparin results are below expected values, and patient dosage has  been confirmed, suggest follow up testing of antithrombin III levels. Performed at [facility], [address] [place], [place] [address]   APTT     Status: Abnormal   Collection Time: 04/21/22 10:04 AM  Result Value Ref Range   aPTT 82 (H) 24 - 36 seconds    Comment:        IF BASELINE aPTT IS ELEVATED, SUGGEST PATIENT RISK ASSESSMENT BE USED TO DETERMINE APPROPRIATE ANTICOAGULANT THERAPY. Performed at [facility], [address] [place], [place] [address]   Digoxin level     Status: Abnormal   Collection Time: 04/22/22  6:39 AM  Result Value Ref Range   Digoxin Level 0.2 (L) 0.8 - 2.0 ng/mL    Comment: Performed at  [facility]ospital [facility], [address] [place], [place] [address]  CBC     Status: Abnormal   Collection Time: 04/22/22  6:39 AM  Result Value Ref Range   WBC 5.7 4.0 - 10.5 K/uL   RBC 3.60 (L) 4.22 - 5.81 MIL/uL   Hemoglobin 12.1 (L) 13.0 - 17.0 g/dL   HCT 35.9 (L) 39.0 - 52.0 %   MCV 99.7 80.0 - 100.0 fL   MCH 33.6 26.0 - 34.0 pg   MCHC 33.7 30.0 - 36.0 g/dL   RDW 12.8 11.5 - 15.5 %     Platelets 189 150 - 400 K/uL   nRBC 0.0 0.0 - 0.2 %    Comment: Performed at [facility], [address] [place], [place]  [address]  Basic metabolic panel     Status: Abnormal   Collection Time: 04/22/22  6:39 AM  Result Value Ref Range   Sodium 137 135 - 145 mmol/L   Potassium 3.8 3.5 - 5.1  mmol/L   Chloride 104 98 - 111 mmol/L   CO2 24 22 - 32 mmol/L   Glucose, Bld 87 70 - 99 mg/dL    Comment: Glucose reference range applies only to samples taken after fasting for at least 8 hours.   BUN 23 8 - 23 mg/dL   Creatinine, Ser 0.81 0.61 - 1.24 mg/dL   Calcium 8.5 (L) 8.9 - 10.3 mg/dL   GFR, Estimated >60 >60 mL/min    Comment: (NOTE) Calculated using the CKD-EPI Creatinine Equation (2021)    Anion gap 9 5 - 15    Comment: Performed at [facility], [address] [place], [place] [address]  Basic metabolic panel     Status: Abnormal   Collection Time: 04/23/22  6:13 AM  Result Value Ref Range   Sodium 138 135 - 145 mmol/L   Potassium 3.9 3.5 - 5.1 mmol/L   Chloride 105 98 - 111 mmol/L   CO2 27 22 - 32 mmol/L   Glucose, Bld 89 70 - 99 mg/dL    Comment: Glucose reference range applies only to samples taken after fasting for at least 8 hours.   BUN 20 8 - 23 mg/dL   Creatinine, Ser 0.94 0.61 - 1.24 mg/dL   Calcium 8.6 (L) 8.9 - 10.3 mg/dL   GFR, Estimated >60 >60 mL/min    Comment: (NOTE) Calculated using the CKD-EPI Creatinine Equation (2021)    Anion gap 6 5 - 15    Comment: Performed at [facility], [address] [place], [place] [address]  Basic metabolic panel     Status: Abnormal   Collection Time: 04/24/22  6:24 AM  Result Value Ref Range   Sodium 138 135 - 145 mmol/L   Potassium 4.2 3.5 - 5.1 mmol/L   Chloride 106 98 - 111 mmol/L   CO2 27 22 - 32 mmol/L   Glucose, Bld 94 70 - 99 mg/dL    Comment: Glucose reference range applies only to samples taken after fasting for at least 8 hours.   BUN 22 8 - 23 mg/dL   Creatinine, Ser 1.02 0.61 - 1.24 mg/dL   Calcium 8.7 (L) 8.9 - 10.3 mg/dL   GFR, Estimated >60 >60 mL/min    Comment: (NOTE) Calculated using the CKD-EPI Creatinine Equation (2021)    Anion gap 5 5 - 15    Comment: Performed at [facility], [address] , [place] [address]  Digoxin level     Status:  Abnormal   Collection Time: 04/24/22  6:24 AM  Result Value Ref Range   Digoxin Level 0.5 (L) 0.8 - 2.0 ng/mL    Comment: Performed at [facility], [address] ,  [address]  Basic metabolic panel     Status: Abnormal   Collection Time: 04/25/22  5:35 AM  Result Value Ref Range   Sodium 137 135 - 145 mmol/L   Potassium 3.8 3.5 - 5.1 mmol/L   Chloride 107 98 - 111 mmol/L   CO2 24 22 - 32 mmol/L   Glucose, Bld 141 (H)   70 - 99 mg/dL    Comment: Glucose reference range applies only to samples taken after fasting for at least 8 hours.   BUN 26 (H) 8 - 23 mg/dL   Creatinine, Ser 0.98 0.61 - 1.24 mg/dL   Calcium 8.5 (L) 8.9 - 10.3 mg/dL   GFR, Estimated >60 >60 mL/min    Comment: (NOTE) Calculated using the CKD-EPI Creatinine Equation (2021)    Anion gap 6 5 - 15    Comment: Performed at [facility], [address] [place], [place] [address]    Radiology CARDIAC CATHETERIZATION  Result Date: 04/20/2022   RPDA lesion is 100% stenosed.   1st Diag lesion is 40% stenosed.   Mid LAD to Dist LAD lesion is 30% stenosed. 1.  Heavily calcified coronary arteries with mild to moderate nonobstructive coronary artery disease with the exception of an occluded proximal right PDA with left-to-right collaterals. 2.  Left ventricular angiography was not performed.  EF was moderately to severely reduced by echo. 3.  Right heart catheterization showed mildly elevated left-sided filling pressures, mild pulmonary hypertension and normal cardiac output. 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 His coronary arteries are heavily calcified but with no obstructive disease and does not require revascularization.  Recommend aggressive medical therapy.\ I increased the dose of Toprol for better rate control of atrial fibrillation. Heparin can be resumed at 6 PM and can be transition back to Xarelto if no plans for AAA repair during this admission.  The patient is considered at moderate risk from a cardiac standpoint.   ECHOCARDIOGRAM COMPLETE  Result Date: 04/19/2022    ECHOCARDIOGRAM REPORT   Patient Name:   Gary Dawson Date of Exam: 04/19/2022 Medical Rec #:  [id number]              Height:       73.0 in Accession #:    [id number]             Weight:       194.0 lb Date of Birth:  09/11/1940              BSA:          2.124 m Patient Age:    81 years               BP:           132/87 mmHg Patient Gender: M                      HR:           88 bpm. Exam Location:  [facility] Procedure: 2D Echo, Cardiac Doppler and Color Doppler Indications:     Atrial Fibrillation I48.91  History:         Patient has prior history of Echocardiogram examinations, most                  recent 05/19/2020. Mitral Valve Prolapse; Risk                  Factors:Hypertension. Persistent Afib.  Sonographer:     Jerry Hege Referring Phys:  [id number] AMY N COX Diagnosing Phys: Brian Agbor-Etang MD IMPRESSIONS  1. Left ventricular ejection fraction, by estimation, is 30 to 35%. The left ventricle has moderate to severely decreased function. The left ventricle demonstrates global hypokinesis. There is mild left ventricular hypertrophy. Left ventricular diastolic parameters are indeterminate.  2. Right ventricular systolic function is mildly reduced. The   right ventricular size is moderately enlarged. There is mildly elevated pulmonary artery systolic pressure.  3. Right atrial size was severely dilated.  4. The mitral valve is normal in structure. Mild mitral valve regurgitation.  5. Tricuspid valve regurgitation is moderate.  6. The aortic valve is tricuspid. Aortic valve regurgitation is mild. Aortic valve sclerosis/calcification is present, without any evidence of aortic stenosis.  7. Aortic dilatation noted. There is mild dilatation of the aortic root, measuring 40 mm.  8. The inferior vena cava is normal in size with <50% respiratory variability, suggesting right atrial pressure of  8 mmHg. FINDINGS  Left Ventricle: Left ventricular ejection fraction, by estimation, is 30 to 35%. The left ventricle has moderate to severely decreased function. The left ventricle demonstrates global hypokinesis. The left ventricular internal cavity size was normal in size. There is mild left ventricular hypertrophy. Left ventricular diastolic parameters are indeterminate. Right Ventricle: The right ventricular size is moderately enlarged. No increase in right ventricular wall thickness. Right ventricular systolic function is mildly reduced. There is mildly elevated pulmonary artery systolic pressure. The tricuspid regurgitant velocity is 2.84 m/s, and with an assumed right atrial pressure of 8 mmHg, the estimated right ventricular systolic pressure is 40.3 mmHg. Left Atrium: Left atrial size was normal in size. Right Atrium: Right atrial size was severely dilated. Pericardium: There is no evidence of pericardial effusion. Mitral Valve: The mitral valve is normal in structure. Mild mitral valve regurgitation. Tricuspid Valve: The tricuspid valve is normal in structure. Tricuspid valve regurgitation is moderate. Aortic Valve: The aortic valve is tricuspid. Aortic valve regurgitation is mild. Aortic regurgitation PHT measures 595 msec. Aortic valve sclerosis/calcification is present, without any evidence of aortic stenosis. Aortic valve mean gradient measures 2.0  mmHg. Aortic valve peak gradient measures 3.1 mmHg. Aortic valve area, by VTI measures 3.35 cm. Pulmonic Valve: The pulmonic valve was normal in structure. Pulmonic valve regurgitation is mild. Aorta: Aortic dilatation noted. There is mild dilatation of the aortic root, measuring 40 mm. Venous: The inferior vena cava is normal in size with less than 50% respiratory variability, suggesting right atrial pressure of 8 mmHg. IAS/Shunts: No atrial level shunt detected by color flow Doppler.  LEFT VENTRICLE PLAX 2D LVIDd:         5.30 cm LVIDs:         4.90 cm  LV PW:         1.10 cm LV IVS:        1.20 cm LVOT diam:     2.30 cm LV SV:         52 LV SV Index:   24 LVOT Area:     4.15 cm  LV Volumes (MOD) LV vol d, MOD A2C: 99.4 ml LV vol d, MOD A4C: 109.2 ml LV vol s, MOD A2C: 55.0 ml LV vol s, MOD A4C: 72.2 ml LV SV MOD A2C:     44.4 ml LV SV MOD A4C:     109.2 ml LV SV MOD BP:      43.1 ml RIGHT VENTRICLE RV Basal diam:  5.60 cm RV Mid diam:    3.20 cm RV S prime:     7.83 cm/s TAPSE (M-mode): 1.1 cm LEFT ATRIUM             Index        RIGHT ATRIUM           Index LA diam:        4.50 cm 2.12 cm/m     RA Area:     36.40 cm LA Vol (A2C):   45.3 ml 21.33 ml/m  RA Volume:   151.00 ml 71.09 ml/m LA Vol (A4C):   47.5 ml 22.36 ml/m LA Biplane Vol: 50.7 ml 23.87 ml/m  AORTIC VALVE AV Area (Vmax):    3.35 cm AV Area (Vmean):   3.27 cm AV Area (VTI):     3.35 cm AV Vmax:           88.15 cm/s AV Vmean:          59.250 cm/s AV VTI:            0.155 m AV Peak Grad:      3.1 mmHg AV Mean Grad:      2.0 mmHg LVOT Vmax:         71.10 cm/s LVOT Vmean:        46.600 cm/s LVOT VTI:          0.125 m LVOT/AV VTI ratio: 0.81 AI PHT:            595 msec  AORTA Ao Root diam: 4.07 cm MITRAL VALVE               TRICUSPID VALVE MV Area (PHT): 6.43 cm    TR Peak grad:   32.3 mmHg MV Decel Time: 118 msec    TR Vmax:        284.00 cm/s MV E velocity: 76.50 cm/s                            SHUNTS                            Systemic VTI:  0.12 m                            Systemic Diam: 2.30 cm Brian Agbor-Etang MD Electronically signed by Brian Agbor-Etang MD Signature Date/Time: 04/19/2022/1:45:20 PM    Final    DG Chest Port 1 View  Result Date: 04/18/2022 CLINICAL DATA:  Confusion, weakness EXAM: PORTABLE CHEST 1 VIEW COMPARISON:  09/29/2019, 04/18/2022 FINDINGS: Single frontal view of the chest demonstrates stable enlargement of the cardiac silhouette. No acute airspace disease, effusion, or pneumothorax. No acute bony abnormality. IMPRESSION: 1. Stable enlarged cardiac silhouette. 2.  No acute intrathoracic process. Electronically Signed   By: Michael  Brown M.D.   On: 04/18/2022 18:24   CT ABDOMEN PELVIS W CONTRAST  Result Date: 04/18/2022 CLINICAL DATA:  Acute abdominal pain and low back pain after fall. EXAM: CT ABDOMEN AND PELVIS WITH CONTRAST TECHNIQUE: Multidetector CT imaging of the abdomen and pelvis was performed using the standard protocol following bolus administration of intravenous contrast. RADIATION DOSE REDUCTION: This exam was performed according to the departmental dose-optimization program which includes automated exposure control, adjustment of the mA and/or kV according to patient size and/or use of iterative reconstruction technique. CONTRAST:  100mL OMNIPAQUE IOHEXOL 300 MG/ML  SOLN COMPARISON:  12/06/2009 FINDINGS: Despite efforts by the technologist and patient, motion artifact is present on today's exam and could not be eliminated. This reduces exam sensitivity and specificity. Lower chest: Moderate cardiomegaly with atherosclerotic calcification of the left anterior descending, right, and circumflex coronary arteries as well as the descending thoracic aorta. 5 by 4 mm right middle lobe pulmonary nodule on image 1 series 4, not included on the   prior CT of 12/06/2009. No follow-up imaging of this nodule is recommended. JACR 2018 Feb; 264-273, Management of the Incidental Renal Mass on CT, [employer] 2021; 814-848, Bosniak Classification of Cystic Renal Masses, Version 2019. Per Fleischner Society Guidelines, no routine follow-up imaging is recommended. These guidelines do not apply to immunocompromised patients and patients with cancer. Follow up in patients with significant comorbidities as clinically warranted. For lung cancer screening, adhere to Lung-RADS guidelines. Reference: Radiology. 2017; 284(1):228-43. Hepatobiliary: Unremarkable Pancreas: Unremarkable Spleen: Unremarkable Adrenals/Urinary Tract: Both adrenal glands appear normal. 2.3 by 2.0 cm exophytic  hypodense lesion of the right kidney upper pole with internal density of 14 Hounsfield units and relatively homogeneous density characteristics. No follow-up imaging of this cyst is recommended. JACR 2018 Feb; 264-273, Management of the Incidental Renal Mass on CT, [employer] 2021; 814-848, Bosniak Classification of Cystic Renal Masses, Version 2019. Additional small hypodense renal cysts in both kidneys noted. No further imaging workup of these lesions is indicated. Stomach/Bowel: Borderline prominence of gas and stool in the colon, query mild constipation. 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 The saccular portion of the aneurysm measures 4.6 by 2.9 by 4.0 cm, with a margin of thrombosis and a centrally enhancing component. If we include the abdominal aorta itself in the overall size determination, this measures 5.1 cm in maximum dimension. I do not see evidence of acute or recent retroperitoneal hemorrhage or current aortoduodenal fistula, although the duodenum abuts this saccular aneurysm. Vascular surgical consultation is recommended. Reference: J Vasc Surg 2018;67:2-77. 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 Mild residual asymmetry with the right peripheral prostate gland or protuberant than the left. Other: No supplemental non-categorized findings. Musculoskeletal: Please see dedicated spine CT reports. Moderate degenerative hip arthropathy bilaterally. IMPRESSION: 1. 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 Vascular surgical consultation is recommended. 2. Borderline prominence of gas and stool in the colon, query mild constipation. 3. Moderate cardiomegaly with  atherosclerotic calcification of the coronary arteries. 4. Moderate degenerative hip arthropathy bilaterally. 5. 5 by 4 mm right middle lobe pulmonary nodule. No follow-up imaging of this nodule is recommended. 6. Aortic and coronary atherosclerosis. 7. 2.3 by 2.0 cm exophytic hypodense lesion of the right kidney upper pole with internal density of 14 Hounsfield units and relatively homogeneous density characteristics. No follow up of this lesion is recommended. Aortic Atherosclerosis (ICD10-I70.0). Electronically Signed   By: Walter  Liebkemann M.D.   On: 04/18/2022 17:12   CT L-SPINE NO CHARGE  Result Date: 04/18/2022 CLINICAL DATA:  Fall. EXAM: CT LUMBAR SPINE WITHOUT CONTRAST TECHNIQUE: Multidetector CT imaging of the lumbar spine was performed without intravenous contrast administration. Multiplanar CT image reconstructions were also generated. RADIATION DOSE REDUCTION: This exam was performed according to the departmental dose-optimization program which includes automated exposure control, adjustment of the mA and/or kV according to patient size and/or use of iterative reconstruction technique. COMPARISON:  CT abdomen and pelvis 12/06/2009 FINDINGS: Segmentation: There is transitional lumbosacral anatomy. Designating the lowest ribs is T12, the transitional segment is a partially sacralized L5. Alignment: Mild lumbar levoscoliosis. Trace anterolisthesis of L3 on L4. Vertebrae: Mild T11 and L1 superior endplate compression fractures, chronic in appearance. Scattered Schmorl's nodes. No definite acute fracture. No destructive osseous lesion. Solid   bridging anterior vertebral osteophytes at T8-9 and T9-10. Paraspinal and other soft tissues: No acute abnormality in the paraspinal soft tissues. Remainder of the abdomen and pelvis reported separately. Disc levels: Disc bulging and posterior element hypertrophy result in mild spinal stenosis at L2-3 and likely moderate spinal stenosis at L3-4 and L4-5.  Mild-to-moderate multilevel neural foraminal stenosis. IMPRESSION: 1. No acute osseous abnormality identified in the lumbar spine. 2. 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 Electronically Signed   By: Allen  Grady M.D.   On: 04/18/2022 17:11   CT HEAD WO CONTRAST (5MM)  Result Date: 04/18/2022 CLINICAL DATA:  Head trauma, moderate-severe; Neck trauma (Age >= 65y). EXAM: CT HEAD WITHOUT CONTRAST CT CERVICAL SPINE WITHOUT CONTRAST TECHNIQUE: Multidetector CT imaging of the head and cervical spine was performed following the standard protocol without intravenous contrast. Multiplanar CT image reconstructions of the cervical spine were also generated. RADIATION DOSE REDUCTION: This exam was performed according to the departmental dose-optimization program which includes automated exposure control, adjustment of the mA and/or kV according to patient size and/or use of iterative reconstruction technique. COMPARISON:  CT head and cervical spine 03/19/2020. FINDINGS: CT HEAD FINDINGS Brain: There is no evidence of an acute infarct, intracranial hemorrhage, mass, midline shift, or extra-axial fluid collection. Moderate ventriculomegaly is unchanged and may reflect central predominant cerebral atrophy or chronic communicating hydrocephalus. Cerebral white matter hypodensities are unchanged and nonspecific but compatible with mild chronic small vessel ischemic disease. 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 Vascular: Calcified atherosclerosis at the skull base. No hyperdense vessel. Skull: No acute fracture or suspicious osseous lesion. Sinuses/Orbits: Tiny mucous retention cyst in the left sphenoid sinus. Clear mastoid air cells. Bilateral cataract extraction. Other: None. CT CERVICAL SPINE FINDINGS Alignment: Unchanged trace retrolisthesis of C3 on C4. Skull base and vertebrae: Unchanged mild chronic C7 superior endplate compression fracture. No acute  fracture or suspicious osseous lesion. Soft tissues and spinal canal: No prevertebral fluid or swelling. No visible canal hematoma. Disc levels: Similar appearance of multilevel disc degeneration, with moderate disc space narrowing noted from C3-4 through C5-6. Disc bulging and uncovertebral spurring at these levels result in mild spinal stenosis an asymmetrically advanced right-sided neural foraminal stenosis. Upper chest: No mass or consolidation in the included lung apices. Other: None. IMPRESSION: 1. No evidence of acute intracranial abnormality. 2. Mild chronic small vessel ischemic disease and unchanged chronic ventriculomegaly. 3. No acute cervical spine fracture. Electronically Signed   By: Allen  Grady M.D.   On: 04/18/2022 17:02   CT Cervical Spine Wo Contrast  Result Date: 04/18/2022 CLINICAL DATA:  Head trauma, moderate-severe; Neck trauma (Age >= 65y). EXAM: CT HEAD WITHOUT CONTRAST CT CERVICAL SPINE WITHOUT CONTRAST TECHNIQUE: Multidetector CT imaging of the head and cervical spine was performed following the standard protocol without intravenous contrast. Multiplanar CT image reconstructions of the cervical spine were also generated. RADIATION DOSE REDUCTION: This exam was performed according to the departmental dose-optimization program which includes automated exposure control, adjustment of the mA and/or kV according to patient size and/or use of iterative reconstruction technique. COMPARISON:  CT head and cervical spine 03/19/2020. FINDINGS: CT HEAD FINDINGS Brain: There is no evidence of an acute infarct, intracranial hemorrhage, mass, midline shift, or extra-axial fluid collection. Moderate ventriculomegaly is unchanged and may reflect central predominant cerebral atrophy or chronic communicating hydrocephalus. Cerebral white matter hypodensities are unchanged and nonspecific but compatible with mild chronic small vessel ischemic disease. Small chronic bilateral cerebellar infarcts   are again  noted. Vascular: Calcified atherosclerosis at the skull base. No hyperdense vessel. Skull: No acute fracture or suspicious osseous lesion. Sinuses/Orbits: Tiny mucous retention cyst in the left sphenoid sinus. Clear mastoid air cells. Bilateral cataract extraction. Other: None. CT CERVICAL SPINE FINDINGS Alignment: Unchanged trace retrolisthesis of C3 on C4. Skull base and vertebrae: Unchanged mild chronic C7 superior endplate compression fracture. No acute fracture or suspicious osseous lesion. Soft tissues and spinal canal: No prevertebral fluid or swelling. No visible canal hematoma. Disc levels: Similar appearance of multilevel disc degeneration, with moderate disc space narrowing noted from C3-4 through C5-6. Disc bulging and uncovertebral spurring at these levels result in mild spinal stenosis an asymmetrically advanced right-sided neural foraminal stenosis. Upper chest: No mass or consolidation in the included lung apices. Other: None. IMPRESSION: 1. No evidence of acute intracranial abnormality. 2. Mild chronic small vessel ischemic disease and unchanged chronic ventriculomegaly. 3. No acute cervical spine fracture. Electronically Signed   By: Allen  Grady M.D.   On: 04/18/2022 17:02    Assessment/Plan  Benign essential hypertension blood pressure control important in reducing the progression of atherosclerotic disease. On appropriate oral medications.   Hyperlipidemia lipid control important in reducing the progression of atherosclerotic disease. Continue statin therapy   Abdominal aortic aneurysm (AAA) without rupture I have rereviewed his CT scan of his abdomen and pelvis and shown this to he and his family today.  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  The mid and proximal infrarenal aorta and the iliac arteries are calcific but not aneurysmal or particularly stenotic.  I had a long discussion with the patient and his  daughters today.  This is definitely a very concerning aneurysm given the large size of a saccular aneurysm.  This is at high risk of lethal rupture.  This would be a relatively straightforward repair with a stent graft and this is what I would recommend for him.  He has been evaluated by cardiology and is following up with them and we would appreciate their input for cardiac optimization prior to his surgery.  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    Alexandrina Fiorini, MD  05/05/2022 2:47 PM    This note was created with Dragon medical transcription system.  Any errors from dictation are purely unintentional 

## 2022-05-05 NOTE — Patient Instructions (Signed)
Abdominal Aortic Aneurysm Endograft Repair, Care After After abdominal aortic aneurysm endograft repair it is common to have pain or soreness at the incision site. You may also have tiredness (fatigue). Follow these instructions at home: Medicines Take over-the-counter and prescription medicines only as told by your health care provider. If you were prescribed antibiotics, take them as told by your provider. Do not stop using the antibiotic even if you start to feel better. Incision care  Follow instructions from your provider about how to take care of your incisions. Make sure you: Wash your hands with soap and water for at least 20 seconds before and after you change your bandage (dressing). If soap and water are not available, use hand sanitizer. Change your dressing as told by your health care provider. Leave stitches (sutures), skin glue, or tape strips in place. These skin closures may need to stay in place for 2 weeks or longer. If tape strip edges start to loosen and curl up, you may trim the loose edges. Do not remove tape strips completely unless your provider tells you to do that. Check your incision area every day for signs of infection. Check for: Redness, swelling, or more pain. Fluid or blood. Warmth. Pus or a bad smell. Keep the incision area clean and dry. Check your incisions for a lump or swelling. These can be signs of bleeding at your incision site. Activity Rest as told by your provider. Do not sit for a long time without moving. Get up to take short walks every 1-2 hours. This will improve blood flow and breathing. Ask for help if you feel weak or unsteady. You may have to avoid lifting. Ask your provider how much you can safely lift. Do not drive or operate machinery until your provider says that it is safe. Return to your normal activities as told by your provider. Ask your provider what activities are safe for you. Lifestyle  Do not use any products that contain  nicotine or tobacco. These products include cigarettes, chewing tobacco, and vaping devices, such as e-cigarettes. These can delay incision healing after surgery. If you need help quitting, ask your provider. Make any lifestyle changes that your provider suggests. You may need to: Keep your blood pressure under control. Find ways to lower stress. Eat healthy foods that are good for your heart. These include vegetables, fruits, and whole grains that add fiber to your diet. Get regular exercise once your provider tells you it is safe to do so. General instructions Do not take baths, swim, or use a hot tub until your provider approves. Ask your provider if you may take showers. You may only be allowed to take sponge baths. Drink enough fluid to keep your pee (urine) pale yellow. Wear compression stockings as told by your provider. These stockings help to prevent blood clots and reduce swelling in your legs. Keep all follow-up visits. Your provider will need to monitor your healing and make sure the graft is working like it should. Your health care provider may give you more instructions. Make sure you know what you can and cannot do. Contact a health care provider if: You have pain in your abdomen, chest, legs, or back. You have any signs of infection. You have a fever. Get help right away if: You have trouble breathing. You have sudden pain in your legs, or you have trouble moving either of your legs. You feel light-headed, or you faint. These symptoms may be an emergency. Get help right away. Call   911. Do not wait to see if the symptoms will go away. Do not drive yourself to the hospital. This information is not intended to replace advice given to you by your health care provider. Make sure you discuss any questions you have with your health care provider. Document Revised: 08/09/2021 Document Reviewed: 08/09/2021 Elsevier Patient Education  2023 [employer] Inc.  

## 2022-05-05 NOTE — Patient Instructions (Addendum)
Medication Instructions:  Your physician recommends that you continue on your current medications as directed. Please refer to the Current Medication list given to you today.  *If you need a refill on your cardiac medications before your next appointment, please call your pharmacy*   Lab Work: Your physician recommends that you return for lab work in 1 week: Digoxin level & BMET  [facility] Mall Entrance at [facility] 1st desk on the right to check in (REGISTRATION)  Lab hours: Monday- Friday (7:30 am- 5:30 pm)   If you have labs (blood work) drawn today and your tests are completely normal, you will receive your results only by: MyChart Message (if you have MyChart) OR A paper copy in the mail If you have any lab test that is abnormal or we need to change your treatment, we will call you to review the results.   Testing/Procedures: -None ordered  Follow-Up: At [facility], you and your health needs are our priority.  As part of our continuing mission to provide you with exceptional heart care, we have created designated Provider Care Teams.  These Care Teams include your primary Cardiologist (physician) and Advanced Practice Providers (APPs -  Physician Assistants and Nurse Practitioners) who all work together to provide you with the care you need, when you need it.  We recommend signing up for the patient portal called "MyChart".  Sign up information is provided on this After Visit Summary.  MyChart is used to connect with patients for Virtual Visits (Telemedicine).  Patients are able to view lab/test results, encounter notes, upcoming appointments, etc.  Non-urgent messages can be sent to your provider as well.   To learn more about what you can do with MyChart, go to [email or web address].    Your next appointment:   2 week(s)  Provider:   You may see Julien Nordmann, MD or one of the following Advanced Practice Providers on your designated Care Team:   Nicolasa Ducking,  NP Eula Listen, PA-C Cadence Fransico Michael, PA-C Charlsie Quest, NP    Other Instructions AMB referral to cardiac rehabilitation

## 2022-05-05 NOTE — Assessment & Plan Note (Signed)
lipid control important in reducing the progression of atherosclerotic disease. Continue statin therapy  

## 2022-05-05 NOTE — Assessment & Plan Note (Signed)
I have rereviewed his CT scan of his abdomen and pelvis and shown this to he and his family today.  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  The mid and proximal infrarenal aorta and the iliac arteries are calcific but not aneurysmal or particularly stenotic.  I had a long discussion with the patient and his daughters today.  This is definitely a very concerning aneurysm given the large size of a saccular aneurysm.  This is at high risk of lethal rupture.  This would be a relatively straightforward repair with a stent graft and this is what I would recommend for him.  He has been evaluated by cardiology and is following up with them and we would appreciate their input for cardiac optimization prior to his surgery.  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

## 2022-05-08 ENCOUNTER — Telehealth: Payer: Self-pay | Admitting: Cardiovascular Disease

## 2022-05-08 ENCOUNTER — Other Ambulatory Visit: Payer: Self-pay

## 2022-05-08 MED ORDER — DAPAGLIFLOZIN PROPANEDIOL 10 MG PO TABS: 10.0000 mg | ORAL_TABLET | Freq: Every day | ORAL | 1 refills | Status: DC

## 2022-05-08 MED ORDER — DIGOXIN 250 MCG PO TABS: 0.2500 mg | ORAL_TABLET | Freq: Every day | ORAL | 1 refills | Status: DC

## 2022-05-08 MED ORDER — ATORVASTATIN CALCIUM 40 MG PO TABS: 40.0000 mg | ORAL_TABLET | Freq: Every day | ORAL | 1 refills | Status: DC

## 2022-05-08 NOTE — Telephone Encounter (Signed)
*  STAT* If patient is at the pharmacy, call can be transferred to refill team.   1. Which medications need to be refilled? (please list name of each medication and dose if known) Lipitor, Farxiaga, Lanoxin   2. Which pharmacy/location (including street and city if local pharmacy) is medication to be sent to?[facility]   3. Do they need a 30 day or 90 day supply? 90  Pt stated that pharmacy did not get these 3.  The other 2 was sent in on 4/19 but they did not rec these 3.    Best number [phone]

## 2022-05-09 ENCOUNTER — Telehealth (INDEPENDENT_AMBULATORY_CARE_PROVIDER_SITE_OTHER): Payer: Self-pay

## 2022-05-09 ENCOUNTER — Encounter (INDEPENDENT_AMBULATORY_CARE_PROVIDER_SITE_OTHER): Payer: Self-pay

## 2022-05-09 MED ORDER — DAPAGLIFLOZIN PROPANEDIOL 10 MG PO TABS: 10.0000 mg | ORAL_TABLET | Freq: Every day | ORAL | 0 refills | Status: DC

## 2022-05-09 MED ORDER — ATORVASTATIN CALCIUM 40 MG PO TABS: 40.0000 mg | ORAL_TABLET | Freq: Every day | ORAL | 0 refills | Status: DC

## 2022-05-09 MED ORDER — DIGOXIN 250 MCG PO TABS: 0.2500 mg | ORAL_TABLET | Freq: Every day | ORAL | 0 refills | Status: DC

## 2022-05-09 NOTE — Addendum Note (Signed)
Addended by: Thayer Headings, Felise Georgia L on: 05/09/2022 01:08 PM   Modules accepted: Orders

## 2022-05-09 NOTE — Telephone Encounter (Signed)
Spoke with patient and informed him that refills were sent to mail order pharmacy as requested. He also requested to have a 30 day supply sent to [facility] in [place] because he will run out before the mail order is delivered.  Requested Prescriptions   Signed Prescriptions Disp Refills   digoxin (LANOXIN) 0.25 MG tablet 30 tablet 0    Sig: Take 1 tablet (0.25 mg total) by mouth daily.    Authorizing Provider: Marianne Sofia    Ordering User: Thayer Headings, Abigail Teall L   atorvastatin (LIPITOR) 40 MG tablet 30 tablet 0    Sig: Take 1 tablet (40 mg total) by mouth daily.    Authorizing Provider: Marianne Sofia    Ordering User: Thayer Headings, Yazleen Molock L   dapagliflozin propanediol (FARXIGA) 10 MG TABS tablet 30 tablet 0    Sig: Take 1 tablet (10 mg total) by mouth daily.    Authorizing Provider: Marianne Sofia    Ordering User: Thayer Headings, Noela Brothers L

## 2022-05-09 NOTE — Telephone Encounter (Signed)
Gary Dawson is following up on behalf of the patient. She states patient is completely out of medication. She also requested a call back to confirm when refill has been sent in.

## 2022-05-09 NOTE — Telephone Encounter (Signed)
Requested Prescriptions   Signed Prescriptions Disp Refills   atorvastatin (LIPITOR) 40 MG tablet 90 tablet 0    Sig: Take 1 tablet (40 mg total) by mouth daily.    Authorizing Provider: Marianne Sofia    Ordering User: Thayer Headings, Cris Gibby L   dapagliflozin propanediol (FARXIGA) 10 MG TABS tablet 90 tablet 0    Sig: Take 1 tablet (10 mg total) by mouth daily.    Authorizing Provider: Marianne Sofia    Ordering User: Thayer Headings, Elba Schaber L   digoxin (LANOXIN) 0.25 MG tablet 90 tablet 0    Sig: Take 1 tablet (0.25 mg total) by mouth daily.    Authorizing Provider: Marianne Sofia    Ordering User: Thayer Headings, Destiney Sanabia L

## 2022-05-09 NOTE — Telephone Encounter (Addendum)
Patient has been schedule for AAA stent graft repair with Dr Wyn Quaker assisted by Dr Gilda Crease on 05/18/2022 [facility]. Patient pre-op is 05/12/2022 at [facility]pm [facility]. Anesthesia has been schedule with procedure. I left a detail message on patient friend Scientific laboratory technician. Pre-procedures instructions will be mailed.

## 2022-05-10 ENCOUNTER — Other Ambulatory Visit
Admission: RE | Admit: 2022-05-10 | Discharge: 2022-05-10 | Disposition: A | Discharge status: Home / Self Care | Payer: Medicare HMO | Attending: Medical | Admitting: Medical

## 2022-05-10 ENCOUNTER — Other Ambulatory Visit: Payer: Self-pay | Admitting: Family Medicine

## 2022-05-10 DIAGNOSIS — Z79899 Other long term (current) drug therapy: Secondary | ICD-10-CM | POA: Diagnosis not present

## 2022-05-10 DIAGNOSIS — I4891 Unspecified atrial fibrillation: Secondary | ICD-10-CM

## 2022-05-10 DIAGNOSIS — Z0189 Encounter for other specified special examinations: Secondary | ICD-10-CM | POA: Diagnosis not present

## 2022-05-10 DIAGNOSIS — I4821 Permanent atrial fibrillation: Secondary | ICD-10-CM

## 2022-05-10 DIAGNOSIS — I7143 Infrarenal abdominal aortic aneurysm, without rupture: Secondary | ICD-10-CM

## 2022-05-10 DIAGNOSIS — Z09 Encounter for follow-up examination after completed treatment for conditions other than malignant neoplasm: Secondary | ICD-10-CM | POA: Diagnosis not present

## 2022-05-10 LAB — BASIC METABOLIC PANEL
Anion gap: 8 (ref 5–15)
BUN: 22 mg/dL (ref 8–23)
CO2: 25 mmol/L (ref 22–32)
Calcium: 8.9 mg/dL (ref 8.9–10.3)
Chloride: 103 mmol/L (ref 98–111)
Creatinine, Ser: 0.81 mg/dL (ref 0.61–1.24)
GFR, Estimated: >60 mL/min (ref >60)
Glucose, Bld: 97 mg/dL (ref 70–99)
Potassium: 4.3 mmol/L (ref 3.5–5.1)
Sodium: 136 mmol/L (ref 135–145)

## 2022-05-10 LAB — DIGOXIN LEVEL: Digoxin Level: 0.3 ng/mL — ABNORMAL LOW (ref 0.8–2.0)

## 2022-05-10 NOTE — Telephone Encounter (Signed)
Pre-procedure instructions and pre-op appointment information were gone over with patient friend Jodie.

## 2022-05-12 ENCOUNTER — Encounter
Admission: RE | Admit: 2022-05-12 | Discharge: 2022-05-12 | Disposition: A | Discharge status: Home / Self Care | Payer: Medicare HMO | Source: Ambulatory Visit | Attending: Vascular Surgery | Admitting: Vascular Surgery

## 2022-05-12 ENCOUNTER — Other Ambulatory Visit (INDEPENDENT_AMBULATORY_CARE_PROVIDER_SITE_OTHER): Payer: Self-pay | Admitting: Nurse Practitioner

## 2022-05-12 DIAGNOSIS — I7143 Infrarenal abdominal aortic aneurysm, without rupture: Secondary | ICD-10-CM

## 2022-05-12 DIAGNOSIS — Z01812 Encounter for preprocedural laboratory examination: Secondary | ICD-10-CM | POA: Diagnosis not present

## 2022-05-12 HISTORY — DX: Personal history of urinary calculi: Z87.442

## 2022-05-12 HISTORY — DX: Chronic prostatitis: N41.1

## 2022-05-12 HISTORY — DX: Other cardiomyopathies: I42.8

## 2022-05-12 HISTORY — DX: Benign prostatic hyperplasia with lower urinary tract symptoms: N40.1

## 2022-05-12 HISTORY — DX: Long term (current) use of anticoagulants: Z79.01

## 2022-05-12 HISTORY — DX: Calculus in bladder: N21.0

## 2022-05-12 HISTORY — DX: Acute on chronic systolic (congestive) heart failure: I50.23

## 2022-05-12 HISTORY — DX: Benign prostatic hyperplasia with lower urinary tract symptoms: N13.8

## 2022-05-12 HISTORY — DX: Abdominal aortic aneurysm, without rupture, unspecified: I71.40

## 2022-05-12 LAB — TYPE AND SCREEN

## 2022-05-12 NOTE — Patient Instructions (Addendum)
Your procedure is scheduled on:05-18-22 Thursday [facility] @ 7 am  REMEMBER: Instructions that are not followed completely may result in serious medical risk, up to and including death; or upon the discretion of your surgeon and anesthesiologist your surgery may need to be rescheduled.  Do not eat food OR drink any liquids after midnight the night before surgery.  No gum chewing or hard candies.  One week prior to surgery: Stop Anti-inflammatories (NSAIDS) such as Advil, Aleve, Ibuprofen, Motrin, Naproxen, Naprosyn and Aspirin based products such as Excedrin, Goody's Powder, BC Powder.You may however, continue to take Tylenol/Tramadol if needed for pain up until the day of surgery. Stop ANY OVER THE COUNTER supplements/vitamins NOW (05-12-22) until after surgery (Vitamin B12 and Biotin)  TAKE ONLY THESE MEDICATIONS THE MORNING OF SURGERY WITH A SIP OF WATER: -digoxin (LANOXIN)  -metoprolol succinate (TOPROL-XL)   Continue your losartan (COZAAR) up until the day prior to surgery-Do NOT take the morning of your surgery  Stop your rivaroxaban (XARELTO) 2 days prior to surgery-Last dose will be on 05-15-22 Monday  No Alcohol for 24 hours before or after surgery.  No Smoking including e-cigarettes for 24 hours before surgery.  No chewable tobacco products for at least 6 hours before surgery.  No nicotine patches on the day of surgery.  Do not use any "recreational" drugs for at least a week (preferably 2 weeks) before your surgery.  Please be advised that the combination of cocaine and anesthesia may have negative outcomes, up to and including death. If you test positive for cocaine, your surgery will be cancelled.  On the morning of surgery brush your teeth with toothpaste and water, you may rinse your mouth with mouthwash if you wish. Do not swallow any toothpaste or mouthwash.  Use CHG Soap as directed on instruction sheet.  Do not wear jewelry, make-up,  hairpins, clips or nail polish.  Do not wear lotions, powders, or perfumes.   Do not shave body hair from the neck down 48 hours before surgery.  Contact lenses, hearing aids and dentures may not be worn into surgery.  Do not bring valuables to the hospital. [facility] is not responsible for any missing/lost belongings or valuables.   Notify your doctor if there is any change in your medical condition (cold, fever, infection).  Wear comfortable clothing (specific to your surgery type) to the hospital.  After surgery, you can help prevent lung complications by doing breathing exercises.  Take deep breaths and cough every 1-2 hours. Your doctor may order a device called an Incentive Spirometer to help you take deep breaths. When coughing or sneezing, hold a pillow firmly against your incision with both hands. This is called "splinting." Doing this helps protect your incision. It also decreases belly discomfort.  If you are being admitted to the hospital overnight, leave your suitcase in the car. After surgery it may be brought to your room.  In case of increased patient census, it may be necessary for you, the patient, to continue your postoperative care in the Same Day Surgery department.  If you are being discharged the day of surgery, you will not be allowed to drive home. You will need a responsible individual to drive you home and stay with you for 24 hours after surgery.   If you are taking public transportation, you will need to have a responsible individual with you.  Please call the Pre-admissions Testing Dept. at [phone] if you have any  questions about these instructions.  Surgery Visitation Policy:  Patients having surgery or a procedure may have two visitors.  Children under the age of 16 must have an adult with them who is not the patient.  Inpatient Visitation:    Visiting hours are 7 a.m. to 8 p.m. Up to four visitors are allowed at one time in a patient  room. The visitors may rotate out with other people during the day.  One visitor age 72 or older may stay with the patient overnight and must be in the room by 8 p.m.     Preparing for Surgery with CHLORHEXIDINE GLUCONATE (CHG) Soap  Chlorhexidine Gluconate (CHG) Soap  o An antiseptic cleaner that kills germs and bonds with the skin to continue killing germs even after washing  o Used for showering the night before surgery and morning of surgery  Before surgery, you can play an important role by reducing the number of germs on your skin.  CHG (Chlorhexidine gluconate) soap is an antiseptic cleanser which kills germs and bonds with the skin to continue killing germs even after washing.  Please do not use if you have an allergy to CHG or antibacterial soaps. If your skin becomes reddened/irritated stop using the CHG.  1. Shower the NIGHT BEFORE SURGERY and the MORNING OF SURGERY with CHG soap.  2. If you choose to wash your hair, wash your hair first as usual with your normal shampoo.  3. After shampooing, rinse your hair and body thoroughly to remove the shampoo.  4. Use CHG as you would any other liquid soap. You can apply CHG directly to the skin and wash gently with a scrungie or a clean washcloth.  5. Apply the CHG soap to your body only from the neck down. Do not use on open wounds or open sores. Avoid contact with your eyes, ears, mouth, and genitals (private parts). Wash face and genitals (private parts) with your normal soap.  6. Wash thoroughly, paying special attention to the area where your surgery will be performed.  7. Thoroughly rinse your body with warm water.  8. Do not shower/wash with your normal soap after using and rinsing off the CHG soap.  9. Pat yourself dry with a clean towel.  10. Wear clean pajamas to bed the night before surgery.  12. Place clean sheets on your bed the night of your first shower and do not sleep with pets.  13. Shower again with the  CHG soap on the day of surgery prior to arriving at the hospital.  14. Do not apply any deodorants/lotions/powders.  15. Please wear clean clothes to the hospital.

## 2022-05-15 ENCOUNTER — Other Ambulatory Visit (INDEPENDENT_AMBULATORY_CARE_PROVIDER_SITE_OTHER): Payer: Self-pay | Admitting: Nurse Practitioner

## 2022-05-16 ENCOUNTER — Encounter: Payer: Self-pay | Admitting: Vascular Surgery

## 2022-05-16 NOTE — Progress Notes (Addendum)
Perioperative / Anesthesia Services  Pre-Admission Testing Clinical Review / Preoperative Anesthesia Consult  Date: 05/16/22  Patient Demographics:  Name: Gary Dawson DOB:   12/20/1940 MRN:   [id number]  Planned Surgical Procedure(s):    Case: [id number] Date/Time: 05/18/22 0800   Procedure: ENDOVASCULAR REPAIR/STENT GRAFT   Anesthesia type: General   Diagnosis: Abdominal aortic aneurysm (AAA) without rupture, unspecified part (HCC) [I71.40]   Pre-op diagnosis:      AAA   GORE   ANESTHESIA    AAA repair     Dr Wyn Quaker w [facility] to assist   Location: [facility]   Providers: [name], MD     NOTE: Available PAT nursing documentation and vital signs have been reviewed. Clinical nursing staff has updated patient's PMH/PSHx, current medication list, and drug allergies/intolerances to ensure comprehensive history available to assist in medical decision making as it pertains to the aforementioned surgical procedure and anticipated anesthetic course. Extensive review of available clinical information personally performed. [facility] and PSHx updated with any diagnoses/procedures that  may have been inadvertently omitted during his intake with the pre-admission testing department's nursing staff.  Clinical Discussion:  Gary Dawson is a 82 y.o. male who is submitted for pre-surgical anesthesia review and clearance prior to him undergoing the above procedure. Patient has never been a smoker. Pertinent PMH includes: CAD, NICM, HFrEF, atrial fibrillation, CVA, infrarenal AAA, aortic root dilatation, aortic atherosclerosis, atypical angina, HTN, HLD, acid reflux (no daily Tx), OSAH (does not utilize nocturnal PAP therapy), BPH, depression.  Patient is followed by cardiology Gary Milling, MD). He was last seen in the cardiology clinic on 05/05/2022; notes reviewed. At the time of his clinic visit, patient doing well overall from a cardiovascular perspective.  Patient denied any chest pain, shortness of breath, PND, orthopnea, palpitations, significant peripheral edema, weakness, fatigue, vertiginous symptoms, or presyncope/syncope. Patient with a past medical history significant for cardiovascular diagnoses. Documented physical exam was grossly benign, providing no evidence of acute exacerbation and/or decompensation of the patient's known cardiovascular conditions.  Most recent myocardial perfusion imaging study was performed on 08/17/2020 revealing a severely reduced left ventricular systolic function with an EF of 25%.  There were dense coronary artery calcifications and aortic atherosclerosis on attenuation correction CT images.  Moderate in size, moderate in severity, partially reversible defect involving the inferoseptal myocardium was observed.  Radiologist noted that allowing for differences in technique, the observed inferolateral defect similar to prior study performed on 11/06/2014.  Study determined to be abnormal and potentially high risk.  Most recent TTE was performed on 04/19/2022 revealing a moderately to severely reduced left ventricular systolic function with an EF of 30-35%.  There was global hypokinesis.  Mild LVH noted.  Left ventricular diastolic Doppler parameters indeterminate.  Right ventricle moderately enlarged with mildly reduced function.  PASP elevated at 40.3 mmHg.  Right atrium severely dilated.  There was mild mitral and aortic, in addition to moderate tricuspid valve regurgitation.  Aortic valve sclerosis/complication present.  All transvalvular gradients were noted to be normal providing no evidence suggestive of valvular stenosis.  Aortic ectasia noted with dilated root measuring 40 mm.  Diagnostic RIGHT/LEFT heart catheterization was performed on 04/20/2022 revealing multivessel CAD; 100% stenosis of the RPDA, 40% stenosis D1, and 30% stenosis of the mid to distal LAD.  Heavily calcified coronary arteries with mild to moderate  nonobstructive disease, with the exception of an occluded RPDA, which has formed left to right collaterals.  Hemodynamics  mean RA = 8 mmHg, mean PA = 27 mmHg, mean PCWP = 20 mmHg, LVEDP = 13 mmHg, CO = 4.97 L/min, and CI 2.34 L/min/m.  Findings consistent with known nonischemic cardiomyopathy, which was felt to be tachycardia mediated.  CT imaging of the abdomen and pelvis performed on 04/18/2022 revealed a saccular infrarenal aneurysm measuring up to 5.1 cm.  Vascular surgery was consulted.  Patient with an atrial fibrillation diagnosis; CHA2DS2-VASc Score = 5 (age x 2, HFrEF, HTN, vascular disease history). His rate and rhythm are currently being maintained on oral digoxin + metoprolol succinate. He is chronically anticoagulated using rivaroxaban; reported to be compliant with therapy with no evidence or reports of GI bleeding.  Blood pressure well controlled at 122/60 mmHg on currently prescribed ARB (losartan) and beta-blocker (metoprolol succinate) therapies. He is on a atorvastatin for his HLD diagnosis and further ASCVD prevention.  Patient is not diabetic.  Patient is on an SGLT2i (dapagliflozin) for added cardiovascular and renovascular protection.  He does have an OSAH diagnosis, however does not currently utilize nocturnal PAP therapy.  Despite age and multiple medical comorbidities, patient is able to ambulate 1-2 blocks and attend to all of his ADLs/IADLs without experiencing a significant degree of angina/anginal equivalent symptoms. Functional capacity, as defined by DASI, is documented as being >/= 4 METS.  No changes were made to his medication regimen.  Patient to follow-up with outpatient cardiology in 2 weeks or sooner if needed.  Gary Dawson is scheduled for an ENDOVASCULAR REPAIR/STENT GRAFT (AAA REPAIR) on 05/18/2022 with Dr. Festus Barren, MD.  Given patient's past medical history significant for cardiovascular diagnoses, presurgical cardiac clearance was sought by the PAT team.   Per cardiology, "patient is able to walk 1-2 blocks. He can care for him self and do light work around the house. METs >4. He is MODERATE risk for endovascular repair. According to the RCRI he is Class IV risk with 15% 30 day risk of death, MI or cardiac arrest. OK to pursue AAA without further cardiac work-up".  Again, this patient is on daily oral anticoagulation therapy.  He has been instructed on recommendations from both cardiology and vascular surgery for holding his rivaroxaban dose for 2 days prior to his procedure with plans to restart since postoperative bleeding respectively minimized by his primary attending surgeon.  Patient is aware that his last dose of rivaroxaban will be on 04/29 /2024.  Patient denies previous perioperative complications with anesthesia in the past. In review of the available records, it is noted that patient underwent a general anesthetic course here at [facility] ([facility]) in 10/2018 without documented complications.      05/12/2022    1:37 PM 05/05/2022    2:45 PM 05/05/2022    9:59 AM  Vitals with BMI  Height 6\' 1"  6\' 1"    Weight 178 lbs 5 oz 184 lbs 183 lbs 10 oz  BMI 23.53 24.28 24.23  Systolic 124 122 629  Diastolic 79 60 80  Pulse 100 94 93    Providers/Specialists:   NOTE: Primary physician provider listed below. Patient may have been seen by APP or partner within same practice.   PROVIDER ROLE / SPECIALTY LAST Shanna Cisco, MD Vascular Surgery (Surgeon) 05/05/2022  Gracelyn Nurse, MD Primary Care Provider 05/10/2022  Julien Nordmann, MD Cardiology 05/05/2022  Theora Master, MD Neurology 09/14/2021   Allergies:  Other  Current Home Medications:   No current facility-administered medications for  this encounter.    atorvastatin (LIPITOR) 40 MG tablet   baclofen (LIORESAL) 10 MG tablet   Biotin 16109 MCG TABS   Cyanocobalamin (VITAMIN B-12 PO)   dapagliflozin propanediol (FARXIGA) 10 MG TABS  tablet   digoxin (LANOXIN) 0.25 MG tablet   hydroxypropyl methylcellulose / hypromellose (ISOPTO TEARS / GONIOVISC) 2.5 % ophthalmic solution   losartan (COZAAR) 25 MG tablet   metoprolol succinate (TOPROL-XL) 50 MG 24 hr tablet   rivaroxaban (XARELTO) 20 MG TABS tablet   traMADol (ULTRAM) 50 MG tablet   History:   Past Medical History:  Diagnosis Date   Acute on chronic HFrEF (heart failure with reduced ejection fraction) (HCC)    a.) TTE 05/18/2020: EF 45%, glob HK, mild LVH, mod reduced RVSF, mild RVE, mod LAE, sev RAE, mod MAC, triv MR, sev TR, mild-mod AoV sclerosis, (+) IAS (L-R); b.) TTE 04/19/2022: EF 30-35%, glob HK, LVH, mod RVE, PASP 40.3, mod TR, mild AR; c.) [facility] 04/20/2022: mRA 8, mPA 27, mPCWP 20, LVEDP 13, CO 4.97, CI 2.34   Aortic atherosclerosis (HCC)    Aortic root dilatation (HCC)    a.) TTE 05/18/2020: 42 mm; b.) TTE 04/19/2022: 40 mm   Atypical chest pain    a. 03/2012 St echo: nl EF, no wma's.   BPH with obstruction/lower urinary tract symptoms    CAD (coronary artery disease)    a.) R/LHC 04/20/2022: 100% RPDA, 40% D1, 30% m-dLAD - med mgmt   Calculus in bladder    Chronic prostatitis    Depression    Elevated PSA    Essential hypertension    History of kidney stones    Hyperlipidemia    Infrarenal abdominal aortic aneurysm (AAA) without rupture (HCC)    a.) CT abd/pelvis 04/18/2022: saccular infrarenal aneurysm measuring up to 5.1 cm   Long term current use of anticoagulant    a.) rivaroxaban   MVP (mitral valve prolapse)    NICM (nonischemic cardiomyopathy) (HCC)    a.) MPI 11/06/2014: EF 30-45%; b.) TTE 05/18/2020: EF 45%; c.) MPI 08/17/2020: EF 25%; d.) TTE 04/19/2022: EF 30-35%   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   a.) MRI brain 07/03/2008 - tiny nonhemorrhagic infarct LEFT posteral parietal lobe   Persistent atrial fibrillation (HCC)    a.) CHA2DS2VASc = 5 (age x2, HFrEF, HTN, vascular disease history);  b.) rate/rhythm maintained on oral  digoxin + metoprolol succinate; chronically anticoagulated with rivaroxaban   Psoriasis    Reflux    Sleep apnea    a.) does not utilize nocturnal PAP therapy   Vitamin B 12 deficiency    Past Surgical History:  Procedure Laterality Date   CATARACT EXTRACTION, BILATERAL     COLONOSCOPY WITH PROPOFOL     CYSTOSCOPY WITH LITHOLAPAXY N/A 11/01/2018   Procedure: CYSTOSCOPY WITH LITHOLAPAXY;  Surgeon: [name], MD;  Location: [facility] ORS;  Service: Urology;  Laterality: N/A;   HOLEP-LASER ENUCLEATION OF THE PROSTATE WITH MORCELLATION N/A 11/01/2018   Procedure: HOLEP-LASER ENUCLEATION OF THE PROSTATE WITH MORCELLATION;  Surgeon: [name], MD;  Location: [facility] ORS;  Service: Urology;  Laterality: N/A;   RIGHT/LEFT HEART CATH AND CORONARY ANGIOGRAPHY N/A 04/20/2022   Procedure: RIGHT/LEFT HEART CATH AND CORONARY ANGIOGRAPHY;  Surgeon: [name], MD;  Location: [facility] INVASIVE CV LAB;  Service: Cardiovascular;  Laterality: N/A;   TONSILLECTOMY     UMBILICAL HERNIA REPAIR     VARICOSE VEIN SURGERY     Family History  Problem Relation Age of  Onset   Heart attack Father    Hypertension Father    Hyperlipidemia Father    Social History   Tobacco Use   Smoking status: Never   Smokeless tobacco: Never  Vaping Use   Vaping Use: Never used  Substance Use Topics   Alcohol use: Yes    Alcohol/week: 1.0 standard drink of alcohol    Types: 1 Glasses of wine per week    Comment: social    Drug use: No    Pertinent Clinical Results:  LABS:   No visits with results within 3 Day(s) from this visit.  Latest known visit with results is:  Hospital Outpatient Visit on 05/12/2022  Component Date Value Ref Range Status   ABO/RH(D) 05/12/2022 O POS   Final   Antibody Screen 05/12/2022 NEG   Final   Sample Expiration 05/12/2022 05/26/2022,2359   Final   Extend sample reason 05/12/2022    Final                   Value:NO TRANSFUSIONS OR PREGNANCY IN THE PAST 3 MONTHS Performed  at [facility], [address] [place], [place] [address]     ECG: Date: 05/05/2022 Time ECG obtained: 1452 PM Rate: 95 bpm Rhythm: atrial fibrillation Axis (leads I and aVF): Normal Intervals: QRS 98 ms. QTc 417 ms. ST segment and T wave changes: Nonspecific ST abnormalities  Comparison: Similar to previous tracing obtained on 04/18/2022   IMAGING / PROCEDURES: RIGHT/LEFT HEART CATHETERIZATION performed on 04/20/2022 Mild to moderate nonobstructive coronary artery disease 100% RPDA 40% D1 30% mid to distal LAD with left to right collaterals Left ventricular angiography was not performed.  EF was moderately to severely reduced by echocardiogram Right heart catheterization showed moderately elevated left-sided filling pressures, mild pulmonary hypertension, and normal cardiac output Mean RA = 8 mmHg Mean PA = 27 mmHg Mean PCWP = 20 mmHg LVEDP = 13 mmHg CO = 4.97 L/min CI = 2.34 L/min/m Recommendations The has nonischemic cardiomyopathy likely tachycardia induced.   His coronary arteries his coronary arteries are heavily calcified but with no obstructive disease and does not require revascularization.  Recommend aggressive medical therapy. I increased the dose of Toprol for better rate control of atrial fibrillation. Heparin can be resumed at 6 PM and can be transition back to Xarelto if no plans for AAA repair during this admission. The patient is considered at moderate risk from a cardiac standpoint.   TRANSTHORACIC ECHOCARDIOGRAM performed on 04/19/2022 Left ventricular ejection fraction, by estimation, is 30 to 35%. The left ventricle has moderate to severely decreased function. The left ventricle demonstrates global hypokinesis. There is mild left ventricular hypertrophy. Left ventricular diastolic parameters are indeterminate.  Right ventricular systolic function is mildly reduced. The right ventricular size is moderately enlarged. There is mildly elevated  pulmonary artery systolic pressure.  Right atrial size was severely dilated.   The mitral valve is normal in structure. Mild mitral valve regurgitation.  Tricuspid valve regurgitation is moderate.  The aortic valve is tricuspid. Aortic valve regurgitation is mild. Aortic valve sclerosis/calcification is present, without any evidence of aortic stenosis.  Aortic dilatation noted. There is mild dilatation of the aortic root, measuring 40 mm.  The inferior vena cava is normal in size with <50% respiratory variability, suggesting right atrial pressure of 8 mmHg.   DG CHEST PORT 1 VIEW performed on 04/18/2022 Single frontal view of the chest demonstrates stable enlargement of the cardiac silhouette No acute airspace disease, effusion, or  pneumothorax No acute bony abnormality  CT ABDOMEN PELVIS W CONTRAST performed on 04/18/2022 Infrarenal saccular abdominal aortic aneurysm measuring up to 5.1 cm in maximum dimension. Vascular surgical consultation is recommended. Borderline prominence of gas and stool in the colon, query mild constipation. Moderate cardiomegaly with atherosclerotic calcification of the coronary arteries. Moderate degenerative hip arthropathy bilaterally. 5 x 4 mm right middle lobe pulmonary nodule. No follow-up imaging of this nodule is recommended. Aortic and coronary atherosclerosis.  2.3 by 2.0 cm exophytic hypodense lesion of the right kidney upper pole with internal density of 14 Hounsfield units and relatively homogeneous density characteristics. No follow up of this lesion is recommended. Aortic atherosclerosis   MYOCARDIAL PERFUSION IMAGING STUDY (LEXISCAN) performed on 08/17/2020 Severely reduced left ventricular systolic function with an EF of 25% Moderate in size, moderate in severity, partially reversible defect involving the inferoseptal myocardium.  Allowing for differences in technique, the inferolateral defect is similar to prior study obtained on  11/06/2014. Dense coronary artery calcifications as well as aortic atherosclerosis noted on attenuation correction CT images Abnormal, potentially high risk, pharmacologic myocardial perfusion stress test.  Impression and Plan:  Gary Dawson has been referred for pre-anesthesia review and clearance prior to him undergoing the planned anesthetic and procedural courses. Available labs, pertinent testing, and imaging results were personally reviewed by me in preparation for upcoming operative/procedural course. [facility] medical record has been updated following extensive record review and patient interview with PAT staff.   This patient has been appropriately cleared by cardiology with an overall MODERATE risk of significant perioperative cardiovascular complications. Based on clinical review performed today (05/16/22), barring any significant acute changes in the patient's overall condition, it is anticipated that he will be able to proceed with the planned surgical intervention. Any acute changes in clinical condition may necessitate his procedure being postponed and/or cancelled. Patient will meet with anesthesia team (MD and/or CRNA) on the day of his procedure for preoperative evaluation/assessment. Questions regarding anesthetic course will be fielded at that time.   Pre-surgical instructions were reviewed with the patient during his PAT appointment, and questions were fielded to satisfaction by PAT clinical staff. He has been instructed on which medications that he will need to hold prior to surgery, as well as the ones that have been deemed safe/appropriate to take on the day of his procedure. As part of the general education provided by PAT, patient made aware both verbally and in writing, that he would need to abstain from the use of any illegal substances during his perioperative course.  He was advised that failure to follow the provided instructions could necessitate case cancellation  or result serious perioperative complications up to and including death. Patient encouraged to contact PAT and/or his surgeon's office to discuss any questions or concerns that may arise prior to surgery; verbalized understanding.   Quentin Mulling, MSN, APRN, FNP-C, CEN [facility]  Peri-operative Services Nurse Practitioner Phone: [phone] Fax: [phone] 05/16/22 6:03 PM  NOTE: This note has been prepared using Dragon dictation software. Despite my best ability to proofread, there is always the potential that unintentional transcriptional errors may still occur from this process.

## 2022-05-17 DIAGNOSIS — G629 Polyneuropathy, unspecified: Secondary | ICD-10-CM | POA: Diagnosis not present

## 2022-05-17 DIAGNOSIS — I4821 Permanent atrial fibrillation: Secondary | ICD-10-CM | POA: Diagnosis not present

## 2022-05-17 DIAGNOSIS — I428 Other cardiomyopathies: Secondary | ICD-10-CM | POA: Diagnosis not present

## 2022-05-17 DIAGNOSIS — N400 Enlarged prostate without lower urinary tract symptoms: Secondary | ICD-10-CM | POA: Diagnosis not present

## 2022-05-17 DIAGNOSIS — I110 Hypertensive heart disease with heart failure: Secondary | ICD-10-CM | POA: Diagnosis not present

## 2022-05-17 DIAGNOSIS — I5023 Acute on chronic systolic (congestive) heart failure: Secondary | ICD-10-CM | POA: Diagnosis not present

## 2022-05-17 DIAGNOSIS — I7143 Infrarenal abdominal aortic aneurysm, without rupture: Secondary | ICD-10-CM | POA: Diagnosis not present

## 2022-05-18 ENCOUNTER — Encounter
Admission: RE | Disposition: A | Discharge status: Home / Self Care | Payer: Self-pay | Source: Home / Self Care | Attending: Vascular Surgery

## 2022-05-18 ENCOUNTER — Inpatient Hospital Stay
Admission: RE | Admit: 2022-05-18 | Discharge: 2022-05-19 | DRG: 269 | Disposition: A | Discharge status: Home / Self Care | Payer: Medicare HMO | Attending: Vascular Surgery | Admitting: Vascular Surgery

## 2022-05-18 ENCOUNTER — Inpatient Hospital Stay: Payer: Medicare HMO | Admitting: Urgent Care

## 2022-05-18 ENCOUNTER — Other Ambulatory Visit: Payer: Self-pay

## 2022-05-18 ENCOUNTER — Encounter: Payer: Self-pay | Admitting: Vascular Surgery

## 2022-05-18 DIAGNOSIS — I2510 Atherosclerotic heart disease of native coronary artery without angina pectoris: Secondary | ICD-10-CM | POA: Diagnosis not present

## 2022-05-18 DIAGNOSIS — N401 Enlarged prostate with lower urinary tract symptoms: Secondary | ICD-10-CM | POA: Diagnosis not present

## 2022-05-18 DIAGNOSIS — N411 Chronic prostatitis: Secondary | ICD-10-CM | POA: Diagnosis not present

## 2022-05-18 DIAGNOSIS — K219 Gastro-esophageal reflux disease without esophagitis: Secondary | ICD-10-CM | POA: Diagnosis present

## 2022-05-18 DIAGNOSIS — I428 Other cardiomyopathies: Secondary | ICD-10-CM | POA: Diagnosis not present

## 2022-05-18 DIAGNOSIS — I4891 Unspecified atrial fibrillation: Secondary | ICD-10-CM | POA: Diagnosis not present

## 2022-05-18 DIAGNOSIS — E785 Hyperlipidemia, unspecified: Secondary | ICD-10-CM | POA: Diagnosis not present

## 2022-05-18 DIAGNOSIS — G4730 Sleep apnea, unspecified: Secondary | ICD-10-CM | POA: Diagnosis not present

## 2022-05-18 DIAGNOSIS — I4819 Other persistent atrial fibrillation: Secondary | ICD-10-CM | POA: Diagnosis present

## 2022-05-18 DIAGNOSIS — I5022 Chronic systolic (congestive) heart failure: Secondary | ICD-10-CM | POA: Diagnosis not present

## 2022-05-18 DIAGNOSIS — I700 Atherosclerosis of aorta: Secondary | ICD-10-CM | POA: Diagnosis not present

## 2022-05-18 DIAGNOSIS — F03918 Unspecified dementia, unspecified severity, with other behavioral disturbance: Secondary | ICD-10-CM | POA: Diagnosis not present

## 2022-05-18 DIAGNOSIS — Z7901 Long term (current) use of anticoagulants: Secondary | ICD-10-CM | POA: Diagnosis not present

## 2022-05-18 DIAGNOSIS — Z87442 Personal history of urinary calculi: Secondary | ICD-10-CM

## 2022-05-18 DIAGNOSIS — Z8249 Family history of ischemic heart disease and other diseases of the circulatory system: Secondary | ICD-10-CM | POA: Diagnosis not present

## 2022-05-18 DIAGNOSIS — I110 Hypertensive heart disease with heart failure: Secondary | ICD-10-CM | POA: Diagnosis not present

## 2022-05-18 DIAGNOSIS — Z8673 Personal history of transient ischemic attack (TIA), and cerebral infarction without residual deficits: Secondary | ICD-10-CM | POA: Diagnosis not present

## 2022-05-18 DIAGNOSIS — I083 Combined rheumatic disorders of mitral, aortic and tricuspid valves: Secondary | ICD-10-CM | POA: Diagnosis present

## 2022-05-18 DIAGNOSIS — J302 Other seasonal allergic rhinitis: Secondary | ICD-10-CM | POA: Diagnosis not present

## 2022-05-18 DIAGNOSIS — I714 Abdominal aortic aneurysm, without rupture, unspecified: Principal | ICD-10-CM | POA: Diagnosis present

## 2022-05-18 DIAGNOSIS — Z79899 Other long term (current) drug therapy: Secondary | ICD-10-CM | POA: Diagnosis not present

## 2022-05-18 HISTORY — DX: Atherosclerotic heart disease of native coronary artery without angina pectoris: I25.10

## 2022-05-18 HISTORY — DX: Long term (current) use of anticoagulants: Z79.01

## 2022-05-18 HISTORY — DX: Atherosclerosis of aorta: I70.0

## 2022-05-18 HISTORY — PX: ENDOVASCULAR REPAIR/STENT GRAFT: CATH118280

## 2022-05-18 HISTORY — DX: Thoracic aortic ectasia: I77.810

## 2022-05-18 HISTORY — DX: Infrarenal abdominal aortic aneurysm, without rupture: I71.43

## 2022-05-18 LAB — TYPE AND SCREEN
ABO/RH(D): O POS
ABO/RH(D): O POS
Antibody Screen: NEGATIVE
Antibody Screen: NEGATIVE

## 2022-05-18 LAB — MRSA NEXT GEN BY PCR, NASAL: MRSA by PCR Next Gen: NOT DETECTED

## 2022-05-18 LAB — GLUCOSE, CAPILLARY: Glucose-Capillary: 94 mg/dL (ref 70–99)

## 2022-05-18 SURGERY — ENDOVASCULAR STENT GRAFT (AAA)
Anesthesia: General

## 2022-05-18 MED ORDER — CEFAZOLIN SODIUM-DEXTROSE 2-4 GM/100ML-% IV SOLN
2.0000 g | INTRAVENOUS | Status: AC
  Administered 2022-05-18: 2 g via INTRAVENOUS

## 2022-05-18 MED ORDER — CHLORHEXIDINE GLUCONATE CLOTH 2 % EX PADS
6.0000 | MEDICATED_PAD | Freq: Once | CUTANEOUS | Status: AC
  Administered 2022-05-18: 6 via TOPICAL

## 2022-05-18 MED ORDER — LIDOCAINE HCL (PF) 2 % IJ SOLN
INTRAMUSCULAR | Status: AC
  Filled 2022-05-18: qty 5

## 2022-05-18 MED ORDER — VITAMIN B-12 100 MCG PO TABS
100.0000 ug | ORAL_TABLET | Freq: Every day | ORAL | Status: DC
  Administered 2022-05-19: 100 ug via ORAL
  Filled 2022-05-18: qty 1

## 2022-05-18 MED ORDER — ESMOLOL HCL 100 MG/10ML IV SOLN
INTRAVENOUS | Status: DC | PRN
  Administered 2022-05-18: 10 mg via INTRAVENOUS

## 2022-05-18 MED ORDER — DROPERIDOL 2.5 MG/ML IJ SOLN: 0.6250 mg | Freq: Once | INTRAMUSCULAR | Status: DC | PRN

## 2022-05-18 MED ORDER — LACTATED RINGERS IV SOLN: INTRAVENOUS | Status: DC

## 2022-05-18 MED ORDER — DIGOXIN 250 MCG PO TABS
0.2500 mg | ORAL_TABLET | Freq: Every day | ORAL | Status: DC
  Administered 2022-05-19: 0.25 mg via ORAL
  Filled 2022-05-18: qty 1

## 2022-05-18 MED ORDER — PHENOL 1.4 % MT LIQD: 1.0000 | OROMUCOSAL | Status: DC | PRN

## 2022-05-18 MED ORDER — ACETAMINOPHEN 650 MG RE SUPP: 325.0000 mg | RECTAL | Status: DC | PRN

## 2022-05-18 MED ORDER — FAMOTIDINE IN NACL 20-0.9 MG/50ML-% IV SOLN
20.0000 mg | Freq: Two times a day (BID) | INTRAVENOUS | Status: DC
  Administered 2022-05-18 (×2): 20 mg via INTRAVENOUS
  Filled 2022-05-18 (×3): qty 50

## 2022-05-18 MED ORDER — PROMETHAZINE HCL 25 MG/ML IJ SOLN: 6.2500 mg | INTRAMUSCULAR | Status: DC | PRN

## 2022-05-18 MED ORDER — TRAMADOL HCL 50 MG PO TABS
50.0000 mg | ORAL_TABLET | Freq: Four times a day (QID) | ORAL | Status: DC | PRN
  Administered 2022-05-18: 50 mg via ORAL
  Filled 2022-05-18: qty 1

## 2022-05-18 MED ORDER — NITROGLYCERIN IN D5W 200-5 MCG/ML-% IV SOLN: 5.0000 ug/min | INTRAVENOUS | Status: DC

## 2022-05-18 MED ORDER — FENTANYL CITRATE (PF) 100 MCG/2ML IJ SOLN: 25.0000 ug | INTRAMUSCULAR | Status: DC | PRN

## 2022-05-18 MED ORDER — ROCURONIUM BROMIDE 100 MG/10ML IV SOLN
INTRAVENOUS | Status: DC | PRN
  Administered 2022-05-18: 60 mg via INTRAVENOUS

## 2022-05-18 MED ORDER — HYDRALAZINE HCL 20 MG/ML IJ SOLN: 5.0000 mg | INTRAMUSCULAR | Status: DC | PRN

## 2022-05-18 MED ORDER — ASPIRIN 81 MG PO TBEC
81.0000 mg | DELAYED_RELEASE_TABLET | Freq: Every day | ORAL | Status: DC
  Administered 2022-05-19: 81 mg via ORAL
  Filled 2022-05-18: qty 1

## 2022-05-18 MED ORDER — CHLORHEXIDINE GLUCONATE 0.12 % MT SOLN
15.0000 mL | Freq: Once | OROMUCOSAL | Status: AC
  Administered 2022-05-18: 15 mL via OROMUCOSAL
  Filled 2022-05-18 (×2): qty 15

## 2022-05-18 MED ORDER — CEFAZOLIN SODIUM-DEXTROSE 2-4 GM/100ML-% IV SOLN
2.0000 g | Freq: Three times a day (TID) | INTRAVENOUS | Status: AC
  Administered 2022-05-18 – 2022-05-19 (×2): 2 g via INTRAVENOUS
  Filled 2022-05-18 (×2): qty 100

## 2022-05-18 MED ORDER — ESMOLOL HCL 100 MG/10ML IV SOLN
INTRAVENOUS | Status: AC
  Filled 2022-05-18: qty 10

## 2022-05-18 MED ORDER — MORPHINE SULFATE (PF) 2 MG/ML IV SOLN: 2.0000 mg | INTRAVENOUS | Status: DC | PRN

## 2022-05-18 MED ORDER — FENTANYL CITRATE (PF) 100 MCG/2ML IJ SOLN
INTRAMUSCULAR | Status: DC | PRN
  Administered 2022-05-18 (×2): 50 ug via INTRAVENOUS

## 2022-05-18 MED ORDER — ALUM & MAG HYDROXIDE-SIMETH 200-200-20 MG/5ML PO SUSP: 15.0000 mL | ORAL | Status: DC | PRN

## 2022-05-18 MED ORDER — ONDANSETRON HCL 4 MG/2ML IJ SOLN
INTRAMUSCULAR | Status: AC
  Filled 2022-05-18: qty 2

## 2022-05-18 MED ORDER — FAMOTIDINE 20 MG PO TABS
ORAL_TABLET | ORAL | Status: AC
  Filled 2022-05-18: qty 1

## 2022-05-18 MED ORDER — DOCUSATE SODIUM 100 MG PO CAPS
100.0000 mg | ORAL_CAPSULE | Freq: Every day | ORAL | Status: DC
  Administered 2022-05-19: 100 mg via ORAL
  Filled 2022-05-18: qty 1

## 2022-05-18 MED ORDER — ACETAMINOPHEN 10 MG/ML IV SOLN: 1000.0000 mg | Freq: Once | INTRAVENOUS | Status: DC | PRN

## 2022-05-18 MED ORDER — ROCURONIUM BROMIDE 10 MG/ML (PF) SYRINGE
PREFILLED_SYRINGE | INTRAVENOUS | Status: AC
  Filled 2022-05-18: qty 10

## 2022-05-18 MED ORDER — GLYCOPYRROLATE 0.2 MG/ML IJ SOLN
INTRAMUSCULAR | Status: DC | PRN
  Administered 2022-05-18: .2 mg via INTRAVENOUS

## 2022-05-18 MED ORDER — ETOMIDATE 2 MG/ML IV SOLN
INTRAVENOUS | Status: DC | PRN
  Administered 2022-05-18: 20 mg via INTRAVENOUS

## 2022-05-18 MED ORDER — ONDANSETRON HCL 4 MG/2ML IJ SOLN: 4.0000 mg | Freq: Four times a day (QID) | INTRAMUSCULAR | Status: DC | PRN

## 2022-05-18 MED ORDER — ATORVASTATIN CALCIUM 20 MG PO TABS
40.0000 mg | ORAL_TABLET | Freq: Every evening | ORAL | Status: DC
  Administered 2022-05-18: 40 mg via ORAL
  Filled 2022-05-18: qty 2

## 2022-05-18 MED ORDER — DEXAMETHASONE SODIUM PHOSPHATE 10 MG/ML IJ SOLN
INTRAMUSCULAR | Status: DC | PRN
  Administered 2022-05-18: 10 mg via INTRAVENOUS

## 2022-05-18 MED ORDER — MENTHOL 3 MG MT LOZG
1.0000 | LOZENGE | OROMUCOSAL | Status: DC | PRN
  Filled 2022-05-18: qty 9

## 2022-05-18 MED ORDER — BACLOFEN 10 MG PO TABS
10.0000 mg | ORAL_TABLET | Freq: Two times a day (BID) | ORAL | Status: DC
  Administered 2022-05-18 – 2022-05-19 (×3): 10 mg via ORAL
  Filled 2022-05-18 (×3): qty 1

## 2022-05-18 MED ORDER — LABETALOL HCL 5 MG/ML IV SOLN: 10.0000 mg | INTRAVENOUS | Status: DC | PRN

## 2022-05-18 MED ORDER — SUGAMMADEX SODIUM 200 MG/2ML IV SOLN
INTRAVENOUS | Status: DC | PRN
  Administered 2022-05-18: 200 mg via INTRAVENOUS

## 2022-05-18 MED ORDER — PHENYLEPHRINE HCL-NACL 20-0.9 MG/250ML-% IV SOLN
INTRAVENOUS | Status: AC
  Filled 2022-05-18: qty 250

## 2022-05-18 MED ORDER — ONDANSETRON HCL 4 MG/2ML IJ SOLN
4.0000 mg | Freq: Four times a day (QID) | INTRAMUSCULAR | Status: DC | PRN
  Administered 2022-05-18 – 2022-05-19 (×2): 4 mg via INTRAVENOUS

## 2022-05-18 MED ORDER — DOPAMINE-DEXTROSE 3.2-5 MG/ML-% IV SOLN: 3.0000 ug/kg/min | INTRAVENOUS | Status: DC

## 2022-05-18 MED ORDER — GLYCOPYRROLATE 0.2 MG/ML IJ SOLN
INTRAMUSCULAR | Status: AC
  Filled 2022-05-18: qty 1

## 2022-05-18 MED ORDER — CHLORHEXIDINE GLUCONATE CLOTH 2 % EX PADS
6.0000 | MEDICATED_PAD | Freq: Every day | CUTANEOUS | Status: DC
  Administered 2022-05-18 – 2022-05-19 (×2): 6 via TOPICAL

## 2022-05-18 MED ORDER — SODIUM CHLORIDE 0.9 % IV SOLN: INTRAVENOUS | Status: DC

## 2022-05-18 MED ORDER — FENTANYL CITRATE (PF) 100 MCG/2ML IJ SOLN
INTRAMUSCULAR | Status: AC
  Filled 2022-05-18: qty 2

## 2022-05-18 MED ORDER — LOSARTAN POTASSIUM 50 MG PO TABS
25.0000 mg | ORAL_TABLET | Freq: Every day | ORAL | Status: DC
  Administered 2022-05-18 – 2022-05-19 (×2): 25 mg via ORAL
  Filled 2022-05-18 (×2): qty 1

## 2022-05-18 MED ORDER — BIOTIN 10000 MCG PO TABS: 1.0000 | ORAL_TABLET | Freq: Every day | ORAL | Status: DC

## 2022-05-18 MED ORDER — METOPROLOL SUCCINATE ER 50 MG PO TB24
50.0000 mg | ORAL_TABLET | Freq: Two times a day (BID) | ORAL | Status: DC
  Administered 2022-05-18 – 2022-05-19 (×2): 50 mg via ORAL
  Filled 2022-05-18 (×2): qty 1

## 2022-05-18 MED ORDER — HEPARIN SODIUM (PORCINE) 1000 UNIT/ML IJ SOLN
INTRAMUSCULAR | Status: DC | PRN
  Administered 2022-05-18: 8000 [IU] via INTRAVENOUS

## 2022-05-18 MED ORDER — CEFAZOLIN SODIUM-DEXTROSE 2-4 GM/100ML-% IV SOLN
INTRAVENOUS | Status: AC
  Filled 2022-05-18: qty 100

## 2022-05-18 MED ORDER — POTASSIUM CHLORIDE CRYS ER 20 MEQ PO TBCR: 20.0000 meq | EXTENDED_RELEASE_TABLET | Freq: Every day | ORAL | Status: DC | PRN

## 2022-05-18 MED ORDER — IODIXANOL 320 MG/ML IV SOLN
INTRAVENOUS | Status: DC | PRN
  Administered 2022-05-18: 50 mL

## 2022-05-18 MED ORDER — ORAL CARE MOUTH RINSE: 15.0000 mL | OROMUCOSAL | Status: DC | PRN

## 2022-05-18 MED ORDER — MAGNESIUM SULFATE 2 GM/50ML IV SOLN: 2.0000 g | Freq: Every day | INTRAVENOUS | Status: DC | PRN

## 2022-05-18 MED ORDER — SODIUM CHLORIDE 0.9 % IV SOLN: 500.0000 mL | Freq: Once | INTRAVENOUS | Status: DC | PRN

## 2022-05-18 MED ORDER — SODIUM CHLORIDE 0.9 % IV SOLN: INTRAVENOUS | Status: DC | PRN

## 2022-05-18 MED ORDER — POLYVINYL ALCOHOL 1.4 % OP SOLN
1.0000 [drp] | Freq: Three times a day (TID) | OPHTHALMIC | Status: DC
  Administered 2022-05-18 – 2022-05-19 (×3): 1 [drp] via OPHTHALMIC
  Filled 2022-05-18 (×2): qty 15

## 2022-05-18 MED ORDER — LIDOCAINE HCL (CARDIAC) PF 100 MG/5ML IV SOSY
PREFILLED_SYRINGE | INTRAVENOUS | Status: DC | PRN
  Administered 2022-05-18: 60 mg via INTRAVENOUS

## 2022-05-18 MED ORDER — PHENYLEPHRINE HCL-NACL 20-0.9 MG/250ML-% IV SOLN
INTRAVENOUS | Status: DC | PRN
  Administered 2022-05-18: 40 ug/min via INTRAVENOUS

## 2022-05-18 MED ORDER — ACETAMINOPHEN 325 MG PO TABS
325.0000 mg | ORAL_TABLET | ORAL | Status: DC | PRN
  Administered 2022-05-18: 650 mg via ORAL
  Filled 2022-05-18: qty 2

## 2022-05-18 MED ORDER — OXYCODONE HCL 5 MG/5ML PO SOLN: 5.0000 mg | Freq: Once | ORAL | Status: DC | PRN

## 2022-05-18 MED ORDER — DEXAMETHASONE SODIUM PHOSPHATE 10 MG/ML IJ SOLN
INTRAMUSCULAR | Status: AC
  Filled 2022-05-18: qty 1

## 2022-05-18 MED ORDER — OXYCODONE-ACETAMINOPHEN 5-325 MG PO TABS: 1.0000 | ORAL_TABLET | ORAL | Status: DC | PRN

## 2022-05-18 MED ORDER — GUAIFENESIN-DM 100-10 MG/5ML PO SYRP: 15.0000 mL | ORAL_SOLUTION | ORAL | Status: DC | PRN

## 2022-05-18 MED ORDER — ETOMIDATE 2 MG/ML IV SOLN
INTRAVENOUS | Status: AC
  Filled 2022-05-18: qty 10

## 2022-05-18 MED ORDER — ORAL CARE MOUTH RINSE: 15.0000 mL | Freq: Once | OROMUCOSAL | Status: AC

## 2022-05-18 MED ORDER — DAPAGLIFLOZIN PROPANEDIOL 10 MG PO TABS
10.0000 mg | ORAL_TABLET | ORAL | Status: DC
  Administered 2022-05-19: 10 mg via ORAL
  Filled 2022-05-18: qty 1

## 2022-05-18 MED ORDER — METOPROLOL TARTRATE 5 MG/5ML IV SOLN: 2.0000 mg | INTRAVENOUS | Status: DC | PRN

## 2022-05-18 MED ORDER — OXYCODONE HCL 5 MG PO TABS: 5.0000 mg | ORAL_TABLET | Freq: Once | ORAL | Status: DC | PRN

## 2022-05-18 MED ORDER — HEPARIN SODIUM (PORCINE) 1000 UNIT/ML IJ SOLN
INTRAMUSCULAR | Status: AC
  Filled 2022-05-18: qty 10

## 2022-05-18 MED ORDER — HYDROMORPHONE HCL 1 MG/ML IJ SOLN: 1.0000 mg | Freq: Once | INTRAMUSCULAR | Status: DC | PRN

## 2022-05-18 MED ORDER — FAMOTIDINE 20 MG PO TABS
20.0000 mg | ORAL_TABLET | Freq: Once | ORAL | Status: AC
  Administered 2022-05-18: 20 mg via ORAL

## 2022-05-18 SURGICAL SUPPLY — 46 items
ADH SKN CLS APL DERMABOND .7 (GAUZE/BANDAGES/DRESSINGS) ×1
BAG DECANTER FOR FLEXI CONT (MISCELLANEOUS) IMPLANT
BLADE SURG SZ11 CARB STEEL (BLADE) IMPLANT
BRUSH SCRUB EZ  4% CHG (MISCELLANEOUS) ×1
BRUSH SCRUB EZ 4% CHG (MISCELLANEOUS) IMPLANT
CATH ACCU-VU SIZ PIG 5F 70CM (CATHETERS) IMPLANT
CATH BALLN CODA 9X100X32 (BALLOONS) IMPLANT
CATH BEACON 5 .035 65 KMP TIP (CATHETERS) IMPLANT
CLOSURE PERCLOSE PROSTYLE (VASCULAR PRODUCTS) IMPLANT
COVER DRAPE FLUORO 36X44 (DRAPES) IMPLANT
COVER PROBE ULTRASOUND 5X96 (MISCELLANEOUS) IMPLANT
DERMABOND ADVANCED .7 DNX12 (GAUZE/BANDAGES/DRESSINGS) IMPLANT
DEVICE SAFEGUARD 24CM (GAUZE/BANDAGES/DRESSINGS) IMPLANT
DEVICE TORQUE (MISCELLANEOUS) IMPLANT
EXCLDR TRNK ENDO 26X14.5X12 16 (Endovascular Graft) ×1 IMPLANT
EXCLUDER TNK END 26X14.5X12 16 (Endovascular Graft) IMPLANT
GLIDEWIRE STIFF .35X180X3 HYDR (WIRE) IMPLANT
GLOVE BIO SURGEON STRL SZ7 (GLOVE) IMPLANT
GLOVE SURG SYN 8.0 (GLOVE) ×1 IMPLANT
GLOVE SURG SYN 8.0 PF PI (GLOVE) IMPLANT
GOWN STRL REUS W/ TWL LRG LVL3 (GOWN DISPOSABLE) IMPLANT
GOWN STRL REUS W/ TWL XL LVL3 (GOWN DISPOSABLE) IMPLANT
GOWN STRL REUS W/TWL LRG LVL3 (GOWN DISPOSABLE) ×1
GOWN STRL REUS W/TWL XL LVL3 (GOWN DISPOSABLE) ×1
IV NS 500ML (IV SOLUTION) ×1
IV NS 500ML BAXH (IV SOLUTION) IMPLANT
LEG CONTRALATERAL 16X16X9.5 (Endovascular Graft) IMPLANT
LEG CONTRALETERAL16X16X11.5 (Endovascular Graft) ×1 IMPLANT
NDL ENTRY 21GA 7CM ECHOTIP (NEEDLE) IMPLANT
NEEDLE ENTRY 21GA 7CM ECHOTIP (NEEDLE) ×1 IMPLANT
PACK ANGIOGRAPHY (CUSTOM PROCEDURE TRAY) ×1 IMPLANT
PACK BASIN MAJOR ARMC (MISCELLANEOUS) IMPLANT
SET INTRO CAPELLA COAXIAL (SET/KITS/TRAYS/PACK) IMPLANT
SHEATH BRITE TIP 6FRX11 (SHEATH) IMPLANT
SHEATH BRITE TIP 8FRX11 (SHEATH) IMPLANT
SHEATH DRYSEAL FLEX 12FR 33CM (SHEATH) IMPLANT
SHEATH DRYSEAL FLEX 16FR 33CM (SHEATH) IMPLANT
SPONGE XRAY 4X4 16PLY STRL (MISCELLANEOUS) IMPLANT
STENT GRAFT CONTRALAT 16X11.5 (Endovascular Graft) IMPLANT
SUT MNCRL 4-0 (SUTURE) ×1
SUT MNCRL 4-0 27XMFL (SUTURE) ×1
SUTURE MNCRL 4-0 27XMF (SUTURE) IMPLANT
SYR MEDRAD MARK 7 150ML (SYRINGE) IMPLANT
TUBING CONTRAST HIGH PRESS 72 (TUBING) IMPLANT
WIRE AMPLATZ SS-J .035X260CM (WIRE) IMPLANT
WIRE GUIDERIGHT .035X150 (WIRE) IMPLANT

## 2022-05-18 NOTE — Op Note (Signed)
OPERATIVE NOTE   PROCEDURE: US guidance for vascular access, bilateral femoral arteries Catheter placement into aorta from bilateral femoral approaches Placement of a C3 26 x 14 x 12  Conformable Gore Excluder Endoprosthesis main body and a 16 x 10 ipsilateral extension with a 16 x 12 contralateral limb ProGlide closure devices bilateral femoral arteries  PRE-OPERATIVE DIAGNOSIS: AAA  POST-OPERATIVE DIAGNOSIS: same  SURGEON: Levora Dredge, MD and Festus Barren, MD - Co-surgeons  ANESTHESIA: general  ESTIMATED BLOOD LOSS: 25 cc  FINDING(S): 1.  AAA measuring 5.1 cm  SPECIMEN(S):  none  INDICATIONS:   Gary Dawson is a 82 y.o. y.o. male who presents with a large 5 cm saccular abdominal aortic aneurysm.  Anatomy is suitable for endovascular repair.  Risks and benefits have been reviewed all questions have been answered alternative therapies were discussed patient and family are in agreement with proceeding with endovascular repair to prevent lethal rupture.  DESCRIPTION: After obtaining full informed written consent, the patient was brought back to the operating room and placed supine upon the operating table.  The patient received IV antibiotics prior to induction.  After obtaining adequate anesthesia, the patient was prepped and draped in the standard fashion for endovascular AAA repair.    Co-surgeons are required because this is a complex bilateral procedure with work being performed simultaneously from both the right femoral and left femoral approach.  This also expedites the procedure making a shorter operative time reducing complications and improving patient safety.  We then began by gaining access to both femoral arteries with US guidance with me working on the patient's left and Dr. Wyn Quaker working on the right of the patient.  The femoral arteries were found to be patent and accessed without difficulty with a needle under ultrasound guidance without difficulty on  each side and permanent images were recorded.  We then placed 2 proglide devices on each side in a pre-close fashion and placed 8 French sheaths.  The patient was then given 8000 units of intravenous heparin.   The Pigtail catheter was placed into the aorta from the left side. Using this image, we selected a 26 x 14 x 12 Conformable Main body device.  Over a stiff wire, an 16 French sheath was placed. The main body was then placed through the 16 French sheath. A Kumpe catheter was placed up the right side and a magnified image at the renal arteries was performed. The main body was then deployed just below the lowest renal artery. The Kumpe catheter was used to cannulate the contralateral gate without difficulty and successful cannulation was confirmed by twirling the pigtail catheter in the main body. We then placed a stiff wire and a retrograde arteriogram was performed through the left femoral sheath. We upsized to the 12 [place] sheath for the contralateral limb and a 16 x 12 limb was selected and deployed. The main body deployment was then completed. Based off the angiographic findings, extension limbs were necessary on the right.  A 16 x 10 ipsilateral extension was then selected and deployed. All junction points and seals zones were treated with the compliant balloon.   The pigtail catheter was then replaced and a completion angiogram was performed.   No endoleak was detected on completion angiography. The renal arteries were found to be widely patent.    At this point we elected to terminate the procedure. We secured the pro glide devices for hemostasis on the femoral arteries. The skin incision was closed with a  4-0 Monocryl. Dermabond and pressure dressing were placed. The patient was taken to the recovery room in stable condition having tolerated the procedure well.  COMPLICATIONS: none  CONDITION: stable  Levora Dredge  05/18/2022, 12:20 PM

## 2022-05-18 NOTE — Anesthesia Procedure Notes (Signed)
Procedure Name: Intubation Date/Time: 05/18/2022 8:39 AM  Performed by: Morene Crocker, CRNAPre-anesthesia Checklist: Patient identified, Patient being monitored, Timeout performed, Emergency Drugs available and Suction available Patient Re-evaluated:Patient Re-evaluated prior to induction Oxygen Delivery Method: Circle system utilized Preoxygenation: Pre-oxygenation with 100% oxygen Induction Type: IV induction Ventilation: Oral airway inserted - appropriate to patient size and Two handed mask ventilation required Laryngoscope Size: 3 and McGraph Grade View: Grade I Tube type: Oral Tube size: 7.5 mm Number of attempts: 1 Airway Equipment and Method: Stylet Placement Confirmation: ETT inserted through vocal cords under direct vision, positive ETCO2 and breath sounds checked- equal and bilateral Secured at: 23 cm Tube secured with: Tape Dental Injury: Teeth and Oropharynx as per pre-operative assessment  Comments: Smooth atraumatic intubation, no complications noted.

## 2022-05-18 NOTE — Transfer of Care (Signed)
Immediate Anesthesia Transfer of Care Note  Patient: Gary Dawson  Procedure(s) Performed: ENDOVASCULAR REPAIR/STENT GRAFT  Patient Location: PACU  Anesthesia Type:General  Level of Consciousness: awake, alert , and oriented  Airway & Oxygen Therapy: Patient Spontanous Breathing and Patient connected to face mask oxygen  Post-op Assessment: Report given to RN and Post -op Vital signs reviewed and stable  Post vital signs: Reviewed and stable  Last Vitals:  Vitals Value Taken Time  BP 165/81 05/18/22 1030  Temp 36.1 C 05/18/22 1021  Pulse 73 05/18/22 1031  Resp 19 05/18/22 1031  SpO2 100 % 05/18/22 1031  Vitals shown include unvalidated device data.  Last Pain:  Vitals:   05/18/22 0727  TempSrc: Oral  PainSc: 0-No pain       Pt. Transported to PACU with monitors attached, on transport monitor. Awake and alert. No complains of pain/discomfort.   Complications: No notable events documented.

## 2022-05-18 NOTE — Interval H&P Note (Signed)
History and Physical Interval Note:  05/18/2022 8:09 AM  Gary Dawson  has presented today for surgery, with the diagnosis of AAA   GORE   ANESTHESIA    AAA repair Dr Wyn Quaker w [facility] to assist.  The various methods of treatment have been discussed with the patient and family. After consideration of risks, benefits and other options for treatment, the patient has consented to  Procedure(s): ENDOVASCULAR REPAIR/STENT GRAFT (N/A) as a surgical intervention.  The patient's history has been reviewed, patient examined, no change in status, stable for surgery.  I have reviewed the patient's chart and labs.  Questions were answered to the patient's satisfaction.     Gary Dawson

## 2022-05-18 NOTE — Anesthesia Preprocedure Evaluation (Signed)
Anesthesia Evaluation  Patient identified by MRN, date of birth, ID band Patient awake    Reviewed: Allergy & Precautions, H&P , NPO status , Patient's Chart, lab work & pertinent test results, reviewed documented beta blocker date and time   Airway Mallampati: III  TM Distance: >3 FB Neck ROM: full    Dental  (+) Teeth Intact   Pulmonary shortness of breath and with exertion, sleep apnea    Pulmonary exam normal        Cardiovascular Exercise Tolerance: Poor hypertension, On Medications + CAD  Normal cardiovascular exam Rhythm:regular Rate:Normal     Neuro/Psych  PSYCHIATRIC DISORDERS  Depression     Neuromuscular disease CVA, No Residual Symptoms    GI/Hepatic negative GI ROS, Neg liver ROS,,,  Endo/Other  negative endocrine ROS    Renal/GU CRFRenal disease  negative genitourinary   Musculoskeletal   Abdominal   Peds  Hematology negative hematology ROS (+)   Anesthesia Other Findings Past Medical History: No date: Acute on chronic HFrEF (heart failure with reduced ejection  fraction) (HCC)     Comment:  a.) TTE 05/18/2020: EF 45%, glob HK, mild LVH, mod               reduced RVSF, mild RVE, mod LAE, sev RAE, mod MAC, triv               MR, sev TR, mild-mod AoV sclerosis, (+) IAS (L-R); b.)               TTE 04/19/2022: EF 30-35%, glob HK, LVH, mod RVE, PASP               40.3, mod TR, mild AR; c.) [facility] 04/20/2022: mRA 8, mPA               27, mPCWP 20, LVEDP 13, CO 4.97, CI 2.34 No date: Aortic atherosclerosis (HCC) No date: Aortic root dilatation (HCC)     Comment:  a.) TTE 05/18/2020: 42 mm; b.) TTE 04/19/2022: 40 mm No date: Atypical chest pain     Comment:  a. 03/2012 St echo: nl EF, no wma's. No date: BPH with obstruction/lower urinary tract symptoms No date: CAD (coronary artery disease)     Comment:  a.) R/LHC 04/20/2022: 100% RPDA, 40% D1, 30% m-dLAD -               med mgmt No date: Calculus in  bladder No date: Chronic prostatitis No date: Depression No date: Elevated PSA No date: Essential hypertension No date: History of kidney stones No date: Hyperlipidemia No date: Infrarenal abdominal aortic aneurysm (AAA) without rupture  (HCC)     Comment:  a.) CT abd/pelvis 04/18/2022: saccular infrarenal               aneurysm measuring up to 5.1 cm No date: Long term current use of anticoagulant     Comment:  a.) rivaroxaban No date: MVP (mitral valve prolapse) No date: NICM (nonischemic cardiomyopathy) (HCC)     Comment:  a.) MPI 11/06/2014: EF 30-45%; b.) TTE 05/18/2020: EF               45%; c.) MPI 08/17/2020: EF 25%; d.) TTE 04/19/2022: EF               30-35% 07/03/2008: Parietal lobe infarction (HCC)     Comment:  a.) MRI brain 07/03/2008 - tiny nonhemorrhagic infarct  LEFT posteral parietal lobe No date: Persistent atrial fibrillation (HCC)     Comment:  a.) CHA2DS2VASc = 5 (age x2, HFrEF, HTN, vascular               disease history);  b.) rate/rhythm maintained on oral               digoxin + metoprolol succinate; chronically               anticoagulated with rivaroxaban No date: Psoriasis No date: Reflux No date: Sleep apnea     Comment:  a.) does not utilize nocturnal PAP therapy No date: Vitamin B 12 deficiency Past Surgical History: No date: CATARACT EXTRACTION, BILATERAL No date: COLONOSCOPY WITH PROPOFOL 11/01/2018: CYSTOSCOPY WITH LITHOLAPAXY; N/A     Comment:  Procedure: CYSTOSCOPY WITH LITHOLAPAXY;  Surgeon:               [name], MD;  Location: [facility] ORS;  Service:               Urology;  Laterality: N/A; 11/01/2018: HOLEP-LASER ENUCLEATION OF THE PROSTATE WITH  MORCELLATION; N/A     Comment:  Procedure: HOLEP-LASER ENUCLEATION OF THE PROSTATE WITH               MORCELLATION;  Surgeon: [name], MD;  Location:               [facility] ORS;  Service: Urology;  Laterality: N/A; 04/20/2022: RIGHT/LEFT HEART CATH AND CORONARY  ANGIOGRAPHY; N/A     Comment:  Procedure: RIGHT/LEFT HEART CATH AND CORONARY               ANGIOGRAPHY;  Surgeon: [name], MD;  Location:               [facility] INVASIVE CV LAB;  Service: Cardiovascular;                Laterality: N/A; No date: TONSILLECTOMY No date: UMBILICAL HERNIA REPAIR No date: VARICOSE VEIN SURGERY   Reproductive/Obstetrics negative OB ROS                             Anesthesia Physical Anesthesia Plan  ASA: 4  Anesthesia Plan: General ETT   Post-op Pain Management:    Induction:   PONV Risk Score and Plan: 3  Airway Management Planned:   Additional Equipment:   Intra-op Plan:   Post-operative Plan:   Informed Consent: I have reviewed the patients History and Physical, chart, labs and discussed the procedure including the risks, benefits and alternatives for the proposed anesthesia with the patient or authorized representative who has indicated his/her understanding and acceptance.     Dental Advisory Given  Plan Discussed with: CRNA  Anesthesia Plan Comments:        Anesthesia Quick Evaluation

## 2022-05-18 NOTE — Op Note (Signed)
OPERATIVE NOTE   PROCEDURE: US guidance for vascular access, bilateral femoral arteries Catheter placement into aorta from bilateral femoral approaches Placement of a 26 mm x 12 cm C3 conformable Gore Excluder Endoprosthesis main body right with a 16 mm diameter x 12 cm length left iliac contralateral limb Placement of a 12 mm x 10 cm right iliac extension limb ProGlide closure devices bilateral femoral arteries  PRE-OPERATIVE DIAGNOSIS: AAA  POST-OPERATIVE DIAGNOSIS: same  SURGEON: Festus Barren, MD and Levora Dredge, MD - Co-surgeons  ANESTHESIA: general  ESTIMATED BLOOD LOSS: 25 cc  FINDING(S): 1.  AAA  SPECIMEN(S):  none  INDICATIONS:   Gary Dawson is a 82 y.o. male who presents with roughly 5 cm saccular AAA. The anatomy was suitable for endovascular repair.  Risks and benefits of repair in an endovascular fashion were discussed and informed consent was obtained. Co-surgeons are used to expedite the procedure and reduce operative time as bilateral work needs to be done.  DESCRIPTION: After obtaining full informed written consent, the patient was brought back to the operating room and placed supine upon the operating table.  The patient received IV antibiotics prior to induction.  After obtaining adequate anesthesia, the patient was prepped and draped in the standard fashion for endovascular AAA repair.  We then began by gaining access to both femoral arteries with US guidance with me working on the right and Dr. Gilda Crease working on the left.  The femoral arteries were found to be patent and accessed without difficulty with a needle under ultrasound guidance without difficulty on each side and permanent images were recorded.  We then placed 2 proglide devices on each side in a pre-close fashion and placed 8 French sheaths. The patient was then given 8000 units of intravenous heparin. The Pigtail catheter was placed into the aorta from the left side. Using this image, we  selected a 26 mm diameter conformable C3 Gore Excluder Main body device.  Over a stiff wire, an 16 French sheath was placed up the right side. The main body was then placed through the 16 French sheath. A Kumpe catheter was placed up the left side and a magnified image at the renal arteries was performed. The main body was then deployed just below the lowest renal artery which was the left. The Kumpe catheter was used to cannulate the contralateral gate without difficulty and successful cannulation was confirmed by twirling the pigtail catheter in the main body. We then placed a stiff wire and a retrograde arteriogram was performed through the left femoral sheath. We upsized to the 12 [place] sheath for the contralateral limb and a 16 cm diameter x 12 cm length limb was selected and deployed. The main body deployment was then completed. Based off the angiographic findings, extension limbs were necessary.  A 16 mm diameter x 10 cm length right iliac extension limb was deployed to just above the hypogastric artery on the right. All junction points and seals zones were treated with the compliant balloon. The pigtail catheter was then replaced and a completion angiogram was performed.  A possible small type II endoleak was detected on completion angiography.  No type I or III endoleak's were identified.  The renal arteries were found to be widely patent.  Both hypogastric arteries were found to be widely patent. At this point we elected to terminate the procedure. We secured the pro glide devices for hemostasis on the femoral arteries. The skin incision was closed with a 4-0 Monocryl. Dermabond  and pressure dressing were placed. The patient was taken to the recovery room in stable condition having tolerated the procedure well.  COMPLICATIONS: none  CONDITION: stable  Festus Barren  05/18/2022, 10:02 AM   This note was created with Dragon Medical transcription system. Any errors in dictation are purely  unintentional.

## 2022-05-18 NOTE — Anesthesia Postprocedure Evaluation (Signed)
Anesthesia Post Note  Patient: Gary Dawson  Procedure(s) Performed: ENDOVASCULAR REPAIR/STENT GRAFT  Patient location during evaluation: PACU Anesthesia Type: General Level of consciousness: awake and alert Pain management: pain level controlled Vital Signs Assessment: post-procedure vital signs reviewed and stable Respiratory status: spontaneous breathing, nonlabored ventilation, respiratory function stable and patient connected to nasal cannula oxygen Cardiovascular status: blood pressure returned to baseline and stable Postop Assessment: no apparent nausea or vomiting Anesthetic complications: no   No notable events documented.   Last Vitals:  Vitals:   05/18/22 1128 05/18/22 1200  BP: (!) 151/80 (!) 162/80  Pulse: 80 81  Resp: 20 17  Temp: (!) 36.1 C   SpO2: 99% 100%    Last Pain:  Vitals:   05/18/22 1100  TempSrc:   PainSc: 0-No pain                 Yevette Edwards

## 2022-05-19 ENCOUNTER — Encounter: Payer: Self-pay | Admitting: Vascular Surgery

## 2022-05-19 ENCOUNTER — Ambulatory Visit: Payer: Medicare HMO | Admitting: Medical

## 2022-05-19 LAB — CBC
HCT: 34.1 % — ABNORMAL LOW (ref 39.0–52.0)
Hemoglobin: 11.7 g/dL — ABNORMAL LOW (ref 13.0–17.0)
MCH: 33.8 pg (ref 26.0–34.0)
MCHC: 34.3 g/dL (ref 30.0–36.0)
MCV: 98.6 fL (ref 80.0–100.0)
Platelets: 141 10*3/uL — ABNORMAL LOW (ref 150–400)
RBC: 3.46 MIL/uL — ABNORMAL LOW (ref 4.22–5.81)
RDW: 13.2 % (ref 11.5–15.5)
WBC: 12 10*3/uL — ABNORMAL HIGH (ref 4.0–10.5)
nRBC: 0 % (ref 0.0–0.2)

## 2022-05-19 LAB — BASIC METABOLIC PANEL
Anion gap: 6 (ref 5–15)
BUN: 18 mg/dL (ref 8–23)
CO2: 24 mmol/L (ref 22–32)
Calcium: 8.4 mg/dL — ABNORMAL LOW (ref 8.9–10.3)
Chloride: 108 mmol/L (ref 98–111)
Creatinine, Ser: 0.84 mg/dL (ref 0.61–1.24)
GFR, Estimated: >60 mL/min (ref >60)
Glucose, Bld: 129 mg/dL — ABNORMAL HIGH (ref 70–99)
Potassium: 4 mmol/L (ref 3.5–5.1)
Sodium: 138 mmol/L (ref 135–145)

## 2022-05-19 MED ORDER — ASPIRIN 81 MG PO TBEC: 81.0000 mg | DELAYED_RELEASE_TABLET | Freq: Every day | ORAL | 12 refills | Status: DC

## 2022-05-19 NOTE — Discharge Summary (Signed)
[facility]    Discharge Summary    Patient ID:  Gary Dawson MRN: [id number] DOB/AGE: Sep 19, 1940 82 y.o.  Admit date: 05/18/2022 Discharge date: 05/19/2022 Date of Surgery: 05/18/2022 Surgeon: Surgeon(s): Dew, Marlow Baars, MD  Admission Diagnosis: AAA (abdominal aortic aneurysm) without rupture [facility]) [I71.40]  Discharge Diagnoses:  AAA (abdominal aortic aneurysm) without rupture (HCC) [I71.40]  Secondary Diagnoses: Past Medical History:  Diagnosis Date   Acute on chronic HFrEF (heart failure with reduced ejection fraction) (HCC)    a.) TTE 05/18/2020: EF 45%, glob HK, mild LVH, mod reduced RVSF, mild RVE, mod LAE, sev RAE, mod MAC, triv MR, sev TR, mild-mod AoV sclerosis, (+) IAS (L-R); b.) TTE 04/19/2022: EF 30-35%, glob HK, LVH, mod RVE, PASP 40.3, mod TR, mild AR; c.) [facility] 04/20/2022: mRA 8, mPA 27, mPCWP 20, LVEDP 13, CO 4.97, CI 2.34   Aortic atherosclerosis (HCC)    Aortic root dilatation (HCC)    a.) TTE 05/18/2020: 42 mm; b.) TTE 04/19/2022: 40 mm   Atypical chest pain    a. 03/2012 St echo: nl EF, no wma's.   BPH with obstruction/lower urinary tract symptoms    CAD (coronary artery disease)    a.) R/LHC 04/20/2022: 100% RPDA, 40% D1, 30% m-dLAD - med mgmt   Calculus in bladder    Chronic prostatitis    Depression    Elevated PSA    Essential hypertension    History of kidney stones    Hyperlipidemia    Infrarenal abdominal aortic aneurysm (AAA) without rupture (HCC)    a.) CT abd/pelvis 04/18/2022: saccular infrarenal aneurysm measuring up to 5.1 cm   Long term current use of anticoagulant    a.) rivaroxaban   MVP (mitral valve prolapse)    NICM (nonischemic cardiomyopathy) (HCC)    a.) MPI 11/06/2014: EF 30-45%; b.) TTE 05/18/2020: EF 45%; c.) MPI 08/17/2020: EF 25%; d.) TTE 04/19/2022: EF 30-35%   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   a.) MRI brain 07/03/2008 - tiny nonhemorrhagic infarct LEFT posteral parietal lobe    Persistent atrial fibrillation (HCC)    a.) CHA2DS2VASc = 5 (age x2, HFrEF, HTN, vascular disease history);  b.) rate/rhythm maintained on oral digoxin + metoprolol succinate; chronically anticoagulated with rivaroxaban   Psoriasis    Reflux    Sleep apnea    a.) does not utilize nocturnal PAP therapy   Vitamin B 12 deficiency     Procedure(s): ENDOVASCULAR REPAIR/STENT GRAFT  Discharged Condition: good  HPI:  Patient is POD from Endovascular AAA repair. He is recovering as expected. Bilateral groins covered with dressing which is clean dry and intact. No hematoma, seroma or infection to note. Patient denies any chest pain or shortness of breath. Denies any neuro focal difficulties, dizzyness or vertigo. Denies any nausea or vomiting  Patient to discharge home.   Hospital Course:  Gary Dawson is a 82 y.o. male is S/P Bilateral groin incisions for Endovascular AAA repair. Recovering as expected. No complications to note.  Extubated: POD # 0  Physical Exam:  Alert notes x3, no acute distress Face: Symmetrical.  Tongue is midline. Neck: Trachea is midline.  No swelling or bruising. Cardiovascular: Regular rate and rhythm Pulmonary: Clear to auscultation bilaterally Abdomen: Soft, nontender, nondistended Right groin access: Clean dry and intact.  No swelling or drainage noted Left groin access: Clean dry and intact.  No swelling or drainage noted Left lower extremity: Thigh soft.  Calf soft.  Extremities warm distally toes.  Hard to palpate pedal pulses however the foot is warm is her good capillary refill. Right lower extremity: Thigh soft.  Calf soft.  Extremities warm distally toes.  Hard to palpate pedal pulses however the foot is warm is her good capillary refill. Neurological: No deficits noted   Post-op wounds:  clean, dry, intact or healing well  Pt. Ambulating, voiding and taking PO diet without difficulty. Pt pain controlled with PO pain meds.  Labs:  As  below  Complications: none  Consults:    Significant Diagnostic Studies: CBC Lab Results  Component Value Date   WBC 12.0 (H) 05/19/2022   HGB 11.7 (L) 05/19/2022   HCT 34.1 (L) 05/19/2022   MCV 98.6 05/19/2022   PLT 141 (L) 05/19/2022    BMET    Component Value Date/Time   NA 138 05/19/2022 0405   NA 139 05/17/2020 1455   K 4.0 05/19/2022 0405   CL 108 05/19/2022 0405   CO2 24 05/19/2022 0405   GLUCOSE 129 (H) 05/19/2022 0405   BUN 18 05/19/2022 0405   BUN 27 05/17/2020 1455   CREATININE 0.84 05/19/2022 0405   CALCIUM 8.4 (L) 05/19/2022 0405   GFRNONAA >60 05/19/2022 0405   GFRAA >60 09/19/2018 1453   COAG Lab Results  Component Value Date   INR 2.9 (H) 04/19/2022     Disposition:  Discharge to :Home  Allergies as of 05/19/2022       Reactions   Other Other (See Comments)   Trees and grasses        Medication List     TAKE these medications    aspirin EC 81 MG tablet Take 1 tablet (81 mg total) by mouth daily at 6 (six) AM. Swallow whole. Start taking on: May 20, 2022   atorvastatin 40 MG tablet Commonly known as: LIPITOR Take 1 tablet (40 mg total) by mouth daily. What changed: when to take this   baclofen 10 MG tablet Commonly known as: LIORESAL Take 10 mg by mouth at bedtime.   Biotin [address] MCG Tabs Take 1 tablet by mouth daily at 6 (six) AM.   dapagliflozin propanediol 10 MG Tabs tablet Commonly known as: FARXIGA Take 1 tablet (10 mg total) by mouth daily. What changed: when to take this   digoxin 0.25 MG tablet Commonly known as: LANOXIN Take 1 tablet (0.25 mg total) by mouth daily. What changed: when to take this   hydroxypropyl methylcellulose / hypromellose 2.5 % ophthalmic solution Commonly known as: ISOPTO TEARS / GONIOVISC Place 1 drop into both eyes 3 (three) times daily.   losartan 25 MG tablet Commonly known as: COZAAR Take 1 tablet (25 mg total) by mouth daily. What changed: when to take this   metoprolol  succinate 50 MG 24 hr tablet Commonly known as: TOPROL-XL Take 1 tablet (50 mg total) by mouth 2 (two) times daily. Take with or immediately following a meal.   rivaroxaban 20 MG Tabs tablet Commonly known as: Xarelto TAKE 1 TABLET EVERY DAY WITH SUPPER. He can proceed with AAA intervention at the discretion of vascular surgery.  Rivaroxaban should be held for 2 days before the procedure and restarted as soon as it is safe to do so. What changed:  how much to take when to take this   traMADol 50 MG tablet Commonly known as: ULTRAM Take 50 mg by mouth every 6 (six) hours as needed.   VITAMIN B-12 PO Take 1 tablet by mouth daily at 6 (six) AM.  Verbal and written Discharge instructions given to the patient. Wound care per Discharge AVS  Follow-up Information     Dew, Marlow Baars, MD Follow up in 1 month(s).   Specialties: Vascular Surgery, Radiology, Interventional Cardiology Why: EVAR Contact information: [address] [place] [address] [phone]                 Signed: Marcie Bal, NP  05/19/2022, 12:00 PM

## 2022-05-19 NOTE — TOC Transition Note (Addendum)
Transition of Care [facility]) - CM/SW Discharge Note   Patient Details  Name: Gary Dawson MRN: [id number] Date of Birth: [birth year]  [facility] CM/SW Contact:  Darleene Cleaver, LCSW Phone Number: 05/19/2022, 7:58 PM   Clinical Narrative:    CSW received consult that patient will need [facility] services.  CSW spoke to patient and his wife, he is currently open to [facility] and patient is pleased with the care he is receiving.  Patient and wife requested a HH RN to be added to services.  CSW asked attending physician, he added to orders.  Patient will be going home with home health through [facility] PT and RN.  TOC signing off please reconsult with any other TOC needs, home health agency has been notified of planned discharge.  CSW asked if patient had any equipment needs and they said no.    Final next level of care: Home w Home Health Services Barriers to Discharge: Barriers Resolved   Patient Goals and CMS Choice CMS Medicare.gov Compare Post Acute Care list provided to:: Patient Represenative (must comment) Choice offered to / list presented to : Spouse  Discharge Placement                         Discharge Plan and Services Additional resources added to the After Visit Summary for                            HH Arranged: RN, PT [facility] Agency: [facility] Date [facility] Agency Contacted: 05/19/22 Time HH Agency Contacted: 1230 Representative spoke with at [facility] Agency: Elnita Maxwell  Social Determinants of Health (SDOH) Interventions SDOH Screenings   Food Insecurity: No Food Insecurity (05/18/2022)  Housing: Low Risk  (05/18/2022)  Transportation Needs: No Transportation Needs (05/18/2022)  Utilities: Not At Risk (05/18/2022)  Tobacco Use: Low Risk  (05/19/2022)     Readmission Risk Interventions     No data to display

## 2022-05-19 NOTE — Discharge Instructions (Signed)
You may shower tomorrow.  Remove both groin dressings after showering. Pat very dry and place band Aides on sites for the next 3 days.  No heavy lifting, nothing more than a gallon of Milk until follow up  No Driving for 2 weeks.   Follow up as scheduled with Vein and Vascular in the office.

## 2022-05-20 ENCOUNTER — Emergency Department: Payer: Medicare HMO

## 2022-05-20 ENCOUNTER — Inpatient Hospital Stay
Admission: EM | Admit: 2022-05-20 | Discharge: 2022-05-24 | DRG: 057 | Disposition: A | Discharge status: Home Health | Payer: Medicare HMO | Attending: Internal Medicine | Admitting: Internal Medicine

## 2022-05-20 DIAGNOSIS — I2510 Atherosclerotic heart disease of native coronary artery without angina pectoris: Secondary | ICD-10-CM | POA: Diagnosis present

## 2022-05-20 DIAGNOSIS — F05 Delirium due to known physiological condition: Secondary | ICD-10-CM | POA: Diagnosis present

## 2022-05-20 DIAGNOSIS — F02811 Dementia in other diseases classified elsewhere, unspecified severity, with agitation: Secondary | ICD-10-CM | POA: Diagnosis present

## 2022-05-20 DIAGNOSIS — F03918 Unspecified dementia, unspecified severity, with other behavioral disturbance: Principal | ICD-10-CM | POA: Insufficient documentation

## 2022-05-20 DIAGNOSIS — Z79899 Other long term (current) drug therapy: Secondary | ICD-10-CM

## 2022-05-20 DIAGNOSIS — F32A Depression, unspecified: Secondary | ICD-10-CM | POA: Diagnosis present

## 2022-05-20 DIAGNOSIS — Z66 Do not resuscitate: Secondary | ICD-10-CM | POA: Diagnosis not present

## 2022-05-20 DIAGNOSIS — E7800 Pure hypercholesterolemia, unspecified: Secondary | ICD-10-CM | POA: Diagnosis present

## 2022-05-20 DIAGNOSIS — Z7901 Long term (current) use of anticoagulants: Secondary | ICD-10-CM

## 2022-05-20 DIAGNOSIS — L409 Psoriasis, unspecified: Secondary | ICD-10-CM | POA: Diagnosis present

## 2022-05-20 DIAGNOSIS — I110 Hypertensive heart disease with heart failure: Secondary | ICD-10-CM | POA: Diagnosis present

## 2022-05-20 DIAGNOSIS — Z8673 Personal history of transient ischemic attack (TIA), and cerebral infarction without residual deficits: Secondary | ICD-10-CM

## 2022-05-20 DIAGNOSIS — F0152 Vascular dementia, unspecified severity, with psychotic disturbance: Secondary | ICD-10-CM | POA: Diagnosis present

## 2022-05-20 DIAGNOSIS — I10 Essential (primary) hypertension: Secondary | ICD-10-CM | POA: Diagnosis present

## 2022-05-20 DIAGNOSIS — R0902 Hypoxemia: Secondary | ICD-10-CM | POA: Diagnosis not present

## 2022-05-20 DIAGNOSIS — F0282 Dementia in other diseases classified elsewhere, unspecified severity, with psychotic disturbance: Secondary | ICD-10-CM | POA: Diagnosis present

## 2022-05-20 DIAGNOSIS — R410 Disorientation, unspecified: Secondary | ICD-10-CM | POA: Diagnosis not present

## 2022-05-20 DIAGNOSIS — R3129 Other microscopic hematuria: Secondary | ICD-10-CM | POA: Diagnosis present

## 2022-05-20 DIAGNOSIS — I4821 Permanent atrial fibrillation: Secondary | ICD-10-CM | POA: Diagnosis present

## 2022-05-20 DIAGNOSIS — Z83438 Family history of other disorder of lipoprotein metabolism and other lipidemia: Secondary | ICD-10-CM

## 2022-05-20 DIAGNOSIS — R404 Transient alteration of awareness: Secondary | ICD-10-CM | POA: Diagnosis not present

## 2022-05-20 DIAGNOSIS — G309 Alzheimer's disease, unspecified: Secondary | ICD-10-CM | POA: Diagnosis not present

## 2022-05-20 DIAGNOSIS — Z87442 Personal history of urinary calculi: Secondary | ICD-10-CM

## 2022-05-20 DIAGNOSIS — F01511 Vascular dementia, unspecified severity, with agitation: Secondary | ICD-10-CM | POA: Diagnosis present

## 2022-05-20 DIAGNOSIS — F02818 Dementia in other diseases classified elsewhere, unspecified severity, with other behavioral disturbance: Secondary | ICD-10-CM | POA: Diagnosis present

## 2022-05-20 DIAGNOSIS — G4730 Sleep apnea, unspecified: Secondary | ICD-10-CM | POA: Diagnosis present

## 2022-05-20 DIAGNOSIS — I4891 Unspecified atrial fibrillation: Secondary | ICD-10-CM | POA: Diagnosis present

## 2022-05-20 DIAGNOSIS — Z743 Need for continuous supervision: Secondary | ICD-10-CM | POA: Diagnosis not present

## 2022-05-20 DIAGNOSIS — I5022 Chronic systolic (congestive) heart failure: Secondary | ICD-10-CM | POA: Diagnosis present

## 2022-05-20 DIAGNOSIS — I714 Abdominal aortic aneurysm, without rupture, unspecified: Secondary | ICD-10-CM | POA: Diagnosis present

## 2022-05-20 DIAGNOSIS — I428 Other cardiomyopathies: Secondary | ICD-10-CM | POA: Diagnosis present

## 2022-05-20 DIAGNOSIS — I341 Nonrheumatic mitral (valve) prolapse: Secondary | ICD-10-CM | POA: Diagnosis present

## 2022-05-20 DIAGNOSIS — N401 Enlarged prostate with lower urinary tract symptoms: Secondary | ICD-10-CM | POA: Diagnosis present

## 2022-05-20 DIAGNOSIS — G4733 Obstructive sleep apnea (adult) (pediatric): Secondary | ICD-10-CM | POA: Diagnosis present

## 2022-05-20 DIAGNOSIS — Z7982 Long term (current) use of aspirin: Secondary | ICD-10-CM

## 2022-05-20 DIAGNOSIS — M6281 Muscle weakness (generalized): Secondary | ICD-10-CM | POA: Insufficient documentation

## 2022-05-20 DIAGNOSIS — Z8249 Family history of ischemic heart disease and other diseases of the circulatory system: Secondary | ICD-10-CM

## 2022-05-20 DIAGNOSIS — R4182 Altered mental status, unspecified: Secondary | ICD-10-CM | POA: Diagnosis not present

## 2022-05-20 DIAGNOSIS — F01518 Vascular dementia, unspecified severity, with other behavioral disturbance: Secondary | ICD-10-CM | POA: Diagnosis present

## 2022-05-20 LAB — COMPREHENSIVE METABOLIC PANEL
ALT: 15 U/L (ref 0–44)
AST: 24 U/L (ref 15–41)
Albumin: 4 g/dL (ref 3.5–5.0)
Alkaline Phosphatase: 64 U/L (ref 38–126)
Anion gap: 6 (ref 5–15)
BUN: 24 mg/dL — ABNORMAL HIGH (ref 8–23)
CO2: 27 mmol/L (ref 22–32)
Calcium: 8.7 mg/dL — ABNORMAL LOW (ref 8.9–10.3)
Chloride: 105 mmol/L (ref 98–111)
Creatinine, Ser: 0.78 mg/dL (ref 0.61–1.24)
GFR, Estimated: >60 mL/min (ref >60)
Glucose, Bld: 101 mg/dL — ABNORMAL HIGH (ref 70–99)
Potassium: 3.6 mmol/L (ref 3.5–5.1)
Sodium: 138 mmol/L (ref 135–145)
Total Bilirubin: 1.6 mg/dL — ABNORMAL HIGH (ref 0.3–1.2)
Total Protein: 8 g/dL (ref 6.5–8.1)

## 2022-05-20 LAB — TROPONIN I (HIGH SENSITIVITY)
Troponin I (High Sensitivity): 21 ng/L — ABNORMAL HIGH (ref <18)
Troponin I (High Sensitivity): 22 ng/L — ABNORMAL HIGH (ref <18)

## 2022-05-20 LAB — CBC WITH DIFFERENTIAL/PLATELET
Abs Immature Granulocytes: 0.03 10*3/uL (ref 0.00–0.07)
Basophils Absolute: 0 10*3/uL (ref 0.0–0.1)
Basophils Relative: 0 %
Eosinophils Absolute: 0 10*3/uL (ref 0.0–0.5)
Eosinophils Relative: 0 %
HCT: 40.8 % (ref 39.0–52.0)
Hemoglobin: 13.6 g/dL (ref 13.0–17.0)
Immature Granulocytes: 0 %
Lymphocytes Relative: 10 %
Lymphs Abs: 1.1 10*3/uL (ref 0.7–4.0)
MCH: 33.6 pg (ref 26.0–34.0)
MCHC: 33.3 g/dL (ref 30.0–36.0)
MCV: 100.7 fL — ABNORMAL HIGH (ref 80.0–100.0)
Monocytes Absolute: 1.5 10*3/uL — ABNORMAL HIGH (ref 0.1–1.0)
Monocytes Relative: 14 %
Neutro Abs: 7.6 10*3/uL (ref 1.7–7.7)
Neutrophils Relative %: 76 %
Platelets: 141 10*3/uL — ABNORMAL LOW (ref 150–400)
RBC: 4.05 MIL/uL — ABNORMAL LOW (ref 4.22–5.81)
RDW: 13.4 % (ref 11.5–15.5)
WBC: 10.2 10*3/uL (ref 4.0–10.5)
nRBC: 0 % (ref 0.0–0.2)

## 2022-05-20 LAB — LACTIC ACID, PLASMA
Lactic Acid, Venous: 1.4 mmol/L (ref 0.5–1.9)
Lactic Acid, Venous: 1.2 mmol/L (ref 0.5–1.9)

## 2022-05-20 MED ORDER — HALOPERIDOL LACTATE 5 MG/ML IJ SOLN
INTRAMUSCULAR | Status: AC
  Administered 2022-05-20: 2.5 mg via INTRAVENOUS
  Filled 2022-05-20: qty 1

## 2022-05-20 MED ORDER — LORAZEPAM 2 MG/ML IJ SOLN
INTRAMUSCULAR | Status: AC
  Administered 2022-05-20: 2 mg via INTRAVENOUS
  Filled 2022-05-20: qty 1

## 2022-05-20 MED ORDER — LORAZEPAM 2 MG/ML IJ SOLN: 2.0000 mg | Freq: Once | INTRAMUSCULAR | Status: AC

## 2022-05-20 MED ORDER — HALOPERIDOL LACTATE 5 MG/ML IJ SOLN: 2.5000 mg | Freq: Once | INTRAMUSCULAR | Status: AC

## 2022-05-20 NOTE — ED Notes (Signed)
RN assumed care of pt, found him in the bed w/ two visitors bedside.  He was alert and somewhat cooperative. Pt knew one of his visitor's names and got the first letter of the other's name correct.  He was not sure why he is here but stated he felt "OK" now.  Visitors indicated that his monitor was going off, which RN confirmed b/c he was not hooked up properly.  Visitor stated that she was just going to turn it off, RN educated on the importance of the monitoring equipment and requested that if there was an issue to call for RN.

## 2022-05-20 NOTE — ED Notes (Signed)
RN called to MRI, Pt was not agreeable to having the MRI completed.  RN found him sitting on the end of the bed w/ the bottom part of his body unclothed.  He was refusing to get back in the bed or cooperate at all w/ staff.  He was not able to identify what he wanted to do.  There were three imaging staff members and two security personal bedside.  Charge RN notified who went and informed provider who prescribed medication which was given.  Pt was assisted back into the bed and brought back to ED room.  Family member was bedside and informed of the situation.

## 2022-05-20 NOTE — ED Provider Notes (Signed)
[facility] Provider Note    Event Date/Time   First MD Initiated Contact with Patient 05/20/22 1844     (approximate)   History   Altered Mental Status   HPI {Remember to add pertinent medical, surgical, social, and/or OB history to HPI:1} Gary Dawson is a 82 y.o. male  ***       Physical Exam   Triage Vital Signs: ED Triage Vitals [05/20/22 1855]  Enc Vitals Group     BP (!) 145/84     Pulse Rate (!) 113     Resp 13     Temp 98.3 F (36.8 C)     Temp Source Oral     SpO2 93 %     Weight      Height      Head Circumference      Peak Flow      Pain Score      Pain Loc      Pain Edu?      Excl. in GC?     Most recent vital signs: Vitals:   05/20/22 1855  BP: (!) 145/84  Pulse: (!) 113  Resp: 13  Temp: 98.3 F (36.8 C)  SpO2: 93%    {Only need to document appropriate and relevant physical exam:1} General: Awake, no distress. *** CV:  Good peripheral perfusion. *** Resp:  Normal effort. *** Abd:  No distention. *** Other:  ***   ED Results / Procedures / Treatments   Labs (all labs ordered are listed, but only abnormal results are displayed) Labs Reviewed  CULTURE, BLOOD (ROUTINE X 2)  CULTURE, BLOOD (ROUTINE X 2)  COMPREHENSIVE METABOLIC PANEL  CBC WITH DIFFERENTIAL/PLATELET  LACTIC ACID, PLASMA  LACTIC ACID, PLASMA  TROPONIN I (HIGH SENSITIVITY)     EKG  ***   RADIOLOGY *** {USE THE WORD "INTERPRETED"!! You MUST document your own interpretation of imaging, as well as the fact that you reviewed the radiologist's report!:1}   PROCEDURES:  Critical Care performed: Yes  CRITICAL CARE Performed by: Phineas Semen   Total critical care time: *** minutes  Critical care time was exclusive of separately billable procedures and treating other patients.  Critical care was necessary to treat or prevent imminent or life-threatening deterioration.  Critical care was time spent personally by me  on the following activities: development of treatment plan with patient and/or surrogate as well as nursing, discussions with consultants, evaluation of patient's response to treatment, examination of patient, obtaining history from patient or surrogate, ordering and performing treatments and interventions, ordering and review of laboratory studies, ordering and review of radiographic studies, pulse oximetry and re-evaluation of patient's condition.   Procedures    MEDICATIONS ORDERED IN ED: Medications - No data to display   IMPRESSION / MDM / ASSESSMENT AND PLAN / ED COURSE  I reviewed the triage vital signs and the nursing notes.                              Differential diagnosis includes, but is not limited to, ***  Patient's presentation is most consistent with {EM COPA:27473}   ***The patient is on the cardiac monitor to evaluate for evidence of arrhythmia and/or significant heart rate changes.  ***      FINAL CLINICAL IMPRESSION(S) / ED DIAGNOSES   Final diagnoses:  None     Rx / DC Orders   ED Discharge Orders  None        Note:  This document was prepared using Dragon voice recognition software and may include unintentional dictation errors.

## 2022-05-20 NOTE — ED Notes (Signed)
To MRI

## 2022-05-20 NOTE — ED Provider Notes (Incomplete)
[facility] Provider Note    Event Date/Time   First MD Initiated Contact with Patient 05/20/22 1844     (approximate)   History   Altered Mental Status   HPI  Zander Ransburg Wheeley is a 82 y.o. male who presents to the emergency department today because of concerns for altered mental status.  Patient started having altered mental status early this morning.  Family has noticed delusions, and abnormal behavior.  Patient was recently in the hospital for AAA repair.  EMS did have to give patient medication to help calm him down.  Patient himself is unable to give any significant history.     Physical Exam   Triage Vital Signs: ED Triage Vitals [05/20/22 1855]  Enc Vitals Group     BP (!) 145/84     Pulse Rate (!) 113     Resp 13     Temp 98.3 F (36.8 C)     Temp Source Oral     SpO2 93 %     Weight      Height      Head Circumference      Peak Flow      Pain Score      Pain Loc      Pain Edu?      Excl. in GC?     Most recent vital signs: Vitals:   05/20/22 1855  BP: (!) 145/84  Pulse: (!) 113  Resp: 13  Temp: 98.3 F (36.8 C)  SpO2: 93%    {Only need to document appropriate and relevant physical exam:1} General: Awake, no distress. *** CV:  Good peripheral perfusion. *** Resp:  Normal effort. *** Abd:  No distention. *** Other:  ***   ED Results / Procedures / Treatments   Labs (all labs ordered are listed, but only abnormal results are displayed) Labs Reviewed  CULTURE, BLOOD (ROUTINE X 2)  CULTURE, BLOOD (ROUTINE X 2)  COMPREHENSIVE METABOLIC PANEL  CBC WITH DIFFERENTIAL/PLATELET  LACTIC ACID, PLASMA  LACTIC ACID, PLASMA  TROPONIN I (HIGH SENSITIVITY)     EKG  ***   RADIOLOGY *** {USE THE WORD "INTERPRETED"!! You MUST document your own interpretation of imaging, as well as the fact that you reviewed the radiologist's report!:1}   PROCEDURES:  Critical Care performed: Yes  CRITICAL CARE Performed by:  Phineas Semen   Total critical care time: *** minutes  Critical care time was exclusive of separately billable procedures and treating other patients.  Critical care was necessary to treat or prevent imminent or life-threatening deterioration.  Critical care was time spent personally by me on the following activities: development of treatment plan with patient and/or surrogate as well as nursing, discussions with consultants, evaluation of patient's response to treatment, examination of patient, obtaining history from patient or surrogate, ordering and performing treatments and interventions, ordering and review of laboratory studies, ordering and review of radiographic studies, pulse oximetry and re-evaluation of patient's condition.   Procedures    MEDICATIONS ORDERED IN ED: Medications - No data to display   IMPRESSION / MDM / ASSESSMENT AND PLAN / ED COURSE  I reviewed the triage vital signs and the nursing notes.                              Differential diagnosis includes, but is not limited to, ***  Patient's presentation is most consistent with {EM COPA:27473}   ***The patient is  on the cardiac monitor to evaluate for evidence of arrhythmia and/or significant heart rate changes.  Patient presented to the emergency department today because of concerns for altered mental status that started earlier this morning.  On exam patient is altered.  No aggression here in the emergency department.  Blood work without concerning leukocytosis.  No significant electrolyte abnormality.  Given acute change in patient's behavior did have concern for CVA.  CT without any obvious abnormalities.  MRI was ordered however when patient did go to MRI he did become agitated and MRI had to be delayed.  He was given medication to help calm down.  Additionally while here he had an episode of hypoxia.  Chest x-ray shows possible edema versus atypical infection.  Given recent hospitalization procedure did  have concern for pulmonary embolism.  Awaiting CT scan at time of signout.     FINAL CLINICAL IMPRESSION(S) / ED DIAGNOSES   Final diagnoses:  None     Rx / DC Orders   ED Discharge Orders     None        Note:  This document was prepared using Dragon voice recognition software and may include unintentional dictation errors.

## 2022-05-20 NOTE — ED Notes (Signed)
Pt in bed attempting to get out of bed.  The bed was positioned so that it was more difficult to get up.  RN was unable to leave room b/c he needed constant redirection not to get out of the bed.  Provider bedside who prescribed medication.  CN obtained the medication which was administered by the RN.

## 2022-05-20 NOTE — ED Notes (Signed)
Provider bedside.

## 2022-05-20 NOTE — ED Triage Notes (Signed)
Pt to ED via EMS from home with sudden onset of AMS @ 1600 today with delusions and aggression. Pt was discharged from hospital 2 days ago following a AAA without rupture. Pt was given 5 mg Versed IM and 2.5 mg Haldol IM by EMS @ 1820. Pt has a hx of CHF and Afib.  CBG 124

## 2022-05-20 NOTE — ED Notes (Signed)
Assumed care of pt, he is currently at CT

## 2022-05-21 ENCOUNTER — Encounter: Payer: Self-pay | Admitting: Internal Medicine

## 2022-05-21 ENCOUNTER — Emergency Department: Payer: Medicare HMO

## 2022-05-21 ENCOUNTER — Other Ambulatory Visit: Payer: Self-pay

## 2022-05-21 ENCOUNTER — Inpatient Hospital Stay: Payer: Medicare HMO

## 2022-05-21 DIAGNOSIS — E7800 Pure hypercholesterolemia, unspecified: Secondary | ICD-10-CM | POA: Diagnosis not present

## 2022-05-21 DIAGNOSIS — G4733 Obstructive sleep apnea (adult) (pediatric): Secondary | ICD-10-CM | POA: Diagnosis not present

## 2022-05-21 DIAGNOSIS — I7143 Infrarenal abdominal aortic aneurysm, without rupture: Secondary | ICD-10-CM | POA: Diagnosis not present

## 2022-05-21 DIAGNOSIS — Z8673 Personal history of transient ischemic attack (TIA), and cerebral infarction without residual deficits: Secondary | ICD-10-CM | POA: Diagnosis not present

## 2022-05-21 DIAGNOSIS — G4730 Sleep apnea, unspecified: Secondary | ICD-10-CM

## 2022-05-21 DIAGNOSIS — R410 Disorientation, unspecified: Secondary | ICD-10-CM

## 2022-05-21 DIAGNOSIS — I714 Abdominal aortic aneurysm, without rupture, unspecified: Secondary | ICD-10-CM

## 2022-05-21 DIAGNOSIS — Z7982 Long term (current) use of aspirin: Secondary | ICD-10-CM | POA: Diagnosis not present

## 2022-05-21 DIAGNOSIS — R0902 Hypoxemia: Secondary | ICD-10-CM | POA: Diagnosis not present

## 2022-05-21 DIAGNOSIS — F02818 Dementia in other diseases classified elsewhere, unspecified severity, with other behavioral disturbance: Secondary | ICD-10-CM | POA: Diagnosis not present

## 2022-05-21 DIAGNOSIS — R3129 Other microscopic hematuria: Secondary | ICD-10-CM

## 2022-05-21 DIAGNOSIS — F0282 Dementia in other diseases classified elsewhere, unspecified severity, with psychotic disturbance: Secondary | ICD-10-CM | POA: Diagnosis not present

## 2022-05-21 DIAGNOSIS — I110 Hypertensive heart disease with heart failure: Secondary | ICD-10-CM | POA: Diagnosis not present

## 2022-05-21 DIAGNOSIS — R4182 Altered mental status, unspecified: Secondary | ICD-10-CM | POA: Diagnosis not present

## 2022-05-21 DIAGNOSIS — G309 Alzheimer's disease, unspecified: Secondary | ICD-10-CM | POA: Diagnosis not present

## 2022-05-21 DIAGNOSIS — I428 Other cardiomyopathies: Secondary | ICD-10-CM

## 2022-05-21 DIAGNOSIS — I4821 Permanent atrial fibrillation: Secondary | ICD-10-CM | POA: Diagnosis not present

## 2022-05-21 DIAGNOSIS — Z7901 Long term (current) use of anticoagulants: Secondary | ICD-10-CM | POA: Diagnosis not present

## 2022-05-21 DIAGNOSIS — I10 Essential (primary) hypertension: Secondary | ICD-10-CM | POA: Diagnosis not present

## 2022-05-21 DIAGNOSIS — F05 Delirium due to known physiological condition: Secondary | ICD-10-CM | POA: Diagnosis not present

## 2022-05-21 DIAGNOSIS — F32A Depression, unspecified: Secondary | ICD-10-CM | POA: Diagnosis not present

## 2022-05-21 DIAGNOSIS — I341 Nonrheumatic mitral (valve) prolapse: Secondary | ICD-10-CM | POA: Diagnosis not present

## 2022-05-21 DIAGNOSIS — R531 Weakness: Secondary | ICD-10-CM | POA: Diagnosis not present

## 2022-05-21 DIAGNOSIS — N401 Enlarged prostate with lower urinary tract symptoms: Secondary | ICD-10-CM | POA: Diagnosis not present

## 2022-05-21 DIAGNOSIS — F01518 Vascular dementia, unspecified severity, with other behavioral disturbance: Secondary | ICD-10-CM | POA: Diagnosis not present

## 2022-05-21 DIAGNOSIS — I5022 Chronic systolic (congestive) heart failure: Secondary | ICD-10-CM | POA: Diagnosis not present

## 2022-05-21 DIAGNOSIS — F02811 Dementia in other diseases classified elsewhere, unspecified severity, with agitation: Secondary | ICD-10-CM | POA: Diagnosis not present

## 2022-05-21 DIAGNOSIS — I4891 Unspecified atrial fibrillation: Secondary | ICD-10-CM

## 2022-05-21 DIAGNOSIS — R0683 Snoring: Secondary | ICD-10-CM | POA: Diagnosis not present

## 2022-05-21 DIAGNOSIS — I2510 Atherosclerotic heart disease of native coronary artery without angina pectoris: Secondary | ICD-10-CM | POA: Diagnosis not present

## 2022-05-21 DIAGNOSIS — Z136 Encounter for screening for cardiovascular disorders: Secondary | ICD-10-CM | POA: Diagnosis not present

## 2022-05-21 DIAGNOSIS — Z8249 Family history of ischemic heart disease and other diseases of the circulatory system: Secondary | ICD-10-CM | POA: Diagnosis not present

## 2022-05-21 DIAGNOSIS — F01511 Vascular dementia, unspecified severity, with agitation: Secondary | ICD-10-CM | POA: Diagnosis not present

## 2022-05-21 DIAGNOSIS — F03918 Unspecified dementia, unspecified severity, with other behavioral disturbance: Secondary | ICD-10-CM | POA: Diagnosis not present

## 2022-05-21 DIAGNOSIS — Z66 Do not resuscitate: Secondary | ICD-10-CM | POA: Diagnosis not present

## 2022-05-21 DIAGNOSIS — F0152 Vascular dementia, unspecified severity, with psychotic disturbance: Secondary | ICD-10-CM | POA: Diagnosis not present

## 2022-05-21 LAB — HEPATIC FUNCTION PANEL
ALT: 16 U/L (ref 0–44)
AST: 35 U/L (ref 15–41)
Albumin: 3.6 g/dL (ref 3.5–5.0)
Alkaline Phosphatase: 62 U/L (ref 38–126)
Bilirubin, Direct: 0.4 mg/dL — ABNORMAL HIGH (ref 0.0–0.2)
Indirect Bilirubin: 1.6 mg/dL — ABNORMAL HIGH (ref 0.3–0.9)
Total Bilirubin: 2 mg/dL — ABNORMAL HIGH (ref 0.3–1.2)
Total Protein: 7.2 g/dL (ref 6.5–8.1)

## 2022-05-21 LAB — TYPE AND SCREEN
ABO/RH(D): O POS
Antibody Screen: NEGATIVE

## 2022-05-21 LAB — URINALYSIS, ROUTINE W REFLEX MICROSCOPIC
Bacteria, UA: NONE SEEN
Bilirubin Urine: NEGATIVE
Glucose, UA: 150 mg/dL — AB
Ketones, ur: 5 mg/dL — AB
Leukocytes,Ua: NEGATIVE
Nitrite: NEGATIVE
Protein, ur: 30 mg/dL — AB
RBC / HPF: >50 RBC/hpf (ref 0–5)
Specific Gravity, Urine: >1.046 — ABNORMAL HIGH (ref 1.005–1.030)
pH: 5 (ref 5.0–8.0)

## 2022-05-21 LAB — BRAIN NATRIURETIC PEPTIDE: B Natriuretic Peptide: 318.1 pg/mL — ABNORMAL HIGH (ref 0.0–100.0)

## 2022-05-21 LAB — PROTIME-INR
INR: 1.4 — ABNORMAL HIGH (ref 0.8–1.2)
Prothrombin Time: 16.7 seconds — ABNORMAL HIGH (ref 11.4–15.2)

## 2022-05-21 LAB — BLOOD GAS, VENOUS
Acid-Base Excess: 3 mmol/L — ABNORMAL HIGH (ref 0.0–2.0)
Bicarbonate: 28.5 mmol/L — ABNORMAL HIGH (ref 20.0–28.0)
pH, Ven: 7.4 (ref 7.25–7.43)
pO2, Ven: 39 mmHg (ref 32–45)

## 2022-05-21 LAB — CBC
HCT: 36.4 % — ABNORMAL LOW (ref 39.0–52.0)
Hemoglobin: 12.2 g/dL — ABNORMAL LOW (ref 13.0–17.0)
MCH: 33.1 pg (ref 26.0–34.0)
MCHC: 33.5 g/dL (ref 30.0–36.0)
MCV: 98.6 fL (ref 80.0–100.0)
Platelets: 146 10*3/uL — ABNORMAL LOW (ref 150–400)
RBC: 3.69 MIL/uL — ABNORMAL LOW (ref 4.22–5.81)
RDW: 13.3 % (ref 11.5–15.5)
WBC: 8.9 10*3/uL (ref 4.0–10.5)
nRBC: 0 % (ref 0.0–0.2)

## 2022-05-21 LAB — APTT: aPTT: 60 seconds — ABNORMAL HIGH (ref 24–36)

## 2022-05-21 LAB — CULTURE, BLOOD (ROUTINE X 2): Culture: NO GROWTH

## 2022-05-21 LAB — SEDIMENTATION RATE: Sed Rate: 54 mm/hr — ABNORMAL HIGH (ref 0–20)

## 2022-05-21 LAB — AMMONIA: Ammonia: 11 umol/L (ref 9–35)

## 2022-05-21 LAB — LACTIC ACID, PLASMA
Lactic Acid, Venous: 1.9 mmol/L (ref 0.5–1.9)
Lactic Acid, Venous: 1.5 mmol/L (ref 0.5–1.9)

## 2022-05-21 LAB — HEPARIN LEVEL (UNFRACTIONATED): Heparin Unfractionated: 0.39 IU/mL (ref 0.30–0.70)

## 2022-05-21 LAB — CK: Total CK: 229 U/L (ref 49–397)

## 2022-05-21 LAB — PROCALCITONIN: Procalcitonin: <0.1 ng/mL

## 2022-05-21 MED ORDER — IOHEXOL 350 MG/ML SOLN
100.0000 mL | Freq: Once | INTRAVENOUS | Status: AC | PRN
  Administered 2022-05-21: 100 mL via INTRAVENOUS

## 2022-05-21 MED ORDER — LACTATED RINGERS IV SOLN: INTRAVENOUS | Status: DC

## 2022-05-21 MED ORDER — ATORVASTATIN CALCIUM 20 MG PO TABS
40.0000 mg | ORAL_TABLET | Freq: Every day | ORAL | Status: DC
  Administered 2022-05-21 – 2022-05-23 (×3): 40 mg via ORAL
  Filled 2022-05-21 (×3): qty 2

## 2022-05-21 MED ORDER — DAPAGLIFLOZIN PROPANEDIOL 10 MG PO TABS
10.0000 mg | ORAL_TABLET | Freq: Every day | ORAL | Status: DC
  Administered 2022-05-21 – 2022-05-24 (×4): 10 mg via ORAL
  Filled 2022-05-21 (×5): qty 1

## 2022-05-21 MED ORDER — DILTIAZEM HCL ER COATED BEADS 120 MG PO CP24
120.0000 mg | ORAL_CAPSULE | Freq: Every day | ORAL | Status: DC
  Administered 2022-05-21 – 2022-05-24 (×4): 120 mg via ORAL
  Filled 2022-05-21 (×5): qty 1

## 2022-05-21 MED ORDER — METOPROLOL SUCCINATE ER 50 MG PO TB24
50.0000 mg | ORAL_TABLET | Freq: Two times a day (BID) | ORAL | Status: DC
  Administered 2022-05-21 – 2022-05-24 (×7): 50 mg via ORAL
  Filled 2022-05-21 (×7): qty 1

## 2022-05-21 MED ORDER — HALOPERIDOL LACTATE 5 MG/ML IJ SOLN
INTRAMUSCULAR | Status: AC
  Administered 2022-05-21: 2 mg via INTRAMUSCULAR
  Filled 2022-05-21: qty 1

## 2022-05-21 MED ORDER — OXYBUTYNIN CHLORIDE ER 5 MG PO TB24
5.0000 mg | ORAL_TABLET | Freq: Every day | ORAL | Status: DC
  Administered 2022-05-21 – 2022-05-23 (×3): 5 mg via ORAL
  Filled 2022-05-21 (×3): qty 1

## 2022-05-21 MED ORDER — LORAZEPAM 2 MG/ML IJ SOLN: 1.0000 mg | Freq: Once | INTRAMUSCULAR | Status: AC

## 2022-05-21 MED ORDER — LORAZEPAM 2 MG/ML IJ SOLN
INTRAMUSCULAR | Status: AC
  Administered 2022-05-21: 1 mg via INTRAVENOUS
  Filled 2022-05-21: qty 1

## 2022-05-21 MED ORDER — LORAZEPAM 2 MG/ML IJ SOLN
0.5000 mg | Freq: Once | INTRAMUSCULAR | Status: AC
  Administered 2022-05-21: 0.5 mg via INTRAVENOUS

## 2022-05-21 MED ORDER — HALOPERIDOL LACTATE 5 MG/ML IJ SOLN: 2.0000 mg | Freq: Once | INTRAMUSCULAR | Status: AC

## 2022-05-21 MED ORDER — SODIUM CHLORIDE 0.9% FLUSH
3.0000 mL | Freq: Two times a day (BID) | INTRAVENOUS | Status: DC
  Administered 2022-05-21 – 2022-05-23 (×4): 3 mL via INTRAVENOUS

## 2022-05-21 MED ORDER — LORAZEPAM 2 MG/ML IJ SOLN
0.5000 mg | Freq: Once | INTRAMUSCULAR | Status: DC
  Filled 2022-05-21: qty 1

## 2022-05-21 MED ORDER — DIGOXIN 250 MCG PO TABS
0.2500 mg | ORAL_TABLET | ORAL | Status: DC
  Administered 2022-05-21 – 2022-05-24 (×4): 0.25 mg via ORAL
  Filled 2022-05-21 (×5): qty 1

## 2022-05-21 MED ORDER — DILTIAZEM HCL-DEXTROSE 125-5 MG/125ML-% IV SOLN (PREMIX): 2.5000 mg/h | INTRAVENOUS | Status: DC

## 2022-05-21 MED ORDER — ACETAMINOPHEN 325 MG PO TABS: 650.0000 mg | ORAL_TABLET | Freq: Four times a day (QID) | ORAL | Status: DC | PRN

## 2022-05-21 MED ORDER — METOPROLOL TARTRATE 5 MG/5ML IV SOLN
2.5000 mg | Freq: Once | INTRAVENOUS | Status: DC
  Filled 2022-05-21: qty 5

## 2022-05-21 MED ORDER — ACETAMINOPHEN 650 MG RE SUPP: 650.0000 mg | Freq: Four times a day (QID) | RECTAL | Status: DC | PRN

## 2022-05-21 MED ORDER — RIVAROXABAN 20 MG PO TABS
20.0000 mg | ORAL_TABLET | Freq: Every day | ORAL | Status: DC
  Administered 2022-05-21 – 2022-05-23 (×3): 20 mg via ORAL
  Filled 2022-05-21 (×4): qty 1

## 2022-05-21 MED ORDER — HEPARIN (PORCINE) 25000 UT/250ML-% IV SOLN
1200.0000 [IU]/h | INTRAVENOUS | Status: DC
  Administered 2022-05-21: 1200 [IU]/h via INTRAVENOUS
  Filled 2022-05-21: qty 250

## 2022-05-21 MED ORDER — MORPHINE SULFATE (PF) 2 MG/ML IV SOLN
1.0000 mg | Freq: Once | INTRAVENOUS | Status: AC
  Administered 2022-05-21: 1 mg via INTRAVENOUS
  Filled 2022-05-21: qty 1

## 2022-05-21 NOTE — ED Notes (Signed)
Advised nurse that patient has ready bed 

## 2022-05-21 NOTE — ED Provider Notes (Signed)
Vitals:   05/21/22 0000 05/21/22 0100  BP: 128/88 (!) 140/88  Pulse: (!) 115 80  Resp: 11   Temp:    SpO2: 98% 96%    CT Angio Chest PE W and/or Wo Contrast  Result Date: 05/21/2022 CLINICAL DATA:  Hypoxia EXAM: CT ANGIOGRAPHY CHEST WITH CONTRAST TECHNIQUE: Multidetector CT imaging of the chest was performed using the standard protocol during bolus administration of intravenous contrast. Multiplanar CT image reconstructions and MIPs were obtained to evaluate the vascular anatomy. RADIATION DOSE REDUCTION: This exam was performed according to the departmental dose-optimization program which includes automated exposure control, adjustment of the mA and/or kV according to patient size and/or use of iterative reconstruction technique. CONTRAST:  OMNIPAQUE IOHEXOL 350 MG/ML SOLN COMPARISON:  None Available. FINDINGS: Cardiovascular: No filling defects in the pulmonary arteries to suggest pulmonary emboli. Heart is mildly enlarged. Diffuse coronary artery and aortic atherosclerosis. No aneurysm. Mediastinum/Nodes: No mediastinal, hilar, or axillary adenopathy. Trachea and esophagus are unremarkable. Thyroid unremarkable. Lungs/Pleura: No confluent opacities or effusions. Upper Abdomen: No acute findings Musculoskeletal: Chest wall soft tissues are unremarkable. No acute bony abnormality. Review of the MIP images confirms the above findings. IMPRESSION: No evidence of pulmonary embolus. Cardiomegaly, coronary artery disease. No acute cardiopulmonary disease. Aortic Atherosclerosis (ICD10-I70.0). Electronically Signed   By: Charlett Nose M.D.   On: 05/21/2022 00:49    ----------------------------------------- 3:08 AM on 05/21/2022 -----------------------------------------  Patient still confused, has attempted to stand up.  Was at the end of the bed trying to stand when nurse and I noted.  We had to help him back into bed, he reports that he is leaving.  He does not know where he is though.  He is  confused.  I have also consulted with our hospitalist, Dr. Allena Katz, and she is excepted the patient admission.  He was treated with Ativan and Haldol earlier for needs of agitation and calming.  At this point additional Ativan and Haldol has been ordered.  Continue to monitor closely.  One-to-one sitter has been requested, though availability may be challenging   Sharyn Creamer, MD 05/21/22 [phone]

## 2022-05-21 NOTE — Progress Notes (Signed)
SLP Cancellation Note  Patient Details Name: Mercedes Goodlow MRN: [id number] DOB: 1940-12-13   Cancelled treatment:       Reason Eval/Treat Not Completed: Medical issues which prohibited therapy;Patient at procedure or test/unavailable (Pt OTF for MRI.)  Clyde Canterbury, M.S., CCC-SLP Speech-Language Pathologist [facility] [phone] Arnette Felts)  Woodroe Chen 05/21/2022, 1:43 PM

## 2022-05-21 NOTE — Assessment & Plan Note (Signed)
Vitals:   05/20/22 1930 05/20/22 2000 05/20/22 2030 05/20/22 2100  BP: (!) 157/100 (!) 147/91 (!) 156/82 (!) 147/80   05/20/22 2130 05/20/22 2245 05/20/22 2300 05/21/22 0000  BP: (!) 140/78 119/66 133/77 128/88   05/21/22 0100 05/21/22 0200 05/21/22 0230 05/21/22 0330  BP: (!) 140/88 (!) 112/94 122/66 (!) 172/74  Pressures have been intermittently elevated secondary to agitation and restlessness. Home regimen of p.o. meds is currently held. Patient will be managed on IV diltiazem regimen.

## 2022-05-21 NOTE — Progress Notes (Signed)
ANTICOAGULATION CONSULT NOTE  Pharmacy Consult for heparin infusion Indication: atrial fibrillation  Allergies  Allergen Reactions   Other Other (See Comments)    Trees and grasses     Patient Measurements:   Heparin Dosing Weight: 84.4 kg  Vital Signs: Temp: 98.3 F (36.8 C) (05/04 1855) Temp Source: Oral (05/04 1855) BP: 99/65 (05/05 0404) Pulse Rate: 64 (05/05 0422)  Labs: Recent Labs    05/19/22 0405 05/20/22 1850 05/20/22 2143 05/21/22 0329  HGB 11.7* 13.6  --   --   HCT 34.1* 40.8  --   --   PLT 141* 141*  --   --   CREATININE 0.84 0.78  --   --   CKTOTAL  --   --   --  229  TROPONINIHS  --  21* 22*  --     Estimated Creatinine Clearance: 83.2 mL/min (by C-G formula based on SCr of 0.78 mg/dL).   Medical History: Past Medical History:  Diagnosis Date   Acute on chronic HFrEF (heart failure with reduced ejection fraction) (HCC)    a.) TTE 05/18/2020: EF 45%, glob HK, mild LVH, mod reduced RVSF, mild RVE, mod LAE, sev RAE, mod MAC, triv MR, sev TR, mild-mod AoV sclerosis, (+) IAS (L-R); b.) TTE 04/19/2022: EF 30-35%, glob HK, LVH, mod RVE, PASP 40.3, mod TR, mild AR; c.) [facility] 04/20/2022: mRA 8, mPA 27, mPCWP 20, LVEDP 13, CO 4.97, CI 2.34   Aortic atherosclerosis (HCC)    Aortic root dilatation (HCC)    a.) TTE 05/18/2020: 42 mm; b.) TTE 04/19/2022: 40 mm   Atypical chest pain    a. 03/2012 St echo: nl EF, no wma's.   BPH with obstruction/lower urinary tract symptoms    CAD (coronary artery disease)    a.) R/LHC 04/20/2022: 100% RPDA, 40% D1, 30% m-dLAD - med mgmt   Calculus in bladder    Chronic prostatitis    Depression    Elevated PSA    Essential hypertension    History of kidney stones    Hyperlipidemia    Infrarenal abdominal aortic aneurysm (AAA) without rupture (HCC)    a.) CT abd/pelvis 04/18/2022: saccular infrarenal aneurysm measuring up to 5.1 cm   Long term current use of anticoagulant    a.) rivaroxaban   MVP (mitral valve prolapse)     NICM (nonischemic cardiomyopathy) (HCC)    a.) MPI 11/06/2014: EF 30-45%; b.) TTE 05/18/2020: EF 45%; c.) MPI 08/17/2020: EF 25%; d.) TTE 04/19/2022: EF 30-35%   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   a.) MRI brain 07/03/2008 - tiny nonhemorrhagic infarct LEFT posteral parietal lobe   Persistent atrial fibrillation (HCC)    a.) CHA2DS2VASc = 5 (age x2, HFrEF, HTN, vascular disease history);  b.) rate/rhythm maintained on oral digoxin + metoprolol succinate; chronically anticoagulated with rivaroxaban   Psoriasis    Reflux    Sleep apnea    a.) does not utilize nocturnal PAP therapy   Vitamin B 12 deficiency     Medications:  PTA Meds: Xarelto 20 mg, last dose 5/4, time unknown  Assessment: Pt is a 82 yo male with hx of A fib on Xarelto presenting to ED with AMS, not unable to take PO med d/t requiring 2 point restraint.  Goal of Therapy:  Heparin level 0.3-0.7 units/ml aPTT 66-102 seconds Monitor platelets by anticoagulation protocol: Yes   Plan:  No initial bolus Start heparin infusion at 1200 units/hr Will follow aPTT until correlation w/ HL confirmed Will check  aPTT in 8 hr after start of infusion HL & CBC daily while on heparin  Otelia Sergeant, PharmD, [facility] 05/21/2022 4:29 AM

## 2022-05-21 NOTE — Assessment & Plan Note (Signed)
Status post endovascular repair. Vascular consult per a.m. team. Not sure if patient has an embolic episode or TIA postprocedure. Will consider CT angio of the abdomen and pelvis as deemed appropriate.

## 2022-05-21 NOTE — Hospital Course (Addendum)
Gary Dawson is a 82 y.o. 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 To ED via EMS from home with sudden onset of AMS @ 1600 05/04 with delusions and aggression. Pt was discharged from hospital 2 days prior following AAA repair (Drs Wyn Quaker and [employer]).  05/04:  Pt was given 5 mg Versed IM and 2.5 mg Haldol IM by EMS @ 1820. Difficulty w/ workup/imaging d/t confusion. CTA chest no concerns.  CT head no concerns, does show chronic small-vessel ischemic disease and unchanged ventriculomegaly. Labs no concerns, no apparent infection.  05/05: early AM admitted to hospitalist service for Afib RVR and AMS. Was placed on heparin and diltiazem gtt, these were d/c later int he morning - rate controlled and able to take pills w/ Xarelto. SLP eval. MRI brain non-acute. Long discussion w/ daughter and with his significant other - they describe some paranoid delusional type behavior (accusing neighbor and family of poisoning him, etc), daughter suspects possible cognitive impairment/sundowning type issue, SO notes he has been just like this in the past w/ any anesthesia even or hospitalization.   Consultants:  none  Procedures: none  Gwenette Greet, SO is medical decision maker - Advanced Directive should be in process of being scanned in.      ASSESSMENT & PLAN:   Principal Problem:   Altered mental status, unspecified Active Problems:   Atrial fibrillation with rapid ventricular response (HCC)   Permanent atrial fibrillation (HCC)   NICM (nonischemic cardiomyopathy) (HCC)   History of cardioembolic cerebrovascular accident (CVA)   Benign essential hypertension   Microscopic hematuria   Pure hypercholesterolemia   Sleep apnea in adult   OSA on CPAP   Abdominal aortic aneurysm (AAA) without rupture (HCC)   AAA (abdominal aortic aneurysm) without rupture (HCC)  Altered mental status / Delirium Question embolic complication to brain post-AAA  repair? Per family, had similar issue w/ previous surgery - confusion few days after  Suspect underlying mild/moderate cognitive impairment exacerbated by anesthesia / stressors, possible underlying psychiatric d/o  MRI brain no concerns  Consider EEG w/ neuro consult, doubtful need for LP  NPO except meds/sips pending SLP eval - RN in ED did basic swallow eval, pt needs frequent redirection but seems ok to swallow pills   Atrial fibrillation RVR Now rate controlled  Off diltiazem gtt restarted home meds diltiazem, dignoxin, metoprolol, rivaroxaban  Hx HFrEF, Nonischemic Cardiomyopathy 04/19/22: 2D echo (+)moderate to severe decreased LVEF 30-35% with global hypokinesis- per cardiology note 04/19 felt to be from tachycardia. Prior EF was 45%.  Appears euvolemic at this time Monitor fluid status closely Continue beta blocker, SLGT2, statin holding ACE/ARB d/t low BP Stopped IV fluids  Strict I&O  Recent repair AAA w/ vascular surgery  Imaging / consult if needed  Essential HTN Resume po meds w/ diltiazem, metoprolol Holding losartan d/t softer BP  CAD Recent cardiac cath showed heavily calcified coronary arteries with mild to moderate nonobstructive CAD and occluded proximal right PDA with L>R collateral  No ASA per cardiology - will d/c this from his list  Continue statin and beta blocker   HLD Statin    DVT prophylaxis: Xarelto Pertinent IV fluids/nutrition: stopped LR d/t hx CHF. SLP eval prior to po intake but for now NPO except meds/sips Central lines / invasive devices: none. PurWick is in place will remove when able to use urinal   Code Status: FULL CODE ACP documentation reviewed: 05/05 none on file  in [facility]   Current Admission Status: observation - suspect may need to transitino to inpatient   TOC needs / Dispo plan: TBD pending clinical improvement  Barriers to discharge / significant pending items: medical w/u AMS

## 2022-05-21 NOTE — H&P (Signed)
History and Physical     Patient: Gary Dawson QIO:[id number] DOB: 11-09-1940 DOA: 05/20/2022 DOS: the patient was seen and examined on 05/21/2022 PCP: Gracelyn Nurse, MD   Patient coming from: Home  Chief Complaint: AMS  HISTORY OF PRESENT ILLNESS: Gary Dawson is an 82 y.o. male brought to the emergency room for altered mental status that started early this morning.  Family noticed worsening confusion delusions and agitation and restlessness.  Patient was recently discharged after having a AAA repair done by Dr. Wyn Quaker.  Patient required sedation upon EMS eval.  HPI as per chart review. Vascular note reviewed and patient POST procedure was stable recovering as expected no acute distress bilateral groin incisions for the endovascular AAA repair was as expected.  Patient was ambulating voiding taking a p.o. diet the wounds were clean dry and intact.  Patient has a history of A-fib and is on Xarelto. On my examination patient was agitated was in A-fib RVR and EKG showed the same with T wave inversions present more prominent in lateral lead from V3 4 5 and 6 than prior EKGs.  Past Medical History:  Diagnosis Date   Acute on chronic HFrEF (heart failure with reduced ejection fraction) (HCC)    a.) TTE 05/18/2020: EF 45%, glob HK, mild LVH, mod reduced RVSF, mild RVE, mod LAE, sev RAE, mod MAC, triv MR, sev TR, mild-mod AoV sclerosis, (+) IAS (L-R); b.) TTE 04/19/2022: EF 30-35%, glob HK, LVH, mod RVE, PASP 40.3, mod TR, mild AR; c.) [facility] 04/20/2022: mRA 8, mPA 27, mPCWP 20, LVEDP 13, CO 4.97, CI 2.34   Aortic atherosclerosis (HCC)    Aortic root dilatation (HCC)    a.) TTE 05/18/2020: 42 mm; b.) TTE 04/19/2022: 40 mm   Atypical chest pain    a. 03/2012 St echo: nl EF, no wma's.   BPH with obstruction/lower urinary tract symptoms    CAD (coronary artery disease)    a.) R/LHC 04/20/2022: 100% RPDA, 40% D1, 30% m-dLAD - med mgmt   Calculus in bladder    Chronic prostatitis     Depression    Elevated PSA    Essential hypertension    History of kidney stones    Hyperlipidemia    Infrarenal abdominal aortic aneurysm (AAA) without rupture (HCC)    a.) CT abd/pelvis 04/18/2022: saccular infrarenal aneurysm measuring up to 5.1 cm   Long term current use of anticoagulant    a.) rivaroxaban   MVP (mitral valve prolapse)    NICM (nonischemic cardiomyopathy) (HCC)    a.) MPI 11/06/2014: EF 30-45%; b.) TTE 05/18/2020: EF 45%; c.) MPI 08/17/2020: EF 25%; d.) TTE 04/19/2022: EF 30-35%   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   a.) MRI brain 07/03/2008 - tiny nonhemorrhagic infarct LEFT posteral parietal lobe   Persistent atrial fibrillation (HCC)    a.) CHA2DS2VASc = 5 (age x2, HFrEF, HTN, vascular disease history);  b.) rate/rhythm maintained on oral digoxin + metoprolol succinate; chronically anticoagulated with rivaroxaban   Psoriasis    Reflux    Sleep apnea    a.) does not utilize nocturnal PAP therapy   Vitamin B 12 deficiency    Review of Systems  Unable to perform ROS: Mental status change   Allergies  Allergen Reactions   Other Other (See Comments)    Trees and grasses    Past Surgical History:  Procedure Laterality Date   CATARACT EXTRACTION, BILATERAL     COLONOSCOPY WITH PROPOFOL     CYSTOSCOPY WITH  LITHOLAPAXY N/A 11/01/2018   Procedure: CYSTOSCOPY WITH LITHOLAPAXY;  Surgeon: [name], MD;  Location: [facility] ORS;  Service: Urology;  Laterality: N/A;   ENDOVASCULAR REPAIR/STENT GRAFT N/A 05/18/2022   Procedure: ENDOVASCULAR REPAIR/STENT GRAFT;  Surgeon: [name], MD;  Location: [facility] INVASIVE CV LAB;  Service: Cardiovascular;  Laterality: N/A;   HOLEP-LASER ENUCLEATION OF THE PROSTATE WITH MORCELLATION N/A 11/01/2018   Procedure: HOLEP-LASER ENUCLEATION OF THE PROSTATE WITH MORCELLATION;  Surgeon: [name], MD;  Location: [facility] ORS;  Service: Urology;  Laterality: N/A;   RIGHT/LEFT HEART CATH AND CORONARY ANGIOGRAPHY N/A 04/20/2022    Procedure: RIGHT/LEFT HEART CATH AND CORONARY ANGIOGRAPHY;  Surgeon: [name], MD;  Location: [facility] INVASIVE CV LAB;  Service: Cardiovascular;  Laterality: N/A;   TONSILLECTOMY     UMBILICAL HERNIA REPAIR     VARICOSE VEIN SURGERY     MEDICATIONS: Prior to Admission medications   Medication Sig Start Date End Date Taking? Authorizing Provider  aspirin EC 81 MG tablet Take 1 tablet (81 mg total) by mouth daily at 6 (six) AM. Swallow whole. 05/20/22  Yes Pace, Brien R, NP  atorvastatin (LIPITOR) 40 MG tablet Take 1 tablet (40 mg total) by mouth daily. Patient taking differently: Take 40 mg by mouth at bedtime. 05/09/22  Yes Furth, Cadence H, PA-C  baclofen (LIORESAL) 10 MG tablet Take 10 mg by mouth at bedtime.   Yes [provider]  Biotin [address] MCG TABS Take 1 tablet by mouth daily at 6 (six) AM.   Yes [provider]  Cyanocobalamin (VITAMIN B-12 PO) Take 1 tablet by mouth daily at 6 (six) AM.   Yes [provider]  dapagliflozin propanediol (FARXIGA) 10 MG TABS tablet Take 1 tablet (10 mg total) by mouth daily. Patient taking differently: Take 10 mg by mouth every morning. 05/09/22  Yes Furth, Cadence H, PA-C  digoxin (LANOXIN) 0.25 MG tablet Take 1 tablet (0.25 mg total) by mouth daily. Patient taking differently: Take 0.25 mg by mouth every morning. 05/09/22  Yes Furth, Cadence H, PA-C  diltiazem (CARDIZEM CD) 120 MG 24 hr capsule Take 120 mg by mouth daily. 05/18/22  Yes [provider]  hydroxypropyl methylcellulose / hypromellose (ISOPTO TEARS / GONIOVISC) 2.5 % ophthalmic solution Place 1 drop into both eyes 3 (three) times daily.   Yes [provider]  losartan (COZAAR) 25 MG tablet Take 1 tablet (25 mg total) by mouth daily. Patient taking differently: Take 25 mg by mouth every morning. 05/05/22  Yes Furth, Cadence H, PA-C  metoprolol succinate (TOPROL-XL) 50 MG 24 hr tablet Take 1 tablet (50 mg total) by mouth 2 (two) times daily. Take  with or immediately following a meal. 05/05/22  Yes Furth, Cadence H, PA-C  oxybutynin (DITROPAN-XL) 5 MG 24 hr tablet Take 5 mg by mouth at bedtime. 05/10/22 05/10/23 Yes [provider]  rivaroxaban (XARELTO) 20 MG TABS tablet TAKE 1 TABLET EVERY DAY WITH SUPPER. He can proceed with AAA intervention at the discretion of vascular surgery.  Rivaroxaban should be held for 2 days before the procedure and restarted as soon as it is safe to do so. Patient taking differently: 20 mg daily with supper. TAKE 1 TABLET EVERY DAY WITH SUPPER. He can proceed with AAA intervention at the discretion of vascular surgery.  Rivaroxaban should be held for 2 days before the procedure and restarted as soon as it is safe to do so. 05/05/22  Yes Furth, Cadence H, PA-C  traMADol (  ULTRAM) 50 MG tablet Take 50 mg by mouth every 6 (six) hours as needed.   Yes [provider]    metoprolol tartrate  2.5 mg Intravenous Once     lactated ringers     ED Course: Pt in Ed awake restless agitated does not follow commands is not soft restraints.  A-fib RVR. Vitals:   05/21/22 0100 05/21/22 0200 05/21/22 0230 05/21/22 0330  BP: (!) 140/88 (!) 112/94 122/66 (!) 172/74  Pulse: 80 98 90 79  Resp:    (!) 23  Temp:      TempSrc:      SpO2: 96% 100% 100% 98%   No intake/output data recorded. SpO2: 98 % Blood work in ed shows: CMP shows normal labs except for mildly elevated total bili at 1.6. Troponin of 20 1 repeat of 22. Lactic of 1 point 4 repeat of 1.2. Procalcitonin of less than 10. CBC shows normal white count hemoglobin, and thrombocytopenia of 141. Urinalysis ordered. Blood cultures collected. CTA chest negative for PE or any other acute cardiopulmonary processes. CT head today negative for any acute intracranial abnormality. April 19, 2022/2D echo: Shows moderate to severe decreased left ventricular ejection fraction with global hypokinesis, mildly reduced systolic function in the right ventricle  which is also moderately enlarged and elevated pulmonary artery pressures aortic dilation noted of the root. In the emergency room patient received Haldol 2 mg, Haldol 2.5 mg, lorazepam 1 mg, lorazepam 2 mg, after seeing patient I started patient on LR at 75 cc an hour, metoprolol 2.5 mg a single dose for A-fib RVR with systolic blood pressure in the 150s.   Results for orders placed or performed during the hospital encounter of 05/20/22 (from the past 72 hour(s))  Comprehensive metabolic panel     Status: Abnormal   Collection Time: 05/20/22  6:50 PM  Result Value Ref Range   Sodium 138 135 - 145 mmol/L   Potassium 3.6 3.5 - 5.1 mmol/L   Chloride 105 98 - 111 mmol/L   CO2 27 22 - 32 mmol/L   Glucose, Bld 101 (H) 70 - 99 mg/dL    Comment: Glucose reference range applies only to samples taken after fasting for at least 8 hours.   BUN 24 (H) 8 - 23 mg/dL   Creatinine, Ser 5.36 0.61 - 1.24 mg/dL   Calcium 8.7 (L) 8.9 - 10.3 mg/dL   Total Protein 8.0 6.5 - 8.1 g/dL   Albumin 4.0 3.5 - 5.0 g/dL   AST 24 15 - 41 U/L   ALT 15 0 - 44 U/L   Alkaline Phosphatase 64 38 - 126 U/L   Total Bilirubin 1.6 (H) 0.3 - 1.2 mg/dL   GFR, Estimated >64 >40 mL/min    Comment: (NOTE) Calculated using the CKD-EPI Creatinine Equation (2021)    Anion gap 6 5 - 15    Comment: Performed at [facility], [address] [place], [place] [address]  CBC with Differential     Status: Abnormal   Collection Time: 05/20/22  6:50 PM  Result Value Ref Range   WBC 10.2 4.0 - 10.5 K/uL   RBC 4.05 (L) 4.22 - 5.81 MIL/uL   Hemoglobin 13.6 13.0 - 17.0 g/dL   HCT 59.5 63.8 - 75.6 %   MCV 100.7 (H) 80.0 - 100.0 fL   MCH 33.6 26.0 - 34.0 pg   MCHC 33.3 30.0 - 36.0 g/dL   RDW 43.3 29.5 - 18.8 %   Platelets 141 (L)  150 - 400 K/uL   nRBC 0.0 0.0 - 0.2 %   Neutrophils Relative % 76 %   Neutro Abs 7.6 1.7 - 7.7 K/uL   Lymphocytes Relative 10 %   Lymphs Abs 1.1 0.7 - 4.0 K/uL   Monocytes Relative 14 %    Monocytes Absolute 1.5 (H) 0.1 - 1.0 K/uL   Eosinophils Relative 0 %   Eosinophils Absolute 0.0 0.0 - 0.5 K/uL   Basophils Relative 0 %   Basophils Absolute 0.0 0.0 - 0.1 K/uL   Immature Granulocytes 0 %   Abs Immature Granulocytes 0.03 0.00 - 0.07 K/uL    Comment: Performed at [facility], [address], [place], [place] [address]  Troponin I (High Sensitivity)     Status: Abnormal   Collection Time: 05/20/22  6:50 PM  Result Value Ref Range   Troponin I (High Sensitivity) 21 (H) <18 ng/L    Comment: (NOTE) Elevated high sensitivity troponin I (hsTnI) values and significant  changes across serial measurements may suggest ACS but many other  chronic and acute conditions are known to elevate hsTnI results.  Refer to the "Links" section for chest pain algorithms and additional  guidance. Performed at [facility], [address] [place], [place] [address]   Lactic acid, plasma     Status: None   Collection Time: 05/20/22  6:50 PM  Result Value Ref Range   Lactic Acid, Venous 1.4 0.5 - 1.9 mmol/L    Comment: Performed at [facility], [address] [place], [place] [address]  Procalcitonin     Status: None   Collection Time: 05/20/22  9:42 PM  Result Value Ref Range   Procalcitonin <0.10 ng/mL    Comment:        Interpretation: PCT (Procalcitonin) <= 0.5 ng/mL: Systemic infection (sepsis) is not likely. Local bacterial infection is possible. (NOTE)       Sepsis PCT Algorithm           Lower Respiratory Tract                                      Infection PCT Algorithm    ----------------------------     ----------------------------         PCT < 0.25 ng/mL                PCT < 0.10 ng/mL          Strongly encourage             Strongly discourage   discontinuation of antibiotics    initiation of antibiotics    ----------------------------     -----------------------------       PCT 0.25 - 0.50 ng/mL            PCT 0.10 - 0.25 ng/mL                OR       >80% decrease in PCT            Discourage initiation of                                            antibiotics      Encourage discontinuation           of antibiotics    ----------------------------     -----------------------------  PCT >= 0.50 ng/mL              PCT 0.26 - 0.50 ng/mL               AND        <80% decrease in PCT             Encourage initiation of                                             antibiotics       Encourage continuation           of antibiotics    ----------------------------     -----------------------------        PCT >= 0.50 ng/mL                  PCT > 0.50 ng/mL               AND         increase in PCT                  Strongly encourage                                      initiation of antibiotics    Strongly encourage escalation           of antibiotics                                     -----------------------------                                           PCT <= 0.25 ng/mL                                                 OR                                        > 80% decrease in PCT                                      Discontinue / Do not initiate                                             antibiotics  Performed at [facility], [address] [place], [place] [address]   Lactic acid, plasma     Status: None   Collection Time: 05/20/22  9:43 PM  Result Value Ref Range   Lactic Acid, Venous 1.2 0.5 - 1.9 mmol/L    Comment: Performed at [facility], [address], [place], [place] [address]  Troponin  I (High Sensitivity)     Status: Abnormal   Collection Time: 05/20/22  9:43 PM  Result Value Ref Range   Troponin I (High Sensitivity) 22 (H) <18 ng/L    Comment: (NOTE) Elevated high sensitivity troponin I (hsTnI) values and significant  changes across serial measurements may suggest ACS but many other  chronic and acute conditions are known to elevate hsTnI results.  Refer to the "Links"  section for chest pain algorithms and additional  guidance. Performed at [facility], [address] [place], [place] [address]     Lab Results  Component Value Date   CREATININE 0.78 05/20/2022   CREATININE 0.84 05/19/2022   CREATININE 0.81 05/10/2022      Latest Ref Rng & Units 05/20/2022    6:50 PM 05/19/2022    4:05 AM 05/10/2022    4:31 PM  CMP  Glucose 70 - 99 mg/dL 621  308  97   BUN 8 - 23 mg/dL 24  18  22    Creatinine 0.61 - 1.24 mg/dL 6.57  8.46  9.62   Sodium 135 - 145 mmol/L 138  138  136   Potassium 3.5 - 5.1 mmol/L 3.6  4.0  4.3   Chloride 98 - 111 mmol/L 105  108  103   CO2 22 - 32 mmol/L 27  24  25    Calcium 8.9 - 10.3 mg/dL 8.7  8.4  8.9   Total Protein 6.5 - 8.1 g/dL 8.0     Total Bilirubin 0.3 - 1.2 mg/dL 1.6     Alkaline Phos 38 - 126 U/L 64     AST 15 - 41 U/L 24     ALT 0 - 44 U/L 15      Unresulted Labs (From admission, onward)     Start     Ordered   05/21/22 0317  Blood gas, venous  Once,   STAT        05/21/22 0318   05/21/22 0317  Lactic acid, plasma  Now then every 2 hours,   STAT      05/21/22 0318   05/21/22 0317  Ammonia  Once,   STAT        05/21/22 0318   05/21/22 0317  Hepatic function panel  Once,   URGENT        05/21/22 0318   05/21/22 0317  CK  Once,   URGENT        05/21/22 0318   05/21/22 0317  Sedimentation rate  Once,   URGENT        05/21/22 0318   05/21/22 0317  Urine Culture  Once,   URGENT       Question:  Indication  Answer:  Altered mental status (if no other cause identified)   05/21/22 0318   05/21/22 0316  Type and screen [facility]  Once,   STAT       Comments: [facility]    05/21/22 0318   05/21/22 0316  Brain natriuretic peptide  Once,   URGENT        05/21/22 0318   05/21/22 0146  Urinalysis, Routine w reflex microscopic -Urine, Clean Catch  Once,   URGENT       Question:  Specimen Source  Answer:  Urine, Clean Catch   05/21/22 0145   05/20/22 1850   Blood culture (routine x 2)  BLOOD CULTURE X 2,   STAT      05/20/22 1849  Pt has received : Orders Placed This Encounter  Procedures   Blood culture (routine x 2)    Standing Status:   Standing    Number of Occurrences:   2   Urine Culture    Standing Status:   Standing    Number of Occurrences:   1    Order Specific Question:   Indication    Answer:   Altered mental status (if no other cause identified)   CT Head Wo Contrast    Standing Status:   Standing    Number of Occurrences:   1   MR BRAIN WO CONTRAST    Standing Status:   Standing    Number of Occurrences:   1    Order Specific Question:   What is the patient's sedation requirement?    Answer:   No Sedation    Order Specific Question:   Does the patient have a pacemaker or implanted devices?    Answer:   No    Order Specific Question:   Radiology Contrast Protocol - do NOT remove file path    Answer:   [email or web address]   DG Chest Portable 1 View    Standing Status:   Standing    Number of Occurrences:   1    Order Specific Question:   Reason for Exam (SYMPTOM  OR DIAGNOSIS REQUIRED)    Answer:   hypoxia   CT Angio Chest PE W and/or Wo Contrast    Standing Status:   Standing    Number of Occurrences:   1    Order Specific Question:   Does the patient have a contrast media/X-ray dye allergy?    Answer:   No    Order Specific Question:   If indicated for the ordered procedure, I authorize the administration of contrast media per Radiology protocol    Answer:   Yes    Order Specific Question:   Radiology Contrast Protocol - do NOT remove file path    Answer:   [email or web address]   Comprehensive metabolic panel    Standing Status:   Standing    Number of Occurrences:   1   CBC with Differential    Standing Status:   Standing    Number of Occurrences:   1   Lactic acid, plasma    Standing Status:   Standing    Number of  Occurrences:   2   Procalcitonin    Standing Status:   Standing    Number of Occurrences:   1   Urinalysis, Routine w reflex microscopic -Urine, Clean Catch    Standing Status:   Standing    Number of Occurrences:   1    Order Specific Question:   Specimen Source    Answer:   Urine, Clean Catch [76]   Brain natriuretic peptide    Standing Status:   Standing    Number of Occurrences:   1   Blood gas, venous    Standing Status:   Standing    Number of Occurrences:   1   Lactic acid, plasma    Standing Status:   Standing    Number of Occurrences:   2   Ammonia    Standing Status:   Standing    Number of Occurrences:   1   Hepatic function panel    Standing Status:   Standing    Number of Occurrences:   1   CK    Standing Status:   Standing  Number of Occurrences:   1   Sedimentation rate    Standing Status:   Standing    Number of Occurrences:   1   Safety Observation    Standing Status:   Standing    Number of Occurrences:   1    Order Specific Question:   Patient behavior assessment    Answer:   Agitated    Order Specific Question:   Patient behavior assessment    Answer:   High fall risk    Order Specific Question:   Patient behavior assessment    Answer:   Confused    Order Specific Question:   Select physical/physiological needs that have been assessed and addressed    Answer:   Pain    Order Specific Question:   Select physical/physiological needs that have been assessed and addressed    Answer:   Electrolyte/fluid imbalance    Order Specific Question:   Select interventions that have been implemented    Answer:   Medication given    Order Specific Question:   Staff nurse/charge nurse to reassess safety requirement every 12 hours    Answer:   2 = Medium (increased aggression, frequent reminders, redirectable)    Order Specific Question:   Has mobile tele-monitoring been trialed    Answer:   Not yet trialed    Order Specific Question:   Type of observation     Answer:   Tele-monitoring   Consult to hospitalist    Standing Status:   Standing    Number of Occurrences:   1    Order Specific Question:   Place call to:    Answer:   hospitalist    Order Specific Question:   Reason for Consult    Answer:   Admit    Order Specific Question:   Diagnosis/Clinical Info for Consult:    Answer:   altered mental state   EKG 12-Lead    Standing Status:   Standing    Number of Occurrences:   1   Type and screen [facility]    [facility]     Standing Status:   Standing    Number of Occurrences:   1    Meds ordered this encounter  Medications   LORazepam (ATIVAN) 2 MG/ML injection    Darcey Nora E: cabinet override   LORazepam (ATIVAN) injection 2 mg   haloperidol lactate (HALDOL) injection 2.5 mg   haloperidol lactate (HALDOL) 5 MG/ML injection    Darcey Nora E: cabinet override   iohexol (OMNIPAQUE) 350 MG/ML injection 100 mL   haloperidol lactate (HALDOL) injection 2 mg   LORazepam (ATIVAN) injection 1 mg   haloperidol lactate (HALDOL) 5 MG/ML injection    Darcey Nora E: cabinet override   LORazepam (ATIVAN) 2 MG/ML injection    Darcey Nora E: cabinet override   DISCONTD: lactated ringers infusion   metoprolol tartrate (LOPRESSOR) injection 2.5 mg   lactated ringers infusion    Admission Imaging : CT Angio Chest PE W and/or Wo Contrast  Result Date: 05/21/2022 CLINICAL DATA:  Hypoxia EXAM: CT ANGIOGRAPHY CHEST WITH CONTRAST TECHNIQUE: Multidetector CT imaging of the chest was performed using the standard protocol during bolus administration of intravenous contrast. Multiplanar CT image reconstructions and MIPs were obtained to evaluate the vascular anatomy. RADIATION DOSE REDUCTION: This exam was performed according to the departmental dose-optimization program which includes automated exposure control, adjustment of the mA and/or kV according to patient size and/or  use of iterative  reconstruction technique. CONTRAST:  OMNIPAQUE IOHEXOL 350 MG/ML SOLN COMPARISON:  None Available. FINDINGS: Cardiovascular: No filling defects in the pulmonary arteries to suggest pulmonary emboli. Heart is mildly enlarged. Diffuse coronary artery and aortic atherosclerosis. No aneurysm. Mediastinum/Nodes: No mediastinal, hilar, or axillary adenopathy. Trachea and esophagus are unremarkable. Thyroid unremarkable. Lungs/Pleura: No confluent opacities or effusions. Upper Abdomen: No acute findings Musculoskeletal: Chest wall soft tissues are unremarkable. No acute bony abnormality. Review of the MIP images confirms the above findings. IMPRESSION: No evidence of pulmonary embolus. Cardiomegaly, coronary artery disease. No acute cardiopulmonary disease. Aortic Atherosclerosis (ICD10-I70.0). Electronically Signed   By: Charlett Nose M.D.   On: 05/21/2022 00:49   DG Chest Portable 1 View  Result Date: 05/20/2022 CLINICAL DATA:  Hypoxia and altered mental status EXAM: PORTABLE CHEST 1 VIEW COMPARISON:  Chest radiograph 04/18/2022 FINDINGS: Stable cardiomegaly.  Aortic atherosclerotic calcification. Increased hazy airspace and interstitial opacities compared with 04/18/2022 with a lower lung predominant. No definite pleural effusion. No pneumothorax. No displaced rib fractures. IMPRESSION: Increased hazy airspace and interstitial opacities compared with 04/18/2022. Findings could reflect pulmonary edema or atypical infection. Electronically Signed   By: Minerva Fester M.D.   On: 05/20/2022 23:43   CT Head Wo Contrast  Result Date: 05/20/2022 CLINICAL DATA:  Altered mental status EXAM: CT HEAD WITHOUT CONTRAST TECHNIQUE: Contiguous axial images were obtained from the base of the skull through the vertex without intravenous contrast. RADIATION DOSE REDUCTION: This exam was performed according to the departmental dose-optimization program which includes automated exposure control, adjustment of the mA and/or kV  according to patient size and/or use of iterative reconstruction technique. COMPARISON:  CT head 04/18/2022 FINDINGS: Brain: No intracranial hemorrhage, mass effect, or evidence of acute infarct. Unchanged ventriculomegaly. No extra-axial fluid collection. Generalized cerebral atrophy. Ill-defined hypoattenuation within the cerebral white matter is nonspecific but consistent with chronic small vessel ischemic disease. Chronic bilateral cerebellar infarcts. Vascular: No hyperdense vessel. Intracranial arterial calcification. Skull: No fracture or focal lesion. Sinuses/Orbits: No acute finding. Paranasal sinuses and mastoid air cells are well aerated. Other: None. IMPRESSION: No acute intracranial abnormality. Chronic small-vessel ischemic disease and unchanged ventriculomegaly. Electronically Signed   By: Minerva Fester M.D.   On: 05/20/2022 19:31   Physical Examination: Vitals:   05/21/22 0100 05/21/22 0200 05/21/22 0230 05/21/22 0330  BP: (!) 140/88 (!) 112/94 122/66 (!) 172/74  Pulse: 80 98 90 79  Temp:      Resp:    (!) 23  SpO2: 96% 100% 100% 98%  TempSrc:       Physical Exam Vitals and nursing note reviewed.  Constitutional:      General: He is not in acute distress.    Appearance: He is not ill-appearing, toxic-appearing or diaphoretic.  HENT:     Head: Normocephalic and atraumatic.     Right Ear: Hearing and external ear normal.     Left Ear: Hearing and external ear normal.     Nose: Nose normal. No nasal deformity.     Mouth/Throat:     Lips: Pink.     Mouth: Mucous membranes are dry.     Tongue: No lesions.  Eyes:     Extraocular Movements: Extraocular movements intact.  Cardiovascular:     Rate and Rhythm: Tachycardia present. Rhythm irregular.     Pulses: Normal pulses.     Heart sounds: Normal heart sounds.  Pulmonary:     Effort: Pulmonary effort is normal.     Breath  sounds: Normal breath sounds.  Abdominal:     General: Bowel sounds are normal. There is no  distension.     Palpations: Abdomen is soft. There is no mass.     Tenderness: There is no guarding.     Hernia: No hernia is present.  Musculoskeletal:     Left lower leg: No edema.  Skin:    General: Skin is warm.  Neurological:     General: No focal deficit present.     Mental Status: He is alert. He is disoriented.     Cranial Nerves: Cranial nerves 2-12 are intact.     Motor: Motor function is intact.  Psychiatric:        Attention and Perception: Attention normal.        Speech: Speech normal.        Behavior: Behavior is cooperative.        Cognition and Memory: Cognition normal.     Assessment and Plan: * Altered mental status, unspecified Patient presenting with acute onset of altered mental status agitation restlessness delirium hallucinations. Could be a combination of dementia with worsening and behavioral disturbance versus TIA versus CVA versus a cardiac event or dysrhythmia. We will admit patient to telemetry unit with continuous cardiac monitoring. Obtain an MRI of the brain. Neuroconsult and EEG per a.m. team as required. Currently patient NPO.   Atrial fibrillation with rapid ventricular response (HCC) Patient given metoprolol 2.5 mg single dose. He is on home medication of metoprolol 50 mg, digoxin 0.25 mg, diltiazem 120 mg. Including Xarelto 20 mg. Will start patient on anticoagulation and continue on diltiazem IV.  Until he is coherent and able to take p.o.  Abdominal aortic aneurysm (AAA) without rupture [facility]) Status post endovascular repair. Vascular consult per a.m. team. Not sure if patient has an embolic episode or TIA postprocedure. Will consider CT angio of the abdomen and pelvis as deemed appropriate.   Benign essential hypertension Vitals:   05/20/22 1930 05/20/22 2000 05/20/22 2030 05/20/22 2100  BP: (!) 157/100 (!) 147/91 (!) 156/82 (!) 147/80   05/20/22 2130 05/20/22 2245 05/20/22 2300 05/21/22 0000  BP: (!) 140/78 119/66 133/77 128/88    05/21/22 0100 05/21/22 0200 05/21/22 0230 05/21/22 0330  BP: (!) 140/88 (!) 112/94 122/66 (!) 172/74  Pressures have been intermittently elevated secondary to agitation and restlessness. Home regimen of p.o. meds is currently held. Patient will be managed on IV diltiazem regimen.          DVT prophylaxis:  Xarelto Code Status:  Full code    05/18/2022   11:30 AM  Advanced Directives  Does Patient Have a Medical Advance Directive? No  Would patient like information on creating a medical advance directive? No - Patient declined    Family Communication:  None none Emergency Contact: Contact Information     Name Relation Home Work Mobile   Thompson,Jodie Significant other   [phone]   Rankin,Gena Daughter   [phone]       Disposition Plan:  To be determined Consults: None Admission status: Inpatient Unit / Expected LOS: Med/tele/2 days  Gertha Calkin MD [facility]  6 PM- 2 AM. [phone]( Pager )  For questions regarding this patient please use [email or web address] to contact the current [facility] MD.   Bonita Quin may also call [phone] to contact current Assigned [facility] Attending/Consulting MD for this patient.

## 2022-05-21 NOTE — ED Notes (Signed)
Pt taken to CT.

## 2022-05-21 NOTE — Assessment & Plan Note (Signed)
Patient presenting with acute onset of altered mental status agitation restlessness delirium hallucinations. Could be a combination of dementia with worsening and behavioral disturbance versus TIA versus CVA versus a cardiac event or dysrhythmia. We will admit patient to telemetry unit with continuous cardiac monitoring. Obtain an MRI of the brain. Neuroconsult and EEG per a.m. team as required. Currently patient NPO.

## 2022-05-21 NOTE — Assessment & Plan Note (Signed)
Patient given metoprolol 2.5 mg single dose. He is on home medication of metoprolol 50 mg, digoxin 0.25 mg, diltiazem 120 mg. Including Xarelto 20 mg. Will start patient on anticoagulation and continue on diltiazem IV.  Until he is coherent and able to take p.o.

## 2022-05-21 NOTE — Progress Notes (Addendum)
PROGRESS NOTE    [name]   ZOX:[id number] DOB: [birth year]  DOA: 05/20/2022 Date of Service: 05/21/22 PCP: Gracelyn Nurse, MD     Brief Narrative / Hospital Course:  Gary Dawson is a 82 y.o. 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 To ED via EMS from home with sudden onset of AMS @ 1600 05/04 with delusions and aggression. Pt was discharged from hospital 2 days prior following AAA repair (Drs Wyn Quaker and [employer]).  05/04:  Pt was given 5 mg Versed IM and 2.5 mg Haldol IM by EMS @ 1820. Difficulty w/ workup/imaging d/t confusion. CTA chest no concerns.  CT head no concerns, does show chronic small-vessel ischemic disease and unchanged ventriculomegaly. Labs no concerns, no apparent infection.  05/05: early AM admitted to hospitalist service for Afib RVR and AMS. Was placed on heparin and diltiazem gtt, these were d/c later int he morning - rate controlled and able to take pills w/ Xarelto. SLP eval. MRI brain non-acute. Long discussion w/ daughter and with his significant other - they describe some paranoid delusional type behavior (accusing neighbor and family of poisoning him, etc), daughter suspects possible cognitive impairment/sundowning type issue, SO notes he has been just like this in the past w/ any anesthesia even or hospitalization.   Consultants:  none  Procedures: none  Gwenette Greet, SO is medical decision maker - Advanced Directive should be in process of being scanned in.      ASSESSMENT & PLAN:   Principal Problem:   Altered mental status, unspecified Active Problems:   Atrial fibrillation with rapid ventricular response (HCC)   Permanent atrial fibrillation (HCC)   NICM (nonischemic cardiomyopathy) (HCC)   History of cardioembolic cerebrovascular accident (CVA)   Benign essential hypertension   Microscopic hematuria   Pure hypercholesterolemia   Sleep apnea in adult   OSA on CPAP    Abdominal aortic aneurysm (AAA) without rupture (HCC)   AAA (abdominal aortic aneurysm) without rupture (HCC)  Altered mental status / Delirium Question embolic complication to brain post-AAA repair? Per family, had similar issue w/ previous surgery - confusion few days after  Suspect underlying mild/moderate cognitive impairment exacerbated by anesthesia / stressors, possible underlying psychiatric d/o  MRI brain no concerns  Consider EEG w/ neuro consult, doubtful need for LP  NPO except meds/sips pending SLP eval - RN in ED did basic swallow eval, pt needs frequent redirection but seems ok to swallow pills   Atrial fibrillation RVR Now rate controlled  Off diltiazem gtt restarted home meds diltiazem, dignoxin, metoprolol, rivaroxaban  Hx HFrEF, Nonischemic Cardiomyopathy 04/19/22: 2D echo (+)moderate to severe decreased LVEF 30-35% with global hypokinesis- per cardiology note 04/19 felt to be from tachycardia. Prior EF was 45%.  Appears euvolemic at this time Monitor fluid status closely Continue beta blocker, SLGT2, statin holding ACE/ARB d/t low BP Stopped IV fluids  Strict I&O  Recent repair AAA w/ vascular surgery  Imaging / consult if needed  Essential HTN Resume po meds w/ diltiazem, metoprolol Holding losartan d/t softer BP  CAD Recent cardiac cath showed heavily calcified coronary arteries with mild to moderate nonobstructive CAD and occluded proximal right PDA with L>R collateral  No ASA per cardiology - will d/c this from his list  Continue statin and beta blocker   HLD Statin    DVT prophylaxis: Xarelto Pertinent IV fluids/nutrition: stopped LR d/t hx CHF. SLP eval prior to po intake but  for now NPO except meds/sips Central lines / invasive devices: none. PurWick is in place will remove when able to use urinal   Code Status: FULL CODE ACP documentation reviewed: 05/05 none on file in [facility]   Current Admission Status: observation - suspect may need to  transitino to inpatient   TOC needs / Dispo plan: TBD pending clinical improvement  Barriers to discharge / significant pending items: medical w/u AMS            Subjective / Brief ROS:  Patient confused, unable to contribute to history at this time    Family Communication: none at this time    Objective Findings:  Vitals:   05/21/22 0800 05/21/22 0900 05/21/22 1002 05/21/22 1215  BP: (!) 150/91 (!) 150/86 (!) 152/62   Pulse: (!) 102 100 92   Resp:  (!) 27 16   Temp:   98.1 F (36.7 C)   TempSrc:      SpO2: 98% 100% 100%   Weight:    84.4 kg  Height:    6\' 1"  (1.854 m)   No intake or output data in the 24 hours ending 05/21/22 1457 Filed Weights   05/21/22 1215  Weight: 84.4 kg    Examination:  Physical Exam Constitutional:      General: He is not in acute distress.    Appearance: He is not ill-appearing.  Cardiovascular:     Rate and Rhythm: Normal rate. Rhythm irregular.  Pulmonary:     Effort: Pulmonary effort is normal.     Breath sounds: Normal breath sounds.  Abdominal:     General: Abdomen is flat. Bowel sounds are normal.     Palpations: Abdomen is soft.  Musculoskeletal:     Right lower leg: No edema.     Left lower leg: No edema.  Neurological:     Mental Status: He is disoriented.          Scheduled Medications:   atorvastatin  40 mg Oral Daily   dapagliflozin propanediol  10 mg Oral Daily   digoxin  0.25 mg Oral BH-q7a   diltiazem  120 mg Oral Daily   metoprolol succinate  50 mg Oral BID WC   oxybutynin  5 mg Oral QHS   rivaroxaban  20 mg Oral Q supper   sodium chloride flush  3 mL Intravenous Q12H    Continuous Infusions:   PRN Medications:  acetaminophen **OR** acetaminophen  Antimicrobials from admission:  Anti-infectives (From admission, onward)    None           Data Reviewed:  I have personally reviewed the following...  CBC: Recent Labs  Lab 05/19/22 0405 05/20/22 1850 05/21/22 1035  WBC 12.0*  10.2 8.9  NEUTROABS  --  7.6  --   HGB 11.7* 13.6 12.2*  HCT 34.1* 40.8 36.4*  MCV 98.6 100.7* 98.6  PLT 141* 141* 146*   Basic Metabolic Panel: Recent Labs  Lab 05/19/22 0405 05/20/22 1850  NA 138 138  K 4.0 3.6  CL 108 105  CO2 24 27  GLUCOSE 129* 101*  BUN 18 24*  CREATININE 0.84 0.78  CALCIUM 8.4* 8.7*   GFR: Estimated Creatinine Clearance: 83.2 mL/min (by C-G formula based on SCr of 0.78 mg/dL). Liver Function Tests: Recent Labs  Lab 05/20/22 1850 05/21/22 0329  AST 24 35  ALT 15 16  ALKPHOS 64 62  BILITOT 1.6* 2.0*  PROT 8.0 7.2  ALBUMIN 4.0 3.6   No results for input(s): "  LIPASE", "AMYLASE" in the last 168 hours. Recent Labs  Lab 05/21/22 0329  AMMONIA 11   Coagulation Profile: Recent Labs  Lab 05/21/22 1035  INR 1.4*   Cardiac Enzymes: Recent Labs  Lab 05/21/22 0329  CKTOTAL 229   BNP (last 3 results) No results for input(s): "PROBNP" in the last 8760 hours. HbA1C: No results for input(s): "HGBA1C" in the last 72 hours. CBG: Recent Labs  Lab 05/18/22 1122  GLUCAP 94   Lipid Profile: No results for input(s): "CHOL", "HDL", "LDLCALC", "TRIG", "CHOLHDL", "LDLDIRECT" in the last 72 hours. Thyroid Function Tests: No results for input(s): "TSH", "T4TOTAL", "FREET4", "T3FREE", "THYROIDAB" in the last 72 hours. Anemia Panel: No results for input(s): "VITAMINB12", "FOLATE", "FERRITIN", "TIBC", "IRON", "RETICCTPCT" in the last 72 hours. Most Recent Urinalysis On File:     Component Value Date/Time   COLORURINE YELLOW (A) 05/21/2022 0329   APPEARANCEUR CLEAR (A) 05/21/2022 0329   APPEARANCEUR Clear 03/09/2022 0817   LABSPEC >1.046 (H) 05/21/2022 0329   PHURINE 5.0 05/21/2022 0329   GLUCOSEU 150 (A) 05/21/2022 0329   HGBUR LARGE (A) 05/21/2022 0329   BILIRUBINUR NEGATIVE 05/21/2022 0329   BILIRUBINUR Negative 03/09/2022 0817   KETONESUR 5 (A) 05/21/2022 0329   PROTEINUR 30 (A) 05/21/2022 0329   NITRITE NEGATIVE 05/21/2022 0329    LEUKOCYTESUR NEGATIVE 05/21/2022 0329   Sepsis Labs: @LABRCNTIP (procalcitonin:4,lacticidven:4) Microbiology: Recent Results (from the past 240 hour(s))  MRSA Next Gen by PCR, Nasal     Status: None   Collection Time: 05/18/22 11:33 AM   Specimen: Nasal Mucosa; Nasal Swab  Result Value Ref Range Status   MRSA by PCR Next Gen NOT DETECTED NOT DETECTED Final    Comment: (NOTE) The GeneXpert MRSA Assay (FDA approved for NASAL specimens only), is one component of a comprehensive MRSA colonization surveillance program. It is not intended to diagnose MRSA infection nor to guide or monitor treatment for MRSA infections. Test performance is not FDA approved in patients less than 22 years old. Performed at [facility], [address] [place], [place] [address]   Blood culture (routine x 2)     Status: None (Preliminary result)   Collection Time: 05/20/22  6:50 PM   Specimen: BLOOD  Result Value Ref Range Status   Specimen Description BLOOD RIGHT ANTECUBITAL  Final   Special Requests   Final    BOTTLES DRAWN AEROBIC AND ANAEROBIC Blood Culture adequate volume   Culture   Final    NO GROWTH < 12 HOURS Performed at [facility], [address], [place], [place] [address]    Report Status PENDING  Incomplete  Blood culture (routine x 2)     Status: None (Preliminary result)   Collection Time: 05/20/22  6:55 PM   Specimen: BLOOD  Result Value Ref Range Status   Specimen Description BLOOD LEFT ANTECUBITAL  Final   Special Requests   Final    BOTTLES DRAWN AEROBIC AND ANAEROBIC Blood Culture adequate volume   Culture   Final    NO GROWTH < 12 HOURS Performed at [facility], [address] [place], [place] [address]    Report Status PENDING  Incomplete      Radiology Studies last 3 days: MR BRAIN WO CONTRAST  Result Date: 05/21/2022 CLINICAL DATA:  Altered mental status beginning today. Worsening confusion. EXAM: MRI HEAD WITHOUT CONTRAST TECHNIQUE:  Multiplanar, multiecho pulse sequences of the brain and surrounding structures were obtained without intravenous contrast. COMPARISON:  Head CT yesterday FINDINGS:  Brain: Study suffers from considerable motion degradation. Diffusion imaging does not show any acute or subacute infarction. There are chronic small-vessel ischemic changes of the pons. 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 Cerebral hemispheres show volume loss with some old small vessel insults of the white matter. No cortical or large vessel territory infarction. No hemorrhage, hydrocephalus or extra-axial collection. Vascular: Major vessels at the base of the brain show flow. Skull and upper cervical spine: Negative Sinuses/Orbits: Clear Other: None IMPRESSION: Motion degraded study. No acute finding. Chronic small-vessel ischemic changes of the pons and cerebral hemispheric white matter. 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 Electronically Signed   By: Paulina Fusi M.D.   On: 05/21/2022 14:11   CT Angio Chest PE W and/or Wo Contrast  Result Date: 05/21/2022 CLINICAL DATA:  Hypoxia EXAM: CT ANGIOGRAPHY CHEST WITH CONTRAST TECHNIQUE: Multidetector CT imaging of the chest was performed using the standard protocol during bolus administration of intravenous contrast. Multiplanar CT image reconstructions and MIPs were obtained to evaluate the vascular anatomy. RADIATION DOSE REDUCTION: This exam was performed according to the departmental dose-optimization program which includes automated exposure control, adjustment of the mA and/or kV according to patient size and/or use of iterative reconstruction technique. CONTRAST:  OMNIPAQUE IOHEXOL 350 MG/ML SOLN COMPARISON:  None Available. FINDINGS: Cardiovascular: No filling defects in the pulmonary arteries to suggest pulmonary emboli. Heart is mildly enlarged. Diffuse coronary artery and aortic atherosclerosis. No aneurysm. Mediastinum/Nodes: No mediastinal, hilar, or axillary  adenopathy. Trachea and esophagus are unremarkable. Thyroid unremarkable. Lungs/Pleura: No confluent opacities or effusions. Upper Abdomen: No acute findings Musculoskeletal: Chest wall soft tissues are unremarkable. No acute bony abnormality. Review of the MIP images confirms the above findings. IMPRESSION: No evidence of pulmonary embolus. Cardiomegaly, coronary artery disease. No acute cardiopulmonary disease. Aortic Atherosclerosis (ICD10-I70.0). Electronically Signed   By: Charlett Nose M.D.   On: 05/21/2022 00:49   DG Chest Portable 1 View  Result Date: 05/20/2022 CLINICAL DATA:  Hypoxia and altered mental status EXAM: PORTABLE CHEST 1 VIEW COMPARISON:  Chest radiograph 04/18/2022 FINDINGS: Stable cardiomegaly.  Aortic atherosclerotic calcification. Increased hazy airspace and interstitial opacities compared with 04/18/2022 with a lower lung predominant. No definite pleural effusion. No pneumothorax. No displaced rib fractures. IMPRESSION: Increased hazy airspace and interstitial opacities compared with 04/18/2022. Findings could reflect pulmonary edema or atypical infection. Electronically Signed   By: Minerva Fester M.D.   On: 05/20/2022 23:43   CT Head Wo Contrast  Result Date: 05/20/2022 CLINICAL DATA:  Altered mental status EXAM: CT HEAD WITHOUT CONTRAST TECHNIQUE: Contiguous axial images were obtained from the base of the skull through the vertex without intravenous contrast. RADIATION DOSE REDUCTION: This exam was performed according to the departmental dose-optimization program which includes automated exposure control, adjustment of the mA and/or kV according to patient size and/or use of iterative reconstruction technique. COMPARISON:  CT head 04/18/2022 FINDINGS: Brain: No intracranial hemorrhage, mass effect, or evidence of acute infarct. Unchanged ventriculomegaly. No extra-axial fluid collection. Generalized cerebral atrophy. Ill-defined hypoattenuation within the cerebral white matter is  nonspecific but consistent with chronic small vessel ischemic disease. Chronic bilateral cerebellar infarcts. Vascular: No hyperdense vessel. Intracranial arterial calcification. Skull: No fracture or focal lesion. Sinuses/Orbits: No acute finding. Paranasal sinuses and mastoid air cells are well aerated. Other: None. IMPRESSION: No acute intracranial abnormality. Chronic small-vessel ischemic disease and unchanged ventriculomegaly. Electronically Signed   By: Minerva Fester M.D.   On: 05/20/2022 19:31   PERIPHERAL VASCULAR CATHETERIZATION  Result  Date: 05/18/2022 See surgical note for result.            LOS: 0 days    Time spent: 50 min    Sunnie Nielsen, DO [facility] 05/21/2022, 2:57 PM    Dictation software may have been used to generate the above note. Typos may occur and escape review in typed/dictated notes. Please contact Dr Lyn Hollingshead directly for clarity if needed.  Staff may message me via secure chat in Epic  but this may not receive an immediate response,  please page me for urgent matters!  If 7PM-7AM, please contact night coverage [email or web address]

## 2022-05-21 NOTE — ED Notes (Signed)
RN responded to bed alarm tofind him trying to get out of bed.  RN called for assistance and pt was assisted back into bed.  Monitoring equipment was replaced

## 2022-05-21 NOTE — Plan of Care (Signed)

## 2022-05-22 DIAGNOSIS — R4182 Altered mental status, unspecified: Secondary | ICD-10-CM | POA: Diagnosis not present

## 2022-05-22 DIAGNOSIS — I4891 Unspecified atrial fibrillation: Secondary | ICD-10-CM | POA: Diagnosis not present

## 2022-05-22 DIAGNOSIS — I7143 Infrarenal abdominal aortic aneurysm, without rupture: Secondary | ICD-10-CM | POA: Diagnosis not present

## 2022-05-22 DIAGNOSIS — I714 Abdominal aortic aneurysm, without rupture, unspecified: Secondary | ICD-10-CM | POA: Diagnosis not present

## 2022-05-22 DIAGNOSIS — F03918 Unspecified dementia, unspecified severity, with other behavioral disturbance: Principal | ICD-10-CM | POA: Insufficient documentation

## 2022-05-22 LAB — URINE CULTURE: Culture: NO GROWTH

## 2022-05-22 LAB — CULTURE, BLOOD (ROUTINE X 2): Special Requests: ADEQUATE

## 2022-05-22 NOTE — Evaluation (Signed)
Clinical/Bedside Swallow Evaluation Patient Details  Name: Gary Dawson MRN: [id number] Date of Birth: July 28, 1940  Today's Date: 05/22/2022 Time: SLP Start Time (ACUTE ONLY): 0850 SLP Stop Time (ACUTE ONLY): 0915 SLP Time Calculation (min) (ACUTE ONLY): 25 min  Past Medical History:  Past Medical History:  Diagnosis Date   Acute on chronic HFrEF (heart failure with reduced ejection fraction) (HCC)    a.) TTE 05/18/2020: EF 45%, glob HK, mild LVH, mod reduced RVSF, mild RVE, mod LAE, sev RAE, mod MAC, triv MR, sev TR, mild-mod AoV sclerosis, (+) IAS (L-R); b.) TTE 04/19/2022: EF 30-35%, glob HK, LVH, mod RVE, PASP 40.3, mod TR, mild AR; c.) [facility] 04/20/2022: mRA 8, mPA 27, mPCWP 20, LVEDP 13, CO 4.97, CI 2.34   Aortic atherosclerosis (HCC)    Aortic root dilatation (HCC)    a.) TTE 05/18/2020: 42 mm; b.) TTE 04/19/2022: 40 mm   Atypical chest pain    a. 03/2012 St echo: nl EF, no wma's.   BPH with obstruction/lower urinary tract symptoms    CAD (coronary artery disease)    a.) R/LHC 04/20/2022: 100% RPDA, 40% D1, 30% m-dLAD - med mgmt   Calculus in bladder    Chronic prostatitis    Depression    Elevated PSA    Essential hypertension    History of kidney stones    Hyperlipidemia    Infrarenal abdominal aortic aneurysm (AAA) without rupture (HCC)    a.) CT abd/pelvis 04/18/2022: saccular infrarenal aneurysm measuring up to 5.1 cm   Long term current use of anticoagulant    a.) rivaroxaban   MVP (mitral valve prolapse)    NICM (nonischemic cardiomyopathy) (HCC)    a.) MPI 11/06/2014: EF 30-45%; b.) TTE 05/18/2020: EF 45%; c.) MPI 08/17/2020: EF 25%; d.) TTE 04/19/2022: EF 30-35%   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   a.) MRI brain 07/03/2008 - tiny nonhemorrhagic infarct LEFT posteral parietal lobe   Persistent atrial fibrillation (HCC)    a.) CHA2DS2VASc = 5 (age x2, HFrEF, HTN, vascular disease history);  b.) rate/rhythm maintained on oral digoxin + metoprolol  succinate; chronically anticoagulated with rivaroxaban   Psoriasis    Reflux    Sleep apnea    a.) does not utilize nocturnal PAP therapy   Vitamin B 12 deficiency    Past Surgical History:  Past Surgical History:  Procedure Laterality Date   CATARACT EXTRACTION, BILATERAL     COLONOSCOPY WITH PROPOFOL     CYSTOSCOPY WITH LITHOLAPAXY N/A 11/01/2018   Procedure: CYSTOSCOPY WITH LITHOLAPAXY;  Surgeon: [name], MD;  Location: [facility] ORS;  Service: Urology;  Laterality: N/A;   ENDOVASCULAR REPAIR/STENT GRAFT N/A 05/18/2022   Procedure: ENDOVASCULAR REPAIR/STENT GRAFT;  Surgeon: [name], MD;  Location: [facility] INVASIVE CV LAB;  Service: Cardiovascular;  Laterality: N/A;   HOLEP-LASER ENUCLEATION OF THE PROSTATE WITH MORCELLATION N/A 11/01/2018   Procedure: HOLEP-LASER ENUCLEATION OF THE PROSTATE WITH MORCELLATION;  Surgeon: [name], MD;  Location: [facility] ORS;  Service: Urology;  Laterality: N/A;   RIGHT/LEFT HEART CATH AND CORONARY ANGIOGRAPHY N/A 04/20/2022   Procedure: RIGHT/LEFT HEART CATH AND CORONARY ANGIOGRAPHY;  Surgeon: [name], MD;  Location: [facility] INVASIVE CV LAB;  Service: Cardiovascular;  Laterality: N/A;   TONSILLECTOMY     UMBILICAL HERNIA REPAIR     VARICOSE VEIN SURGERY     HPI:  and patient POST procedure was stable recovering as expected no acute distress bilateral groin incisions for the endovascular AAA repair was  as expected. Patient was ambulating voiding taking a p.o. diet the wounds were clean dry and intact. Patient has a history of A-fib and is on Xarelto. On my examination patient was agitated was in A-fib RVR and EKG showed the same with T wave inversions present more prominent in lateral lead from V3 4 5 and 6 than prior EKGs." CT angio chest, "No evidence of pulmonary embolus. Cardiomegaly, coronary artery disease. No acute cardiopulmonary disease. Aortic Atherosclerosis (ICD10-I70.0)." MRI Brain, "Motion degraded study. No acute finding.  Chronic small-vessel ischemic changes of the pons and cerebral hemispheric white matter. 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 "    Assessment / Plan / Recommendation  Clinical Impression  Pt seen for clinical swallowing evaluation. Pt alert and cooperative, although notably confused. Safety sitter present for evaluation. Hypophonic vocal quality noted at times; otherwise, oral motor examination was unremarkable. Pt given trials of solid, puree, and thin liquids. Pt demonstrated an intact oral swallow. Pharyngeal swallow appeared [facility] per clinical assessment. Recommend initiation of a regular diet with thin liquids with safe swallowing strategies/aspiration precautions as outlined below. SLP to f/u x1 for diet tolerance given pt's mental status and comorbidities.   SLP Visit Diagnosis: Dysphagia, unspecified (R13.10)    Aspiration Risk  Mild aspiration risk    Diet Recommendation Regular;Thin liquid   Liquid Administration via: Spoon;Cup;Straw Medication Administration: Whole meds with puree Supervision: Patient able to self feed Compensations: Minimize environmental distractions;Slow rate;Small sips/bites    Other  Recommendations Oral Care Recommendations: Oral care QID    Recommendations for follow up therapy are one component of a multi-disciplinary discharge planning process, led by the attending physician.  Recommendations may be updated based on patient status, additional functional criteria and insurance authorization.  Follow up Recommendations  (TBD)      Assistance Recommended at Discharge    Functional Status Assessment Patient has had a recent decline in their functional status and demonstrates the ability to make significant improvements in function in a reasonable and predictable amount of time.  Frequency and Duration min 2x/week  2 weeks       Prognosis Prognosis for improved oropharyngeal function: Fair Barriers to Reach Goals: Cognitive deficits       Swallow Study   General Date of Onset: 05/20/22 HPI: and patient POST procedure was stable recovering as expected no acute distress bilateral groin incisions for the endovascular AAA repair was as expected. Patient was ambulating voiding taking a p.o. diet the wounds were clean dry and intact. Patient has a history of A-fib and is on Xarelto. On my examination patient was agitated was in A-fib RVR and EKG showed the same with T wave inversions present more prominent in lateral lead from V3 4 5 and 6 than prior EKGs." CT angio chest, "No evidence of pulmonary embolus. Cardiomegaly, coronary artery disease. No acute cardiopulmonary disease. Aortic Atherosclerosis (ICD10-I70.0)." MRI Brain, "Motion degraded study. No acute finding. Chronic small-vessel ischemic changes of the pons and cerebral hemispheric white matter. 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 " Type of Study: Bedside Swallow Evaluation Previous Swallow Assessment: none Diet Prior to this Study: NPO Respiratory Status: Room air History of Recent Intubation: No Behavior/Cognition: Alert;Cooperative;Pleasant mood;Confused Oral Cavity Assessment: Within Functional Limits Oral Care Completed by SLP: Recent completion by staff Oral Cavity - Dentition: Adequate natural dentition Vision: Functional for self-feeding Self-Feeding Abilities: Able to feed self Patient Positioning: Upright in bed Baseline Vocal Quality: Normal;Low vocal intensity Volitional Cough: Strong Volitional Swallow: Able to  elicit    Oral/Motor/Sensory Function Overall Oral Motor/Sensory Function: Within functional limits   Ice Chips Ice chips: Within functional limits Presentation: Self Fed;Spoon   Thin Liquid Thin Liquid: Within functional limits Presentation: Cup;Straw Other Comments: ~8 oz total    Nectar Thick Nectar Thick Liquid: Not tested   Honey Thick Honey Thick Liquid: Not tested   Puree Puree: Within functional limits Presentation:  Spoon;Self Fed Other Comments: ~4 oz   Solid     Solid: Within functional limits Presentation: Self Fed Other Comments: x3 graham crackers     Clyde Canterbury, M.S., CCC-SLP Speech-Language Pathologist [facility] [phone] (ASCOM)   [name] Kerrington [place]halgh 05/22/2022,9:55 AM

## 2022-05-22 NOTE — Progress Notes (Signed)
PROGRESS NOTE    Gary Dawson   ZOX:[id number] DOB: 1940/02/25  DOA: 05/20/2022 Date of Service: 05/22/22 PCP: Gracelyn Nurse, MD     Brief Narrative / Hospital Course:  Gary Dawson is a 82 y.o. 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 To ED via EMS from home with sudden onset of AMS @ 1600 05/04 with delusions and aggression. Pt was discharged from hospital 2 days prior following AAA repair (Drs Wyn Quaker and [employer]).  05/04:  Pt was given 5 mg Versed IM and 2.5 mg Haldol IM by EMS @ 1820. Difficulty w/ workup/imaging d/t confusion. CTA chest no concerns.  CT head no concerns, does show chronic small-vessel ischemic disease and unchanged ventriculomegaly. Labs no concerns, no apparent infection.  05/05: early AM admitted to hospitalist service for Afib RVR and AMS. Was placed on heparin and diltiazem gtt, these were d/c later int he morning - rate controlled and able to take pills w/ Xarelto. SLP eval. MRI brain non-acute. Long discussion w/ daughter and with his significant other - they describe some paranoid delusional type behavior (accusing neighbor and family of poisoning him, etc), daughter suspects possible cognitive impairment/sundowning type issue, SO notes he has been just like this in the past w/ anesthesia event or hospitalization.  05/06: SLP clear for diet. PT and OT to see. Pt more alert today, still confused, oriented to self, knows he is in hospital but cannot say why ("recovering from AAA"), initially told me he was in [place] then corrected to [facility], when asked about family he states "I don't have a daughter."   Consultants:  none  Procedures: none      ASSESSMENT & PLAN:   Principal Problem:   Altered mental status, unspecified Active Problems:   Atrial fibrillation with rapid ventricular response (HCC)   Permanent atrial fibrillation (HCC)   NICM (nonischemic cardiomyopathy) (HCC)   History of  cardioembolic cerebrovascular accident (CVA)   Benign essential hypertension   Microscopic hematuria   Pure hypercholesterolemia   Sleep apnea in adult   OSA on CPAP   Abdominal aortic aneurysm (AAA) without rupture (HCC)   AAA (abdominal aortic aneurysm) without rupture (HCC)  Altered mental status / Delirium Question embolic complication to brain post-AAA repair? Per family, had similar issue w/ previous surgery - confusion few days after  Suspect underlying mild/moderate cognitive impairment exacerbated by anesthesia / stressors, possible underlying psychiatric d/o  MRI brain no concerns  Feels likely to be related to cognitive impairment, will ask psychiatry to evaluate given recent sudden change and concern for delusional/paranoid behavior though this certainly can also present in dementia  Atrial fibrillation RVR Now rate controlled  Off diltiazem gtt restarted home meds diltiazem, dignoxin, metoprolol, rivaroxaban  Hx HFrEF, Nonischemic Cardiomyopathy 04/19/22: 2D echo (+)moderate to severe decreased LVEF 30-35% with global hypokinesis- per cardiology note 04/19 felt to be from tachycardia. Prior EF was 45%.  Appears euvolemic at this time Monitor fluid status closely Continue beta blocker, SLGT2, statin holding ACE/ARB d/t low BP Stopped IV fluids  Strict I&O  Recent repair AAA w/ vascular surgery  Imaging / consult if needed  Essential HTN Resume po meds w/ diltiazem, metoprolol Holding losartan d/t softer BP  CAD Recent cardiac cath showed heavily calcified coronary arteries with mild to moderate nonobstructive CAD and occluded proximal right PDA with L>R collateral  No ASA per cardiology - will d/c this from his list  Continue  statin and beta blocker   HLD Statin   Advanced care planning  Gwenette Greet, SO is medical decision maker - Advanced Directive should be in process of being scanned in. Daughter Adam Phenix confirms Scherry Ran is HCPOA.  DNR status  confirmed Need scanned advanced directive / other forms     DVT prophylaxis: Xarelto Pertinent IV fluids/nutrition: clear for diet, no conitnuous IV fluids  Central lines / invasive devices: none. PurWick is in place will remove when able to use urinal   Code Status: FULL CODE ACP documentation reviewed: 05/05 none on file in [facility]   Current Admission Status: inpatient TOC needs / Dispo plan: TBD pending clinical improvement  Barriers to discharge / significant pending items: medical w/u AMS, psychiatry consultation             Subjective / Brief ROS:  Patient confused, oriented to self, knows he is in hospital but cannot say why ("recovering from AAA"), initially told me he was in [place] then corrected to [facility], when asked about family he states "I don't have a daughter." He states he otherwise feels fine, no physical complaints. No CP/SOB, no pain.    Family Communication: none at this time    Objective Findings:  Vitals:   05/21/22 1753 05/22/22 0000 05/22/22 0346 05/22/22 0809  BP: 126/75 (!) 142/77  132/69  Pulse: (!) 106 81  90  Resp:  16  19  Temp:  98.4 F (36.9 C)  97.6 F (36.4 C)  TempSrc:  Oral    SpO2:  99%  100%  Weight:   79.1 kg   Height:        Intake/Output Summary (Last 24 hours) at 05/22/2022 1204 Last data filed at 05/22/2022 1042 Gross per 24 hour  Intake 988.11 ml  Output 650 ml  Net 338.11 ml   Filed Weights   05/21/22 1215 05/22/22 0346  Weight: 84.4 kg 79.1 kg    Examination:  Physical Exam Constitutional:      General: He is not in acute distress.    Appearance: He is not ill-appearing.  Cardiovascular:     Rate and Rhythm: Normal rate. Rhythm irregular.  Pulmonary:     Effort: Pulmonary effort is normal.     Breath sounds: Normal breath sounds.  Abdominal:     General: Abdomen is flat. Bowel sounds are normal.     Palpations: Abdomen is soft.  Musculoskeletal:     Right lower leg: No edema.     Left lower leg: No  edema.  Neurological:     Mental Status: He is disoriented.          Scheduled Medications:   atorvastatin  40 mg Oral Daily   dapagliflozin propanediol  10 mg Oral Daily   digoxin  0.25 mg Oral BH-q7a   diltiazem  120 mg Oral Daily   metoprolol succinate  50 mg Oral BID WC   oxybutynin  5 mg Oral QHS   rivaroxaban  20 mg Oral Q supper   sodium chloride flush  3 mL Intravenous Q12H    Continuous Infusions:   PRN Medications:  acetaminophen **OR** acetaminophen  Antimicrobials from admission:  Anti-infectives (From admission, onward)    None           Data Reviewed:  I have personally reviewed the following...  CBC: Recent Labs  Lab 05/19/22 0405 05/20/22 1850 05/21/22 1035  WBC 12.0* 10.2 8.9  NEUTROABS  --  7.6  --   HGB 11.7*  13.6 12.2*  HCT 34.1* 40.8 36.4*  MCV 98.6 100.7* 98.6  PLT 141* 141* 146*    Basic Metabolic Panel: Recent Labs  Lab 05/19/22 0405 05/20/22 1850  NA 138 138  K 4.0 3.6  CL 108 105  CO2 24 27  GLUCOSE 129* 101*  BUN 18 24*  CREATININE 0.84 0.78  CALCIUM 8.4* 8.7*    GFR: Estimated Creatinine Clearance: 82.4 mL/min (by C-G formula based on SCr of 0.78 mg/dL). Liver Function Tests: Recent Labs  Lab 05/20/22 1850 05/21/22 0329  AST 24 35  ALT 15 16  ALKPHOS 64 62  BILITOT 1.6* 2.0*  PROT 8.0 7.2  ALBUMIN 4.0 3.6    No results for input(s): "LIPASE", "AMYLASE" in the last 168 hours. Recent Labs  Lab 05/21/22 0329  AMMONIA 11    Coagulation Profile: Recent Labs  Lab 05/21/22 1035  INR 1.4*    Cardiac Enzymes: Recent Labs  Lab 05/21/22 0329  CKTOTAL 229    BNP (last 3 results) No results for input(s): "PROBNP" in the last 8760 hours. HbA1C: No results for input(s): "HGBA1C" in the last 72 hours. CBG: Recent Labs  Lab 05/18/22 1122  GLUCAP 94    Lipid Profile: No results for input(s): "CHOL", "HDL", "LDLCALC", "TRIG", "CHOLHDL", "LDLDIRECT" in the last 72 hours. Thyroid Function  Tests: No results for input(s): "TSH", "T4TOTAL", "FREET4", "T3FREE", "THYROIDAB" in the last 72 hours. Anemia Panel: No results for input(s): "VITAMINB12", "FOLATE", "FERRITIN", "TIBC", "IRON", "RETICCTPCT" in the last 72 hours. Most Recent Urinalysis On File:     Component Value Date/Time   COLORURINE YELLOW (A) 05/21/2022 0329   APPEARANCEUR CLEAR (A) 05/21/2022 0329   APPEARANCEUR Clear 03/09/2022 0817   LABSPEC >1.046 (H) 05/21/2022 0329   PHURINE 5.0 05/21/2022 0329   GLUCOSEU 150 (A) 05/21/2022 0329   HGBUR LARGE (A) 05/21/2022 0329   BILIRUBINUR NEGATIVE 05/21/2022 0329   BILIRUBINUR Negative 03/09/2022 0817   KETONESUR 5 (A) 05/21/2022 0329   PROTEINUR 30 (A) 05/21/2022 0329   NITRITE NEGATIVE 05/21/2022 0329   LEUKOCYTESUR NEGATIVE 05/21/2022 0329   Sepsis Labs: @LABRCNTIP (procalcitonin:4,lacticidven:4) Microbiology: Recent Results (from the past 240 hour(s))  MRSA Next Gen by PCR, Nasal     Status: None   Collection Time: 05/18/22 11:33 AM   Specimen: Nasal Mucosa; Nasal Swab  Result Value Ref Range Status   MRSA by PCR Next Gen NOT DETECTED NOT DETECTED Final    Comment: (NOTE) The GeneXpert MRSA Assay (FDA approved for NASAL specimens only), is one component of a comprehensive MRSA colonization surveillance program. It is not intended to diagnose MRSA infection nor to guide or monitor treatment for MRSA infections. Test performance is not FDA approved in patients less than 29 years old. Performed at [facility], [address] [place], [place] [address]   Blood culture (routine x 2)     Status: None (Preliminary result)   Collection Time: 05/20/22  6:50 PM   Specimen: BLOOD  Result Value Ref Range Status   Specimen Description BLOOD RIGHT ANTECUBITAL  Final   Special Requests   Final    BOTTLES DRAWN AEROBIC AND ANAEROBIC Blood Culture adequate volume   Culture   Final    NO GROWTH 2 DAYS Performed at [facility], [address], [place], [place] [address]    Report Status PENDING  Incomplete  Blood culture (routine x 2)     Status: None (Preliminary result)   Collection Time: 05/20/22  6:55 PM  Specimen: BLOOD  Result Value Ref Range Status   Specimen Description BLOOD LEFT ANTECUBITAL  Final   Special Requests   Final    BOTTLES DRAWN AEROBIC AND ANAEROBIC Blood Culture adequate volume   Culture   Final    NO GROWTH 2 DAYS Performed at [facility], [address] [place], [place] [address]    Report Status PENDING  Incomplete  Urine Culture     Status: None   Collection Time: 05/21/22  3:29 AM   Specimen: Urine, Suprapubic  Result Value Ref Range Status   Specimen Description   Final    URINE, SUPRAPUBIC Performed at [facility], [address] [place], [place] [address]    Special Requests   Final    NONE Performed at [facility], [address], [place], [place] [address]    Culture   Final    NO GROWTH Performed at [facility], [address] [place], [place] [address]    Report Status 05/22/2022 FINAL  Final      Radiology Studies last 3 days: MR BRAIN WO CONTRAST  Result Date: 05/21/2022 CLINICAL DATA:  Altered mental status beginning today. Worsening confusion. EXAM: MRI HEAD WITHOUT CONTRAST TECHNIQUE: Multiplanar, multiecho pulse sequences of the brain and surrounding structures were obtained without intravenous contrast. COMPARISON:  Head CT yesterday FINDINGS: Brain: Study suffers from considerable motion degradation. Diffusion imaging does not show any acute or subacute infarction. There are chronic small-vessel ischemic changes of the pons. 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 Cerebral hemispheres show volume loss with some old small vessel insults of the white matter. No cortical or large vessel territory infarction. No hemorrhage, hydrocephalus or extra-axial collection. Vascular: Major vessels at the base of the brain show flow. Skull  and upper cervical spine: Negative Sinuses/Orbits: Clear Other: None IMPRESSION: Motion degraded study. No acute finding. Chronic small-vessel ischemic changes of the pons and cerebral hemispheric white matter. 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 Electronically Signed   By: Paulina Fusi M.D.   On: 05/21/2022 14:11   CT Angio Chest PE W and/or Wo Contrast  Result Date: 05/21/2022 CLINICAL DATA:  Hypoxia EXAM: CT ANGIOGRAPHY CHEST WITH CONTRAST TECHNIQUE: Multidetector CT imaging of the chest was performed using the standard protocol during bolus administration of intravenous contrast. Multiplanar CT image reconstructions and MIPs were obtained to evaluate the vascular anatomy. RADIATION DOSE REDUCTION: This exam was performed according to the departmental dose-optimization program which includes automated exposure control, adjustment of the mA and/or kV according to patient size and/or use of iterative reconstruction technique. CONTRAST:  OMNIPAQUE IOHEXOL 350 MG/ML SOLN COMPARISON:  None Available. FINDINGS: Cardiovascular: No filling defects in the pulmonary arteries to suggest pulmonary emboli. Heart is mildly enlarged. Diffuse coronary artery and aortic atherosclerosis. No aneurysm. Mediastinum/Nodes: No mediastinal, hilar, or axillary adenopathy. Trachea and esophagus are unremarkable. Thyroid unremarkable. Lungs/Pleura: No confluent opacities or effusions. Upper Abdomen: No acute findings Musculoskeletal: Chest wall soft tissues are unremarkable. No acute bony abnormality. Review of the MIP images confirms the above findings. IMPRESSION: No evidence of pulmonary embolus. Cardiomegaly, coronary artery disease. No acute cardiopulmonary disease. Aortic Atherosclerosis (ICD10-I70.0). Electronically Signed   By: Charlett Nose M.D.   On: 05/21/2022 00:49   DG Chest Portable 1 View  Result Date: 05/20/2022 CLINICAL DATA:  Hypoxia and altered mental status EXAM: PORTABLE CHEST 1 VIEW COMPARISON:   Chest radiograph 04/18/2022 FINDINGS: Stable cardiomegaly.  Aortic atherosclerotic calcification. Increased hazy airspace  and interstitial opacities compared with 04/18/2022 with a lower lung predominant. No definite pleural effusion. No pneumothorax. No displaced rib fractures. IMPRESSION: Increased hazy airspace and interstitial opacities compared with 04/18/2022. Findings could reflect pulmonary edema or atypical infection. Electronically Signed   By: Minerva Fester M.D.   On: 05/20/2022 23:43   CT Head Wo Contrast  Result Date: 05/20/2022 CLINICAL DATA:  Altered mental status EXAM: CT HEAD WITHOUT CONTRAST TECHNIQUE: Contiguous axial images were obtained from the base of the skull through the vertex without intravenous contrast. RADIATION DOSE REDUCTION: This exam was performed according to the departmental dose-optimization program which includes automated exposure control, adjustment of the mA and/or kV according to patient size and/or use of iterative reconstruction technique. COMPARISON:  CT head 04/18/2022 FINDINGS: Brain: No intracranial hemorrhage, mass effect, or evidence of acute infarct. Unchanged ventriculomegaly. No extra-axial fluid collection. Generalized cerebral atrophy. Ill-defined hypoattenuation within the cerebral white matter is nonspecific but consistent with chronic small vessel ischemic disease. Chronic bilateral cerebellar infarcts. Vascular: No hyperdense vessel. Intracranial arterial calcification. Skull: No fracture or focal lesion. Sinuses/Orbits: No acute finding. Paranasal sinuses and mastoid air cells are well aerated. Other: None. IMPRESSION: No acute intracranial abnormality. Chronic small-vessel ischemic disease and unchanged ventriculomegaly. Electronically Signed   By: Minerva Fester M.D.   On: 05/20/2022 19:31             LOS: 1 day    Time spent: 50 min    Sunnie Nielsen, DO [facility] 05/22/2022, 12:04 PM    Dictation software may have  been used to generate the above note. Typos may occur and escape review in typed/dictated notes. Please contact Dr Lyn Hollingshead directly for clarity if needed.  Staff may message me via secure chat in Epic  but this may not receive an immediate response,  please page me for urgent matters!  If 7PM-7AM, please contact night coverage [email or web address]

## 2022-05-22 NOTE — Plan of Care (Signed)

## 2022-05-22 NOTE — Consult Note (Signed)
[facility] Face-to-Face Psychiatry Consult   Reason for Consult: Consult for 82 year old gentleman currently in the hospital after being brought back to the emergency room with altered mental status. Referring Physician: Lyn Hollingshead Patient Identification: Gary Dawson MRN:  [id number] Principal Diagnosis: Dementia with behavioral disturbance [facility]) Diagnosis:  Principal Problem:   Dementia with behavioral disturbance (HCC) Active Problems:   Permanent atrial fibrillation (HCC)   History of cardioembolic cerebrovascular accident (CVA)   Benign essential hypertension   Microscopic hematuria   Pure hypercholesterolemia   Sleep apnea in adult   Atrial fibrillation with rapid ventricular response (HCC)   OSA on CPAP   NICM (nonischemic cardiomyopathy) (HCC)   Abdominal aortic aneurysm (AAA) without rupture (HCC)   AAA (abdominal aortic aneurysm) without rupture (HCC)   Altered mental status, unspecified   Altered mental status   Total Time spent with patient: 45 minutes  Subjective:   Gary Dawson is a 82 y.o. male patient admitted with "I had a AAA".  HPI: Patient seen and chart reviewed.  Some information obtained from his friend who is at bedside.  Friend reported that the patient had gone home from his recent surgery and within a day he started to develop confusion expressing paranoid ideas such as believing that she was poisoning him became angry with an affect that was very abnormal for him.  This was the reason for bringing him back to the hospital.  She states that today she has been spending time with him and feels that he is back to baseline.  Reports that he is no longer expressing any of these psychotic like beliefs or behaviors.  The patient is only able to tell me about the aortic aneurysm but does not have a memory of coming into the hospital for this specific stay.  He tells me that he does remember being confused at home and no longer feels that way.  He says he  does not believe that his friend is trying to poison him or harm him at all and in fact gives her a lot of credit for helping to take care of him.  Patient reports his mood is feeling good.  Denies any mood symptoms.  Denies any hallucinations or psychotic symptoms.  I performed a Montreal cognitive assessment test at the bedside with the patient.  He was sitting upright in bed clearly concentrating and making an effort.  He scored 10 out of 30 which is quite impaired certainly in the range that indicate some degree of dementia.  Patient himself tells me that he has frequent memory problems.  Past Psychiatric History: Patient says he thinks that he saw a mental health provider sometime in his life but it was many years ago and he cannot remember why.  No evidence that I see in the chart of any mental health diagnoses recently.  No report of any past dangerous behavior no psychiatric medicine.  Denies history of substance abuse  Risk to Self:   Risk to Others:   Prior Inpatient Therapy:   Prior Outpatient Therapy:    Past Medical History:  Past Medical History:  Diagnosis Date   Acute on chronic HFrEF (heart failure with reduced ejection fraction) (HCC)    a.) TTE 05/18/2020: EF 45%, glob HK, mild LVH, mod reduced RVSF, mild RVE, mod LAE, sev RAE, mod MAC, triv MR, sev TR, mild-mod AoV sclerosis, (+) IAS (L-R); b.) TTE 04/19/2022: EF 30-35%, glob HK, LVH, mod RVE, PASP 40.3, mod TR, mild AR; c.) [facility]  04/20/2022: mRA 8, mPA 27, mPCWP 20, LVEDP 13, CO 4.97, CI 2.34   Aortic atherosclerosis (HCC)    Aortic root dilatation (HCC)    a.) TTE 05/18/2020: 42 mm; b.) TTE 04/19/2022: 40 mm   Atypical chest pain    a. 03/2012 St echo: nl EF, no wma's.   BPH with obstruction/lower urinary tract symptoms    CAD (coronary artery disease)    a.) R/LHC 04/20/2022: 100% RPDA, 40% D1, 30% m-dLAD - med mgmt   Calculus in bladder    Chronic prostatitis    Depression    Elevated PSA    Essential hypertension     History of kidney stones    Hyperlipidemia    Infrarenal abdominal aortic aneurysm (AAA) without rupture (HCC)    a.) CT abd/pelvis 04/18/2022: saccular infrarenal aneurysm measuring up to 5.1 cm   Long term current use of anticoagulant    a.) rivaroxaban   MVP (mitral valve prolapse)    NICM (nonischemic cardiomyopathy) (HCC)    a.) MPI 11/06/2014: EF 30-45%; b.) TTE 05/18/2020: EF 45%; c.) MPI 08/17/2020: EF 25%; d.) TTE 04/19/2022: EF 30-35%   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   a.) MRI brain 07/03/2008 - tiny nonhemorrhagic infarct LEFT posteral parietal lobe   Persistent atrial fibrillation (HCC)    a.) CHA2DS2VASc = 5 (age x2, HFrEF, HTN, vascular disease history);  b.) rate/rhythm maintained on oral digoxin + metoprolol succinate; chronically anticoagulated with rivaroxaban   Psoriasis    Reflux    Sleep apnea    a.) does not utilize nocturnal PAP therapy   Vitamin B 12 deficiency     Past Surgical History:  Procedure Laterality Date   CATARACT EXTRACTION, BILATERAL     COLONOSCOPY WITH PROPOFOL     CYSTOSCOPY WITH LITHOLAPAXY N/A 11/01/2018   Procedure: CYSTOSCOPY WITH LITHOLAPAXY;  Surgeon: [name], MD;  Location: [facility] ORS;  Service: Urology;  Laterality: N/A;   ENDOVASCULAR REPAIR/STENT GRAFT N/A 05/18/2022   Procedure: ENDOVASCULAR REPAIR/STENT GRAFT;  Surgeon: [name], MD;  Location: [facility] INVASIVE CV LAB;  Service: Cardiovascular;  Laterality: N/A;   HOLEP-LASER ENUCLEATION OF THE PROSTATE WITH MORCELLATION N/A 11/01/2018   Procedure: HOLEP-LASER ENUCLEATION OF THE PROSTATE WITH MORCELLATION;  Surgeon: [name], MD;  Location: [facility] ORS;  Service: Urology;  Laterality: N/A;   RIGHT/LEFT HEART CATH AND CORONARY ANGIOGRAPHY N/A 04/20/2022   Procedure: RIGHT/LEFT HEART CATH AND CORONARY ANGIOGRAPHY;  Surgeon: [name], MD;  Location: [facility] INVASIVE CV LAB;  Service: Cardiovascular;  Laterality: N/A;   TONSILLECTOMY     UMBILICAL HERNIA  REPAIR     VARICOSE VEIN SURGERY     Family History:  Family History  Problem Relation Age of Onset   Heart attack Father    Hypertension Father    Hyperlipidemia Father    Family Psychiatric  History: Does not know of any Social History:  Social History   Substance and Sexual Activity  Alcohol Use Yes   Alcohol/week: 1.0 standard drink of alcohol   Types: 1 Glasses of wine per week   Comment: social      Social History   Substance and Sexual Activity  Drug Use No    Social History   Socioeconomic History   Marital status: Divorced    Spouse name: Not on file   Number of children: Not on file   Years of education: Not on file   Highest education level: Not on file  Occupational History  Not on file  Tobacco Use   Smoking status: Never   Smokeless tobacco: Never  Vaping Use   Vaping Use: Never used  Substance and Sexual Activity   Alcohol use: Yes    Alcohol/week: 1.0 standard drink of alcohol    Types: 1 Glasses of wine per week    Comment: social    Drug use: No   Sexual activity: Not Currently  Other Topics Concern   Not on file  Social History Narrative   Not on file   Social Determinants of Health   Financial Resource Strain: Not on file  Food Insecurity: No Food Insecurity (05/21/2022)   Hunger Vital Sign    Worried About Running Out of Food in the Last Year: Never true    Ran Out of Food in the Last Year: Never true  Transportation Needs: No Transportation Needs (05/21/2022)   PRAPARE - Administrator, Civil Service (Medical): No    Lack of Transportation (Non-Medical): No  Physical Activity: Not on file  Stress: Not on file  Social Connections: Not on file   Additional Social History:    Allergies:   Allergies  Allergen Reactions   Other Other (See Comments)    Trees and grasses     Labs:  Results for orders placed or performed during the hospital encounter of 05/20/22 (from the past 48 hour(s))  Comprehensive metabolic  panel     Status: Abnormal   Collection Time: 05/20/22  6:50 PM  Result Value Ref Range   Sodium 138 135 - 145 mmol/L   Potassium 3.6 3.5 - 5.1 mmol/L   Chloride 105 98 - 111 mmol/L   CO2 27 22 - 32 mmol/L   Glucose, Bld 101 (H) 70 - 99 mg/dL    Comment: Glucose reference range applies only to samples taken after fasting for at least 8 hours.   BUN 24 (H) 8 - 23 mg/dL   Creatinine, Ser 4.09 0.61 - 1.24 mg/dL   Calcium 8.7 (L) 8.9 - 10.3 mg/dL   Total Protein 8.0 6.5 - 8.1 g/dL   Albumin 4.0 3.5 - 5.0 g/dL   AST 24 15 - 41 U/L   ALT 15 0 - 44 U/L   Alkaline Phosphatase 64 38 - 126 U/L   Total Bilirubin 1.6 (H) 0.3 - 1.2 mg/dL   GFR, Estimated >81 >19 mL/min    Comment: (NOTE) Calculated using the CKD-EPI Creatinine Equation (2021)    Anion gap 6 5 - 15    Comment: Performed at [facility], [address] [place], [place] [address]  CBC with Differential     Status: Abnormal   Collection Time: 05/20/22  6:50 PM  Result Value Ref Range   WBC 10.2 4.0 - 10.5 K/uL   RBC 4.05 (L) 4.22 - 5.81 MIL/uL   Hemoglobin 13.6 13.0 - 17.0 g/dL   HCT 95.6 21.3 - 08.6 %   MCV 100.7 (H) 80.0 - 100.0 fL   MCH 33.6 26.0 - 34.0 pg   MCHC 33.3 30.0 - 36.0 g/dL   RDW 57.8 46.9 - 62.9 %   Platelets 141 (L) 150 - 400 K/uL   nRBC 0.0 0.0 - 0.2 %   Neutrophils Relative % 76 %   Neutro Abs 7.6 1.7 - 7.7 K/uL   Lymphocytes Relative 10 %   Lymphs Abs 1.1 0.7 - 4.0 K/uL   Monocytes Relative 14 %   Monocytes Absolute 1.5 (H) 0.1 - 1.0 K/uL  Eosinophils Relative 0 %   Eosinophils Absolute 0.0 0.0 - 0.5 K/uL   Basophils Relative 0 %   Basophils Absolute 0.0 0.0 - 0.1 K/uL   Immature Granulocytes 0 %   Abs Immature Granulocytes 0.03 0.00 - 0.07 K/uL    Comment: Performed at [facility], [address], [place], [place] [address]  Troponin I (High Sensitivity)     Status: Abnormal   Collection Time: 05/20/22  6:50 PM  Result Value Ref Range   Troponin I (High Sensitivity) 21  (H) <18 ng/L    Comment: (NOTE) Elevated high sensitivity troponin I (hsTnI) values and significant  changes across serial measurements may suggest ACS but many other  chronic and acute conditions are known to elevate hsTnI results.  Refer to the "Links" section for chest pain algorithms and additional  guidance. Performed at [facility], [address] [place], [place] [address]   Lactic acid, plasma     Status: None   Collection Time: 05/20/22  6:50 PM  Result Value Ref Range   Lactic Acid, Venous 1.4 0.5 - 1.9 mmol/L    Comment: Performed at [facility], [address] [place], [place] [address]  Blood culture (routine x 2)     Status: None (Preliminary result)   Collection Time: 05/20/22  6:50 PM   Specimen: BLOOD  Result Value Ref Range   Specimen Description BLOOD RIGHT ANTECUBITAL    Special Requests      BOTTLES DRAWN AEROBIC AND ANAEROBIC Blood Culture adequate volume   Culture      NO GROWTH 2 DAYS Performed at [facility], [address] [place], [place] [address]    Report Status PENDING   Blood culture (routine x 2)     Status: None (Preliminary result)   Collection Time: 05/20/22  6:55 PM   Specimen: BLOOD  Result Value Ref Range   Specimen Description BLOOD LEFT ANTECUBITAL    Special Requests      BOTTLES DRAWN AEROBIC AND ANAEROBIC Blood Culture adequate volume   Culture      NO GROWTH 2 DAYS Performed at [facility], [address], [place], [place] [address]    Report Status PENDING   Procalcitonin     Status: None   Collection Time: 05/20/22  9:42 PM  Result Value Ref Range   Procalcitonin <0.10 ng/mL    Comment:        Interpretation: PCT (Procalcitonin) <= 0.5 ng/mL: Systemic infection (sepsis) is not likely. Local bacterial infection is possible. (NOTE)       Sepsis PCT Algorithm           Lower Respiratory Tract                                      Infection PCT Algorithm     ----------------------------     ----------------------------         PCT < 0.25 ng/mL                PCT < 0.10 ng/mL          Strongly encourage             Strongly discourage   discontinuation of antibiotics    initiation of antibiotics    ----------------------------     -----------------------------       PCT 0.25 - 0.50 ng/mL  PCT 0.10 - 0.25 ng/mL               OR       >80% decrease in PCT            Discourage initiation of                                            antibiotics      Encourage discontinuation           of antibiotics    ----------------------------     -----------------------------         PCT >= 0.50 ng/mL              PCT 0.26 - 0.50 ng/mL               AND        <80% decrease in PCT             Encourage initiation of                                             antibiotics       Encourage continuation           of antibiotics    ----------------------------     -----------------------------        PCT >= 0.50 ng/mL                  PCT > 0.50 ng/mL               AND         increase in PCT                  Strongly encourage                                      initiation of antibiotics    Strongly encourage escalation           of antibiotics                                     -----------------------------                                           PCT <= 0.25 ng/mL                                                 OR                                        > 80% decrease in PCT  Discontinue / Do not initiate                                             antibiotics  Performed at [facility], [address] [place], [place] [address]   Lactic acid, plasma     Status: None   Collection Time: 05/20/22  9:43 PM  Result Value Ref Range   Lactic Acid, Venous 1.2 0.5 - 1.9 mmol/L    Comment: Performed at [facility], [address] [place], [place] [address]  Troponin I (High  Sensitivity)     Status: Abnormal   Collection Time: 05/20/22  9:43 PM  Result Value Ref Range   Troponin I (High Sensitivity) 22 (H) <18 ng/L    Comment: (NOTE) Elevated high sensitivity troponin I (hsTnI) values and significant  changes across serial measurements may suggest ACS but many other  chronic and acute conditions are known to elevate hsTnI results.  Refer to the "Links" section for chest pain algorithms and additional  guidance. Performed at [facility], [address] [place], [place] [address]   Urinalysis, Routine w reflex microscopic -Urine, Clean Catch     Status: Abnormal   Collection Time: 05/21/22  3:29 AM  Result Value Ref Range   Color, Urine YELLOW (A) YELLOW   APPearance CLEAR (A) CLEAR   Specific Gravity, Urine >1.046 (H) 1.005 - 1.030   pH 5.0 5.0 - 8.0   Glucose, UA 150 (A) NEGATIVE mg/dL   Hgb urine dipstick LARGE (A) NEGATIVE   Bilirubin Urine NEGATIVE NEGATIVE   Ketones, ur 5 (A) NEGATIVE mg/dL   Protein, ur 30 (A) NEGATIVE mg/dL   Nitrite NEGATIVE NEGATIVE   Leukocytes,Ua NEGATIVE NEGATIVE   RBC / HPF >50 0 - 5 RBC/hpf   WBC, UA 6-10 0 - 5 WBC/hpf   Bacteria, UA NONE SEEN NONE SEEN   Squamous Epithelial / HPF 0-5 0 - 5 /HPF   Mucus PRESENT     Comment: Performed at [facility], [address] [place], [place] [address]  Type and screen [facility]     Status: None   Collection Time: 05/21/22  3:29 AM  Result Value Ref Range   ABO/RH(D) O POS    Antibody Screen NEG    Sample Expiration      05/24/2022,2359 Performed at [facility], [address] [place], [place] [address]   Brain natriuretic peptide     Status: Abnormal   Collection Time: 05/21/22  3:29 AM  Result Value Ref Range   B Natriuretic Peptide 318.1 (H) 0.0 - 100.0 pg/mL    Comment: Performed at [facility], [address] [place], [place] [address]  Blood gas, venous     Status: Abnormal (Preliminary result)    Collection Time: 05/21/22  3:29 AM  Result Value Ref Range   pH, Ven 7.4 7.25 - 7.43   pCO2, Ven 46 44 - 60 mmHg   pO2, Ven 39 32 - 45 mmHg   Bicarbonate 28.5 (H) 20.0 - 28.0 mmol/L   Acid-Base Excess 3.0 (H) 0.0 - 2.0 mmol/L   O2 Saturation 65.3 %   Patient temperature 37.0    Collection site VENOUS     Comment: Performed at [facility], [address], [place], [place] [address]   Drawn by PENDING  Lactic acid, plasma     Status: None   Collection Time: 05/21/22  3:29 AM  Result Value Ref Range   Lactic Acid, Venous 1.9 0.5 - 1.9 mmol/L    Comment: Performed at [facility], [address] [place], [place] [address]  Ammonia     Status: None   Collection Time: 05/21/22  3:29 AM  Result Value Ref Range   Ammonia 11 9 - 35 umol/L    Comment: Performed at [facility], [address] [place], [place] [address]  Hepatic function panel     Status: Abnormal   Collection Time: 05/21/22  3:29 AM  Result Value Ref Range   Total Protein 7.2 6.5 - 8.1 g/dL   Albumin 3.6 3.5 - 5.0 g/dL   AST 35 15 - 41 U/L   ALT 16 0 - 44 U/L   Alkaline Phosphatase 62 38 - 126 U/L   Total Bilirubin 2.0 (H) 0.3 - 1.2 mg/dL   Bilirubin, Direct 0.4 (H) 0.0 - 0.2 mg/dL   Indirect Bilirubin 1.6 (H) 0.3 - 0.9 mg/dL    Comment: Performed at [facility], [address] [place], [place] [address]  CK     Status: None   Collection Time: 05/21/22  3:29 AM  Result Value Ref Range   Total CK 229 49 - 397 U/L    Comment: Performed at [facility], [address] [place], [place] [address]  Sedimentation rate     Status: Abnormal   Collection Time: 05/21/22  3:29 AM  Result Value Ref Range   Sed Rate 54 (H) 0 - 20 mm/hr    Comment: Performed at [facility], [address] [place], [place] [address]  Urine Culture     Status: None   Collection Time: 05/21/22  3:29 AM   Specimen: Urine, Suprapubic  Result Value Ref Range   Specimen Description       URINE, SUPRAPUBIC Performed at [facility], [address] [place], [place] [address]    Special Requests      NONE Performed at [facility], [address] [place], [place] [address]    Culture      NO GROWTH Performed at [facility], [address] [place], [place] [address]    Report Status 05/22/2022 FINAL   Lactic acid, plasma     Status: None   Collection Time: 05/21/22 10:26 AM  Result Value Ref Range   Lactic Acid, Venous 1.5 0.5 - 1.9 mmol/L    Comment: Performed at [facility], [address] [place], [place] [address]  CBC     Status: Abnormal   Collection Time: 05/21/22 10:35 AM  Result Value Ref Range   WBC 8.9 4.0 - 10.5 K/uL   RBC 3.69 (L) 4.22 - 5.81 MIL/uL   Hemoglobin 12.2 (L) 13.0 - 17.0 g/dL   HCT 53.6 (L) 64.4 - 03.4 %   MCV 98.6 80.0 - 100.0 fL   MCH 33.1 26.0 - 34.0 pg   MCHC 33.5 30.0 - 36.0 g/dL   RDW 74.2 59.5 - 63.8 %   Platelets 146 (L) 150 - 400 K/uL   nRBC 0.0 0.0 - 0.2 %    Comment: Performed at [facility], [address] [place], [place] [address]  APTT     Status: Abnormal   Collection Time: 05/21/22 10:35 AM  Result Value Ref Range   aPTT 60 (H) 24 - 36  seconds    Comment:        IF BASELINE aPTT IS ELEVATED, SUGGEST PATIENT RISK ASSESSMENT BE USED TO DETERMINE APPROPRIATE ANTICOAGULANT THERAPY. Performed at [facility], [address] [place], [place] [address]   Protime-INR     Status: Abnormal   Collection Time: 05/21/22 10:35 AM  Result Value Ref Range   Prothrombin Time 16.7 (H) 11.4 - 15.2 seconds   INR 1.4 (H) 0.8 - 1.2    Comment: (NOTE) INR goal varies based on device and disease states. Performed at [facility], [address] [place], [place] [address]   Heparin level (unfractionated)     Status: None   Collection Time: 05/21/22 10:35 AM  Result Value Ref Range   Heparin Unfractionated 0.39 0.30 - 0.70 IU/mL    Comment: (NOTE) The  clinical reportable range upper limit is being lowered to >1.10 to align with the FDA approved guidance for the current laboratory assay.  If heparin results are below expected values, and patient dosage has  been confirmed, suggest follow up testing of antithrombin III levels. Performed at [facility], [address] [place], [place] [address]     Current Facility-Administered Medications  Medication Dose Route Frequency Provider Last Rate Last Admin   acetaminophen (TYLENOL) tablet 650 mg  650 mg Oral Q6H PRN Gertha Calkin, MD       Or   acetaminophen (TYLENOL) suppository 650 mg  650 mg Rectal Q6H PRN Gertha Calkin, MD       atorvastatin (LIPITOR) tablet 40 mg  40 mg Oral Daily Sunnie Nielsen, DO   40 mg at 05/21/22 1959   dapagliflozin propanediol (FARXIGA) tablet 10 mg  10 mg Oral Daily Sunnie Nielsen, DO   10 mg at 05/22/22 1033   digoxin (LANOXIN) tablet 0.25 mg  0.25 mg Oral Tyrone Schimke, Dorene Grebe, DO   0.25 mg at 05/22/22 1032   diltiazem (CARDIZEM CD) 24 hr capsule 120 mg  120 mg Oral Daily Sunnie Nielsen, DO   120 mg at 05/22/22 1032   metoprolol succinate (TOPROL-XL) 24 hr tablet 50 mg  50 mg Oral BID WC Sunnie Nielsen, DO   50 mg at 05/22/22 1032   oxybutynin (DITROPAN-XL) 24 hr tablet 5 mg  5 mg Oral QHS Sunnie Nielsen, DO   5 mg at 05/21/22 1959   rivaroxaban (XARELTO) tablet 20 mg  20 mg Oral Q supper Sunnie Nielsen, DO   20 mg at 05/21/22 1754   sodium chloride flush (NS) 0.9 % injection 3 mL  3 mL Intravenous Q12H Gertha Calkin, MD   3 mL at 05/21/22 2049    Musculoskeletal: Strength & Muscle Tone: within normal limits Gait & Station:  Not evaluated Patient leans: N/A            Psychiatric Specialty Exam:  Presentation  General Appearance: No data recorded Eye Contact:No data recorded Speech:No data recorded Speech Volume:No data recorded Handedness:No data recorded  Mood and Affect  Mood:No data  recorded Affect:No data recorded  Thought Process  Thought Processes:No data recorded Descriptions of Associations:No data recorded Orientation:No data recorded Thought Content:No data recorded History of Schizophrenia/Schizoaffective disorder:No data recorded Duration of Psychotic Symptoms:No data recorded Hallucinations:No data recorded Ideas of Reference:No data recorded Suicidal Thoughts:No data recorded Homicidal Thoughts:No data recorded  Sensorium  Memory:No data recorded Judgment:No data recorded Insight:No data recorded  Executive Functions  Concentration:No data recorded Attention Span:No data recorded Recall:No data recorded Fund of Knowledge:No  data recorded Language:No data recorded  Psychomotor Activity  Psychomotor Activity:No data recorded  Assets  Assets:No data recorded  Sleep  Sleep:No data recorded  Physical Exam: Physical Exam Vitals and nursing note reviewed.  Constitutional:      Appearance: Normal appearance.  HENT:     Head: Normocephalic and atraumatic.     Mouth/Throat:     Pharynx: Oropharynx is clear.  Eyes:     Pupils: Pupils are equal, round, and reactive to light.  Cardiovascular:     Rate and Rhythm: Normal rate and regular rhythm.  Pulmonary:     Effort: Pulmonary effort is normal.     Breath sounds: Normal breath sounds.  Abdominal:     General: Abdomen is flat.     Palpations: Abdomen is soft.  Musculoskeletal:        General: Normal range of motion.  Skin:    General: Skin is warm and dry.  Neurological:     General: No focal deficit present.     Mental Status: He is alert. Mental status is at baseline.  Psychiatric:        Attention and Perception: Attention normal.        Mood and Affect: Mood normal.        Speech: Speech normal.        Behavior: Behavior normal. Behavior is cooperative.        Thought Content: Thought content normal.        Cognition and Memory: Cognition is impaired. Memory is impaired.     Review of Systems  Constitutional: Negative.   HENT: Negative.    Eyes: Negative.   Respiratory: Negative.    Cardiovascular: Negative.   Gastrointestinal: Negative.   Musculoskeletal: Negative.   Skin: Negative.   Neurological: Negative.   Psychiatric/Behavioral:  Positive for memory loss. Negative for depression, hallucinations, substance abuse and suicidal ideas. The patient is not nervous/anxious and does not have insomnia.    Blood pressure 114/74, pulse 90, temperature 97.7 F (36.5 C), resp. rate 17, height 6\' 1"  (1.854 m), weight 79.1 kg, SpO2 100 %. Body mass index is 23.01 kg/m.  Treatment Plan Summary: Plan 82 year old gentleman who presented with altered mental status.  From what I am being told his symptoms have now resolved.  Friends who are with him state they believe he is back to his baseline.  Patient has evidence strongly suggesting dementia.  Most likely of the Alzheimer's type could be some component of vascular as well.  The altered mental status when he came into the hospital is not unusual for a patient with dementia after surgery.  At this point there is no indication to start any psychiatric medicine however he remains at risk for similar incidents in the future and every effort should be made to keep an eye on this and help him with transitioning back home.  Given that he is living alone it is possible that there may come a time in the future possibly near future that he would benefit from moving to an assisted living type facility.  Disposition: Patient does not meet criteria for psychiatric inpatient admission. Supportive therapy provided about ongoing stressors. Discussed crisis plan, support from social network, calling 911, coming to the Emergency Department, and calling Suicide Hotline.  Mordecai Rasmussen, MD 05/22/2022 4:50 PM

## 2022-05-22 NOTE — Evaluation (Signed)
Physical Therapy Evaluation Patient Details Name: Gary Dawson MRN: [id number] DOB: 01-Oct-1940 Today's Date: 05/22/2022  History of Present Illness  Pt is an 82 y.o. male presenting to hospital 05/20/22 with concerns for AMS; recent hospitalization for AAA repair 2 days prior.  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  PMH includes acute on chronic HFrEF, atypical chest pain, htn, AAA without rupture, MVP, parietal lobe infraction, a-fib, sleep apnea.  Clinical Impression  Prior to recent medical concerns, pt was modified independent ambulating with SPC; lives alone on main level of home with 2 STE B railings; pt's significant other lives 1 block away and can provide 24/7 assist upon discharge (also has support from neighbors).  Pt oriented to person, place, and some of situation; intermittent confusion and difficulty following cues noted during session.  Currently pt is SBA with bed mobility; CGA to min assist with transfers; and min assist (progressing to CGA) ambulating 200 feet with RW use (pt unsteady with trial using SPC so switched to RW use).  Altered gait mechanics noted (pt's significant other reports shuffling gait is baseline and will bring in his shoes to assist with walking mechanics).  Pt would currently benefit from skilled PT to address noted impairments and functional limitations (see below for any additional details).  Upon hospital discharge, pt would benefit from ongoing therapy (pt and pt's significant other want pt to discharge home with HHPT: therapist educated both that pt would require 24/7 assist for safety and pt's significant other reports she is able to provide this).    Recommendations for follow up therapy are one component of a multi-disciplinary discharge planning process, led by the attending physician.  Recommendations may be updated based on patient status, additional functional criteria and insurance  authorization.        Assistance Recommended at Discharge Frequent or constant Supervision/Assistance  Patient can return home with the following  A little help with walking and/or transfers;A little help with bathing/dressing/bathroom;Assistance with cooking/housework;Direct supervision/assist for medications management;Assist for transportation;Help with stairs or ramp for entrance    Equipment Recommendations Rolling walker (2 wheels)  Recommendations for Other Services  OT consult    Functional Status Assessment Patient has had a recent decline in their functional status and demonstrates the ability to make significant improvements in function in a reasonable and predictable amount of time.     Precautions / Restrictions Precautions Precautions: Fall Restrictions Weight Bearing Restrictions: No      Mobility  Bed Mobility Overal bed mobility: Needs Assistance Bed Mobility: Supine to Sit, Sit to Supine     Supine to sit: Supervision, HOB elevated Sit to supine: Supervision, HOB elevated   General bed mobility comments: mild increased effort/time to perform on own    Transfers Overall transfer level: Needs assistance Equipment used:  (single UE support on bed rail) Transfers: Sit to/from Stand Sit to Stand: Min guard, Min assist           General transfer comment: x2 trials standing from bed; vc's for squaring up hips prior to standing; assist to steady initially without UE support    Ambulation/Gait Ambulation/Gait assistance: Min assist, Min guard Gait Distance (Feet): 200 Feet Assistive device: Rolling walker (2 wheels)   Gait velocity: decreased     General Gait Details: partial step through gait pattern; R foot appearing overpronated compared to L and B knees touching at times  Stairs  Wheelchair Mobility    Modified Rankin (Stroke Patients Only)       Balance Overall balance assessment: Needs assistance Sitting-balance  support: No upper extremity supported, Feet supported Sitting balance-Leahy Scale: Good Sitting balance - Comments: steady sitting reaching within BOS   Standing balance support: Bilateral upper extremity supported, During functional activity Standing balance-Leahy Scale: Poor Standing balance comment: initially min assist for ambulation to steady but progressed to CGA (using RW)                             Pertinent Vitals/Pain Pain Assessment Pain Assessment: No/denies pain Vitals (HR and O2 on room air) stable and WFL throughout treatment session.    Home Living Family/patient expects to be discharged to:: Private residence Living Arrangements: Alone Available Help at Discharge: Friend(s);Neighbor;Available 24 hours/day;Available PRN/intermittently Type of Home: House Home Access: Stairs to enter Entrance Stairs-Rails: Right;Left;Can reach both Entrance Stairs-Number of Steps: 2 (garage) Alternate Level Stairs-Number of Steps: flight Home Layout: Two level;Able to live on main level with bedroom/bathroom Home Equipment: Cane - single point;Grab bars - tub/shower;Grab bars - toilet Additional Comments: Pt's significant other lives 1 block away and has recently been providing almost 24/7 assist d/t pt's recent needs.    Prior Function Prior Level of Function : Independent/Modified Independent;History of Falls (last six months)             Mobility Comments: Ues SPC at baseline (tends to shuffle and R foot overpronating baseline more than L). ADLs Comments: Pt has recently been wearing depends just in case.     Hand Dominance   Dominant Hand: Right    Extremity/Trunk Assessment   Upper Extremity Assessment Upper Extremity Assessment: Overall WFL for tasks assessed    Lower Extremity Assessment Lower Extremity Assessment: Generalized weakness       Communication   Communication: No difficulties  Cognition Arousal/Alertness: Awake/alert Behavior  During Therapy: WFL for tasks assessed/performed, Impulsive Overall Cognitive Status: Impaired/Different from baseline Area of Impairment: Orientation, Following commands, Safety/judgement                 Orientation Level: Disoriented to, Time, Situation     Following Commands: Follows one step commands inconsistently Safety/Judgement: Decreased awareness of safety, Decreased awareness of deficits     General Comments: Pt reports being at [facility] although initially confused regarding the question.        General Comments  Nursing cleared pt for participation in physical therapy.  Pt agreeable to PT session.  Pt's significant other present during session.    Exercises     Assessment/Plan    PT Assessment Patient needs continued PT services  PT Problem List Decreased strength;Decreased balance;Decreased mobility;Decreased activity tolerance;Decreased cognition;Decreased knowledge of use of DME;Decreased safety awareness;Decreased knowledge of precautions       PT Treatment Interventions DME instruction;Gait training;Stair training;Functional mobility training;Therapeutic activities;Therapeutic exercise;Balance training;Patient/family education    PT Goals (Current goals can be found in the Care Plan section)  Acute Rehab PT Goals Patient Stated Goal: to improve mobility and balance PT Goal Formulation: With patient Time For Goal Achievement: 06/05/22 Potential to Achieve Goals: Good    Frequency Min 3X/week     Co-evaluation               AM-PAC PT "6 Clicks" Mobility  Outcome Measure Help needed turning from your back to your side while in a flat bed without using bedrails?: None Help needed moving  from lying on your back to sitting on the side of a flat bed without using bedrails?: A Little Help needed moving to and from a bed to a chair (including a wheelchair)?: A Little Help needed standing up from a chair using your arms (e.g., wheelchair or bedside  chair)?: A Little Help needed to walk in hospital room?: A Little Help needed climbing 3-5 steps with a railing? : A Little 6 Click Score: 19    End of Session Equipment Utilized During Treatment: Gait belt Activity Tolerance: Patient tolerated treatment well Patient left: in bed;with call bell/phone within reach;with bed alarm set;with nursing/sitter in room;with family/visitor present;Other (comment) (Sitter and pt's significant other present) Nurse Communication: Mobility status;Precautions PT Visit Diagnosis: Unsteadiness on feet (R26.81);Muscle weakness (generalized) (M62.81);History of falling (Z91.81);Other abnormalities of gait and mobility (R26.89)    Time: 1610-9604 PT Time Calculation (min) (ACUTE ONLY): 32 min   Charges:   PT Evaluation $PT Eval Low Complexity: 1 Low PT Treatments $Gait Training: 8-22 mins       Hendricks Limes, PT 05/22/22, 4:12 PM

## 2022-05-22 NOTE — Evaluation (Addendum)
Occupational Therapy Evaluation Patient Details Name: Gary Dawson MRN: [id number] DOB: [birth year] Today's Date: 05/22/2022   History of Present Illness Gary Dawson is a 82 y.o. 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 To ED via EMS from home with sudden onset of AMS with delusions and aggression. Pt was discharged from hospital 2 days prior following AAA repair (Drs Wyn Quaker and [employer]).   Clinical Impression   [name] was seen for OT evaluation this date. Prior to hospital admission, pt was MOD I using SPC for mobility and ADLs. Pt lives in home c 2 STE and a renter per chart review. Pt presents to acute OT demonstrating impaired ADL performance and functional mobility 2/2 decreased activity tolerance and functional strength/ROM/balance deficits. Pt oriented to self only, states location as church and current year is 37  (states birth year 17).  Pt currently requires MIN A don B socks seated in chair. MIN A + SPC improving to CGA + RW for toilet t/f. Cues t/o for safety. Pt would benefit from skilled OT to address noted impairments and functional limitations (see below for any additional details). Upon hospital discharge, recommend follow up therapy <3hours/day.   Recommendations for follow up therapy are one component of a multi-disciplinary discharge planning process, led by the attending physician.  Recommendations may be updated based on patient status, additional functional criteria and insurance authorization.   Assistance Recommended at Discharge Frequent or constant Supervision/Assistance  Patient can return home with the following A little help with walking and/or transfers;A lot of help with bathing/dressing/bathroom;Help with stairs or ramp for entrance    Functional Status Assessment  Patient has had a recent decline in their functional status and demonstrates the ability to make significant improvements in  function in a reasonable and predictable amount of time.  Equipment Recommendations  Other (comment) (defer to next venue of care)    Recommendations for Other Services       Precautions / Restrictions Precautions Precautions: Fall Restrictions Weight Bearing Restrictions: No      Mobility Bed Mobility Overal bed mobility: Needs Assistance Bed Mobility: Supine to Sit     Supine to sit: Min guard          Transfers Overall transfer level: Needs assistance Equipment used: Straight cane Transfers: Sit to/from Stand Sit to Stand: Min assist, +2 safety/equipment                  Balance Overall balance assessment: Needs assistance Sitting-balance support: No upper extremity supported, Feet supported Sitting balance-Leahy Scale: Good     Standing balance support: No upper extremity supported Standing balance-Leahy Scale: Poor                             ADL either performed or assessed with clinical judgement   ADL Overall ADL's : Needs assistance/impaired                                       General ADL Comments: MIN A don B socks seated in chair. MIN A + SPC improving to CGA + RW for toilet t/f. Cues t/o for safety      Pertinent Vitals/Pain Pain Assessment Pain Assessment: No/denies pain     Hand Dominance Right   Extremity/Trunk Assessment Upper Extremity Assessment Upper  Extremity Assessment: Overall WFL for tasks assessed   Lower Extremity Assessment Lower Extremity Assessment: Generalized weakness       Communication Communication Communication: No difficulties   Cognition Arousal/Alertness: Awake/alert Behavior During Therapy: WFL for tasks assessed/performed, Impulsive Overall Cognitive Status: Impaired/Different from baseline Area of Impairment: Orientation, Following commands, Safety/judgement                 Orientation Level: Disoriented to, Place, Time, Situation     Following Commands:  Follows one step commands consistently Safety/Judgement: Decreased awareness of safety, Decreased awareness of deficits     General Comments: Orientation log 14/30. States we are in a church and current year is 67  (states burth year 67).                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 Uses SPC baseline          OT Problem List: Decreased strength;Decreased range of motion;Decreased activity tolerance;Impaired balance (sitting and/or standing);Decreased safety awareness;Decreased cognition      OT Treatment/Interventions: Self-care/ADL training;Therapeutic exercise;Energy conservation;DME and/or AE instruction;Therapeutic activities;Patient/family education;Balance training    OT Goals(Current goals can be found in the care plan section) Acute Rehab OT Goals Patient Stated Goal: to go home OT Goal Formulation: With patient Time For Goal Achievement: 06/05/22 Potential to Achieve Goals: Fair ADL Goals Pt Will Perform Grooming: with modified independence;standing (tolerate >10 mins) Pt Will Perform Lower Body Dressing: sit to/from stand;with modified independence Pt Will Transfer to Toilet: with modified independence;ambulating;regular height toilet  OT Frequency: Min  1X/week    Co-evaluation              AM-PAC OT "6 Clicks" Daily Activity     Outcome Measure Help from another person eating meals?: None Help from another person taking care of personal grooming?: A Little Help from another person toileting, which includes using toliet, bedpan, or urinal?: A Lot Help from another person bathing (including washing, rinsing, drying)?: A Lot Help from another person to put on and taking off regular upper body clothing?: A Little Help from another person to put on and taking off regular lower body clothing?: A Little 6 Click Score: 17   End of Session Equipment Utilized During Treatment: Rolling walker (2 wheels);Gait belt Nurse Communication: Mobility status  Activity Tolerance: Patient tolerated treatment well Patient left: in chair;with call bell/phone within reach;with nursing/sitter in room  OT Visit Diagnosis: Other abnormalities of gait and mobility (R26.89);Muscle weakness (generalized) (M62.81)                Time: 4098-1191 OT Time Calculation (min): 24 min Charges:  OT General Charges $OT Visit: 1 Visit OT Evaluation $OT Eval Moderate Complexity: 1 Mod OT Treatments $Self Care/Home Management : 8-22 mins  Kathie Dike, M.S. OTR/L  05/22/22, 1:19 PM  ascom [phone]

## 2022-05-23 DIAGNOSIS — F03918 Unspecified dementia, unspecified severity, with other behavioral disturbance: Secondary | ICD-10-CM | POA: Diagnosis not present

## 2022-05-23 NOTE — Plan of Care (Signed)
  Problem: Education: Goal: Knowledge of discharge needs will improve 05/23/2022 0855 by Leonie Douglas, RN Outcome: Progressing 05/23/2022 0855 by Leonie Douglas, RN Outcome: Progressing   Problem: Clinical Measurements: Goal: Postoperative complications will be avoided or minimized 05/23/2022 0855 by Leonie Douglas, RN Outcome: Progressing 05/23/2022 0855 by Leonie Douglas, RN Outcome: Progressing   Problem: Respiratory: Goal: Ability to achieve and maintain a regular respiratory rate will improve 05/23/2022 0855 by Leonie Douglas, RN Outcome: Progressing 05/23/2022 0855 by Leonie Douglas, RN Outcome: Progressing   Problem: Skin Integrity: Goal: Demonstration of wound healing without infection will improve 05/23/2022 0855 by Leonie Douglas, RN Outcome: Progressing 05/23/2022 0855 by Leonie Douglas, RN Outcome: Progressing   Problem: Education: Goal: Knowledge of General Education information will improve Description: Including pain rating scale, medication(s)/side effects and non-pharmacologic comfort measures 05/23/2022 0855 by Leonie Douglas, RN Outcome: Progressing 05/23/2022 0855 by Leonie Douglas, RN Outcome: Progressing   Problem: Health Behavior/Discharge Planning: Goal: Ability to manage health-related needs will improve 05/23/2022 0855 by Leonie Douglas, RN Outcome: Progressing 05/23/2022 0855 by Leonie Douglas, RN Outcome: Progressing   Problem: Clinical Measurements: Goal: Ability to maintain clinical measurements within normal limits will improve 05/23/2022 0855 by Leonie Douglas, RN Outcome: Progressing 05/23/2022 0855 by Leonie Douglas, RN Outcome: Progressing Goal: Will remain free from infection 05/23/2022 0855 by Leonie Douglas, RN Outcome: Progressing 05/23/2022 0855 by Leonie Douglas, RN Outcome: Progressing Goal: Diagnostic test results will improve 05/23/2022 0855 by Leonie Douglas, RN Outcome:  Progressing 05/23/2022 0855 by Leonie Douglas, RN Outcome: Progressing Goal: Respiratory complications will improve 05/23/2022 0855 by Leonie Douglas, RN Outcome: Progressing 05/23/2022 0855 by Leonie Douglas, RN Outcome: Progressing Goal: Cardiovascular complication will be avoided 05/23/2022 0855 by Leonie Douglas, RN Outcome: Progressing 05/23/2022 0855 by Leonie Douglas, RN Outcome: Progressing   Problem: Activity: Goal: Risk for activity intolerance will decrease 05/23/2022 0855 by Leonie Douglas, RN Outcome: Progressing 05/23/2022 0855 by Leonie Douglas, RN Outcome: Progressing   Problem: Nutrition: Goal: Adequate nutrition will be maintained 05/23/2022 0855 by Leonie Douglas, RN Outcome: Progressing 05/23/2022 0855 by Leonie Douglas, RN Outcome: Progressing   Problem: Coping: Goal: Level of anxiety will decrease 05/23/2022 0855 by Leonie Douglas, RN Outcome: Progressing 05/23/2022 0855 by Leonie Douglas, RN Outcome: Progressing   Problem: Elimination: Goal: Will not experience complications related to bowel motility 05/23/2022 0855 by Leonie Douglas, RN Outcome: Progressing 05/23/2022 0855 by Leonie Douglas, RN Outcome: Progressing Goal: Will not experience complications related to urinary retention 05/23/2022 0855 by Leonie Douglas, RN Outcome: Progressing 05/23/2022 0855 by Leonie Douglas, RN Outcome: Progressing   Problem: Pain Managment: Goal: General experience of comfort will improve 05/23/2022 0855 by Leonie Douglas, RN Outcome: Progressing 05/23/2022 0855 by Leonie Douglas, RN Outcome: Progressing   Problem: Safety: Goal: Ability to remain free from injury will improve 05/23/2022 0855 by Leonie Douglas, RN Outcome: Progressing 05/23/2022 0855 by Leonie Douglas, RN Outcome: Progressing   Problem: Skin Integrity: Goal: Risk for impaired skin integrity will decrease 05/23/2022 0855 by Leonie Douglas,  RN Outcome: Progressing 05/23/2022 0855 by Leonie Douglas, RN Outcome: Progressing

## 2022-05-23 NOTE — Progress Notes (Signed)
Physical Therapy Treatment Patient Details Name: Gary Dawson MRN: [id number] DOB: [birth year] Today's Date: 05/23/2022   History of Present Illness Pt is an 82 y.o. male presenting to hospital 05/20/22 with concerns for AMS; recent hospitalization for AAA repair 2 days prior.  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  PMH includes acute on chronic HFrEF, atypical chest pain, htn, AAA without rupture, MVP, parietal lobe infraction, a-fib, sleep apnea.    PT Comments    Pt resting in recliner upon PT arrival; pt agreeable to therapy.  Pt's shoes and brief donned for session.  No c/o pain.  During session pt CGA to SBA with transfers using RW; CGA to SBA ambulating 160 feet x2 with RW use; and CGA to navigate 4 steps with B railings.  Pt's significant other intermittently giving pt cues for upright posture during ambulation.  Pt demonstrating intermittent confusion during session (oriented to person and hospital and some of situation).  Continue to recommend 24/7 assist at discharge for safe home discharge (pt's significant other reports she is able to provide this).    Recommendations for follow up therapy are one component of a multi-disciplinary discharge planning process, led by the attending physician.  Recommendations may be updated based on patient status, additional functional criteria and insurance authorization.        Assistance Recommended at Discharge Frequent or constant Supervision/Assistance  Patient can return home with the following A little help with walking and/or transfers;A little help with bathing/dressing/bathroom;Assistance with cooking/housework;Direct supervision/assist for medications management;Assist for transportation;Help with stairs or ramp for entrance   Equipment Recommendations  Rolling walker (2 wheels)    Recommendations for Other Services       Precautions / Restrictions Precautions Precautions:  Fall Restrictions Weight Bearing Restrictions: No     Mobility  Bed Mobility               General bed mobility comments: Deferred (pt in recliner beginning/end of session).    Transfers Overall transfer level: Needs assistance Equipment used: Rolling walker (2 wheels) Transfers: Sit to/from Stand Sit to Stand: Min guard, Supervision           General transfer comment: x2 trials standing from recliner; vc's for UE placement    Ambulation/Gait Ambulation/Gait assistance: Min guard, Supervision Gait Distance (Feet):  (160 feet x2) Assistive device: Rolling walker (2 wheels)   Gait velocity: decreased     General Gait Details: partial step through gait pattern; R foot appearing mildly overpronated compared to L (improved with pt wearing shoes) and B knees touching occasionally   Stairs Stairs: Yes Stairs assistance: Min guard Stair Management: Two rails, Alternating pattern, Forwards Number of Stairs: 4 General stair comments: steady stairs navigation   Wheelchair Mobility    Modified Rankin (Stroke Patients Only)       Balance Overall balance assessment: Needs assistance Sitting-balance support: No upper extremity supported, Feet supported Sitting balance-Leahy Scale: Good Sitting balance - Comments: steady sitting reaching within BOS   Standing balance support: Bilateral upper extremity supported, During functional activity, Reliant on assistive device for balance Standing balance-Leahy Scale: Good Standing balance comment: steady ambulating with RW use                            Cognition Arousal/Alertness: Awake/alert Behavior During Therapy: WFL for tasks assessed/performed, Impulsive Overall Cognitive Status: Impaired/Different from baseline Area of Impairment: Orientation, Following commands,  Safety/judgement                 Orientation Level: Disoriented to, Time, Situation     Following Commands: Follows one step  commands inconsistently Safety/Judgement: Decreased awareness of safety, Decreased awareness of deficits     General Comments: Pt reports being in hospital (unable to state [facility]).        Exercises      General Comments  Nursing cleared pt for participation in physical therapy.  Pt agreeable to PT session.      Pertinent Vitals/Pain Pain Assessment Pain Assessment: No/denies pain Pt's HR 105 bpm at rest to 141 bpm with activity during sessions activities (nurse notified of elevated HR with activity).    Home Living                          Prior Function            PT Goals (current goals can now be found in the care plan section) Acute Rehab PT Goals Patient Stated Goal: to improve mobility and balance PT Goal Formulation: With patient Time For Goal Achievement: 06/05/22 Potential to Achieve Goals: Good Progress towards PT goals: Progressing toward goals    Frequency    Min 3X/week      PT Plan Current plan remains appropriate    Co-evaluation              AM-PAC PT "6 Clicks" Mobility   Outcome Measure  Help needed turning from your back to your side while in a flat bed without using bedrails?: None Help needed moving from lying on your back to sitting on the side of a flat bed without using bedrails?: A Little Help needed moving to and from a bed to a chair (including a wheelchair)?: A Little Help needed standing up from a chair using your arms (e.g., wheelchair or bedside chair)?: A Little Help needed to walk in hospital room?: A Little Help needed climbing 3-5 steps with a railing? : A Little 6 Click Score: 19    End of Session Equipment Utilized During Treatment: Gait belt Activity Tolerance: Patient tolerated treatment well Patient left: in chair;with call bell/phone within reach;with chair alarm set;with nursing/sitter in room;with family/visitor present Nurse Communication: Mobility status;Precautions PT Visit Diagnosis:  Unsteadiness on feet (R26.81);Muscle weakness (generalized) (M62.81);History of falling (Z91.81);Other abnormalities of gait and mobility (R26.89)     Time: 1610-9604 PT Time Calculation (min) (ACUTE ONLY): 26 min  Charges:  $Gait Training: 8-22 mins $Therapeutic Activity: 8-22 mins                    Hendricks Limes, PT 05/23/22, 11:55 AM

## 2022-05-23 NOTE — Discharge Summary (Signed)
Physician Discharge Summary   Patient: Gary Dawson MRN: [id number]  DOB: 11-05-1940   Admit:     Date of Admission: 05/20/2022 Admitted from: home   Discharge: Date of discharge: 05/23/22 Disposition: Home health Condition at discharge: good  CODE STATUS: DNR - discussion today w/ patient - he voices that he does not want to be in pain, he does not want CPR if it's not guaranteed to work, he does not want intubation. Clarified this with his POA as well who was under the impression DNR meant "refuse ALL medical treatment" which is not the case. Patient has capacity to state his goals     Discharge Physician: Gary Nielsen, DO [facility]     PCP: Gary Nurse, MD  Recommendations for Outpatient Follow-up:  Follow up with PCP Gary Nurse, MD in 1-2 weeks Discuss likelihood of early dementia diagnosis  Please obtain labs/tests: consider BMP, CBC Please follow up on the following pending results: none PCP AND OTHER OUTPATIENT PROVIDERS: SEE BELOW FOR SPECIFIC DISCHARGE INSTRUCTIONS PRINTED FOR PATIENT IN ADDITION TO GENERIC AVS PATIENT INFO     Discharge Instructions     (HEART FAILURE PATIENTS) Call MD:  Anytime you have any of the following symptoms: 1) 3 pound weight gain in 24 hours or 5 pounds in 1 week 2) shortness of breath, with or without a dry hacking cough 3) swelling in the hands, feet or stomach 4) if you have to sleep on extra pillows at night in order to breathe.   Complete by: As directed    Diet - low sodium heart healthy   Complete by: As directed    Discharge instructions   Complete by: As directed    Follow up with primary care provider ASAP   Increase activity slowly   Complete by: As directed          Discharge Diagnoses: Principal Problem:   Dementia with behavioral disturbance (HCC) Active Problems:   Atrial fibrillation with rapid ventricular response (HCC)   Altered mental status, unspecified   Permanent  atrial fibrillation (HCC)   NICM (nonischemic cardiomyopathy) (HCC)   History of cardioembolic cerebrovascular accident (CVA)   Benign essential hypertension   Microscopic hematuria   Pure hypercholesterolemia   Sleep apnea in adult   OSA on CPAP   Abdominal aortic aneurysm (AAA) without rupture (HCC)   AAA (abdominal aortic aneurysm) without rupture (HCC)   Altered mental status       Hospital Course: Gary Dawson is a 82 y.o. 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 To ED via EMS from home with sudden onset of AMS @ 1600 05/04 with delusions and aggression. Pt was discharged from hospital 2 days prior following AAA repair (Drs Wyn Quaker and [employer]).  05/04:  Pt was given 5 mg Versed IM and 2.5 mg Haldol IM by EMS @ 1820. Difficulty w/ workup/imaging d/t confusion. CTA chest no concerns.  CT head no concerns, does show chronic small-vessel ischemic disease and unchanged ventriculomegaly. Labs no concerns, no apparent infection.  05/05: early AM admitted to hospitalist service for Afib RVR and AMS. Was placed on heparin and diltiazem gtt, these were d/c later int he morning - rate controlled and able to take pills w/ Xarelto. SLP eval. MRI brain non-acute. Long discussion w/ daughter and with his significant other - they describe some paranoid delusional type behavior (accusing neighbor and family of poisoning  him, etc), daughter suspects possible cognitive impairment/sundowning type issue, SO notes he has been just like this in the past w/ anesthesia event or hospitalization.  05/06: SLP clear for diet. PT and OT to see. Pt more alert today, still confused, oriented to self, knows he is in hospital but cannot say why ("recovering from AAA"), initially told me he was in [place] then corrected to [facility], when asked about family he states "I don't have a daughter." Asked psychiatry to weigh tin to r/o delusional/paranoid disorder. They agree  this is more related to dementia / cognitive impairment - likely Alzheimer's +/- vascular dementia.  05/07: remains stable overnight, home health arrangements made. Close to baseline cognition.   Consultants:  Psychiatry  Procedures: none      ASSESSMENT & PLAN:   Altered mental status / Delirium likely d/t dementia complicated by stress and medication effect  Question embolic complication to brain post-AAA repair but MRI was negative = ruled out Per caretakers, had similar issue w/ previous surgery - confusion few days after  Suspect underlying mild/moderate cognitive impairment exacerbated by anesthesia / stressors, have ruled out underlying psychiatric d/o  Follow outpatient   Atrial fibrillation RVR Now rate controlled  restarted home meds diltiazem, dignoxin, metoprolol, rivaroxaban  Hx HFrEF, Nonischemic Cardiomyopathy 04/19/22: 2D echo (+)moderate to severe decreased LVEF 30-35% with global hypokinesis- per cardiology note 04/19 felt to be from tachycardia. Prior EF was 45%.  Appears euvolemic at this time Continue beta blocker, SLGT2, statin holding ACE/ARB d/t low BP  Recent repair AAA w/ vascular surgery  Imaging / consult was not needed, follow outpatient as directed   Essential HTN Resume po meds w/ diltiazem, metoprolol Holding losartan d/t softer BP  CAD Recent cardiac cath showed heavily calcified coronary arteries with mild to moderate nonobstructive CAD and occluded proximal right PDA with L>R collateral  No ASA per cardiology - will d/c this from his list  Continue statin and beta blocker   HLD Statin   Advanced care planning  DNR Gary Dawson, SO is medical decision maker - Advanced Directive was reviewed discussion today w/ patient - he voices that he does not want to be in pain, he does not want CPR if it's not guaranteed to work, he does not want intubation. Clarified this with his POA as well who was under the impression DNR meant "refuse ALL  medical treatment" which is not the case. Patient has capacity to state his goals  scanned advanced directive / other forms Follow w/ primary care              Discharge Instructions  Allergies as of 05/23/2022       Reactions   Other Other (See Comments)   Trees and grasses        Medication List     STOP taking these medications    aspirin EC 81 MG tablet   baclofen 10 MG tablet Commonly known as: LIORESAL   losartan 25 MG tablet Commonly known as: COZAAR   traMADol 50 MG tablet Commonly known as: ULTRAM       TAKE these medications    atorvastatin 40 MG tablet Commonly known as: LIPITOR Take 1 tablet (40 mg total) by mouth daily. What changed: when to take this   Biotin 40981 MCG Tabs Take 1 tablet by mouth daily at 6 (six) AM.   dapagliflozin propanediol 10 MG Tabs tablet Commonly known as: FARXIGA Take 1 tablet (10 mg total) by mouth daily. What changed:  when to take this   digoxin 0.25 MG tablet Commonly known as: LANOXIN Take 1 tablet (0.25 mg total) by mouth daily. What changed: when to take this   diltiazem 120 MG 24 hr capsule Commonly known as: CARDIZEM CD Take 120 mg by mouth daily.   hydroxypropyl methylcellulose / hypromellose 2.5 % ophthalmic solution Commonly known as: ISOPTO TEARS / GONIOVISC Place 1 drop into both eyes 3 (three) times daily.   metoprolol succinate 50 MG 24 hr tablet Commonly known as: TOPROL-XL Take 1 tablet (50 mg total) by mouth 2 (two) times daily. Take with or immediately following a meal.   oxybutynin 5 MG 24 hr tablet Commonly known as: DITROPAN-XL Take 5 mg by mouth at bedtime.   rivaroxaban 20 MG Tabs tablet Commonly known as: Xarelto TAKE 1 TABLET EVERY DAY WITH SUPPER. He can proceed with AAA intervention at the discretion of vascular surgery.  Rivaroxaban should be held for 2 days before the procedure and restarted as soon as it is safe to do so. What changed:  how much to take when to  take this   VITAMIN B-12 PO Take 1 tablet by mouth daily at 6 (six) AM.               Durable Medical Equipment  (From admission, onward)           Start     Ordered   05/23/22 0939  For home use only DME Walker  Once       Comments: 2 wheel rolling walker  Question:  Patient needs a walker to treat with the following condition  Answer:  Dementia (HCC)   05/23/22 0939   05/22/22 1630  For home use only DME Walker rolling  Once       Question Answer Comment  Walker: With 5 Inch Wheels   Patient needs a walker to treat with the following condition Other abnormalities of gait and mobility      05/22/22 1630             Follow-up Information     Gary Nurse, MD. Schedule an appointment as soon as possible for a visit.   Specialty: Internal Medicine Contact information: [address] [place] [address] [phone]                 Allergies  Allergen Reactions   Other Other (See Comments)    Trees and grasses      Subjective: pt reports feeling well today, Jodie states he is close to his baseline.    Discharge Exam: BP 124/72 (BP Location: Right Arm)   Pulse 71   Temp (!) 97.5 F (36.4 C) (Oral)   Resp 20   Ht 6\' 1"  (1.854 m)   Wt 80 kg   SpO2 99%   BMI 23.27 kg/m  General: Pt is alert, awake, not in acute distress Cardiovascular: RRR, S1/S2 +, no rubs, no gallops Respiratory: CTA bilaterally, no wheezing, no rhonchi Abdominal: Soft, NT, ND, bowel sounds + Extremities: no edema, no cyanosis     The results of significant diagnostics from this hospitalization (including imaging, microbiology, ancillary and laboratory) are listed below for reference.     Microbiology: Recent Results (from the past 240 hour(s))  MRSA Next Gen by PCR, Nasal     Status: None   Collection Time: 05/18/22 11:33 AM   Specimen: Nasal Mucosa; Nasal Swab  Result Value Ref Range Status   MRSA by PCR Next  Gen NOT DETECTED NOT DETECTED Final     Comment: (NOTE) The GeneXpert MRSA Assay (FDA approved for NASAL specimens only), is one component of a comprehensive MRSA colonization surveillance program. It is not intended to diagnose MRSA infection nor to guide or monitor treatment for MRSA infections. Test performance is not FDA approved in patients less than 74 years old. Performed at [facility], [address] [place], [place] [address]   Blood culture (routine x 2)     Status: None (Preliminary result)   Collection Time: 05/20/22  6:50 PM   Specimen: BLOOD  Result Value Ref Range Status   Specimen Description BLOOD RIGHT ANTECUBITAL  Final   Special Requests   Final    BOTTLES DRAWN AEROBIC AND ANAEROBIC Blood Culture adequate volume   Culture   Final    NO GROWTH 3 DAYS Performed at [facility], [address], [place], [place] [address]    Report Status PENDING  Incomplete  Blood culture (routine x 2)     Status: None (Preliminary result)   Collection Time: 05/20/22  6:55 PM   Specimen: BLOOD  Result Value Ref Range Status   Specimen Description BLOOD LEFT ANTECUBITAL  Final   Special Requests   Final    BOTTLES DRAWN AEROBIC AND ANAEROBIC Blood Culture adequate volume   Culture   Final    NO GROWTH 3 DAYS Performed at [facility], [address], [place], [place] [address]    Report Status PENDING  Incomplete  Urine Culture     Status: None   Collection Time: 05/21/22  3:29 AM   Specimen: Urine, Suprapubic  Result Value Ref Range Status   Specimen Description   Final    URINE, SUPRAPUBIC Performed at [facility], [address], [place], [place] [address]    Special Requests   Final    NONE Performed at [facility], [address] [place], [place] [address]    Culture   Final    NO GROWTH Performed at [facility], [address], [place], [place] [address]    Report Status 05/22/2022 FINAL  Final     Labs: BNP (last 3  results) Recent Labs    05/21/22 0329  BNP 318.1*   Basic Metabolic Panel: Recent Labs  Lab 05/19/22 0405 05/20/22 1850  NA 138 138  K 4.0 3.6  CL 108 105  CO2 24 27  GLUCOSE 129* 101*  BUN 18 24*  CREATININE 0.84 0.78  CALCIUM 8.4* 8.7*   Liver Function Tests: Recent Labs  Lab 05/20/22 1850 05/21/22 0329  AST 24 35  ALT 15 16  ALKPHOS 64 62  BILITOT 1.6* 2.0*  PROT 8.0 7.2  ALBUMIN 4.0 3.6   No results for input(s): "LIPASE", "AMYLASE" in the last 168 hours. Recent Labs  Lab 05/21/22 0329  AMMONIA 11   CBC: Recent Labs  Lab 05/19/22 0405 05/20/22 1850 05/21/22 1035  WBC 12.0* 10.2 8.9  NEUTROABS  --  7.6  --   HGB 11.7* 13.6 12.2*  HCT 34.1* 40.8 36.4*  MCV 98.6 100.7* 98.6  PLT 141* 141* 146*   Cardiac Enzymes: Recent Labs  Lab 05/21/22 0329  CKTOTAL 229   BNP: Invalid input(s): "POCBNP" CBG: Recent Labs  Lab 05/18/22 1122  GLUCAP 94   D-Dimer No results for input(s): "DDIMER" in the last 72 hours. Hgb A1c No results for input(s): "HGBA1C" in the last 72 hours. Lipid Profile No results  for input(s): "CHOL", "HDL", "LDLCALC", "TRIG", "CHOLHDL", "LDLDIRECT" in the last 72 hours. Thyroid function studies No results for input(s): "TSH", "T4TOTAL", "T3FREE", "THYROIDAB" in the last 72 hours.  Invalid input(s): "FREET3" Anemia work up No results for input(s): "VITAMINB12", "FOLATE", "FERRITIN", "TIBC", "IRON", "RETICCTPCT" in the last 72 hours. Urinalysis    Component Value Date/Time   COLORURINE YELLOW (A) 05/21/2022 0329   APPEARANCEUR CLEAR (A) 05/21/2022 0329   APPEARANCEUR Clear 03/09/2022 0817   LABSPEC >1.046 (H) 05/21/2022 0329   PHURINE 5.0 05/21/2022 0329   GLUCOSEU 150 (A) 05/21/2022 0329   HGBUR LARGE (A) 05/21/2022 0329   BILIRUBINUR NEGATIVE 05/21/2022 0329   BILIRUBINUR Negative 03/09/2022 0817   KETONESUR 5 (A) 05/21/2022 0329   PROTEINUR 30 (A) 05/21/2022 0329   NITRITE NEGATIVE 05/21/2022 0329   LEUKOCYTESUR  NEGATIVE 05/21/2022 0329   Sepsis Labs Recent Labs  Lab 05/19/22 0405 05/20/22 1850 05/21/22 1035  WBC 12.0* 10.2 8.9   Microbiology Recent Results (from the past 240 hour(s))  MRSA Next Gen by PCR, Nasal     Status: None   Collection Time: 05/18/22 11:33 AM   Specimen: Nasal Mucosa; Nasal Swab  Result Value Ref Range Status   MRSA by PCR Next Gen NOT DETECTED NOT DETECTED Final    Comment: (NOTE) The GeneXpert MRSA Assay (FDA approved for NASAL specimens only), is one component of a comprehensive MRSA colonization surveillance program. It is not intended to diagnose MRSA infection nor to guide or monitor treatment for MRSA infections. Test performance is not FDA approved in patients less than 75 years old. Performed at [facility], [address] [place], [place] [address]   Blood culture (routine x 2)     Status: None (Preliminary result)   Collection Time: 05/20/22  6:50 PM   Specimen: BLOOD  Result Value Ref Range Status   Specimen Description BLOOD RIGHT ANTECUBITAL  Final   Special Requests   Final    BOTTLES DRAWN AEROBIC AND ANAEROBIC Blood Culture adequate volume   Culture   Final    NO GROWTH 3 DAYS Performed at [facility], [address] [place], [place] [address]    Report Status PENDING  Incomplete  Blood culture (routine x 2)     Status: None (Preliminary result)   Collection Time: 05/20/22  6:55 PM   Specimen: BLOOD  Result Value Ref Range Status   Specimen Description BLOOD LEFT ANTECUBITAL  Final   Special Requests   Final    BOTTLES DRAWN AEROBIC AND ANAEROBIC Blood Culture adequate volume   Culture   Final    NO GROWTH 3 DAYS Performed at [facility], [address] [place], [place] [address]    Report Status PENDING  Incomplete  Urine Culture     Status: None   Collection Time: 05/21/22  3:29 AM   Specimen: Urine, Suprapubic  Result Value Ref Range Status   Specimen Description   Final    URINE,  SUPRAPUBIC Performed at [facility], [address], [place], [place] [address]    Special Requests   Final    NONE Performed at [facility], [address] [place], [place] [address]    Culture   Final    NO GROWTH Performed at [facility], [address], [place], [place] [address]    Report Status 05/22/2022 FINAL  Final   Imaging MR BRAIN WO CONTRAST  Result Date: 05/21/2022 CLINICAL DATA:  Altered mental status  beginning today. Worsening confusion. EXAM: MRI HEAD WITHOUT CONTRAST TECHNIQUE: Multiplanar, multiecho pulse sequences of the brain and surrounding structures were obtained without intravenous contrast. COMPARISON:  Head CT yesterday FINDINGS: Brain: Study suffers from considerable motion degradation. Diffusion imaging does not show any acute or subacute infarction. There are chronic small-vessel ischemic changes of the pons. 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 Cerebral hemispheres show volume loss with some old small vessel insults of the white matter. No cortical or large vessel territory infarction. No hemorrhage, hydrocephalus or extra-axial collection. Vascular: Major vessels at the base of the brain show flow. Skull and upper cervical spine: Negative Sinuses/Orbits: Clear Other: None IMPRESSION: Motion degraded study. No acute finding. Chronic small-vessel ischemic changes of the pons and cerebral hemispheric white matter. 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 Electronically Signed   By: Paulina Fusi M.D.   On: 05/21/2022 14:11   CT Angio Chest PE W and/or Wo Contrast  Result Date: 05/21/2022 CLINICAL DATA:  Hypoxia EXAM: CT ANGIOGRAPHY CHEST WITH CONTRAST TECHNIQUE: Multidetector CT imaging of the chest was performed using the standard protocol during bolus administration of intravenous contrast. Multiplanar CT image reconstructions and MIPs were obtained to evaluate the vascular anatomy. RADIATION DOSE REDUCTION: This exam was  performed according to the departmental dose-optimization program which includes automated exposure control, adjustment of the mA and/or kV according to patient size and/or use of iterative reconstruction technique. CONTRAST:  OMNIPAQUE IOHEXOL 350 MG/ML SOLN COMPARISON:  None Available. FINDINGS: Cardiovascular: No filling defects in the pulmonary arteries to suggest pulmonary emboli. Heart is mildly enlarged. Diffuse coronary artery and aortic atherosclerosis. No aneurysm. Mediastinum/Nodes: No mediastinal, hilar, or axillary adenopathy. Trachea and esophagus are unremarkable. Thyroid unremarkable. Lungs/Pleura: No confluent opacities or effusions. Upper Abdomen: No acute findings Musculoskeletal: Chest wall soft tissues are unremarkable. No acute bony abnormality. Review of the MIP images confirms the above findings. IMPRESSION: No evidence of pulmonary embolus. Cardiomegaly, coronary artery disease. No acute cardiopulmonary disease. Aortic Atherosclerosis (ICD10-I70.0). Electronically Signed   By: Charlett Nose M.D.   On: 05/21/2022 00:49   DG Chest Portable 1 View  Result Date: 05/20/2022 CLINICAL DATA:  Hypoxia and altered mental status EXAM: PORTABLE CHEST 1 VIEW COMPARISON:  Chest radiograph 04/18/2022 FINDINGS: Stable cardiomegaly.  Aortic atherosclerotic calcification. Increased hazy airspace and interstitial opacities compared with 04/18/2022 with a lower lung predominant. No definite pleural effusion. No pneumothorax. No displaced rib fractures. IMPRESSION: Increased hazy airspace and interstitial opacities compared with 04/18/2022. Findings could reflect pulmonary edema or atypical infection. Electronically Signed   By: Minerva Fester M.D.   On: 05/20/2022 23:43   CT Head Wo Contrast  Result Date: 05/20/2022 CLINICAL DATA:  Altered mental status EXAM: CT HEAD WITHOUT CONTRAST TECHNIQUE: Contiguous axial images were obtained from the base of the skull through the vertex without intravenous  contrast. RADIATION DOSE REDUCTION: This exam was performed according to the departmental dose-optimization program which includes automated exposure control, adjustment of the mA and/or kV according to patient size and/or use of iterative reconstruction technique. COMPARISON:  CT head 04/18/2022 FINDINGS: Brain: No intracranial hemorrhage, mass effect, or evidence of acute infarct. Unchanged ventriculomegaly. No extra-axial fluid collection. Generalized cerebral atrophy. Ill-defined hypoattenuation within the cerebral white matter is nonspecific but consistent with chronic small vessel ischemic disease. Chronic bilateral cerebellar infarcts. Vascular: No hyperdense vessel. Intracranial arterial calcification. Skull: No fracture or focal lesion. Sinuses/Orbits: No acute finding. Paranasal sinuses and mastoid air cells are well aerated. Other: None. IMPRESSION: No  acute intracranial abnormality. Chronic small-vessel ischemic disease and unchanged ventriculomegaly. Electronically Signed   By: Minerva Fester M.D.   On: 05/20/2022 19:31      Time coordinating discharge: over 30 minutes  SIGNED:  Sunnie Nielsen DO [facility]

## 2022-05-23 NOTE — TOC Transition Note (Addendum)
Transition of Care [facility]) - CM/SW Discharge Note   Patient Details  Name: Gary Dawson MRN: [id number] Date of Birth: Jan 26, 1940  Transition of Care [facility]) CM/SW Contact:  Garret Reddish, RN Phone Number: 05/23/2022, 10:00 AM   Clinical Narrative:    Chart reviewed.  Noted that patient will be a tentative discharge for today.  I have spoken with patient's POA Gary Dawson. She informs me that prior to admission she was staying with patient.  She reported that patient had just has AAA repair and she was having to say with him to assist with his care.  She reports that she will be staying with him on discharge.    Gary Dawson reports that she takes patient to his appointments.  [facility] use [facility] [address] in [place].  Medication are affordable.  Patient was active with [facility] prior to admission.  Gary Dawson reports the services [facility] provided was PT and Nursing.  I have informed Gary Dawson that [facility] will continue to provide PT and Nursing services.  I have informed Gary Dawson that I have also added OT services to help with bathing at home.  Gary Dawson reports that patient has a cane at home. I have informed Gary Dawson that PT has recommend at rolling walker for home use.  I have asked [employer] to provide 2 Wheeled rolling walker today at bedside.    Gary Dawson will transport patient home when stable for discharge.         Final next level of care: Home w Home Health Services Barriers to Discharge: No Barriers Identified   Patient Goals and CMS Choice   Choice offered to / list presented to : [facility] POA / Guardian  Discharge Placement                    Name of family member notified: Gary Dawson ( HPOA) Patient and family notified of of transfer: 05/23/22  Discharge Plan and Services Additional resources added to the After Visit Summary for                  DME Arranged: Walker rolling DME Agency: [employer] Date DME Agency Contacted: 05/23/22 Time DME Agency Contacted: 1000 Representative spoke with at  DME Agency: Barbara Cower HH Arranged: RN, PT, OT [facility] Agency: [facility] (Patient was active with [facility]) Date [facility] Agency Contacted: 05/23/22 Time HH Agency Contacted: 1000    Social Determinants of Health (SDOH) Interventions SDOH Screenings   Food Insecurity: No Food Insecurity (05/21/2022)  Housing: Low Risk  (05/21/2022)  Transportation Needs: No Transportation Needs (05/21/2022)  Utilities: Not At Risk (05/21/2022)  Tobacco Use: Low Risk  (05/21/2022)     Readmission Risk Interventions     No data to display

## 2022-05-24 DIAGNOSIS — F03918 Unspecified dementia, unspecified severity, with other behavioral disturbance: Secondary | ICD-10-CM | POA: Diagnosis not present

## 2022-05-24 LAB — BLOOD GAS, VENOUS
O2 Saturation: 65.3 %
Patient temperature: 37
pCO2, Ven: 46 mmHg (ref 44–60)

## 2022-05-24 LAB — CULTURE, BLOOD (ROUTINE X 2): Special Requests: ADEQUATE

## 2022-05-24 NOTE — Progress Notes (Signed)
Mobility Specialist - Progress Note   05/24/22 1011  Mobility  Activity Ambulated with assistance in hallway  Level of Assistance Standby assist, set-up cues, supervision of patient - no hands on  Assistive Device Front wheel walker  Distance Ambulated (ft) 200 ft  RLE Weight Bearing WBAT  Activity Response Tolerated well  $Mobility charge 1 Mobility  Mobility Specialist Start Time (ACUTE ONLY) 0947  Mobility Specialist Stop Time (ACUTE ONLY) 1005  Mobility Specialist Time Calculation (min) (ACUTE ONLY) 18 min   Pt amb in the room with RN upon arrival, utilizing RA. Pt agreeable to amb in the hallway. Pt sits EOB and indep dons shoes, STS to RW MinA-- flexed posture upon standing. Pt amb one lap around the NS with supervision, min Vcs for posture and direction. Pt denies dizziness, pain or SOB during activity. Pt returned to room, left seated in recliner with alarm set and needs within reach.   Zetta Bills Mobility Specialist 05/24/22 10:16 AM

## 2022-05-24 NOTE — Progress Notes (Signed)
Physical Therapy Treatment Patient Details Name: Gary Dawson MRN: [id number] DOB: Jun 23, 1940 Today's Date: 05/24/2022   History of Present Illness Pt is an 82 y.o. male presenting to hospital 05/20/22 with concerns for AMS; recent hospitalization for AAA repair 2 days prior.  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  PMH includes acute on chronic HFrEF, atypical chest pain, htn, AAA without rupture, MVP, parietal lobe infraction, a-fib, sleep apnea.    PT Comments    Pt was long sitting in bed upon arrival. He is oriented to self and hospital however does have significant baseline dementia. Pt was able to fully participate. He easily and safely was able to exit bed, stand to RW, and ambulate ~ 150 ft. No LOB however lots of Vcs for improved gait kinematics and posture. Pt tends to ambulate with shuffling gait+ flexed  posture + narrow BOS. Pt demonstrated safe abilities to ascend/descend stairs 2 x. Overall pt is moving well from a PT standpoint. He will continue to benefit from skilled PT at DC to maximize independence and safety with all ADLs while decreasing caregiver burden.    Recommendations for follow up therapy are one component of a multi-disciplinary discharge planning process, led by the attending physician.  Recommendations may be updated based on patient status, additional functional criteria and insurance authorization.     Assistance Recommended at Discharge Frequent or constant Supervision/Assistance  Patient can return home with the following A little help with walking and/or transfers;A little help with bathing/dressing/bathroom;Assistance with cooking/housework;Direct supervision/assist for medications management;Assist for transportation;Help with stairs or ramp for entrance   Equipment Recommendations  None recommended by PT (peronal RW in room)       Precautions / Restrictions Precautions Precautions:  Fall Restrictions Weight Bearing Restrictions: Yes RLE Weight Bearing: Weight bearing as tolerated     Mobility  Bed Mobility Overal bed mobility: Needs Assistance Bed Mobility: Supine to Sit, Sit to Supine  Supine to sit: Supervision Sit to supine: Supervision   Transfers Overall transfer level: Needs assistance Equipment used: Rolling walker (2 wheels) Transfers: Sit to/from Stand Sit to Stand: Supervision  General transfer comment: Vcs for handplacement only    Ambulation/Gait Ambulation/Gait assistance: Supervision Gait Distance (Feet): 150 Feet Assistive device: Rolling walker (2 wheels) Gait Pattern/deviations: Step-through pattern, Shuffle, Decreased step length - right, Decreased step length - left, Decreased stride length, Trunk flexed Gait velocity: decreased  General Gait Details: Pt was able to ambulate ~ 150 ft with RW. No physical assistance required but vcs throughout to increased foot clearance, correct posture, and widen BOS   Stairs Stairs: Yes Stairs assistance: Supervision Stair Management: Forwards, Two rails, Alternating pattern Number of Stairs: 4 General stair comments: Pt performed ascending/descending 4 stairs 2 x with supervision only    Balance Overall balance assessment: Needs assistance Sitting-balance support: No upper extremity supported, Feet supported Sitting balance-Leahy Scale: Good     Standing balance support: Bilateral upper extremity supported, During functional activity, Reliant on assistive device for balance Standing balance-Leahy Scale: Fair Standing balance comment: reliant on RW for dynamic balance       Cognition Arousal/Alertness: Awake/alert Behavior During Therapy: WFL for tasks assessed/performed, Impulsive Overall Cognitive Status: History of cognitive impairments - at baseline Area of Impairment: Orientation, Following commands, Safety/judgement    Orientation Level: Time, Situation, Disoriented to      Following Commands: Follows one step commands inconsistently Safety/Judgement: Decreased awareness of safety, Decreased awareness of deficits  Pertinent Vitals/Pain Pain Assessment Pain Assessment: No/denies pain Breathing: normal     PT Goals (current goals can now be found in the care plan section) Acute Rehab PT Goals Patient Stated Goal: none stated Progress towards PT goals: Progressing toward goals    Frequency    Min 3X/week      PT Plan Current plan remains appropriate       AM-PAC PT "6 Clicks" Mobility   Outcome Measure  Help needed turning from your back to your side while in a flat bed without using bedrails?: None Help needed moving from lying on your back to sitting on the side of a flat bed without using bedrails?: A Little Help needed moving to and from a bed to a chair (including a wheelchair)?: A Little Help needed standing up from a chair using your arms (e.g., wheelchair or bedside chair)?: A Little Help needed to walk in hospital room?: A Little Help needed climbing 3-5 steps with a railing? : A Little 6 Click Score: 19    End of Session Equipment Utilized During Treatment: Gait belt Activity Tolerance: Patient tolerated treatment well Patient left: in bed;with call bell/phone within reach;with bed alarm set;with nursing/sitter in room Nurse Communication: Mobility status;Precautions PT Visit Diagnosis: Unsteadiness on feet (R26.81);Muscle weakness (generalized) (M62.81);History of falling (Z91.81);Other abnormalities of gait and mobility (R26.89)     Time: 2956-2130 PT Time Calculation (min) (ACUTE ONLY): 14 min  Charges:  $Gait Training: 8-22 mins                    Jetta Lout PTA 05/24/22, 8:31 AM

## 2022-05-24 NOTE — Progress Notes (Signed)
Progress Note   Patient: Gary Dawson ZOX:[id number] DOB: Dec 17, 1940 DOA: 05/20/2022     3 DOS: the patient was seen and examined on 05/24/2022    Subjective:  Patient was seen and examined at bedside this morning He was able to engage in the conversation Apparently he was discharged yesterday however wife was unable to pick him up Denies chest pain nausea vomiting abdominal pain or respiratory complaint Currently off drips  Brief hospital course: Jasmine Nakayama is a 82 y.o. 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 To ED via EMS from home with sudden onset of AMS @ 1600 05/04 with delusions and aggression. Pt was discharged from hospital 2 days prior following AAA repair (Drs Wyn Quaker and [employer]).  05/04:  Pt was given 5 mg Versed IM and 2.5 mg Haldol IM by EMS @ 1820. Difficulty w/ workup/imaging d/t confusion. CTA chest no concerns.  CT head no concerns, does show chronic small-vessel ischemic disease and unchanged ventriculomegaly. Labs no concerns, no apparent infection.  05/05: early AM admitted to hospitalist service for Afib RVR and AMS. Was placed on heparin and diltiazem gtt, these were d/c later int he morning - rate controlled and able to take pills w/ Xarelto. SLP eval. MRI brain non-acute. Long discussion w/ daughter and with his significant other - they describe some paranoid delusional type behavior (accusing neighbor and family of poisoning him, etc), daughter suspects possible cognitive impairment/sundowning type issue, SO notes he has been just like this in the past w/ anesthesia event or hospitalization.  05/06: SLP clear for diet. PT and OT to see. Pt more alert today, still confused, oriented to self, knows he is in hospital but cannot say why ("recovering from AAA"), initially told me he was in [place] then corrected to [facility], when asked about family he states "I don't have a daughter." Asked psychiatry to weigh tin to  r/o delusional/paranoid disorder. 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  05/07: remains stable overnight, home health arrangements made. Close to baseline cognition.  Patient discharged yesterday awaiting family pick up, No acute overnight events    ASSESSMENT & PLAN:  Altered mental status / Delirium likely d/t dementia complicated by stress and medication effect  Question embolic complication to brain post-AAA repair but MRI was negative = ruled out Per caretakers, had similar issue w/ previous surgery - confusion few days after  Suspect underlying mild/moderate cognitive impairment exacerbated by anesthesia / stressors, have ruled out underlying psychiatric d/o  Follow outpatient  Awaiting wife to pick him up today   Atrial fibrillation RVR Now rate controlled  Continue home meds diltiazem, digoxin, metoprolol, rivaroxaban   Hx HFrEF, Nonischemic Cardiomyopathy 04/19/22: 2D echo (+)moderate to severe decreased LVEF 30-35% with global hypokinesis- per cardiology note 04/19 felt to be from tachycardia. Prior EF was 45%.  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  No ASA per cardiology  Continue statin and beta blocker    HLD Statin    Advanced  care planning  DNR   Physical Exam: Patient seen and examined at bedside this morning Able to engage in conversation Light and oriented x 2 Moves about with a walker No abdominal tenderness Heart sounds 1 and 2 present in A-fib  Vitals:   05/23/22 0500  05/23/22 0828 05/23/22 1550 05/23/22 2335  BP:  124/72 130/72 119/79  Pulse:  71 72 71  Resp:  20 18 17   Temp:  (!) 97.5 F (36.4 C)  98.2 F (36.8 C)  TempSrc:  Oral    SpO2:  99% 99% 100%  Weight: 80 kg     Height:        Data Reviewed: No labs today for review  Family Communication: None at bedside  Disposition: Home health  Time spent: 30 minutes  Author: Loyce Dys, MD 05/24/2022 11:53 AM  For on call review [email or web address].

## 2022-05-25 LAB — CULTURE, BLOOD (ROUTINE X 2): Culture: NO GROWTH

## 2022-05-27 ENCOUNTER — Other Ambulatory Visit: Payer: Self-pay | Admitting: Medical

## 2022-05-29 DIAGNOSIS — H04123 Dry eye syndrome of bilateral lacrimal glands: Secondary | ICD-10-CM | POA: Diagnosis not present

## 2022-05-31 ENCOUNTER — Other Ambulatory Visit: Payer: Self-pay | Admitting: Medical

## 2022-06-01 ENCOUNTER — Ambulatory Visit: Payer: Medicare HMO | Attending: Medical | Admitting: Medical

## 2022-06-01 VITALS — BP 110/66 | HR 96 | Ht 73.0 in | Wt 177.0 lb

## 2022-06-01 DIAGNOSIS — I5022 Chronic systolic (congestive) heart failure: Secondary | ICD-10-CM

## 2022-06-01 DIAGNOSIS — I2510 Atherosclerotic heart disease of native coronary artery without angina pectoris: Secondary | ICD-10-CM | POA: Diagnosis not present

## 2022-06-01 DIAGNOSIS — I428 Other cardiomyopathies: Secondary | ICD-10-CM

## 2022-06-01 DIAGNOSIS — I4821 Permanent atrial fibrillation: Secondary | ICD-10-CM

## 2022-06-01 DIAGNOSIS — I714 Abdominal aortic aneurysm, without rupture, unspecified: Secondary | ICD-10-CM | POA: Diagnosis not present

## 2022-06-01 MED ORDER — RIVAROXABAN 20 MG PO TABS: ORAL_TABLET | ORAL | 3 refills | Status: DC

## 2022-06-01 MED ORDER — METOPROLOL SUCCINATE ER 50 MG PO TB24: 50.0000 mg | ORAL_TABLET | Freq: Two times a day (BID) | ORAL | 3 refills | Status: AC

## 2022-06-01 MED ORDER — LOSARTAN POTASSIUM 25 MG PO TABS: 25.0000 mg | ORAL_TABLET | Freq: Every day | ORAL | 3 refills | Status: AC

## 2022-06-01 NOTE — Patient Instructions (Signed)
Medication Instructions:  Your physician recommends that you continue on your current medications as directed. Please refer to the Current Medication list given to you today.  *If you need a refill on your cardiac medications before your next appointment, please call your pharmacy*  Lab Work: -None ordered If you have labs (blood work) drawn today and your tests are completely normal, you will receive your results only by: MyChart Message (if you have MyChart) OR A paper copy in the mail If you have any lab test that is abnormal or we need to change your treatment, we will call you to review the results.  Testing/Procedures: Your physician has requested that you have an echocardiogram. Echocardiography is a painless test that uses sound waves to create images of your heart. It provides your doctor with information about the size and shape of your heart and how well your heart's chambers and valves are working. This procedure takes approximately one hour. There are no restrictions for this procedure. Please do NOT wear cologne, perfume, aftershave, or lotions (deodorant is allowed). Please arrive 15 minutes prior to your appointment time.   Follow-Up: At [facility], you and your health needs are our priority.  As part of our continuing mission to provide you with exceptional heart care, we have created designated Provider Care Teams.  These Care Teams include your primary Cardiologist (physician) and Advanced Practice Providers (APPs -  Physician Assistants and Nurse Practitioners) who all work together to provide you with the care you need, when you need it.  Your next appointment:   2 month(s)  Provider:   Terrilee Croak, PA-C    Other Instructions -None

## 2022-06-01 NOTE — Progress Notes (Signed)
Cardiology Office Note:    Date:  06/01/2022   ID:  Gary Dawson, DOB [birth year], MRN [id number]  PCP:  Gracelyn Nurse, MD  [facility] Cardiologist:  Julien Nordmann, MD  [facility] Electrophysiologist:  None   Referring MD: Gracelyn Nurse, MD   Chief Complaint: Hospital follow-up  History of Present Illness:    Gary Dawson is a 82 y.o. 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   Patient presented to the ER from urgent care for frequent falls and altered mental status.  High-sensitivity troponin was 19.CTH was non-acute. CT abdomen/pelvis showed aortic aneurysm measuring up to 5.1cm. CT spine showed no acute fractures. He was noted to be in rapid afib started on IV dilt with improvement of heart rate. Echo showed reduced LVEF 30-35% and he was started on IV heparin. The patient underwent right and left cardiac cath showing RPDA lesion 100% stenosed with collaterals, no intervention was done. Overall it showed heavily calcified coronary arteries with mild to moderate nonobstructive CAD. EF was moderately to severely reduced by echo. Right heart cath shows mildly elevated left filling pressure, mild pulmonary HTN and normal cardiac output. Metoprolol dose was increased and they placed him as moderate risk for surgical intervention. The patient was started on digoxin. IV heparin was switched to Xarelto. He was discharged to SNF.    Patient was last seen 05/05/2022 and was overall stable since being home.  He denied any anginal symptoms.  Still was planning on AAA repair but needed cardiology clearance.  Patient was wanting [facility] for second opinion regarding heart failure treatment and PCI options.  Patient had not been on his medications since being discharged from SNF 4 days prior.  The patient was in rate controlled A-fib.  He was restarted on Toprol 50 mg twice daily.  He was restarted on losartan 25  mg daily and Farxiga 10 mg daily.  Patient underwent endovascular AAA repair 05/18/2022.  Patient was readmitted on 5/4 with altered mental status.  CT of the head was negative.  He was noted to be in A-fib RVR placed on IV heparin and IV Dilt with improvement of rates,  MRI of the brain was nonacute.  Psychiatry saw the patient and felt this was more related to dementia. patient was discharged on 5/7.  ACE/ARB discontinued due to low blood pressure.  Today, the patient reports some lightheadedness. BP is good today. EKG shows rate controlled Afib. He denies chest pain or shortness of breath. He is using a walker.No lowe leg edema. He has restart Losartan. Patient has not been using CPAP. He needs 90 days losartan, Xarelto and toprol. Patient is seeing [facility] next week. He is in the middle of moving to an apartment.   Past Medical History:  Diagnosis Date   Acute on chronic HFrEF (heart failure with reduced ejection fraction) (HCC)    a.) TTE 05/18/2020: EF 45%, glob HK, mild LVH, mod reduced RVSF, mild RVE, mod LAE, sev RAE, mod MAC, triv MR, sev TR, mild-mod AoV sclerosis, (+) IAS (L-R); b.) TTE 04/19/2022: EF 30-35%, glob HK, LVH, mod RVE, PASP 40.3, mod TR, mild AR; c.) [facility] 04/20/2022: mRA 8, mPA 27, mPCWP 20, LVEDP 13, CO 4.97, CI 2.34   Aortic atherosclerosis (HCC)    Aortic root dilatation (HCC)    a.) TTE 05/18/2020: 42 mm; b.) TTE 04/19/2022: 40 mm   Atypical chest pain  a. 03/2012 St echo: nl EF, no wma's.   BPH with obstruction/lower urinary tract symptoms    CAD (coronary artery disease)    a.) R/LHC 04/20/2022: 100% RPDA, 40% D1, 30% m-dLAD - med mgmt   Calculus in bladder    Chronic prostatitis    Depression    Elevated PSA    Essential hypertension    History of kidney stones    Hyperlipidemia    Infrarenal abdominal aortic aneurysm (AAA) without rupture (HCC)    a.) CT abd/pelvis 04/18/2022: saccular infrarenal aneurysm measuring up to 5.1 cm   Long term current use of  anticoagulant    a.) rivaroxaban   MVP (mitral valve prolapse)    NICM (nonischemic cardiomyopathy) (HCC)    a.) MPI 11/06/2014: EF 30-45%; b.) TTE 05/18/2020: EF 45%; c.) MPI 08/17/2020: EF 25%; d.) TTE 04/19/2022: EF 30-35%   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   a.) MRI brain 07/03/2008 - tiny nonhemorrhagic infarct LEFT posteral parietal lobe   Persistent atrial fibrillation (HCC)    a.) CHA2DS2VASc = 5 (age x2, HFrEF, HTN, vascular disease history);  b.) rate/rhythm maintained on oral digoxin + metoprolol succinate; chronically anticoagulated with rivaroxaban   Psoriasis    Reflux    Sleep apnea    a.) does not utilize nocturnal PAP therapy   Vitamin B 12 deficiency     Past Surgical History:  Procedure Laterality Date   CATARACT EXTRACTION, BILATERAL     COLONOSCOPY WITH PROPOFOL     CYSTOSCOPY WITH LITHOLAPAXY N/A 11/01/2018   Procedure: CYSTOSCOPY WITH LITHOLAPAXY;  Surgeon: [name], MD;  Location: [facility] ORS;  Service: Urology;  Laterality: N/A;   ENDOVASCULAR REPAIR/STENT GRAFT N/A 05/18/2022   Procedure: ENDOVASCULAR REPAIR/STENT GRAFT;  Surgeon: [name], MD;  Location: [facility] INVASIVE CV LAB;  Service: Cardiovascular;  Laterality: N/A;   HOLEP-LASER ENUCLEATION OF THE PROSTATE WITH MORCELLATION N/A 11/01/2018   Procedure: HOLEP-LASER ENUCLEATION OF THE PROSTATE WITH MORCELLATION;  Surgeon: [name], MD;  Location: [facility] ORS;  Service: Urology;  Laterality: N/A;   RIGHT/LEFT HEART CATH AND CORONARY ANGIOGRAPHY N/A 04/20/2022   Procedure: RIGHT/LEFT HEART CATH AND CORONARY ANGIOGRAPHY;  Surgeon: [name], MD;  Location: [facility] INVASIVE CV LAB;  Service: Cardiovascular;  Laterality: N/A;   TONSILLECTOMY     UMBILICAL HERNIA REPAIR     VARICOSE VEIN SURGERY      Current Medications: Current Meds  Medication Sig   atorvastatin (LIPITOR) 40 MG tablet Take 1 tablet (40 mg total) by mouth at bedtime.   Biotin 16109 MCG TABS Take 1 tablet by mouth  daily at 6 (six) AM.   Cyanocobalamin (VITAMIN B-12 PO) Take 1 tablet by mouth daily at 6 (six) AM.   dapagliflozin propanediol (FARXIGA) 10 MG TABS tablet Take 1 tablet (10 mg total) by mouth daily.   digoxin (LANOXIN) 0.25 MG tablet TAKE 1 TABLET BY MOUTH EVERY DAY   diltiazem (CARDIZEM CD) 120 MG 24 hr capsule Take 120 mg by mouth daily.   hydroxypropyl methylcellulose / hypromellose (ISOPTO TEARS / GONIOVISC) 2.5 % ophthalmic solution Place 1 drop into both eyes 3 (three) times daily.   [DISCONTINUED] losartan (COZAAR) 25 MG tablet Take 25 mg by mouth daily.   [DISCONTINUED] metoprolol succinate (TOPROL-XL) 50 MG 24 hr tablet Take 1 tablet (50 mg total) by mouth 2 (two) times daily. Take with or immediately following a meal.   [DISCONTINUED] rivaroxaban (XARELTO) 20 MG TABS tablet TAKE 1 TABLET EVERY DAY  WITH SUPPER. He can proceed with AAA intervention at the discretion of vascular surgery.  Rivaroxaban should be held for 2 days before the procedure and restarted as soon as it is safe to do so.     Allergies:   Other   Social History   Socioeconomic History   Marital status: Divorced    Spouse name: Not on file   Number of children: Not on file   Years of education: Not on file   Highest education level: Not on file  Occupational History   Not on file  Tobacco Use   Smoking status: Never   Smokeless tobacco: Never  Vaping Use   Vaping Use: Never used  Substance and Sexual Activity   Alcohol use: Yes    Alcohol/week: 1.0 standard drink of alcohol    Types: 1 Glasses of wine per week    Comment: social    Drug use: No   Sexual activity: Not Currently  Other Topics Concern   Not on file  Social History Narrative   Not on file   Social Determinants of Health   Financial Resource Strain: Not on file  Food Insecurity: No Food Insecurity (05/21/2022)   Hunger Vital Sign    Worried About Running Out of Food in the Last Year: Never true    Ran Out of Food in the Last Year:  Never true  Transportation Needs: No Transportation Needs (05/21/2022)   PRAPARE - Administrator, Civil Service (Medical): No    Lack of Transportation (Non-Medical): No  Physical Activity: Not on file  Stress: Not on file  Social Connections: Not on file     Family History: The patient's family history includes Heart attack in his father; Hyperlipidemia in his father; Hypertension in his father.  ROS:   Please see the history of present illness.     All other systems reviewed and are negative.  EKGs/Labs/Other Studies Reviewed:    The following studies were reviewed today:  Cardiac cath 04/2022     RPDA lesion is 100% stenosed.   1st Diag lesion is 40% stenosed.   Mid LAD to Dist LAD lesion is 30% stenosed.   1.  Heavily calcified coronary arteries with mild to moderate nonobstructive coronary artery disease with the exception of an occluded proximal right PDA with left-to-right collaterals. 2.  Left ventricular angiography was not performed.  EF was moderately to severely reduced by echo. 3.  Right heart catheterization showed mildly elevated left-sided filling pressures, mild pulmonary hypertension and normal cardiac output.   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 His coronary arteries are heavily calcified but with no obstructive disease and does not require revascularization.  Recommend aggressive medical therapy.\ I increased the dose of Toprol for better rate control of atrial fibrillation. Heparin can be resumed at 6 PM and can be transition back to Xarelto if no plans for AAA repair during this admission. The patient is considered at moderate risk from a cardiac standpoint.    Echo 04/19/22  1. Left ventricular ejection fraction, by estimation, is 30 to 35%. The  left ventricle has moderate to severely decreased function. The left  ventricle demonstrates global hypokinesis. There is mild left ventricular   hypertrophy. Left ventricular  diastolic parameters are indeterminate.   2. Right ventricular systolic function is mildly reduced. The right  ventricular size is moderately enlarged. There is mildly elevated  pulmonary artery systolic pressure.   3. Right atrial size was severely dilated.  4. The mitral valve is normal in structure. Mild mitral valve  regurgitation.   5. Tricuspid valve regurgitation is moderate.   6. The aortic valve is tricuspid. Aortic valve regurgitation is mild.  Aortic valve sclerosis/calcification is present, without any evidence of  aortic stenosis.   7. Aortic dilatation noted. There is mild dilatation of the aortic root,  measuring 40 mm.   8. The inferior vena cava is normal in size with <50% respiratory  variability, suggesting right atrial pressure of 8 mmHg.   EKG:  EKG is ordered today.  The ekg ordered today demonstrates A-fib, 96 bpm, nonspecific ST and T wave changes  Recent Labs: 02/08/2022: TSH 1.540 04/19/2022: Magnesium 2.0 05/20/2022: BUN 24; Creatinine, Ser 0.78; Potassium 3.6; Sodium 138 05/21/2022: ALT 16; B Natriuretic Peptide 318.1; Hemoglobin 12.2; Platelets 146  Recent Lipid Panel No results found for: "CHOL", "TRIG", "HDL", "CHOLHDL", "VLDL", "LDLCALC", "LDLDIRECT"  Physical Exam:    VS:  BP 110/66 (BP Location: Left Arm, Patient Position: Sitting, Cuff Size: Normal)   Pulse 96   Ht 6\' 1"  (1.854 m)   Wt 177 lb (80.3 kg)   SpO2 99%   BMI 23.35 kg/m     Wt Readings from Last 3 Encounters:  06/01/22 177 lb (80.3 kg)  05/23/22 176 lb 5.9 oz (80 kg)  05/18/22 186 lb (84.4 kg)     GEN:  Well nourished, well developed in no acute distress HEENT: Normal NECK: No JVD; No carotid bruits LYMPHATICS: No lymphadenopathy CARDIAC: RRR, no murmurs, rubs, gallops RESPIRATORY:  Clear to auscultation without rales, wheezing or rhonchi  ABDOMEN: Soft, non-tender, non-distended MUSCULOSKELETAL:  No edema; No deformity  SKIN: Warm and  dry NEUROLOGIC:  Alert and oriented x 3 PSYCHIATRIC:  Normal affect   ASSESSMENT:    1. Permanent atrial fibrillation (HCC)   2. Chronic systolic heart failure (HCC)   3. NICM (nonischemic cardiomyopathy) (HCC)   4. Coronary artery disease involving native coronary artery of native heart without angina pectoris   5. Abdominal aortic aneurysm (AAA) without rupture, unspecified part (HCC)    PLAN:    In order of problems listed above:  Permanent Afib Patient is in rate controlled Afib with heart rate of 96bpm. Continue Xarelto for stroke ppx. Continue diltiazem 120 grams daily and Toprol 50mg  BID for rate control.  Will send in 90-day prescription for Xarelto and Toprol.  NICM Chronic systolic heart failure Patient is euvolemic on exam today.  Patient is taking Farxiga 10 mg daily and Toprol 50 mg twice daily.  During last hospitalization losartan was stopped, however, was subsequently restarted, and he has been taking it for the last 1 to 2 weeks.  Blood pressure is normal today, however he reports dizziness while walking, limiting titration of guideline directed medical therapy.  I will order a limited echo to be rechecked in 6 weeks.  We will follow-up in 2 months.  The patient is still planning on getting a second opinion from [facility] next week.  CAD Recent cardiac cath showed heavily calcified coronary arteries with mild to moderate nonobstructive CAD and occluded proximal right PDA with left-to-right collaterals.  Medical therapy was recommended.  The patient denies chest pain or shortness of breath.  No aspirin given Xarelto.  Patient is doing PT at home.  The patient will go to [facility] for second opinion.  Continue Lipitor and Toprol.  AAA S/p endovascular repair on 5/2/244 with vascular surgery.  Disposition: Follow up in 2 month(s) with Md/APP  Signed, Ana Liaw David Stall, PA-C  06/01/2022 11:48 AM    [facility]

## 2022-06-02 ENCOUNTER — Other Ambulatory Visit: Payer: Self-pay | Admitting: Medical

## 2022-06-03 DIAGNOSIS — G4733 Obstructive sleep apnea (adult) (pediatric): Secondary | ICD-10-CM | POA: Diagnosis not present

## 2022-06-06 DIAGNOSIS — G4700 Insomnia, unspecified: Secondary | ICD-10-CM | POA: Insufficient documentation

## 2022-06-06 DIAGNOSIS — F0280 Dementia in other diseases classified elsewhere without behavioral disturbance: Secondary | ICD-10-CM | POA: Diagnosis not present

## 2022-06-06 DIAGNOSIS — Z09 Encounter for follow-up examination after completed treatment for conditions other than malignant neoplasm: Secondary | ICD-10-CM | POA: Diagnosis not present

## 2022-06-06 DIAGNOSIS — G309 Alzheimer's disease, unspecified: Secondary | ICD-10-CM | POA: Diagnosis not present

## 2022-06-07 DIAGNOSIS — I5020 Unspecified systolic (congestive) heart failure: Secondary | ICD-10-CM | POA: Diagnosis not present

## 2022-06-07 DIAGNOSIS — I25118 Atherosclerotic heart disease of native coronary artery with other forms of angina pectoris: Secondary | ICD-10-CM | POA: Diagnosis not present

## 2022-06-07 DIAGNOSIS — H04123 Dry eye syndrome of bilateral lacrimal glands: Secondary | ICD-10-CM | POA: Diagnosis not present

## 2022-06-07 DIAGNOSIS — I482 Chronic atrial fibrillation, unspecified: Secondary | ICD-10-CM | POA: Diagnosis not present

## 2022-06-07 DIAGNOSIS — R9431 Abnormal electrocardiogram [ECG] [EKG]: Secondary | ICD-10-CM | POA: Diagnosis not present

## 2022-06-07 DIAGNOSIS — I517 Cardiomegaly: Secondary | ICD-10-CM | POA: Diagnosis not present

## 2022-06-07 DIAGNOSIS — I4891 Unspecified atrial fibrillation: Secondary | ICD-10-CM | POA: Diagnosis not present

## 2022-06-07 DIAGNOSIS — I2582 Chronic total occlusion of coronary artery: Secondary | ICD-10-CM | POA: Diagnosis not present

## 2022-06-07 DIAGNOSIS — H18592 Other hereditary corneal dystrophies, left eye: Secondary | ICD-10-CM | POA: Diagnosis not present

## 2022-06-07 NOTE — Addendum Note (Signed)
Addended by: Ihor Dow on: 06/07/2022 01:41 PM   Modules accepted: Orders

## 2022-06-13 DIAGNOSIS — H18592 Other hereditary corneal dystrophies, left eye: Secondary | ICD-10-CM | POA: Diagnosis not present

## 2022-06-13 DIAGNOSIS — H04123 Dry eye syndrome of bilateral lacrimal glands: Secondary | ICD-10-CM | POA: Diagnosis not present

## 2022-06-17 DIAGNOSIS — I5020 Unspecified systolic (congestive) heart failure: Secondary | ICD-10-CM | POA: Insufficient documentation

## 2022-06-26 ENCOUNTER — Other Ambulatory Visit: Payer: Self-pay | Admitting: Cardiovascular Disease

## 2022-06-26 NOTE — Telephone Encounter (Signed)
Pt last seen in office on 5/16. Per Cadence, pt to continue diltiazem 120 mg daily.  Refill request to pharmacy as requested.

## 2022-06-26 NOTE — Telephone Encounter (Signed)
Please advise if ok to refill historical provider. 

## 2022-06-28 DIAGNOSIS — H18522 Epithelial (juvenile) corneal dystrophy, left eye: Secondary | ICD-10-CM | POA: Diagnosis not present

## 2022-06-28 DIAGNOSIS — H04123 Dry eye syndrome of bilateral lacrimal glands: Secondary | ICD-10-CM | POA: Diagnosis not present

## 2022-06-28 DIAGNOSIS — H18521 Epithelial (juvenile) corneal dystrophy, right eye: Secondary | ICD-10-CM | POA: Diagnosis not present

## 2022-07-04 DIAGNOSIS — G4733 Obstructive sleep apnea (adult) (pediatric): Secondary | ICD-10-CM | POA: Diagnosis not present

## 2022-07-11 DIAGNOSIS — G629 Polyneuropathy, unspecified: Secondary | ICD-10-CM | POA: Diagnosis not present

## 2022-07-11 DIAGNOSIS — G479 Sleep disorder, unspecified: Secondary | ICD-10-CM | POA: Diagnosis not present

## 2022-07-11 DIAGNOSIS — R202 Paresthesia of skin: Secondary | ICD-10-CM | POA: Diagnosis not present

## 2022-07-11 DIAGNOSIS — R251 Tremor, unspecified: Secondary | ICD-10-CM | POA: Diagnosis not present

## 2022-07-11 DIAGNOSIS — F03A0 Unspecified dementia, mild, without behavioral disturbance, psychotic disturbance, mood disturbance, and anxiety: Secondary | ICD-10-CM | POA: Diagnosis not present

## 2022-07-11 DIAGNOSIS — R2689 Other abnormalities of gait and mobility: Secondary | ICD-10-CM | POA: Diagnosis not present

## 2022-07-11 DIAGNOSIS — R200 Anesthesia of skin: Secondary | ICD-10-CM | POA: Diagnosis not present

## 2022-07-11 DIAGNOSIS — R454 Irritability and anger: Secondary | ICD-10-CM | POA: Diagnosis not present

## 2022-07-11 DIAGNOSIS — Z8673 Personal history of transient ischemic attack (TIA), and cerebral infarction without residual deficits: Secondary | ICD-10-CM | POA: Diagnosis not present

## 2022-07-13 DIAGNOSIS — E7800 Pure hypercholesterolemia, unspecified: Secondary | ICD-10-CM | POA: Diagnosis not present

## 2022-07-13 DIAGNOSIS — I110 Hypertensive heart disease with heart failure: Secondary | ICD-10-CM | POA: Diagnosis not present

## 2022-07-13 DIAGNOSIS — F03918 Unspecified dementia, unspecified severity, with other behavioral disturbance: Secondary | ICD-10-CM | POA: Diagnosis not present

## 2022-07-13 DIAGNOSIS — I4891 Unspecified atrial fibrillation: Secondary | ICD-10-CM | POA: Diagnosis not present

## 2022-07-13 DIAGNOSIS — I4821 Permanent atrial fibrillation: Secondary | ICD-10-CM | POA: Diagnosis not present

## 2022-07-13 DIAGNOSIS — I5020 Unspecified systolic (congestive) heart failure: Secondary | ICD-10-CM | POA: Diagnosis not present

## 2022-07-13 DIAGNOSIS — G4733 Obstructive sleep apnea (adult) (pediatric): Secondary | ICD-10-CM | POA: Diagnosis not present

## 2022-07-13 DIAGNOSIS — Z8673 Personal history of transient ischemic attack (TIA), and cerebral infarction without residual deficits: Secondary | ICD-10-CM | POA: Diagnosis not present

## 2022-07-13 DIAGNOSIS — R4182 Altered mental status, unspecified: Secondary | ICD-10-CM | POA: Diagnosis not present

## 2022-07-13 DIAGNOSIS — Z7901 Long term (current) use of anticoagulants: Secondary | ICD-10-CM | POA: Diagnosis not present

## 2022-07-13 DIAGNOSIS — I714 Abdominal aortic aneurysm, without rupture, unspecified: Secondary | ICD-10-CM | POA: Diagnosis not present

## 2022-07-13 DIAGNOSIS — M6281 Muscle weakness (generalized): Secondary | ICD-10-CM | POA: Diagnosis not present

## 2022-07-21 ENCOUNTER — Other Ambulatory Visit: Payer: Medicare HMO

## 2022-07-24 DIAGNOSIS — I5022 Chronic systolic (congestive) heart failure: Secondary | ICD-10-CM | POA: Diagnosis not present

## 2022-07-24 DIAGNOSIS — I5020 Unspecified systolic (congestive) heart failure: Secondary | ICD-10-CM | POA: Diagnosis not present

## 2022-07-24 DIAGNOSIS — I4891 Unspecified atrial fibrillation: Secondary | ICD-10-CM | POA: Diagnosis not present

## 2022-07-25 DIAGNOSIS — H6123 Impacted cerumen, bilateral: Secondary | ICD-10-CM | POA: Diagnosis not present

## 2022-07-25 DIAGNOSIS — G309 Alzheimer's disease, unspecified: Secondary | ICD-10-CM | POA: Diagnosis not present

## 2022-07-25 DIAGNOSIS — F0280 Dementia in other diseases classified elsewhere without behavioral disturbance: Secondary | ICD-10-CM | POA: Diagnosis not present

## 2022-07-25 DIAGNOSIS — F330 Major depressive disorder, recurrent, mild: Secondary | ICD-10-CM | POA: Diagnosis not present

## 2022-07-26 DIAGNOSIS — B359 Dermatophytosis, unspecified: Secondary | ICD-10-CM | POA: Diagnosis not present

## 2022-07-26 DIAGNOSIS — L570 Actinic keratosis: Secondary | ICD-10-CM | POA: Diagnosis not present

## 2022-07-26 DIAGNOSIS — L820 Inflamed seborrheic keratosis: Secondary | ICD-10-CM | POA: Diagnosis not present

## 2022-07-26 DIAGNOSIS — D2261 Melanocytic nevi of right upper limb, including shoulder: Secondary | ICD-10-CM | POA: Diagnosis not present

## 2022-07-26 DIAGNOSIS — Z85828 Personal history of other malignant neoplasm of skin: Secondary | ICD-10-CM | POA: Diagnosis not present

## 2022-07-26 DIAGNOSIS — D225 Melanocytic nevi of trunk: Secondary | ICD-10-CM | POA: Diagnosis not present

## 2022-07-26 DIAGNOSIS — D2272 Melanocytic nevi of left lower limb, including hip: Secondary | ICD-10-CM | POA: Diagnosis not present

## 2022-07-26 DIAGNOSIS — D485 Neoplasm of uncertain behavior of skin: Secondary | ICD-10-CM | POA: Diagnosis not present

## 2022-07-26 DIAGNOSIS — D2262 Melanocytic nevi of left upper limb, including shoulder: Secondary | ICD-10-CM | POA: Diagnosis not present

## 2022-08-01 ENCOUNTER — Ambulatory Visit: Payer: Medicare HMO | Admitting: Medical

## 2022-08-03 DIAGNOSIS — G4733 Obstructive sleep apnea (adult) (pediatric): Secondary | ICD-10-CM | POA: Diagnosis not present

## 2022-08-07 DIAGNOSIS — E7800 Pure hypercholesterolemia, unspecified: Secondary | ICD-10-CM | POA: Diagnosis not present

## 2022-08-07 DIAGNOSIS — I5020 Unspecified systolic (congestive) heart failure: Secondary | ICD-10-CM | POA: Diagnosis not present

## 2022-08-07 DIAGNOSIS — R4182 Altered mental status, unspecified: Secondary | ICD-10-CM | POA: Diagnosis not present

## 2022-08-07 DIAGNOSIS — I714 Abdominal aortic aneurysm, without rupture, unspecified: Secondary | ICD-10-CM | POA: Diagnosis not present

## 2022-08-07 DIAGNOSIS — I4821 Permanent atrial fibrillation: Secondary | ICD-10-CM | POA: Diagnosis not present

## 2022-08-07 DIAGNOSIS — I110 Hypertensive heart disease with heart failure: Secondary | ICD-10-CM | POA: Diagnosis not present

## 2022-08-07 DIAGNOSIS — F03918 Unspecified dementia, unspecified severity, with other behavioral disturbance: Secondary | ICD-10-CM | POA: Diagnosis not present

## 2022-08-14 ENCOUNTER — Ambulatory Visit: Payer: Medicare HMO | Admitting: Podiatry

## 2022-08-16 ENCOUNTER — Ambulatory Visit: Payer: Medicare HMO | Admitting: Podiatry

## 2022-08-16 ENCOUNTER — Ambulatory Visit (INDEPENDENT_AMBULATORY_CARE_PROVIDER_SITE_OTHER): Payer: Medicare HMO

## 2022-08-16 ENCOUNTER — Encounter: Payer: Self-pay | Admitting: Podiatry

## 2022-08-16 DIAGNOSIS — M79674 Pain in right toe(s): Secondary | ICD-10-CM | POA: Diagnosis not present

## 2022-08-16 DIAGNOSIS — M779 Enthesopathy, unspecified: Secondary | ICD-10-CM

## 2022-08-16 DIAGNOSIS — M79675 Pain in left toe(s): Secondary | ICD-10-CM | POA: Diagnosis not present

## 2022-08-16 DIAGNOSIS — M21071 Valgus deformity, not elsewhere classified, right ankle: Secondary | ICD-10-CM

## 2022-08-16 DIAGNOSIS — B351 Tinea unguium: Secondary | ICD-10-CM | POA: Diagnosis not present

## 2022-08-16 DIAGNOSIS — M21761 Unequal limb length (acquired), right tibia: Secondary | ICD-10-CM | POA: Diagnosis not present

## 2022-08-16 DIAGNOSIS — M2141 Flat foot [pes planus] (acquired), right foot: Secondary | ICD-10-CM | POA: Diagnosis not present

## 2022-08-16 NOTE — Patient Instructions (Signed)
[facility] in [place] can see him for his varicose veins, please let me know if he needs a referral for this

## 2022-08-16 NOTE — Progress Notes (Signed)
  Subjective:  Patient ID: Gary Dawson, male    DOB: [birth year],  MRN: [id number]  Chief Complaint  Patient presents with   Foot Pain    "I'm having pain in my ankle." N - ankle pain L - right lateral D - 10 yrs O - gradually worse C - sore A - walking T - inserts in shoes, wrapped it   Nail Problem    "He needs his toenails trimmed." N - toenails L - bilateral 1-5 D - 1 year or more O - gradually worse C - thick, discolored, hard to cut A - socks T - He cut the big toenails about 45 days ago.    82 y.o. male presents with the above complaint. History confirmed with patient.  Nails are thickened elongated he and his caregivers cannot cut them.  His right foot is severely flattened and collapses in this affecting his knee and gait  Objective:  Physical Exam: warm, good capillary refill, no trophic changes or ulcerative lesions, normal DP and PT pulses, normal sensory exam, and right foot severe pes planovalgus deformity with limited range of motion of subtalar joint and valgus position, he has a 2 cm limb length discrepancy with the left leg shorter than the right, he has thickened elongated yellow dystrophic toenails x 10   Multiple views x-ray of the right foot:  Moderate hindfoot and midfoot degenerative changes with severe pes planovalgus, ankle alignment is maintained Assessment:   1. Pes planus of right foot   2. 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 Pain due to onychomycosis of toenails of both feet   4. Valgus deformity of foot, right      Plan:  Patient was evaluated and treated and all questions answered.  Discussed the etiology and treatment options for the condition in detail with the patient.  I doubt that topical treatment will be effective and he is not a good candidate for oral therapy.  Recommended debridement of the nails today. Sharp and mechanical debridement performed of all painful and mycotic nails today. Nails debrided in length  and thickness using a nail nipper to level of comfort. Discussed treatment options including appropriate shoe gear. Follow up as needed for painful nails.  Regarding his severe deformity I do think he would benefit from bracing and a lift for his left side.  I recommended a Richie brace to be fashioned for the right side for ambulation with a footplate and ankle brace, as well as a 2 cm lift on his shoe on the left side, they are going to work on getting him new shoes prior to having this done at [facility].  They will schedule this appointment and the prescription was provided to them in person today.   Return in about 3 months (around 11/16/2022) for painful thick fungal nails.

## 2022-08-22 DIAGNOSIS — G4733 Obstructive sleep apnea (adult) (pediatric): Secondary | ICD-10-CM | POA: Diagnosis not present

## 2022-08-22 DIAGNOSIS — H903 Sensorineural hearing loss, bilateral: Secondary | ICD-10-CM | POA: Diagnosis not present

## 2022-08-22 DIAGNOSIS — H6123 Impacted cerumen, bilateral: Secondary | ICD-10-CM | POA: Diagnosis not present

## 2022-08-22 DIAGNOSIS — J301 Allergic rhinitis due to pollen: Secondary | ICD-10-CM | POA: Diagnosis not present

## 2022-08-22 DIAGNOSIS — J300 Vasomotor rhinitis: Secondary | ICD-10-CM | POA: Diagnosis not present

## 2022-09-03 DIAGNOSIS — G4733 Obstructive sleep apnea (adult) (pediatric): Secondary | ICD-10-CM | POA: Diagnosis not present

## 2022-09-27 DIAGNOSIS — H18521 Epithelial (juvenile) corneal dystrophy, right eye: Secondary | ICD-10-CM | POA: Diagnosis not present

## 2022-09-27 DIAGNOSIS — H04123 Dry eye syndrome of bilateral lacrimal glands: Secondary | ICD-10-CM | POA: Diagnosis not present

## 2022-09-27 DIAGNOSIS — H18522 Epithelial (juvenile) corneal dystrophy, left eye: Secondary | ICD-10-CM | POA: Diagnosis not present

## 2022-10-04 DIAGNOSIS — G4733 Obstructive sleep apnea (adult) (pediatric): Secondary | ICD-10-CM | POA: Diagnosis not present

## 2022-10-06 ENCOUNTER — Ambulatory Visit (INDEPENDENT_AMBULATORY_CARE_PROVIDER_SITE_OTHER): Payer: Medicare HMO | Admitting: Podiatrist

## 2022-10-06 DIAGNOSIS — B351 Tinea unguium: Secondary | ICD-10-CM

## 2022-10-06 DIAGNOSIS — M79674 Pain in right toe(s): Secondary | ICD-10-CM

## 2022-10-06 DIAGNOSIS — Z111 Encounter for screening for respiratory tuberculosis: Secondary | ICD-10-CM | POA: Diagnosis not present

## 2022-10-06 DIAGNOSIS — Z23 Encounter for immunization: Secondary | ICD-10-CM | POA: Diagnosis not present

## 2022-10-06 DIAGNOSIS — M79675 Pain in left toe(s): Secondary | ICD-10-CM

## 2022-10-06 NOTE — Patient Instructions (Signed)

## 2022-10-06 NOTE — Progress Notes (Unsigned)
Patient presents today for the 1st laser treatment. Diagnosed with mycotic nail infection by Dr. Lilian Kapur.   Toenail most affected right 1st toenail with milder changes to lesser digits.  All other systems are negative.  Nails were filed thin. Laser therapy was administered to bilateral toenails 1-5 and patient tolerated the treatment well. All safety precautions were in place.   Single laser pass was done on non-affected nails.   Follow up in 4 weeks for laser # 2.
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
## 2022-10-11 DIAGNOSIS — H18521 Epithelial (juvenile) corneal dystrophy, right eye: Secondary | ICD-10-CM | POA: Diagnosis not present

## 2022-10-16 ENCOUNTER — Encounter: Payer: Self-pay | Admitting: Psychiatry

## 2022-10-16 ENCOUNTER — Ambulatory Visit: Payer: Medicare HMO | Admitting: Psychiatry

## 2022-10-16 VITALS — BP 118/76 | HR 67 | Temp 98.3°F | Ht 73.0 in | Wt 177.6 lb

## 2022-10-16 DIAGNOSIS — G4700 Insomnia, unspecified: Secondary | ICD-10-CM | POA: Diagnosis not present

## 2022-10-16 DIAGNOSIS — R419 Unspecified symptoms and signs involving cognitive functions and awareness: Secondary | ICD-10-CM | POA: Diagnosis not present

## 2022-10-16 DIAGNOSIS — F320 Major depressive disorder, single episode, mild: Secondary | ICD-10-CM | POA: Diagnosis not present

## 2022-10-16 MED ORDER — MIRTAZAPINE 30 MG PO TABS: 30.0000 mg | ORAL_TABLET | Freq: Every day | ORAL | 1 refills | Status: DC

## 2022-10-16 NOTE — Patient Instructions (Addendum)
Increase mirtazapine 30 mg at night  Discontinue buspar  Continue Trazodone 50 mg at night as needed for insomnia Next appointment: 11/19 at 10 am Lab- check folate, vitamin B12, TSH

## 2022-10-20 DIAGNOSIS — H18521 Epithelial (juvenile) corneal dystrophy, right eye: Secondary | ICD-10-CM | POA: Diagnosis not present

## 2022-10-20 DIAGNOSIS — H04123 Dry eye syndrome of bilateral lacrimal glands: Secondary | ICD-10-CM | POA: Diagnosis not present

## 2022-10-23 ENCOUNTER — Ambulatory Visit: Payer: Medicare HMO | Admitting: Gastroenterology

## 2022-10-31 ENCOUNTER — Ambulatory Visit: Payer: Medicare HMO | Admitting: Physician Assistant

## 2022-10-31 NOTE — Progress Notes (Deleted)
Celso Amy, PA-C [address]  [address]  [place], [place] [address]  Main: [phone]  Fax: [phone]   Gastroenterology Consultation  Referring Provider:     Gracelyn Nurse, MD Primary Care Physician:  Gracelyn Nurse, MD Primary Gastroenterologist:  *** Reason for Consultation:     Black stool        HPI:   Terral Glaves is a 82 y.o. y/o male referred for consultation & management  by Gracelyn Nurse, MD.    Self-referred?  Medical history significant for Alzheimer's dementia, chronic systolic CHF, hypertension, CAD, sleep apnea, GERD, hx of permanent A-fib on Xarelto, CVA, AAA 5.1 cm s/p repair .  Labs 07/24/2022: Hemoglobin 13.3, hematocrit 39, MCV 100, normal iron 163.  Platelets 183.  BUN 26, creatinine 1.0, normal LFTs.  I reviewed epic and care everywhere records.  I was unable to find any previous colonoscopy or EGD reports.  Past Medical History:  Diagnosis Date   Acute on chronic HFrEF (heart failure with reduced ejection fraction) (HCC)    a.) TTE 05/18/2020: EF 45%, glob HK, mild LVH, mod reduced RVSF, mild RVE, mod LAE, sev RAE, mod MAC, triv MR, sev TR, mild-mod AoV sclerosis, (+) IAS (L-R); b.) TTE 04/19/2022: EF 30-35%, glob HK, LVH, mod RVE, PASP 40.3, mod TR, mild AR; c.) [facility] 04/20/2022: mRA 8, mPA 27, mPCWP 20, LVEDP 13, CO 4.97, CI 2.34   Aortic atherosclerosis (HCC)    Aortic root dilatation (HCC)    a.) TTE 05/18/2020: 42 mm; b.) TTE 04/19/2022: 40 mm   Atypical chest pain    a. 03/2012 St echo: nl EF, no wma's.   BPH with obstruction/lower urinary tract symptoms    CAD (coronary artery disease)    a.) R/LHC 04/20/2022: 100% RPDA, 40% D1, 30% m-dLAD - med mgmt   Calculus in bladder    Chronic prostatitis    Depression    Elevated PSA    Essential hypertension    History of kidney stones    Hyperlipidemia    Infrarenal abdominal aortic aneurysm (AAA) without rupture (HCC)    a.) CT abd/pelvis 04/18/2022: saccular  infrarenal aneurysm measuring up to 5.1 cm   Long term current use of anticoagulant    a.) rivaroxaban   MVP (mitral valve prolapse)    NICM (nonischemic cardiomyopathy) (HCC)    a.) MPI 11/06/2014: EF 30-45%; b.) TTE 05/18/2020: EF 45%; c.) MPI 08/17/2020: EF 25%; d.) TTE 04/19/2022: EF 30-35%   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   a.) MRI brain 07/03/2008 - tiny nonhemorrhagic infarct LEFT posteral parietal lobe   Persistent atrial fibrillation (HCC)    a.) CHA2DS2VASc = 5 (age x2, HFrEF, HTN, vascular disease history);  b.) rate/rhythm maintained on oral digoxin + metoprolol succinate; chronically anticoagulated with rivaroxaban   Psoriasis    Reflux    Sleep apnea    a.) does not utilize nocturnal PAP therapy   Vitamin B 12 deficiency     Past Surgical History:  Procedure Laterality Date   CATARACT EXTRACTION, BILATERAL     COLONOSCOPY WITH PROPOFOL     CYSTOSCOPY WITH LITHOLAPAXY N/A 11/01/2018   Procedure: CYSTOSCOPY WITH LITHOLAPAXY;  Surgeon: [name], MD;  Location: [facility] OR[facility];  Service: Urology;  Laterality: N/A;   ENDOVASCULAR REPAIR/STENT GRAFT N/A 05/18/2022   Procedure: ENDOVASCULAR REPAIR/STENT GRAFT;  Surgeon: [name], MD;  Location: [facility];  Service: Cardiovascular;  Laterality: N/A;   HOLEP-LASER ENUCLEATION  OF THE PROSTATE WITH MORCELLATION N/A 11/01/2018   Procedure: HOLEP-LASER ENUCLEATION OF THE PROSTATE WITH MORCELLATION;  Surgeon: [name], MD;  Location: [facility];  Service: Urology;  Laterality: N/A;   RIGHT/LEFT HEART CATH AND CORONARY ANGIOGRAPHY N/A 04/20/2022   Procedure: RIGHT/LEFT HEART CATH AND CORONARY ANGIOGRAPHY;  Surgeon: [name], MD;  Location: [facility];  Service: Cardiovascular;  Laterality: N/A;   TONSILLECTOMY     UMBILICAL HERNIA REPAIR     VARICOSE VEIN SURGERY      Prior to Admission medications   Medication Sig Start Date End Date Taking? Authorizing Provider  atorvastatin  (LIPITOR) 40 MG tablet Take 1 tablet (40 mg total) by mouth at bedtime. 05/29/22   Furth, Cadence H, PA-C  Cyanocobalamin (VITAMIN B-12 PO) Take 1 tablet by mouth daily at 6 (six) AM.    [provider]  digoxin (LANOXIN) 0.25 MG tablet TAKE 1 TABLET BY MOUTH EVERY DAY 05/31/22   Antonieta Iba, MD  diltiazem (CARDIZEM CD) 120 MG 24 hr capsule TAKE 1 CAPSULE (120 MG) BY MOUTH ONCE DAILY AT NIGHT 06/26/22   Furth, Cadence H, PA-C  hydroxypropyl methylcellulose / hypromellose (ISOPTO TEARS / GONIOVISC) 2.5 % ophthalmic solution Place 1 drop into both eyes 3 (three) times daily.    [provider]  losartan (COZAAR) 25 MG tablet Take 1 tablet (25 mg total) by mouth daily. 06/01/22   Furth, Cadence H, PA-C  metoprolol succinate (TOPROL-XL) 50 MG 24 hr tablet Take 1 tablet (50 mg total) by mouth 2 (two) times daily. Take with or immediately following a meal. 06/01/22   Furth, Cadence H, PA-C  mirtazapine (REMERON) 15 MG tablet Take 15 mg by mouth at bedtime. 09/29/22   [provider]  mirtazapine (REMERON) 30 MG tablet Take 1 tablet (30 mg total) by mouth at bedtime. 10/16/22 12/15/22  Neysa Hotter, MD  spironolactone (ALDACTONE) 25 MG tablet Take 1 tablet by mouth daily. 08/31/22 08/31/23  [provider]  traZODone (DESYREL) 50 MG tablet Take 50 mg by mouth at bedtime as needed for sleep.    [provider]    Family History  Problem Relation Age of Onset   Heart attack Father    Hypertension Father    Hyperlipidemia Father      Social History   Tobacco Use   Smoking status: Never   Smokeless tobacco: Never  Vaping Use   Vaping status: Never Used  Substance Use Topics   Alcohol use: Not Currently    Alcohol/week: 1.0 standard drink of alcohol    Types: 1 Glasses of wine per week    Comment: social    Drug use: No    Allergies as of 10/31/2022 - Review Complete 10/16/2022  Allergen Reaction Noted   Other Other (See Comments) April 19, 1940     Review of Systems:    All systems reviewed and negative except where noted in HPI.   Physical Exam:  There were no vitals taken for this visit. No LMP for male patient.  General:   Alert,  Well-developed, well-nourished, pleasant and cooperative in NAD Lungs:  Respirations even and unlabored.  Clear throughout to auscultation.   No wheezes, crackles, or rhonchi. No acute distress. Heart:  Regular rate and rhythm; no murmurs, clicks, rubs, or gallops. Abdomen:  Normal bowel sounds.  No bruits.  Soft, and non-distended without masses, hepatosplenomegaly or hernias noted.  No Tenderness.  No guarding or rebound tenderness.    Neurologic:  Alert and oriented x3;  grossly normal neurologically. Psych:  Alert and cooperative. Normal mood and affect.  Imaging Studies: No results found.  Assessment and Plan:   Chiron Umsted is a 82 y.o. y/o male has been referred for ***  Follow up ***  Celso Amy, PA-C    BP check ***

## 2022-11-03 DIAGNOSIS — G4733 Obstructive sleep apnea (adult) (pediatric): Secondary | ICD-10-CM | POA: Diagnosis not present

## 2022-11-07 ENCOUNTER — Other Ambulatory Visit: Payer: Self-pay | Admitting: Psychiatry

## 2022-11-09 ENCOUNTER — Ambulatory Visit: Payer: Medicare HMO | Admitting: Gastroenterology

## 2022-11-13 ENCOUNTER — Ambulatory Visit (INDEPENDENT_AMBULATORY_CARE_PROVIDER_SITE_OTHER): Payer: Medicare HMO | Admitting: Podiatry

## 2022-11-13 DIAGNOSIS — Z91199 Patient's noncompliance with other medical treatment and regimen due to unspecified reason: Secondary | ICD-10-CM

## 2022-11-13 NOTE — Progress Notes (Signed)
1. No-show for appointment    It seems patient was seen in [place] office last month. No charge.

## 2022-11-30 ENCOUNTER — Telehealth: Payer: Self-pay | Admitting: Psychiatry

## 2022-11-30 NOTE — Telephone Encounter (Signed)
Noted, thanks!

## 2022-11-30 NOTE — Telephone Encounter (Signed)
Patient significant other, Jody T, states 3 weeks ago he is now in assisted living and will not be attending the appointment for next week. Cancelled per the requestf

## 2022-12-01 ENCOUNTER — Other Ambulatory Visit: Payer: Medicare HMO

## 2022-12-05 ENCOUNTER — Ambulatory Visit: Payer: Medicare HMO | Admitting: Psychiatry

## 2022-12-05 DIAGNOSIS — Z7409 Other reduced mobility: Secondary | ICD-10-CM | POA: Diagnosis not present

## 2022-12-05 DIAGNOSIS — M6281 Muscle weakness (generalized): Secondary | ICD-10-CM | POA: Diagnosis not present

## 2022-12-05 DIAGNOSIS — R2689 Other abnormalities of gait and mobility: Secondary | ICD-10-CM | POA: Diagnosis not present

## 2022-12-05 DIAGNOSIS — Z9181 History of falling: Secondary | ICD-10-CM | POA: Diagnosis not present

## 2022-12-13 DIAGNOSIS — R2689 Other abnormalities of gait and mobility: Secondary | ICD-10-CM | POA: Diagnosis not present

## 2022-12-13 DIAGNOSIS — Z9181 History of falling: Secondary | ICD-10-CM | POA: Diagnosis not present

## 2022-12-13 DIAGNOSIS — Z7409 Other reduced mobility: Secondary | ICD-10-CM | POA: Diagnosis not present

## 2022-12-13 DIAGNOSIS — M6281 Muscle weakness (generalized): Secondary | ICD-10-CM | POA: Diagnosis not present

## 2022-12-19 DIAGNOSIS — R2689 Other abnormalities of gait and mobility: Secondary | ICD-10-CM | POA: Diagnosis not present

## 2022-12-19 DIAGNOSIS — Z789 Other specified health status: Secondary | ICD-10-CM | POA: Diagnosis not present

## 2022-12-19 DIAGNOSIS — Z9181 History of falling: Secondary | ICD-10-CM | POA: Diagnosis not present

## 2022-12-19 DIAGNOSIS — Z7409 Other reduced mobility: Secondary | ICD-10-CM | POA: Diagnosis not present

## 2022-12-19 DIAGNOSIS — M6281 Muscle weakness (generalized): Secondary | ICD-10-CM | POA: Diagnosis not present

## 2022-12-21 DIAGNOSIS — M6281 Muscle weakness (generalized): Secondary | ICD-10-CM | POA: Diagnosis not present

## 2022-12-21 DIAGNOSIS — Z7409 Other reduced mobility: Secondary | ICD-10-CM | POA: Diagnosis not present

## 2022-12-21 DIAGNOSIS — Z789 Other specified health status: Secondary | ICD-10-CM | POA: Diagnosis not present

## 2022-12-21 DIAGNOSIS — Z9181 History of falling: Secondary | ICD-10-CM | POA: Diagnosis not present

## 2022-12-21 DIAGNOSIS — R2689 Other abnormalities of gait and mobility: Secondary | ICD-10-CM | POA: Diagnosis not present

## 2022-12-26 DIAGNOSIS — Z7409 Other reduced mobility: Secondary | ICD-10-CM | POA: Diagnosis not present

## 2022-12-26 DIAGNOSIS — R2689 Other abnormalities of gait and mobility: Secondary | ICD-10-CM | POA: Diagnosis not present

## 2022-12-26 DIAGNOSIS — Z9181 History of falling: Secondary | ICD-10-CM | POA: Diagnosis not present

## 2022-12-26 DIAGNOSIS — M6281 Muscle weakness (generalized): Secondary | ICD-10-CM | POA: Diagnosis not present

## 2022-12-26 DIAGNOSIS — Z789 Other specified health status: Secondary | ICD-10-CM | POA: Diagnosis not present

## 2022-12-28 DIAGNOSIS — R2689 Other abnormalities of gait and mobility: Secondary | ICD-10-CM | POA: Diagnosis not present

## 2022-12-28 DIAGNOSIS — Z9181 History of falling: Secondary | ICD-10-CM | POA: Diagnosis not present

## 2022-12-28 DIAGNOSIS — Z789 Other specified health status: Secondary | ICD-10-CM | POA: Diagnosis not present

## 2022-12-28 DIAGNOSIS — M6281 Muscle weakness (generalized): Secondary | ICD-10-CM | POA: Diagnosis not present

## 2022-12-28 DIAGNOSIS — Z7409 Other reduced mobility: Secondary | ICD-10-CM | POA: Diagnosis not present

## 2023-01-02 DIAGNOSIS — M6281 Muscle weakness (generalized): Secondary | ICD-10-CM | POA: Diagnosis not present

## 2023-01-02 DIAGNOSIS — Z9181 History of falling: Secondary | ICD-10-CM | POA: Diagnosis not present

## 2023-01-02 DIAGNOSIS — Z789 Other specified health status: Secondary | ICD-10-CM | POA: Diagnosis not present

## 2023-01-02 DIAGNOSIS — R2689 Other abnormalities of gait and mobility: Secondary | ICD-10-CM | POA: Diagnosis not present

## 2023-01-02 DIAGNOSIS — Z7409 Other reduced mobility: Secondary | ICD-10-CM | POA: Diagnosis not present

## 2023-01-04 DIAGNOSIS — M6281 Muscle weakness (generalized): Secondary | ICD-10-CM | POA: Diagnosis not present

## 2023-01-04 DIAGNOSIS — Z7409 Other reduced mobility: Secondary | ICD-10-CM | POA: Diagnosis not present

## 2023-01-04 DIAGNOSIS — Z789 Other specified health status: Secondary | ICD-10-CM | POA: Diagnosis not present

## 2023-01-04 DIAGNOSIS — R2689 Other abnormalities of gait and mobility: Secondary | ICD-10-CM | POA: Diagnosis not present

## 2023-01-04 DIAGNOSIS — Z9181 History of falling: Secondary | ICD-10-CM | POA: Diagnosis not present

## 2023-01-16 DIAGNOSIS — Z789 Other specified health status: Secondary | ICD-10-CM | POA: Diagnosis not present

## 2023-01-16 DIAGNOSIS — M6281 Muscle weakness (generalized): Secondary | ICD-10-CM | POA: Diagnosis not present

## 2023-01-16 DIAGNOSIS — Z7409 Other reduced mobility: Secondary | ICD-10-CM | POA: Diagnosis not present

## 2023-01-16 DIAGNOSIS — Z9181 History of falling: Secondary | ICD-10-CM | POA: Diagnosis not present

## 2023-01-16 DIAGNOSIS — R2689 Other abnormalities of gait and mobility: Secondary | ICD-10-CM | POA: Diagnosis not present

## 2023-02-01 ENCOUNTER — Other Ambulatory Visit: Payer: Self-pay | Admitting: Psychiatry

## 2023-03-07 ENCOUNTER — Other Ambulatory Visit: Payer: Self-pay | Admitting: Cardiovascular Disease

## 2023-03-27 NOTE — Progress Notes (Deleted)
 03/29/2023 9:28 PM   Gary Dawson Dec 25, 1940 [id number]  Referring provider: Gracelyn Nurse, MD [address] [facility] [place],  [place] [address]  Urological history: 1. BPH w/ LU TS -HoLEP (2020) -pathology benign  2. Nocturia -Risk factors for nocturia: obstructive sleep apnea, hypertension, arthritis, cognitive dysfunction, alcohol consumption and BPH  3. OAB -Contributing factors of age, obstructive sleep apnea, hypertension, cognitive dysfunction, BPH, diuretics and alcohol consumption  No chief complaint on file.  HPI: Gary Dawson is a 83 y.o. male who presents today for one year follow up.    Previous records reviewed.     I PSS ***  PVR ***    Score:  1-7 Mild 8-19 Moderate 20-35 Severe    PMH: Past Medical History:  Diagnosis Date   Acute on chronic HFrEF (heart failure with reduced ejection fraction) (HCC)    a.) TTE 05/18/2020: EF 45%, glob HK, mild LVH, mod reduced RVSF, mild RVE, mod LAE, sev RAE, mod MAC, triv MR, sev TR, mild-mod AoV sclerosis, (+) IAS (L-R); b.) TTE 04/19/2022: EF 30-35%, glob HK, LVH, mod RVE, PASP 40.3, mod TR, mild AR; c.) [facility] 04/20/2022: mRA 8, mPA 27, mPCWP 20, LVEDP 13, CO 4.97, CI 2.34   Aortic atherosclerosis (HCC)    Aortic root dilatation (HCC)    a.) TTE 05/18/2020: 42 mm; b.) TTE 04/19/2022: 40 mm   Atypical chest pain    a. 03/2012 St echo: nl EF, no wma's.   BPH with obstruction/lower urinary tract symptoms    CAD (coronary artery disease)    a.) R/LHC 04/20/2022: 100% RPDA, 40% D1, 30% m-dLAD - med mgmt   Calculus in bladder    Chronic prostatitis    Depression    Elevated PSA    Essential hypertension    History of kidney stones    Hyperlipidemia    Infrarenal abdominal aortic aneurysm (AAA) without rupture (HCC)    a.) CT abd/pelvis 04/18/2022: saccular infrarenal aneurysm measuring up to 5.1 cm   Long term current use of anticoagulant    a.) rivaroxaban   MVP  (mitral valve prolapse)    NICM (nonischemic cardiomyopathy) (HCC)    a.) MPI 11/06/2014: EF 30-45%; b.) TTE 05/18/2020: EF 45%; c.) MPI 08/17/2020: EF 25%; d.) TTE 04/19/2022: EF 30-35%   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   a.) MRI brain 07/03/2008 - tiny nonhemorrhagic infarct LEFT posteral parietal lobe   Persistent atrial fibrillation (HCC)    a.) CHA2DS2VASc = 5 (age x2, HFrEF, HTN, vascular disease history);  b.) rate/rhythm maintained on oral digoxin + metoprolol succinate; chronically anticoagulated with rivaroxaban   Psoriasis    Reflux    Sleep apnea    a.) does not utilize nocturnal PAP therapy   Vitamin B 12 deficiency     Surgical History: Past Surgical History:  Procedure Laterality Date   CATARACT EXTRACTION, BILATERAL     COLONOSCOPY WITH PROPOFOL     CYSTOSCOPY WITH LITHOLAPAXY N/A 11/01/2018   Procedure: CYSTOSCOPY WITH LITHOLAPAXY;  Surgeon: [name], MD;  Location: [facility] ORS;  Service: Urology;  Laterality: N/A;   ENDOVASCULAR REPAIR/STENT GRAFT N/A 05/18/2022   Procedure: ENDOVASCULAR REPAIR/STENT GRAFT;  Surgeon: [name], MD;  Location: [facility];  Service: Cardiovascular;  Laterality: N/A;   HOLEP-LASER ENUCLEATION OF THE PROSTATE WITH MORCELLATION N/A 11/01/2018   Procedure: HOLEP-LASER ENUCLEATION OF THE PROSTATE WITH MORCELLATION;  Surgeon: [name], MD;  Location: [facility] OR[facility];  Service:  Urology;  Laterality: N/A;   RIGHT/LEFT HEART CATH AND CORONARY ANGIOGRAPHY N/A 04/20/2022   Procedure: RIGHT/LEFT HEART CATH AND CORONARY ANGIOGRAPHY;  Surgeon: [name], MD;  Location: [facility];  Service: Cardiovascular;  Laterality: N/A;   TONSILLECTOMY     UMBILICAL HERNIA REPAIR     VARICOSE VEIN SURGERY      Home Medications:  Allergies as of 03/29/2023       Reactions   Other Other (See Comments)   Trees and grasses        Medication List        Accurate as of March 27, 2023  9:28 PM. If you have any  questions, ask your nurse or doctor.          atorvastatin 40 MG tablet Commonly known as: LIPITOR Take 1 tablet (40 mg total) by mouth at bedtime.   digoxin 0.25 MG tablet Commonly known as: LANOXIN TAKE 1 TABLET BY MOUTH EVERY DAY   diltiazem 120 MG 24 hr capsule Commonly known as: CARDIZEM CD TAKE 1 CAPSULE (120 MG) BY MOUTH ONCE DAILY AT NIGHT   hydroxypropyl methylcellulose / hypromellose 2.5 % ophthalmic solution Commonly known as: ISOPTO TEARS / GONIOVISC Place 1 drop into both eyes 3 (three) times daily.   losartan 25 MG tablet Commonly known as: COZAAR Take 1 tablet (25 mg total) by mouth daily.   metoprolol succinate 50 MG 24 hr tablet Commonly known as: TOPROL-XL Take 1 tablet (50 mg total) by mouth 2 (two) times daily. Take with or immediately following a meal.   mirtazapine 15 MG tablet Commonly known as: REMERON Take 15 mg by mouth at bedtime.   mirtazapine 30 MG tablet Commonly known as: REMERON Take 1 tablet (30 mg total) by mouth at bedtime.   spironolactone 25 MG tablet Commonly known as: ALDACTONE Take 1 tablet by mouth daily.   traZODone 50 MG tablet Commonly known as: DESYREL Take 50 mg by mouth at bedtime as needed for sleep.   VITAMIN B-12 PO Take 1 tablet by mouth daily at 6 (six) AM.        Allergies:  Allergies  Allergen Reactions   Other Other (See Comments)    Trees and grasses     Family History: Family History  Problem Relation Age of Onset   Heart attack Father    Hypertension Father    Hyperlipidemia Father     Social History:  reports that he has never smoked. He has never used smokeless tobacco. He reports that he does not currently use alcohol after a past usage of about 1.0 standard drink of alcohol per week. He reports that he does not use drugs.  ROS: Pertinent ROS in HPI  Physical Exam: There were no vitals taken for this visit.  Constitutional:  Well nourished. Alert and oriented, No acute  distress. HEENT: [place] AT, moist mucus membranes.  Trachea midline, no masses. Cardiovascular: No clubbing, cyanosis, or edema. Respiratory: Normal respiratory effort, no increased work of breathing. GI: Abdomen is soft, non tender, non distended, no abdominal masses. Liver and spleen not palpable.  No hernias appreciated.  Stool sample for occult testing is not indicated.   GU: No CVA tenderness.  No bladder fullness or masses.  Patient with circumcised/uncircumcised phallus. ***Foreskin easily retracted***  Urethral meatus is patent.  No penile discharge. No penile lesions or rashes. Scrotum without lesions, cysts, rashes and/or edema.  Testicles are located scrotally bilaterally. No masses are appreciated in the testicles.  Left and right epididymis are normal. Rectal: Patient with  normal sphincter tone. Anus and perineum without scarring or rashes. No rectal masses are appreciated. Prostate is approximately *** grams, *** nodules are appreciated. Seminal vesicles are normal. Skin: No rashes, bruises or suspicious lesions. Lymph: No cervical or inguinal adenopathy. Neurologic: Grossly intact, no focal deficits, moving all 4 extremities. Psychiatric: Normal mood and affect.   Laboratory Data: Basic Metabolic Panel (BMP) Order: [id number] Component Ref Range & Units 8 mo ago  Sodium 135 - 145 mmol/L 139  Potassium 3.5 - 5.0 mmol/L 4.7  Comment: SPECIMEN HEMOLYZED, INTERFERENCE CAUSED BY THIS DEGREE OF HEMOLYSIS MAY AFFECT THE FINAL RESULT. INTERPRET RESULT WITH CAUTION.  Chloride 98 - 108 mmol/L 106  Carbon Dioxide (CO2) 21 - 30 mmol/L 25  Urea Nitrogen (BUN) 7 - 20 mg/dL 26 High   Creatinine 0.6 - 1.3 mg/dL 1.0  Glucose 70 - 829 mg/dL 97  Comment: Interpretive Data: Above is the NONFASTING reference range.  Below are the FASTING reference ranges: NORMAL:      70-99 mg/dL PREDIABETES: 562-130 mg/dL DIABETES:    > 865 mg/dL  Calcium 8.7 - 78.4 mg/dL 9.0  Anion Gap 3 - 12 mmol/L  8  BUN/CREA Ratio 6 - 27 26  Glomerular Filtration Rate (eGFR) mL/min/1.73sq m 76  Comment: CKD-EPI (2021) does not include patient's race in the calculation of eGFR. Monitoring changes of plasma creatinine and eGFR over time is useful for monitoring kidney function.  This change was made on 03/16/2020.  Interpretive Ranges for eGFR(CKD-EPI 2021):  eGFR:              > 60 mL/min/1.73 sq m - Normal eGFR:              30 - 59 mL/min/1.73 sq m - Moderately Decreased eGFR:              15 - 29 mL/min/1.73 sq m - Severely Decreased eGFR:              < 15 mL/min/1.73 sq m -  Kidney Failure   Note: These eGFR calculations do not apply in acute situations when eGFR is changing rapidly or in patients on dialysis.  Resulting Agency [facility]   Specimen Collected: 07/24/22 13:12   Performed by: [facility] Last Resulted: 07/24/22 14:05  Received From: [facility]  Result Received: 07/25/22 10:42   CBC    Component Value Date/Time   WBC 8.9 05/21/2022 1035   RBC 3.69 (L) 05/21/2022 1035   HGB 12.2 (L) 05/21/2022 1035   HCT 36.4 (L) 05/21/2022 1035   PLT 146 (L) 05/21/2022 1035   MCV 98.6 05/21/2022 1035   MCH 33.1 05/21/2022 1035   MCHC 33.5 05/21/2022 1035   RDW 13.3 05/21/2022 1035   LYMPHSABS 1.1 05/20/2022 1850   MONOABS 1.5 (H) 05/20/2022 1850   EOSABS 0.0 05/20/2022 1850   BASOSABS 0.0 05/20/2022 1850  I have reviewed the labs.  See HPI.     Pertinent Imaging: ***   Assessment & Plan:    1. BPH with LUTS -PVR < 300 cc  -continue conservative management, avoiding bladder irritants and timed voiding's  2. OAB -He believes it is the new depends that is controlling his bladder symptoms as he does not want to continue the Myrbetriq -I sent a prescription in for the Myrbetriq regardless as the symptoms are probably returning in a few weeks and he will want to  restart the medicine  No follow-ups on file.  These  notes generated with voice recognition software. I apologize for typographical errors.  Cloretta Ned  [facility] [address]  [address] [place], [place] [address] [phone]

## 2023-03-29 ENCOUNTER — Ambulatory Visit: Payer: Self-pay | Admitting: Urology

## 2023-03-29 DIAGNOSIS — N138 Other obstructive and reflux uropathy: Secondary | ICD-10-CM

## 2023-03-29 DIAGNOSIS — N3281 Overactive bladder: Secondary | ICD-10-CM
